# Patient Record
Sex: Female | Born: 1935 | Race: White | Hispanic: No | Smoking: Never smoker
Health system: Southern US, Community
[De-identification: ages and names within clinical notes are randomized; demographics above are authoritative.]

## PROBLEM LIST (undated history)

## (undated) DIAGNOSIS — K5792 Diverticulitis of intestine, part unspecified, without perforation or abscess without bleeding: Secondary | ICD-10-CM

## (undated) DIAGNOSIS — M1990 Unspecified osteoarthritis, unspecified site: Secondary | ICD-10-CM

## (undated) DIAGNOSIS — Z9889 Other specified postprocedural states: Secondary | ICD-10-CM

## (undated) DIAGNOSIS — C801 Malignant (primary) neoplasm, unspecified: Secondary | ICD-10-CM

## (undated) DIAGNOSIS — F32A Depression, unspecified: Secondary | ICD-10-CM

## (undated) DIAGNOSIS — I639 Cerebral infarction, unspecified: Secondary | ICD-10-CM

## (undated) DIAGNOSIS — D0510 Intraductal carcinoma in situ of unspecified breast: Secondary | ICD-10-CM

## (undated) DIAGNOSIS — E039 Hypothyroidism, unspecified: Secondary | ICD-10-CM

## (undated) DIAGNOSIS — K219 Gastro-esophageal reflux disease without esophagitis: Secondary | ICD-10-CM

## (undated) DIAGNOSIS — I10 Essential (primary) hypertension: Secondary | ICD-10-CM

## (undated) DIAGNOSIS — F419 Anxiety disorder, unspecified: Secondary | ICD-10-CM

## (undated) DIAGNOSIS — K589 Irritable bowel syndrome without diarrhea: Secondary | ICD-10-CM

## (undated) DIAGNOSIS — H9190 Unspecified hearing loss, unspecified ear: Secondary | ICD-10-CM

## (undated) DIAGNOSIS — H269 Unspecified cataract: Secondary | ICD-10-CM

## (undated) DIAGNOSIS — E785 Hyperlipidemia, unspecified: Secondary | ICD-10-CM

## (undated) DIAGNOSIS — R197 Diarrhea, unspecified: Secondary | ICD-10-CM

## (undated) DIAGNOSIS — J45909 Unspecified asthma, uncomplicated: Secondary | ICD-10-CM

## (undated) DIAGNOSIS — F329 Major depressive disorder, single episode, unspecified: Secondary | ICD-10-CM

## (undated) HISTORY — DX: Essential (primary) hypertension: I10

## (undated) HISTORY — PX: OTHER SURGICAL HISTORY: SHX169

## (undated) HISTORY — PX: COLECTOMY: SHX59

## (undated) HISTORY — DX: Irritable bowel syndrome, unspecified: K58.9

## (undated) HISTORY — DX: Other specified postprocedural states: Z98.890

## (undated) HISTORY — DX: Unspecified cataract: H26.9

## (undated) HISTORY — PX: JOINT REPLACEMENT: SHX530

## (undated) HISTORY — DX: Diverticulitis of intestine, part unspecified, without perforation or abscess without bleeding: K57.92

## (undated) HISTORY — DX: Unspecified asthma, uncomplicated: J45.909

## (undated) HISTORY — DX: Diarrhea, unspecified: R19.7

## (undated) HISTORY — PX: CHOLECYSTECTOMY: SHX55

## (undated) HISTORY — PX: KNEE SURGERY: SHX244

## (undated) HISTORY — DX: Intraductal carcinoma in situ of unspecified breast: D05.10

## (undated) HISTORY — DX: Hyperlipidemia, unspecified: E78.5

## (undated) HISTORY — DX: Gastro-esophageal reflux disease without esophagitis: K21.9

---

## 1981-05-29 HISTORY — PX: ABDOMINAL HYSTERECTOMY: SHX81

## 2000-09-11 ENCOUNTER — Encounter: Payer: Self-pay | Admitting: Internal Medicine

## 2000-09-11 ENCOUNTER — Ambulatory Visit (HOSPITAL_COMMUNITY): Admission: RE | Admit: 2000-09-11 | Discharge: 2000-09-11 | Payer: Self-pay | Admitting: Internal Medicine

## 2000-11-21 ENCOUNTER — Encounter: Payer: Self-pay | Admitting: Neurosurgery

## 2000-11-21 ENCOUNTER — Encounter: Admission: RE | Admit: 2000-11-21 | Discharge: 2000-11-21 | Payer: Self-pay | Admitting: Neurosurgery

## 2000-12-06 ENCOUNTER — Encounter: Admission: RE | Admit: 2000-12-06 | Discharge: 2000-12-06 | Payer: Self-pay | Admitting: Neurosurgery

## 2000-12-06 ENCOUNTER — Encounter: Payer: Self-pay | Admitting: Neurosurgery

## 2000-12-20 ENCOUNTER — Encounter: Payer: Self-pay | Admitting: Neurosurgery

## 2000-12-20 ENCOUNTER — Encounter: Admission: RE | Admit: 2000-12-20 | Discharge: 2000-12-20 | Payer: Self-pay | Admitting: Neurosurgery

## 2001-01-26 ENCOUNTER — Emergency Department (HOSPITAL_COMMUNITY): Admission: EM | Admit: 2001-01-26 | Discharge: 2001-01-26 | Payer: Self-pay | Admitting: Emergency Medicine

## 2001-01-26 ENCOUNTER — Encounter: Payer: Self-pay | Admitting: Emergency Medicine

## 2001-02-01 ENCOUNTER — Encounter: Admission: RE | Admit: 2001-02-01 | Discharge: 2001-02-01 | Payer: Self-pay | Admitting: Neurosurgery

## 2001-02-01 ENCOUNTER — Encounter: Payer: Self-pay | Admitting: Neurosurgery

## 2001-02-25 ENCOUNTER — Encounter: Payer: Self-pay | Admitting: Internal Medicine

## 2001-02-25 ENCOUNTER — Ambulatory Visit (HOSPITAL_COMMUNITY): Admission: RE | Admit: 2001-02-25 | Discharge: 2001-02-25 | Payer: Self-pay | Admitting: Internal Medicine

## 2001-03-06 ENCOUNTER — Observation Stay (HOSPITAL_COMMUNITY): Admission: RE | Admit: 2001-03-06 | Discharge: 2001-03-07 | Payer: Self-pay | Admitting: General Surgery

## 2001-03-28 ENCOUNTER — Encounter: Admission: RE | Admit: 2001-03-28 | Discharge: 2001-06-26 | Payer: Self-pay | Admitting: Internal Medicine

## 2001-05-29 DIAGNOSIS — Z9889 Other specified postprocedural states: Secondary | ICD-10-CM

## 2001-05-29 HISTORY — DX: Other specified postprocedural states: Z98.890

## 2001-09-11 ENCOUNTER — Ambulatory Visit (HOSPITAL_COMMUNITY): Admission: RE | Admit: 2001-09-11 | Discharge: 2001-09-11 | Payer: Self-pay | Admitting: Internal Medicine

## 2001-09-11 HISTORY — PX: COLONOSCOPY: SHX174

## 2001-09-25 ENCOUNTER — Ambulatory Visit (HOSPITAL_COMMUNITY): Admission: RE | Admit: 2001-09-25 | Discharge: 2001-09-25 | Payer: Self-pay | Admitting: Internal Medicine

## 2001-09-25 ENCOUNTER — Encounter: Payer: Self-pay | Admitting: Internal Medicine

## 2001-11-22 ENCOUNTER — Encounter: Payer: Self-pay | Admitting: Internal Medicine

## 2001-11-22 ENCOUNTER — Ambulatory Visit (HOSPITAL_COMMUNITY): Admission: RE | Admit: 2001-11-22 | Discharge: 2001-11-22 | Payer: Self-pay | Admitting: Internal Medicine

## 2002-01-03 ENCOUNTER — Encounter: Admission: RE | Admit: 2002-01-03 | Discharge: 2002-01-03 | Payer: Self-pay | Admitting: Internal Medicine

## 2002-01-03 ENCOUNTER — Encounter: Payer: Self-pay | Admitting: Internal Medicine

## 2002-01-24 ENCOUNTER — Ambulatory Visit (HOSPITAL_COMMUNITY): Admission: RE | Admit: 2002-01-24 | Discharge: 2002-01-24 | Payer: Self-pay | Admitting: Neurosurgery

## 2002-01-24 ENCOUNTER — Encounter: Payer: Self-pay | Admitting: Neurosurgery

## 2002-03-28 ENCOUNTER — Ambulatory Visit (HOSPITAL_COMMUNITY): Admission: RE | Admit: 2002-03-28 | Discharge: 2002-03-28 | Payer: Self-pay | Admitting: Internal Medicine

## 2002-03-28 ENCOUNTER — Encounter: Payer: Self-pay | Admitting: Internal Medicine

## 2002-05-29 HISTORY — PX: KNEE ARTHROPLASTY: SHX992

## 2002-09-11 ENCOUNTER — Encounter (HOSPITAL_COMMUNITY): Admission: RE | Admit: 2002-09-11 | Discharge: 2002-10-11 | Payer: Self-pay | Admitting: Internal Medicine

## 2002-09-11 ENCOUNTER — Encounter: Payer: Self-pay | Admitting: Internal Medicine

## 2002-10-06 ENCOUNTER — Encounter: Payer: Self-pay | Admitting: Internal Medicine

## 2002-12-25 ENCOUNTER — Ambulatory Visit (HOSPITAL_COMMUNITY): Admission: RE | Admit: 2002-12-25 | Discharge: 2002-12-25 | Payer: Self-pay | Admitting: Internal Medicine

## 2002-12-25 ENCOUNTER — Encounter: Payer: Self-pay | Admitting: Internal Medicine

## 2003-10-08 ENCOUNTER — Ambulatory Visit (HOSPITAL_COMMUNITY): Admission: RE | Admit: 2003-10-08 | Discharge: 2003-10-08 | Payer: Self-pay | Admitting: Internal Medicine

## 2003-11-04 ENCOUNTER — Ambulatory Visit (HOSPITAL_COMMUNITY): Admission: RE | Admit: 2003-11-04 | Discharge: 2003-11-04 | Payer: Self-pay | Admitting: Orthopedic Surgery

## 2003-11-04 ENCOUNTER — Ambulatory Visit (HOSPITAL_BASED_OUTPATIENT_CLINIC_OR_DEPARTMENT_OTHER): Admission: RE | Admit: 2003-11-04 | Discharge: 2003-11-04 | Payer: Self-pay | Admitting: Orthopedic Surgery

## 2004-06-12 ENCOUNTER — Emergency Department (HOSPITAL_COMMUNITY): Admission: EM | Admit: 2004-06-12 | Discharge: 2004-06-12 | Payer: Self-pay | Admitting: *Deleted

## 2004-10-10 ENCOUNTER — Ambulatory Visit (HOSPITAL_COMMUNITY): Admission: RE | Admit: 2004-10-10 | Discharge: 2004-10-10 | Payer: Self-pay | Admitting: Internal Medicine

## 2004-11-28 ENCOUNTER — Encounter: Admission: RE | Admit: 2004-11-28 | Discharge: 2004-11-28 | Payer: Self-pay | Admitting: Orthopedic Surgery

## 2005-01-17 ENCOUNTER — Ambulatory Visit (HOSPITAL_COMMUNITY): Admission: RE | Admit: 2005-01-17 | Discharge: 2005-01-17 | Payer: Self-pay | Admitting: Internal Medicine

## 2005-02-07 ENCOUNTER — Ambulatory Visit (HOSPITAL_COMMUNITY): Admission: RE | Admit: 2005-02-07 | Discharge: 2005-02-07 | Payer: Self-pay | Admitting: Internal Medicine

## 2005-10-03 ENCOUNTER — Ambulatory Visit (HOSPITAL_COMMUNITY): Admission: RE | Admit: 2005-10-03 | Discharge: 2005-10-03 | Payer: Self-pay | Admitting: Neurosurgery

## 2005-10-17 ENCOUNTER — Ambulatory Visit (HOSPITAL_COMMUNITY): Admission: RE | Admit: 2005-10-17 | Discharge: 2005-10-17 | Payer: Self-pay | Admitting: Internal Medicine

## 2005-11-16 ENCOUNTER — Encounter: Admission: RE | Admit: 2005-11-16 | Discharge: 2005-11-16 | Payer: Self-pay | Admitting: Neurosurgery

## 2005-12-08 ENCOUNTER — Encounter: Admission: RE | Admit: 2005-12-08 | Discharge: 2005-12-08 | Payer: Self-pay | Admitting: Neurosurgery

## 2006-01-02 ENCOUNTER — Encounter: Admission: RE | Admit: 2006-01-02 | Discharge: 2006-01-02 | Payer: Self-pay | Admitting: Neurosurgery

## 2006-01-31 ENCOUNTER — Encounter: Admission: RE | Admit: 2006-01-31 | Discharge: 2006-01-31 | Payer: Self-pay | Admitting: Orthopedic Surgery

## 2006-02-13 ENCOUNTER — Encounter (HOSPITAL_COMMUNITY): Admission: RE | Admit: 2006-02-13 | Discharge: 2006-02-23 | Payer: Self-pay | Admitting: Orthopedic Surgery

## 2006-02-26 ENCOUNTER — Encounter (HOSPITAL_COMMUNITY): Admission: RE | Admit: 2006-02-26 | Discharge: 2006-03-28 | Payer: Self-pay | Admitting: Orthopedic Surgery

## 2006-03-29 ENCOUNTER — Encounter (HOSPITAL_COMMUNITY): Admission: RE | Admit: 2006-03-29 | Discharge: 2006-04-28 | Payer: Self-pay | Admitting: Orthopedic Surgery

## 2006-08-10 ENCOUNTER — Observation Stay (HOSPITAL_COMMUNITY): Admission: AD | Admit: 2006-08-10 | Discharge: 2006-08-11 | Payer: Self-pay | Admitting: Internal Medicine

## 2006-08-10 DIAGNOSIS — I639 Cerebral infarction, unspecified: Secondary | ICD-10-CM

## 2006-08-10 HISTORY — DX: Cerebral infarction, unspecified: I63.9

## 2006-08-15 ENCOUNTER — Ambulatory Visit (HOSPITAL_COMMUNITY): Admission: RE | Admit: 2006-08-15 | Discharge: 2006-08-15 | Payer: Self-pay | Admitting: Internal Medicine

## 2006-08-15 ENCOUNTER — Ambulatory Visit: Payer: Self-pay | Admitting: Cardiology

## 2006-08-27 ENCOUNTER — Ambulatory Visit (HOSPITAL_COMMUNITY): Admission: RE | Admit: 2006-08-27 | Discharge: 2006-08-27 | Payer: Self-pay | Admitting: Internal Medicine

## 2006-09-05 ENCOUNTER — Ambulatory Visit (HOSPITAL_COMMUNITY): Admission: RE | Admit: 2006-09-05 | Discharge: 2006-09-05 | Payer: Self-pay | Admitting: Internal Medicine

## 2006-09-11 ENCOUNTER — Encounter (HOSPITAL_COMMUNITY): Admission: RE | Admit: 2006-09-11 | Discharge: 2006-10-11 | Payer: Self-pay | Admitting: Internal Medicine

## 2006-10-19 ENCOUNTER — Ambulatory Visit (HOSPITAL_COMMUNITY): Admission: RE | Admit: 2006-10-19 | Discharge: 2006-10-19 | Payer: Self-pay | Admitting: Internal Medicine

## 2007-03-19 ENCOUNTER — Ambulatory Visit (HOSPITAL_COMMUNITY): Admission: RE | Admit: 2007-03-19 | Discharge: 2007-03-19 | Payer: Self-pay | Admitting: Internal Medicine

## 2007-07-12 ENCOUNTER — Encounter: Admission: RE | Admit: 2007-07-12 | Discharge: 2007-07-12 | Payer: Self-pay | Admitting: Neurosurgery

## 2007-10-23 ENCOUNTER — Ambulatory Visit (HOSPITAL_COMMUNITY): Admission: RE | Admit: 2007-10-23 | Discharge: 2007-10-23 | Payer: Self-pay | Admitting: Internal Medicine

## 2008-06-04 ENCOUNTER — Ambulatory Visit (HOSPITAL_COMMUNITY): Admission: RE | Admit: 2008-06-04 | Discharge: 2008-06-04 | Payer: Self-pay | Admitting: Ophthalmology

## 2008-10-20 ENCOUNTER — Encounter: Admission: RE | Admit: 2008-10-20 | Discharge: 2008-10-20 | Payer: Self-pay | Admitting: Orthopedic Surgery

## 2008-10-22 ENCOUNTER — Ambulatory Visit (HOSPITAL_BASED_OUTPATIENT_CLINIC_OR_DEPARTMENT_OTHER): Admission: RE | Admit: 2008-10-22 | Discharge: 2008-10-22 | Payer: Self-pay | Admitting: Orthopedic Surgery

## 2008-10-23 ENCOUNTER — Ambulatory Visit (HOSPITAL_COMMUNITY): Admission: RE | Admit: 2008-10-23 | Discharge: 2008-10-23 | Payer: Self-pay | Admitting: Internal Medicine

## 2009-08-01 ENCOUNTER — Emergency Department (HOSPITAL_COMMUNITY): Admission: EM | Admit: 2009-08-01 | Discharge: 2009-08-01 | Payer: Self-pay | Admitting: Emergency Medicine

## 2009-08-24 ENCOUNTER — Ambulatory Visit (HOSPITAL_COMMUNITY): Admission: RE | Admit: 2009-08-24 | Discharge: 2009-08-24 | Payer: Self-pay | Admitting: Internal Medicine

## 2009-10-26 ENCOUNTER — Ambulatory Visit (HOSPITAL_COMMUNITY): Admission: RE | Admit: 2009-10-26 | Discharge: 2009-10-26 | Payer: Self-pay | Admitting: Internal Medicine

## 2010-05-29 DIAGNOSIS — Z9889 Other specified postprocedural states: Secondary | ICD-10-CM

## 2010-05-29 HISTORY — DX: Other specified postprocedural states: Z98.890

## 2010-06-19 ENCOUNTER — Encounter: Payer: Self-pay | Admitting: Internal Medicine

## 2010-08-19 LAB — COMPREHENSIVE METABOLIC PANEL
ALT: 25 U/L (ref 0–35)
Albumin: 4 g/dL (ref 3.5–5.2)
Alkaline Phosphatase: 66 U/L (ref 39–117)
Calcium: 9.2 mg/dL (ref 8.4–10.5)
GFR calc Af Amer: >60 mL/min (ref >60)
Potassium: 3.9 mEq/L (ref 3.5–5.1)
Sodium: 140 mEq/L (ref 135–145)
Total Protein: 6.6 g/dL (ref 6.0–8.3)

## 2010-08-19 LAB — CBC
Platelets: 423 10*3/uL — ABNORMAL HIGH (ref 150–400)
RDW: 13.3 % (ref 11.5–15.5)

## 2010-08-19 LAB — DIFFERENTIAL
Basophils Relative: 0 % (ref 0–1)
Eosinophils Absolute: 0.1 10*3/uL (ref 0.0–0.7)
Lymphs Abs: 2.3 10*3/uL (ref 0.7–4.0)
Monocytes Absolute: 0.9 10*3/uL (ref 0.1–1.0)
Monocytes Relative: 12 % (ref 3–12)

## 2010-09-06 LAB — URINALYSIS, ROUTINE W REFLEX MICROSCOPIC
Glucose, UA: NEGATIVE mg/dL
Hgb urine dipstick: NEGATIVE
Protein, ur: NEGATIVE mg/dL
Specific Gravity, Urine: 1.015 (ref 1.005–1.030)
pH: 5.5 (ref 5.0–8.0)

## 2010-09-06 LAB — HEMOGLOBIN A1C
Hgb A1c MFr Bld: 5.7 % (ref 4.6–6.1)
Mean Plasma Glucose: 117 mg/dL

## 2010-09-06 LAB — COMPREHENSIVE METABOLIC PANEL
ALT: 21 U/L (ref 0–35)
CO2: 28 mEq/L (ref 19–32)
Calcium: 9.7 mg/dL (ref 8.4–10.5)
Creatinine, Ser: 1.02 mg/dL (ref 0.4–1.2)
GFR calc Af Amer: >60 mL/min (ref >60)
GFR calc non Af Amer: 53 mL/min — ABNORMAL LOW (ref >60)
Glucose, Bld: 109 mg/dL — ABNORMAL HIGH (ref 70–99)
Sodium: 139 mEq/L (ref 135–145)
Total Protein: 7.1 g/dL (ref 6.0–8.3)

## 2010-09-06 LAB — MICROALBUMIN, URINE: Microalb, Ur: 1.94 mg/dL — ABNORMAL HIGH (ref 0.00–1.89)

## 2010-09-06 LAB — LIPID PANEL
Cholesterol: 175 mg/dL (ref 0–200)
HDL: 60 mg/dL (ref >39)

## 2010-09-06 LAB — TSH: TSH: 1.961 u[IU]/mL (ref 0.350–4.500)

## 2010-09-06 LAB — CBC
Hemoglobin: 13.4 g/dL (ref 12.0–15.0)
MCHC: 33.8 g/dL (ref 30.0–36.0)
MCV: 89.4 fL (ref 78.0–100.0)
RDW: 13.2 % (ref 11.5–15.5)

## 2010-09-06 LAB — POCT HEMOGLOBIN-HEMACUE: Hemoglobin: 13.7 g/dL (ref 12.0–15.0)

## 2010-09-12 LAB — BASIC METABOLIC PANEL WITH GFR
BUN: 19 mg/dL (ref 6–23)
CO2: 22 meq/L (ref 19–32)
Calcium: 9.4 mg/dL (ref 8.4–10.5)
Chloride: 106 meq/L (ref 96–112)
Creatinine, Ser: 0.95 mg/dL (ref 0.4–1.2)
GFR calc non Af Amer: 58 mL/min — ABNORMAL LOW
Glucose, Bld: 185 mg/dL — ABNORMAL HIGH (ref 70–99)
Potassium: 4.3 meq/L (ref 3.5–5.1)
Sodium: 135 meq/L (ref 135–145)

## 2010-09-12 LAB — GLUCOSE, CAPILLARY: Glucose-Capillary: 93 mg/dL (ref 70–99)

## 2010-09-12 LAB — HEMOGLOBIN AND HEMATOCRIT, BLOOD
HCT: 38.3 % (ref 36.0–46.0)
Hemoglobin: 12.7 g/dL (ref 12.0–15.0)

## 2010-09-27 ENCOUNTER — Encounter: Payer: Self-pay | Admitting: Gastroenterology

## 2010-09-27 ENCOUNTER — Ambulatory Visit (INDEPENDENT_AMBULATORY_CARE_PROVIDER_SITE_OTHER): Payer: Medicare Other | Admitting: Gastroenterology

## 2010-09-27 VITALS — BP 123/69 | HR 73 | Temp 97.5°F | Ht 60.0 in | Wt 113.6 lb

## 2010-09-27 DIAGNOSIS — R1013 Epigastric pain: Secondary | ICD-10-CM

## 2010-09-27 DIAGNOSIS — R197 Diarrhea, unspecified: Secondary | ICD-10-CM

## 2010-09-27 DIAGNOSIS — R1110 Vomiting, unspecified: Secondary | ICD-10-CM

## 2010-09-27 MED ORDER — DEXLANSOPRAZOLE 60 MG PO CPDR: 60.0000 mg | DELAYED_RELEASE_CAPSULE | Freq: Every day | ORAL | Status: DC

## 2010-09-27 NOTE — Progress Notes (Signed)
Referring Provider: Dr. Ouida Sills Primary Care Physician:  [name], MD Primary Gastroenterologist:  Dr. Jena Gauss  Chief Complaint  Patient presents with  . Diarrhea    HPI:  Angela Galvan is a 77 y.o. female here as a referral from Dr. Ouida Sills secondary to diarrhea. She actually was last seen by our office in 2004. She has a hx of chronic diarrhea, with a thorough work-up in 2004 to include evaluation via colonoscopy, celiac panel, stool studies, and even SBFT to assess for Crohn's ileitis.  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 Did not tolerate mesalamine enemas. The only thing that has really helped in the past was Librax. She was referred to [facility] ultimately after this extensive work-up, and no further recommendations were reported. It was believed she had diarrhea predominant IBS.  Presents today with bouts of diarrhea. Normally goes once or twice/day, yet when has episodes, 7-8 loose stools. Described as watery. No abdominal pain. Has been experiencing epigastric pain only with eating for a few weeks. +emesis, then feels better. No dysphagia. Almost takes breath away. No BC or Goodys. No NSAIDs. Does take ASA 325 daily.  No loss of appetite, but doesn't eat a lot. NO reflux. Stool samples done at Dr. Alonza Smoker office: lactoferrin positive, no ova or parasites. Do not have other results as of today.    Past Medical History  Diagnosis Date  . Diverticulitis     s/p colectomy in 1991  . IBS (irritable bowel syndrome)   . HTN (hypertension)   . Hyperlipidemia   . S/P colonoscopy 2003    minimal internal hemorrhoids, pancolonic diverticula, biopsy of rectum: lymphoplasmacytic microscopic colitis, colon biopsies normal     Past Surgical History  Procedure Date  . Colectomy   . Cholecystectomy   . Right ovarian tumor removed as teenager   . Abdominal hysterectomy   . Knee surgery   . Bilateral cataracts      Current Outpatient Prescriptions  Medication Sig Dispense Refill  . ALPRAZolam (XANAX) 0.5 MG tablet Take 0.5 mg by mouth at bedtime as needed.        Marland Kitchen aspirin 325 MG tablet Take 325 mg by mouth daily.        Marland Kitchen atorvastatin (LIPITOR) 80 MG tablet Take 80 mg by mouth daily.        . chlordiazePOXIDE (LIBRIUM) 5 MG capsule Take 5 mg by mouth 3 (three) times daily as needed.        . citalopram (CELEXA) 40 MG tablet Take 40 mg by mouth daily.        . diphenoxylate-atropine (LOMOTIL) 2.5-0.025 MG/5ML liquid Take 5 mLs by mouth 4 (four) times daily as needed.        Marland Kitchen levothyroxine (SYNTHROID, LEVOTHROID) 50 MCG tablet Take 50 mcg by mouth daily.        Marland Kitchen losartan (COZAAR) 100 MG tablet Take 100 mg by mouth daily.        Marland Kitchen oxyCODONE-acetaminophen (PERCOCET) 5-325 MG per tablet Take 1 tablet by mouth every 4 (four) hours as needed.        . pregabalin (LYRICA) 75 MG capsule Take 75 mg by mouth 2 (two) times daily.        . traMADol (ULTRAM) 50 MG tablet Take 50 mg by mouth every 6 (six) hours as needed.        Marland Kitchen dexlansoprazole (DEXILANT) 60 MG capsule Take 1 capsule (60 mg total) by mouth daily.  30 capsule  1    Allergies as of 09/27/2010  . (No Known Allergies)    Family History  Problem Relation Age of Onset  . Colon cancer Neg Hx     History   Social History  . Marital Status: Married    Spouse Name: N/A    Number of Children: N/A  . Years of Education: N/A   Occupational History  . Not on file.   Social History Main Topics  . Smoking status: Never Smoker   . Smokeless tobacco: Not on file  . Alcohol Use: No  . Drug Use: No    Review of Systems: Gen: Denies any fever, chills, sweats, anorexia, fatigue, weakness, malaise, weight loss, and sleep disorder CV: Denies chest pain, angina, palpitations, syncope, orthopnea, PND, peripheral edema, and claudication. Resp: Denies dyspnea at rest, dyspnea with exercise, cough, sputum, wheezing, coughing up blood, and  pleurisy. GI: Denies vomiting blood, jaundice, and fecal incontinence.   Denies dysphagia or odynophagia. GU : Denies urinary burning, blood in urine, urinary frequency, urinary hesitancy, nocturnal urination, and urinary incontinence. MS: Denies joint pain, limitation of movement, and swelling, stiffness, low back pain, extremity pain. Denies muscle weakness, cramps, atrophy.  Derm: Denies rash, itching, dry skin, hives, moles, warts, or unhealing ulcers.  Psych: Denies depression, anxiety, memory loss, suicidal ideation, hallucinations, paranoia, and confusion. Heme: Denies bruising, bleeding, and enlarged lymph nodes.  Physical Exam: BP 123/69  Pulse 73  Temp(Src) 97.5 F (36.4 C) (Tympanic)  Ht 5' (1.524 m)  Wt 113 lb 9.6 oz (51.529 kg)  BMI 22.19 kg/m2 General:   Alert,  Well-developed, well-nourished, pleasant and cooperative in NAD Head:  Normocephalic and atraumatic. Eyes:  Sclera clear, no icterus.   Conjunctiva pink. Ears:  Normal auditory acuity. Nose:  No deformity, discharge,  or lesions. Mouth:  No deformity or lesions, dentition normal. Neck:  Supple; no masses or thyromegaly. Lungs:  Clear throughout to auscultation.   No wheezes, crackles, or rhonchi. No acute distress. Heart:  Regular rate and rhythm; no murmurs, clicks, rubs,  or gallops. Abdomen:  Soft, nontender and nondistended. No masses, hepatosplenomegaly or hernias noted. Normal bowel sounds, without guarding, and without rebound.   Rectal:  Deferred until time of colonoscopy.   Msk:  Symmetrical without gross deformities. Normal posture. Extremities:  Without clubbing or edema. Neurologic:  Alert and  oriented x4;  grossly normal neurologically. Skin:  Intact without significant lesions or rashes. Cervical Nodes:  No significant cervical adenopathy. Psych:  Alert and cooperative. Normal mood and affect.

## 2010-09-27 NOTE — Patient Instructions (Addendum)
Begin taking Dexilant 60 mg daily. This is for your upper abdominal discomfort.  Start taking a probiotic daily. You have been given samples of Align. This helps with gas, bloating.  You have been set up for an upper endoscopy and colonoscopy.   We have requested the rest of the stool studies from Dr. Ouida Sills. We may ask you to complete a few more if they did not perform them already. This should not postpone your colonoscopy.

## 2010-09-28 ENCOUNTER — Other Ambulatory Visit (HOSPITAL_COMMUNITY): Payer: Self-pay | Admitting: Internal Medicine

## 2010-09-28 DIAGNOSIS — Z139 Encounter for screening, unspecified: Secondary | ICD-10-CM

## 2010-09-29 ENCOUNTER — Encounter: Payer: Self-pay | Admitting: Gastroenterology

## 2010-09-29 DIAGNOSIS — R1013 Epigastric pain: Secondary | ICD-10-CM | POA: Insufficient documentation

## 2010-09-29 DIAGNOSIS — R197 Diarrhea, unspecified: Secondary | ICD-10-CM | POA: Insufficient documentation

## 2010-09-29 NOTE — Assessment & Plan Note (Addendum)
77 year old female with hx of chronic diarrhea, thoroughly evaluated in past. +hx of She has a hx of lymphoplasmacytic microscopic colitis on rectal biopsies, negative colon biopsies in 2003. Best relief with Librax in the past. Presenting with intermittent episodes of 7-8 loose stools between normal days of 1-2 BMs. No associated abdominal pain. Stool samples thus far received from Dr. Alonza Smoker office nl. Awaiting Cdiff. Highly doubt infectious etiology; likely pt has IBS-D, as this has been thoroughly evaluated in past. However, can't exclude other etiology at this point. Will evaluate with colonoscopy.   Proceed with TCS with Dr. Jena Gauss in near future: the risks, benefits, and alternatives have been discussed with the patient in detail. The patient states understanding and desires to proceed. Probiotic daily (Align samples provided) Further rec's to follow

## 2010-09-29 NOTE — Assessment & Plan Note (Addendum)
77 year old female with several week hx of epigastric pain aggravated by eating, only relieved with emesis. No dysphagia. Feels at times it "takes breath away". No BC, Goodys, NSAIDs. Does take ASA 325 daily. No loss of appetite or reflux. Differentials include gastritis, peptic ulcer disease, will start on Dexilant daily.   EGD with Dr. Jena Gauss in near future (at time of colonoscopy): the R/B/A have been discussed in detail. Pt states understanding and desires to proceed. Dexilant samples provided. Rx provided. Further rec's to follow

## 2010-09-30 NOTE — Progress Notes (Signed)
Cc to PCP 

## 2010-10-05 ENCOUNTER — Encounter: Payer: Self-pay | Admitting: Gastroenterology

## 2010-10-11 ENCOUNTER — Other Ambulatory Visit: Payer: Self-pay | Admitting: Internal Medicine

## 2010-10-11 ENCOUNTER — Ambulatory Visit (HOSPITAL_COMMUNITY)
Admission: RE | Admit: 2010-10-11 | Discharge: 2010-10-11 | Disposition: A | Discharge status: Home / Self Care | Payer: Medicare Other | Source: Ambulatory Visit | Attending: Internal Medicine | Admitting: Internal Medicine

## 2010-10-11 ENCOUNTER — Encounter: Payer: Medicare Other | Admitting: Internal Medicine

## 2010-10-11 DIAGNOSIS — K449 Diaphragmatic hernia without obstruction or gangrene: Secondary | ICD-10-CM | POA: Insufficient documentation

## 2010-10-11 DIAGNOSIS — I10 Essential (primary) hypertension: Secondary | ICD-10-CM | POA: Insufficient documentation

## 2010-10-11 DIAGNOSIS — K5730 Diverticulosis of large intestine without perforation or abscess without bleeding: Secondary | ICD-10-CM

## 2010-10-11 DIAGNOSIS — R197 Diarrhea, unspecified: Secondary | ICD-10-CM

## 2010-10-11 DIAGNOSIS — E785 Hyperlipidemia, unspecified: Secondary | ICD-10-CM | POA: Insufficient documentation

## 2010-10-11 DIAGNOSIS — K2940 Chronic atrophic gastritis without bleeding: Secondary | ICD-10-CM

## 2010-10-11 DIAGNOSIS — R1013 Epigastric pain: Secondary | ICD-10-CM | POA: Insufficient documentation

## 2010-10-11 DIAGNOSIS — Z7982 Long term (current) use of aspirin: Secondary | ICD-10-CM | POA: Insufficient documentation

## 2010-10-11 DIAGNOSIS — Z79899 Other long term (current) drug therapy: Secondary | ICD-10-CM | POA: Insufficient documentation

## 2010-10-11 DIAGNOSIS — K311 Adult hypertrophic pyloric stenosis: Secondary | ICD-10-CM | POA: Insufficient documentation

## 2010-10-11 HISTORY — PX: OTHER SURGICAL HISTORY: SHX169

## 2010-10-11 HISTORY — PX: ESOPHAGOGASTRODUODENOSCOPY: SHX1529

## 2010-10-11 NOTE — Op Note (Signed)
Angela Galvan, Angela Galvan                 ACCOUNT NO.:  [id number]   MEDICAL RECORD NO.:  [id number]          PATIENT TYPE:  AMB   LOCATION:  DSC                          FACILITY:  [facility]   PHYSICIAN:  Cindee Salt, M.D.       DATE OF BIRTH:  08-Feb-1933   DATE OF PROCEDURE:  10/22/2008  DATE OF DISCHARGE:                               OPERATIVE REPORT   PREOPERATIVE DIAGNOSIS:  Stenosing tenosynovitis, left thumb.   POSTOPERATIVE DIAGNOSIS:  Stenosing tenosynovitis, left thumb.   OPERATION:  Release A1 pulley, left thumb.   SURGEON:  Cindee Salt, MD   ANESTHESIA:  Forearm-based IV regional.   ANESTHESIOLOGIST:  Kaylyn Layer. Michelle Piper, MD   HISTORY:  The patient is a 77 year old female with a history of  triggering of her left thumb, this have locked.  She is desirous of  release.  Pre, peri, and postoperative course had been discussed along  with risks and complications.  She is aware that there is no guarantee  with surgery, possibility of infection, recurrence injury to arteries,  nerves, tendons, incomplete relief of symptoms, or dystrophy.  In the  preoperative area, the patient is seen.  The extremity marked by both  the patient and surgeon.  Antibiotic given.   PROCEDURE:  The patient was brought to the operating room where a  forearm-based IV regional anesthetic was carried out without difficulty  under traction of Dr. Michelle Piper.  She was prepped using ChloraPrep, supine  position, left arm free.  A 3-minute dry time was allowed.  A time-out  taken confirming the patient and procedure.  The patient was then  draped.  After adequate anesthesia was afforded to the patient, a  transverse incision was made over the A1 pulley of left thumb, carried  down through subcutaneous tissue, and bleeders were electrocauterized.  The A1 pulley was found be markedly tight with an area of medullization  of the FPL tendon just proximal to this in a locked major.  The A1  pulley was then released allowing  full flexion/extension of the finger.  This was done on its radial aspect protecting neurovascular bundles with  retractors.  Wound was copiously irrigated with saline.  The skin was  then closed with interrupted 5-0 Vicryl Rapide sutures.  The area  locally infiltrated with 0.25% Marcaine without epinephrine.  Sterile  compressive dressing was applied without a splint.  On deflation of the  tourniquet, all fingers immediately pinked.  She was taken to the  recovery room for observation in satisfactory condition.  She will be  discharged home.  To return to the [facility] in 1 week,  on Vicodin.           ______________________________  Cindee Salt, M.D.    GK/MEDQ  D:  10/22/2008  T:  10/23/2008  Job:  [id number]   cc:   Kingsley Callander. Ouida Sills, MD

## 2010-10-14 NOTE — Consult Note (Signed)
[facility]  Patient:    Angela Galvan, Angela Galvan. Visit Number: [id number] MRN: [id number]          Service Type: Attending:  [name], M.D. Dictated by:   Barbaraann Barthel, M.D. Adm. Date:  02/28/01   CC:         [name], M.D.  Barbaraann Barthel, M.D.   Consultation Report  Dear Channing Mutters:  Thank you kindly for sending Angela Galvan back my way.  As you know she has had some rather confusing right upper quadrant and upper abdominal pain since March 2002.  This has been confused because she has had some problems with some thoracic disk herniation which has confused the episode of her pain.  She does, on questioning, have right upper quadrant pain that is at times post prandial and appears to wake her up at night after eating and is accompanied with nausea.  Sonogram, as you know, revealed the presence of a polyp and sludge.   I believe that this is consistent with symptoms of cholecystitis and certainly I think she requires laparoscopic cholecystectomy for the above findings.  We will plan to do this at her convenience next week and I will, as always, make sure you get a copy of the surgical note and the pathology report for your records.  Again, thank you kindly for sending her back my way.  Yours sincerely,   Marlane Hatcher, M.D. Dictated by:   Barbaraann Barthel, M.D. Attending:  [name], M.D. DD:  02/28/01 TD:  03/01/01 Job: [id number]

## 2010-10-14 NOTE — Discharge Summary (Signed)
[name], Angela Galvan                 ACCOUNT NO.:  [id number]   MEDICAL RECORD NO.:  [id number]          PATIENT TYPE:  OBV   LOCATION:  A220                          FACILITY:  [facility]   PHYSICIAN:  Kingsley Callander. Ouida Sills, MD       DATE OF BIRTH:  Dec 04, 1932   DATE OF ADMISSION:  08/10/2006  DATE OF DISCHARGE:  03/15/2008LH                               DISCHARGE SUMMARY   DISCHARGE DIAGNOSES:  1. Transient ischemic attack.  2. Hypertension.  3. Diabetes.  4. Hyperlipidemia.  5. Hypothyroidism.   PROCEDURES:  Head CT.   HOSPITAL COURSE:  This patient is a 77 year old female who presented  with difficulties with balance.  She had no weakness or speech  abnormalities; vital signs were normal.  She was in normal sinus rhythm.  She was hospitalized for observation.  A CT scan of the brain revealed  no acute stroke.  Overnight her balance improved.  Her Romberg had  initially been unsteady but is normal at the time of discharge.  She is  still slightly unsteady with her gait.  It was recommended that she  remain in the hospital for observation, to obtain a followup CT scan in  48 hours.  However, she desired discharge with outpatient followup in 2  days.   Premarin and Celebrex are being stopped.  Aspirin is being increased to  325 mg a day.  She will continue verapamil and pravastatin.   Her glucoses were well controlled.  Her blood pressures were well  controlled.   She had a negative carotid ultrasound last August.   DISCHARGE MEDICATIONS:  1. Verapamil SR 180 mg daily.  2. Aspirin 325 mg daily.  3. Pravastatin 40 mg daily.  4. Cymbalta 60 mg daily.  5. Lomotil p.r.n.  6. Xanax 0.5 t.i.d. p.r.n.  7. Darvocet q. 6 p.r.n.      Kingsley Callander. Ouida Sills, MD  Electronically Signed     ROF/MEDQ  D:  08/11/2006  T:  08/11/2006  Job:  [id number]

## 2010-10-14 NOTE — H&P (Signed)
NAMEDENECIA, BRUNETTE                 ACCOUNT NO.:  [id number]   MEDICAL RECORD NO.:  [id number]          PATIENT TYPE:  OBV   LOCATION:  A220                          FACILITY:  [facility]   PHYSICIAN:  Kingsley Callander. Ouida Sills, MD       DATE OF BIRTH:  04/03/1933   DATE OF ADMISSION:  08/10/2006  DATE OF DISCHARGE:  LH                              HISTORY & PHYSICAL   CHIEF COMPLAINT:  Imbalance.   HISTORY OF THE PRESENT ILLNESS:  This patient is a 77 year old white  female who presented to the office approximately 4 hours after beginning  to feel drunk. She had felt fine when she went to the grocery store,  but while there began having, what she perceived to be, difficulty with  her vision and her balance.  She went to her eye doctor, as she had a  recent cataract procedure.  She was told there was no sign of an acute  problem with her eyes.  She then presented unexpectedly to the office,  where she was found to clearly be unsteady on her feet.  There was no  focal weakness though or change in speech.  She has been sent to the  hospital for a stat CT scan and observation.   PAST MEDICAL HISTORY:  1. Diabetes.  2. Hypertension.  3. Hyperlipidemia.  4. Right thoracic radiculopathy.  5. Hypothyroidism.  6. Depression.  7. Thrombocytosis.  8. Migraines.  9. IBS.  10.Osteoarthritis.  11.Hysterectomy and oophorectomy.  12.Cholecystectomy.  13.Laparotomy for diverticulitis.  14.Status post right knee surgery.  15.Status post recent cataract surgery.   MEDICATIONS:  1. Aspirin 81 mg daily.  2. Cymbalta 60 mg daily.  3. Synthroid 50 mcg daily.  4. Premarin 0.3 mg daily.  5. Verapamil SR 180 mg daily.  6. Librax approximately 1 week.  7. Ambien CR 6.25 mg approximately once a week.  8. Alprazolam 0.5 approximately 8 per month.  9. Darvocet approximately four per week.  10.Skelaxin 800 mg p.r.n.  11.Lomotil b.i.d.  12.Osteo-Biflex daily.  13.Vitamin C 500 mg daily.  14.Celebrex 200 mg  b.i.d.  15.Tylox less than four per month.  16.Amerge p.r.n.  17.Reglan p.r.n.  18.Vicodin p.r.n.   ALLERGIES:  None.   SOCIAL HISTORY:  She does not smoke cigarettes or drink alcohol.   FAMILY HISTORY:  Her father had diabetes and an MI.  Her mother had a  stroke and an MI.  Her twin sister has had a stroke.   REVIEW OF SYSTEMS:  No loss of consciousness, difficulty speaking.  She  was actually able to drive from the grocery store after she felt her  symptoms beginning.  No difficulty breathing, chest pain or problem with  her bowels or bladder.   PHYSICAL EXAM:  GENERAL:  Alert and fully oriented.  HEENT: Her eyes were dilated.  The face symmetric.  The tongue midline.  Gag was present.  LUNGS:  Clear.  HEART:  Tachycardic at 100 beats per minute.  ABDOMEN:  Nontender; no hepatosplenomegaly.  EXTREMITIES:  No clubbing or edema.  NEUROLOGIC:  No  focal weakness in the face, upper or lower extremities.  She is very unsteady on testing of her gait; she falls to the left.  Her  Romberg is unsteady.  Finger-to-nose testing is unsteady on both sides.   IMPRESSION:  1. STROKE.  She is being sent for a stat head CT scan, EKG CBC, PT/PTT      and cardiac monitoring.  Will hold Premarin and Celebrex.  Continue      verapamil and Pravastatin.  Will restart aspirin if there is no      sign of bleeding.  2. HYPERTENSION.  3. HYPERLIPIDEMIA.  4. DIABETES.  Well-controlled dietary therapy.  Her last A1c was 5.9      in November.  5. CHRONIC RIGHT THORACIC RADICULOPATHY.  6. HYPOTHYROIDISM.  Continue Synthroid.  7. MIGRAINE HEADACHES.      Kingsley Callander. Ouida Sills, MD  Electronically Signed     ROF/MEDQ  D:  08/10/2006  T:  08/10/2006  Job:  [id number]

## 2010-10-14 NOTE — Op Note (Signed)
NAME:  Angela Galvan, Angela Galvan                           ACCOUNT NO.:  [id number]   MEDICAL RECORD NO.:  [id number]                   PATIENT TYPE:  AMB   LOCATION:  DSC                                  FACILITY:  [facility]   PHYSICIAN:  Leonides Grills, M.D.                  DATE OF BIRTH:  [birth year]   DATE OF PROCEDURE:  11/04/2003  DATE OF DISCHARGE:                                 OPERATIVE REPORT   PREOPERATIVE DIAGNOSIS:  Right second tarsometatarsal joint arthritis.   POSTOPERATIVE DIAGNOSIS:  Right second tarsometatarsal joint arthritis.   OPERATION:  1. Right second tarsometatarsal joint fusion.  2. Right local bone graft.  3. Stress x-rays right foot.   ANESTHESIA:  Popliteal block with sedation.   SURGEON:  Leonides Grills, M.D.   ASSISTANT:  Lianne Cure, P.A.   ESTIMATED BLOOD LOSS:  Minimal.   TOURNIQUET TIME:  Approximately 40 minutes.   COMPLICATIONS:  None.   DISPOSITION:  Stable to the PRU.   INDICATIONS:  This is a 77 year old female who has had longstanding medial  midfoot pain that was interfering with her life to the point that she could  not do what she wants to.  She was found to have an isolated right second  tarsometatarsal joint arthritis.  She was consented for the above procedure.  All risks, which include infection, nerve or vessel injury, non-union,  malunion, hardware __________  and hardware failure, persistent pain,  worsening pain, stiffness, arthritis in neighboring joints were all  explained.  Questions were encouraged and answered.   OPERATION:  The patient was brought to the operating room and placed in the  supine position.  After she had popliteal block with sedation administered  as well as Ancef 1 gm IV piggyback, the right lower extremity was then  prepped and draped in a sterile manner over a proximally placed thigh  tourniquet.  The limb was gravity exsanguinated and tourniquet was inflated  to 290 mmHg.  A longitudinal incision just  lateral to the EHL tendon was  then made.  Dissection was carried down through skin.  The EHL tendon was  protected as well as its respective sheath. TMT joint was then entered.  This was then verified under C-arm guidance in the AP and oblique plane.  At  this point, we denuded the remaining cartilage of the joint with a curved  __________  osteotome with a curet.  Once this was completely denuded,  multiple 2-mm drill holes were placed on either side of the joint.  This was  then compressed with a 2-point Weber clamp and through a percutaneous  incision medially under C-arm guidance a 3.5-mm fully threaded cortical set  screw using a 2.5-mm drill hole respectively was then made.  This had  excellent purchase and maintenance of the compression across the arthrodesis  site.  The wound was copiously irrigated with normal saline.  Stress x-rays  were obtained in AP, lateral, and oblique planes, and showed stable  construct.  No evidence of motion through this area.  Fixation was in the  proper position.  Bone graft was obtained locally from the spur as well as  from drill bits.  They were placed on a back table throughout the case.  With a bur, a bur hole stress-relieving graft site was then made and graft  was then packed into place.  Local bone graft was packed in place after the  wound was copiously irrigated with normal saline.  Tourniquet was inflated.  Hemostasis was obtained.  Palpable dorsalis pedis pulse.  Skin was closed  with 4-0 nylon suture.  Over all wounds sterile dressing was applied.  Modified Jones dressing was applied with the ankle in loose dorsiflexion.  The patient was stable to the PRU.                                               Leonides Grills, M.D.    PB/MEDQ  D:  11/04/2003  T:  11/04/2003  Job:  [id number]

## 2010-10-14 NOTE — Procedures (Signed)
NAMELISVET, RASHEED                 ACCOUNT NO.:  [id number]   MEDICAL RECORD NO.:  [id number]          PATIENT TYPE:  OUT   LOCATION:  RAD                           FACILITY:  [facility]   PHYSICIAN:  Gerrit Friends. Dietrich Pates, MD, FACCDATE OF BIRTH:  07/18/1932   DATE OF PROCEDURE:  08/15/2006  DATE OF DISCHARGE:                                ECHOCARDIOGRAM   PROCEDURE:  Echocardiogram.   CLINICAL DATA:  A 77 year old woman with TIA, diabetes, and  hypertension.   M-mode:  Aorta 2.4.  Left atrium 3.1.  Septum 0.9.  Posterior wall 0.9.  LV diastole 3.8.  LV systole 2.7.  1. A technically adequate echocardiographic study.  2. Normal left atrium, right atrium, and right ventricle.  3. Normal aortic, mitral, tricuspid, and pulmonic valves; mild mitral      annular calcification; trace aortic insufficiency.  4. Normal proximal pulmonary artery.  5. Normal internal dimension, wall thickness, regional and global      function of the left ventricle.  6. Normal IVC.      Gerrit Friends. Dietrich Pates, MD, [facility]  Electronically Signed     RMR/MEDQ  D:  08/16/2006  T:  08/16/2006  Job:  [id number]

## 2010-10-14 NOTE — Op Note (Signed)
[facility]  Patient:    Angela Galvan, Angela Galvan Visit Number: [id number] MRN: [id number]          Service Type: END Location: DAY Attending Physician:  Jonathon Bellows Dictated by:   [name], M.D. Proc. Date: 09/11/01 Admit Date:  09/11/2001   CC:         [name], M.D.   Operative Report  PROCEDURE:  Colonoscopy with biopsy and stool suctioning.  INDICATIONS:  The patient is a 77 year old lady referred by Dr. Ouida Sills for the evaluation of diarrhea.  She reports having diarrhea since 1982.  Her symptoms have worsened significantly since undergoing a cholecystectomy by Dr. Malvin Johns last fall.  She was found to have sludge cholecystitis.  On a good day, she has 3 to 4 bowel movements; on a bad day, she has 10+ nonbloody loose stools.  She has not lost any weight; she has no abdominal pain, no family history of colorectal neoplasia.  She is status post segmental resection years ago for diverticulitis.  Colonoscopy is now being done to further evaluate her diarrhea.  This approach has been discussed with [name] at length at the bedside.  Potential risks, benefits, and alternatives have been reviewed with questions answered.  Please see my handwritten H&P for more information.  I feel she is at low risk for conscious sedation with Versed and Demerol.  GASTROENTEROLOGIST:  [name], M.D.  PROCEDURE NOTE:  The patient was placed in the left lateral decubitus position.  O2 saturation, blood pressure, pulses of this patient were monitored throughout the entire procedure.  CONSCIOUS SEDATION:  Versed 3 mg IV, Demerol 5 mg IV in divided doses.  INSTRUMENT: Olympus video chip colonoscope.  FINDINGS:  Digital rectal examination revealed no abnormalities.  ENDOSCOPIC FINDINGS:  Prep was good.  Rectum:  Examination of rectal mucosa including retroflexed view of the anal verge revealed only minimal internal hemorrhoids.  COLON:  Colonic mucosa was  surveyed from the rectosigmoid junction through the left transverse right colon to the area of the appendiceal orifice, ileocecal valve, and cecum.  These structures were well seen and photographed for the record.   The patient had scattered pancolonic diverticula; they were few in number.  I was unable to identify a surgical anastomosis.  The colonic mucosa otherwise appeared normal.  I was unable to intubate the terminal ileum.  From the level of the cecum and ileocecal vale, the scope was slowly and cautiously withdrawn.  All previously mentioned mucosal surfaces were again seen.  No other abnormalities were observed.  Segmental biopsies of the descending sigmoid and rectum were taken for further evaluation for possible microscopic colitis.  Also, stool residue was suctioned for microbiology studies.  The patient tolerated the procedure well and was reacted in endoscopy.  IMPRESSION: 1. Minimal internal hemorrhoids, otherwise normal rectum. 2. Scattered pancolonic diverticula (few). 3. The colonic mucosa otherwise appeared normal status post biopsies    and stool sampling as described above.  RECOMMENDATIONS: 1. Follow up on biopsies and stool sampling. 2. Would consider screening her for celiac disease depending on the    findings of the above. 3. Further recommendations to follow.  RECOMMENDATIONS: Dictated by:   [name], M.D. Attending Physician:  Jonathon Bellows DD:  09/11/01 TD:  09/12/01 Job: [id number]

## 2010-10-14 NOTE — Op Note (Signed)
[facility]  Patient:    Angela Galvan, Angela Galvan Visit Number: [id number] MRN: [id number]          Service Type: DSU Location: 3 A302 01 Attending Physician:  Barbaraann Barthel Dictated by:   Barbaraann Barthel, M.D. Proc. Date: 03/06/01 Admit Date:  03/06/2001   CC:         [name], M.D.  Christin Bach, M.D.   Operative Report  SURGEON:  Barbaraann Barthel, M.D.  ASSISTANT:  Christin Bach, M.D.  PREOPERATIVE DIAGNOSIS:  Cholecystitis secondary to polyp and sludge within the gallbladder.  POSTOPERATIVE DIAGNOSIS:  Cholecystitis secondary to polyp and sludge within the gallbladder.  PROCEDURE:  Laparoscopic cholecystectomy.  NOTE:  This is a 77 year old white female patient of Dr. Blanca Friend, who presented with multiple episodes of postprandial right upper quadrant pain and nausea.  Workup had shown her to have liver function studies and amylase which were within normal limits; a sonogram which was abnormal showing sludge and a polyp within the gallbladder.  She was seen preoperatively for evaluation.  I recommended laparoscopic cholecystectomy.  We discussed the procedure in detail including, but not limiting, discussion of risks to include bleeding, infection, damage to bile ducts, perforation of organs and transitory diarrhea.  Informed consent was obtained.  GROSS OPERATIVE FINDINGS:  The patient had some slight edema of the neck of the gallbladder and multiple adhesions around the gallbladder, small cystic duct which was not cannulated for a cholangiogram.  The patient had dense adhesions which required [place] to begin the procedure in the epigastric and then later take down the adhesions around the umbilicus, as this patient had undergone a previous sigmoid resection by myself in the past.  These adhesions were lysed without problem.  There were no other abnormalities grossly noted in the right upper quadrant.  TECHNIQUE:  The patient was placed in  the supine position and after the adequate administration of general anesthesia via endotracheal intubation, her entire abdomen was prepped with Betadine solution and draped in the usual manner.  Prior to this, a Foley catheter was aseptically inserted.  I made an incision in the epigastrium and grasped the fascia with a sharp towel clip and inserted the Veress needle, which was confirmed in position with a saline drop test.  The abdomen was then insufflated to approximately 3.8 L of CO2 and then an 11-mm [place] Surgical cannula was placed within this epigastric incision.  Then under direct vision, the two 5-mm cannulae were placed in the right upper quadrant laterally.  Using the Endo Shears and cautery, the adhesions were taken down meticulously around the umbilicus in order to free up an area for the camera port in this site.  These adhesions were taken down; these emanated from her previous sigmoid resection.  Once this was done, the camera was then transferred from the epigastric cannula to the 11-mm cannula site in the umbilicus.  The gallbladder was then grasped; its adhesions were taken down. The gallbladder was quite distended and this necessitated decompressing it with a needle aspiration of about 30 cc of bilious fluid.  The gallbladder was then grasped and its adhesions were taken down.  The cystic duct was triply silver-clipped on the side of the common bile duct and singly silver-clipped on the side of the gallbladder and divided.  The cystic artery was likewise identified and triply silver-clipped and divided.  The gallbladder was then removed uneventfully from the liver bed using the hook cautery device.  The gallbladder was then  removed from the epigastric cannula without problems.  I checked for hemostasis; there was a minimal amount of oozing which was easily controlled with the cautery device and I elected to leave a small amount of Surgicel in the liver bed.  Then after  irrigating and suctioning, the abdomen was then desufflated.  Just prior to this, I checked again the area of the lysis of adhesions, there was no bleeding and hemostasis was deemed complete. After the abdomen was then desufflated, the fascial incisions were closed using 0 Polysorb suture and I elected to use 1% Sensorcaine in all the incision sites to help with postoperative discomfort.  All skin incisions were then closed with a small stapling device.  Estimated blood loss was minimal. Patient received 1400 of crystalloids intraoperatively.  There were no complications. Dictated by:   Barbaraann Barthel, M.D. Attending Physician:  Barbaraann Barthel DD:  03/06/01 TD:  03/07/01 Job: [id number]

## 2010-10-17 NOTE — Op Note (Signed)
NAME:  CORALIE, STANKE                 ACCOUNT NO.:  [id number]  MEDICAL RECORD NO.:  [id number]           PATIENT TYPE:  O  LOCATION:  DAYP                          FACILITY:  [facility]  PHYSICIAN:  R. [name], M.D. DATE OF BIRTH:  07-Dec-1932  DATE OF PROCEDURE:  10/11/2010 DATE OF DISCHARGE:                              OPERATIVE REPORT   EGD with pyloric channel dilation followed by gastric and duodenal biopsies, followed by ileocolonoscopy, segmental biopsies.  INDICATIONS FOR PROCEDURE:  A 77 year old lady with chronic diarrhea, distant history of microscopic colitis.  Last colonoscopy nearly 10 years ago.  Ileocolonoscopy along with EGD now being done.  She has had significant epigastric pain.  Recently worsened by eating and relieved by vomiting.  No dysphagia.  No recent NSAIDs or aspirin daily.  EGD and colonoscopy is now being done.  Risks, benefits, limitations, alternatives, and imponderables have been discussed, questions answered. Please see the documentation for medical record.  PROCEDURE NOTE:  O2 saturation, blood pressure, pulse, and respirations were monitored throughout the entirety of both procedures.  CONSCIOUS SEDATION:  Versed 4 mg IV, Demerol 100 mg IV in divided doses.  INSTRUMENT:  Pentax video chip system.  FINDINGS:  EGD, examination of tubular esophagus revealed normal- appearing mucosa.  EGD junction easily traversed.  Stomach:  A slight amount of minimal gastric contents and bile-stained mucus which was easily washed out.  Stomach inflated well with air.  Thorough examination of gastric mucosa including retroflexed view of proximal stomach, esophagogastric junction demonstrated a small hiatal hernia and some patchy atrophic-appearing gastric mucosa.  There was no ulcer infiltrating process.  Please note the pyloric channel was stenotic and fibrotic with a pinpoint aperture, was unable to pass the gastroscope into the duodenum.  I graduated to 12  mm TTS balloon, passed through the scope and across the stenotic pyloric channel and inflated it to 12 mm 3 atmospheres x1 minute took it down.  This was associated with pretty good dilation of the pyloric channel.  There was minimal bleeding.  I was able to easily advance the scope down into the duodenum.  The bulb, second, and third portion appeared normal.  Therapeutic/diagnostic maneuvers, biopsies of D2 and D3 were taken.  Also biopsies of gastric mucosa were taken.  The patient tolerated the procedure well and was prepared for colonoscopy.  Digital rectal exam revealed no abnormalities with a single external hemorrhoidal tag.  Endoscopic findings, prep was adequate.  Colon:  Colonic mucosa was surveyed from the rectosigmoid junction through the left transverse right colon to the appendiceal orifice, ileocecal valve/cecum.  These structures well seen and photographed for the record.  Terminal ileum was intubated to 15 cm.  From this level the scope was slowly and cautiously withdrawn.  All previously mentioned mucosal surfaces were again seen.  The patient has scattered pancolonic diverticula.  There was a circular fold in the mid ascending colon which looked more like an anastomosis that anything else.  Otherwise site of prior surgery was totally inapparent.  The terminal ileal mucosa appeared normal.  The colonic mucosa otherwise appeared normal.  Segmental  biopsies of the ascending, descending, sigmoid segments were taken for histologic study. Scope was pulled down into the rectum with thorough examination of the rectal mucosa including retroflexed view of the anal verge demonstrating no abnormalities.  The rectum was also biopsied.  The patient tolerated this procedure well.  Cecal withdrawal time 10 minutes.  IMPRESSION: 1. Esophagogastroduodenoscopy.  Normal esophagus, small hiatal hernia.     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  Likely contributing to     some of the  patient's symptoms, status post TTS balloon dilation.     Normal D1-D3 status post biopsy.  D2-D3 atrophic-appearing gastric     mucosa status post biopsy.  Colonoscopy findings, external     hemorrhoidal tag.  Normal rectum status post biopsy. 2. Pancolonic diverticula status post segmental biopsy.  Normal     terminal ileum.  A question has been raised as whether is to or not this lady can take Mobic for her back problems.  At this time we will hold making recommendations until not have the histology back.  If she has microscopic colitis present with diarrhea, nonsteroidals could make this worse.  Stenotic pyloric channel may be related to aspirin/NSAID injury. We need to rule out H. Pylori.  Further recommendations on this issue to follow in the very near future.     Jonathon Bellows, M.D.     RMR/MEDQ  D:  10/11/2010  T:  10/11/2010  Job:  [id number]  cc:   Kingsley Callander. Ouida Sills, MD Fax: [phone]  Payton Doughty, M.D. Fax: [phone]  Electronically Signed by Lorrin Goodell M.D. on 10/17/2010 10:04:47 AM

## 2010-10-20 ENCOUNTER — Encounter: Payer: Self-pay | Admitting: Internal Medicine

## 2010-10-26 ENCOUNTER — Telehealth: Payer: Self-pay

## 2010-10-26 NOTE — Telephone Encounter (Signed)
Pt called- wants to know if she still needs to continue dexilant and align? And if so , how long does she need to take it? Please advise

## 2010-10-31 ENCOUNTER — Ambulatory Visit (HOSPITAL_COMMUNITY)
Admission: RE | Admit: 2010-10-31 | Discharge: 2010-10-31 | Disposition: A | Discharge status: Home / Self Care | Payer: Medicare Other | Source: Ambulatory Visit | Attending: Internal Medicine | Admitting: Internal Medicine

## 2010-10-31 DIAGNOSIS — Z1231 Encounter for screening mammogram for malignant neoplasm of breast: Secondary | ICD-10-CM | POA: Insufficient documentation

## 2010-10-31 DIAGNOSIS — Z139 Encounter for screening, unspecified: Secondary | ICD-10-CM

## 2010-10-31 NOTE — Telephone Encounter (Signed)
Continue Soil scientist. Follow-up in 3 mos.

## 2010-10-31 NOTE — Telephone Encounter (Signed)
Pt advised to continue Dexilant and Align daily- She is scheduled for a 39m follow up visit,

## 2010-12-07 ENCOUNTER — Other Ambulatory Visit: Payer: Self-pay | Admitting: Gastroenterology

## 2011-01-12 ENCOUNTER — Encounter: Payer: Self-pay | Admitting: Gastroenterology

## 2011-01-12 ENCOUNTER — Telehealth: Payer: Self-pay | Admitting: Internal Medicine

## 2011-01-12 ENCOUNTER — Ambulatory Visit (INDEPENDENT_AMBULATORY_CARE_PROVIDER_SITE_OTHER): Payer: Medicare Other | Admitting: Gastroenterology

## 2011-01-12 ENCOUNTER — Other Ambulatory Visit: Payer: Self-pay | Admitting: Gastroenterology

## 2011-01-12 VITALS — BP 95/56 | HR 76 | Temp 97.5°F | Ht 60.0 in | Wt 106.4 lb

## 2011-01-12 DIAGNOSIS — R197 Diarrhea, unspecified: Secondary | ICD-10-CM

## 2011-01-12 MED ORDER — CHOLESTYRAMINE 4 G PO PACK: 0.5000 | PACK | Freq: Two times a day (BID) | ORAL | Status: DC

## 2011-01-12 MED ORDER — DEXLANSOPRAZOLE 60 MG PO CPDR: 60.0000 mg | DELAYED_RELEASE_CAPSULE | Freq: Every day | ORAL | Status: DC

## 2011-01-12 NOTE — Progress Notes (Signed)
Referring Provider: [name], MD Primary Care Physician:  [name], MD Primary Gastroenterologist: Dr. Jena Gauss   Chief Complaint  Patient presents with  . Follow-up  . Diarrhea    HPI:   Ms. Kushner returns today in f/u after EGD and colonoscopy. Findings on EGD of stenotic pyloric channel, s/p TTS dilation. Doing well with UGI symptoms, denies dysphagia, abdominal pain, N/V. Colonoscopy with pancolonic diverticula, nl TI, external hemorrhoidal tag. Multiple biopsies without evidence for IBD, microscopic colitis. Pt continues to report TNTC episodes of diarrhea. Down 7 lbs since May. Postprandial diarrhea. Abdominal cramping precedes loose stools. No melena or brbpr.  Taking Align and Imodium.   Past Medical History  Diagnosis Date  . Diverticulitis     s/p colectomy in 1991  . IBS (irritable bowel syndrome)   . HTN (hypertension)   . Hyperlipidemia   . S/P colonoscopy 2003    minimal internal hemorrhoids, pancolonic diverticula, biopsy of rectum: lymphoplasmacytic microscopic colitis, colon biopsies normal  . S/P colonoscopy 2012    pancolonic diverticula, nl TI, external hemorrhoidal tag,  . S/P endoscopy 2012    normal esophagus, stenotic pyloric channel, TTS dilation    Past Surgical History  Procedure Date  . Colectomy   . Cholecystectomy   . Right ovarian tumor removed as teenager   . Abdominal hysterectomy   . Knee surgery   . Bilateral cataracts     Current Outpatient Prescriptions  Medication Sig Dispense Refill  . aspirin 325 MG tablet Take 325 mg by mouth daily.        Marland Kitchen atorvastatin (LIPITOR) 80 MG tablet Take 80 mg by mouth daily.        . Calcium Citrate-Vitamin D (CITRACAL PETITES/VITAMIN D PO) Take by mouth.        . citalopram (CELEXA) 40 MG tablet Take 40 mg by mouth daily.        Marland Kitchen dexlansoprazole (DEXILANT) 60 MG capsule Take 1 capsule (60 mg total) by mouth daily.  31 capsule  11  . diphenoxylate-atropine (LOMOTIL) 2.5-0.025 MG/5ML liquid Take 5  mLs by mouth 4 (four) times daily as needed.        Marland Kitchen levothyroxine (SYNTHROID, LEVOTHROID) 50 MCG tablet Take 50 mcg by mouth daily.        Marland Kitchen losartan (COZAAR) 100 MG tablet Take 100 mg by mouth daily.        . Probiotic Product (ALIGN) 4 MG CAPS Take by mouth.        . RESTASIS 0.05 % ophthalmic emulsion       . ALPRAZolam (XANAX) 0.5 MG tablet Take 0.5 mg by mouth at bedtime as needed.        . chlordiazePOXIDE (LIBRIUM) 5 MG capsule Take 5 mg by mouth 3 (three) times daily as needed.        . cholestyramine (QUESTRAN) 4 G packet Take 0.5 packets by mouth 2 (two) times daily.  60 each  1  . oxyCODONE-acetaminophen (PERCOCET) 5-325 MG per tablet Take 1 tablet by mouth every 4 (four) hours as needed.        . pregabalin (LYRICA) 75 MG capsule Take 75 mg by mouth 2 (two) times daily.        . traMADol (ULTRAM) 50 MG tablet Take 50 mg by mouth every 6 (six) hours as needed.          Allergies as of 01/12/2011  . (No Known Allergies)    Family History  Problem Relation Age of Onset  .  Colon cancer Neg Hx     History   Social History  . Marital Status: Married    Spouse Name: N/A    Number of Children: N/A  . Years of Education: N/A   Social History Main Topics  . Smoking status: Never Smoker   . Smokeless tobacco: None  . Alcohol Use: No  . Drug Use: No  . Sexually Active: None   Other Topics Concern  . None   Social History Narrative  . None    Review of Systems: Gen: Denies fever, chills, anorexia. Denies fatigue, weakness, + weight loss.  CV: Denies chest pain, palpitations, syncope, peripheral edema, and claudication. Resp: Denies dyspnea at rest, cough, wheezing, coughing up blood, and pleurisy. GI: Denies vomiting blood, jaundice, and fecal incontinence.   Denies dysphagia or odynophagia. Derm: Denies rash, itching, dry skin Psych: Denies depression, anxiety, memory loss, confusion. No homicidal or suicidal ideation.  Heme: Denies bruising, bleeding, and  enlarged lymph nodes.  Physical Exam: BP 95/56  Pulse 76  Temp(Src) 97.5 F (36.4 C) (Temporal)  Ht 5' (1.524 m)  Wt 106 lb 6.4 oz (48.263 kg)  BMI 20.78 kg/m2 General:   Alert and oriented. No distress noted. Pleasant and cooperative.  Head:  Normocephalic and atraumatic. Eyes:  Conjuctiva clear without scleral icterus. Mouth:  Oral mucosa pink and moist. Good dentition. No lesions. Neck:  Supple, without mass or thyromegaly. Heart:  S1, S2 present without murmurs, rubs, or gallops. Regular rate and rhythm. Abdomen:  +BS, soft, non-tender and non-distended. No rebound or guarding. No HSM or masses noted. Msk:  Symmetrical without gross deformities. Normal posture. Extremities:  Without edema. Neurologic:  Alert and  oriented x4;  grossly normal neurologically. Skin:  Intact without significant lesions or rashes. Psych:  Alert and cooperative. Normal mood and affect.

## 2011-01-12 NOTE — Assessment & Plan Note (Addendum)
77 year old female with hx of microscopic colitis in past, with recent colonoscopy benign. Continues to report multiple loose stools daily. No melena or brbpr. Thoroughly evaluated in past (see last HPI from May for full details). Stool studies thus far negative. Unable to find documented Cdiff. Will add for completeness. Trial of Questran. Discussed with Dr. Jena Gauss. If no improvement with Questran, consider trial of entocort due to hx of microscopic colitis.  Cdiff PCR Questran 2 g BID (may need to increase) Consider entocort in future if no relief F/U in 4 weeks for OV and wt assessment

## 2011-01-12 NOTE — Patient Instructions (Signed)
Please complete one more stool sample. We will be calling you with results.  I will discuss the pathology reports with Dr. Jena Gauss to determine the best course of treatment with you. We will be in contact very soon.

## 2011-01-13 NOTE — Progress Notes (Signed)
Cc to PCP 

## 2011-01-13 NOTE — Telephone Encounter (Signed)
Routed to provider

## 2011-01-18 ENCOUNTER — Telehealth: Payer: Self-pay

## 2011-01-18 NOTE — Telephone Encounter (Signed)
Patient called and stated that her stools are starting to get hard so she thinks she may need to cut back on her medication(Questran) to just 0.5 packet Daily instead of 0.5 packet Bid.Please advised.

## 2011-01-18 NOTE — Telephone Encounter (Signed)
Yes. Take half a pack once per day. May need to do every other day. So glad she is starting to find some relief.

## 2011-01-18 NOTE — Telephone Encounter (Signed)
Called patient and told her to do only half a packet daily and if that did not help to do half a packet every other day. She said ok and thanks.

## 2011-01-19 NOTE — Progress Notes (Signed)
Quick Note:  Pt aware ______ 

## 2011-02-17 ENCOUNTER — Encounter: Payer: Self-pay | Admitting: Internal Medicine

## 2011-02-17 ENCOUNTER — Ambulatory Visit (INDEPENDENT_AMBULATORY_CARE_PROVIDER_SITE_OTHER): Payer: Medicare Other | Admitting: Internal Medicine

## 2011-02-17 VITALS — BP 95/59 | HR 65 | Temp 97.9°F | Ht 60.0 in | Wt 108.0 lb

## 2011-02-17 DIAGNOSIS — R197 Diarrhea, unspecified: Secondary | ICD-10-CM

## 2011-02-17 NOTE — Progress Notes (Signed)
Primary Care Physician:  [name], MD Primary Gastroenterologist:  Dr.   Pre-Procedure History & Physical: HPI:  Angela Galvan is a 77 y.o. female here for followup of diarrhea which is felt to be more diarrhea predominant irritable  bowel syndrome than any thing else. Angela Galvan has actually worked too well;  as little as  2 g daily  as constipated her to the point of no bowel movement in 3 days. She's no longer taking Lomotil. Prior ileocolonoscopy demonstrated no endoscopic or histologic evidence of inflammatory bowel disease or microscopic colitis. GERD symptoms well controlled on Dexilant. Please note this nice lady was sent a letter from this office implying that she had Crohn's disease. This was erroneous. I have reviewed the record. There is no endoscopic, histologic or radiological evidence of this entity in Angela Galvan. The erroneous nature of this letter was fully discussed with the patient in the office today.   Past Medical History  Diagnosis Date  . Diverticulitis     s/p colectomy in 1991  . IBS (irritable bowel syndrome)   . HTN (hypertension)   . Hyperlipidemia   . S/P colonoscopy 2003    minimal internal hemorrhoids, pancolonic diverticula, biopsy of rectum: lymphoplasmacytic microscopic colitis, colon biopsies normal  . S/P colonoscopy 2012    pancolonic diverticula, nl TI, external hemorrhoidal tag,  . S/P endoscopy 2012    normal esophagus, stenotic pyloric channel, TTS dilation    Past Surgical History  Procedure Date  . Colectomy   . Cholecystectomy   . Right ovarian tumor removed as teenager   . Abdominal hysterectomy   . Knee surgery   . Bilateral cataracts     Prior to Admission medications   Medication Sig Start Date End Date Taking? Authorizing Provider  ALPRAZolam Prudy Feeler) 0.5 MG tablet Take 0.5 mg by mouth at bedtime as needed.     Yes Historical Provider, MD  aspirin 325 MG tablet Take 325 mg by mouth daily.     Yes Historical Provider, MD    atorvastatin (LIPITOR) 80 MG tablet Take 80 mg by mouth daily.     Yes Historical Provider, MD  Calcium Citrate-Vitamin D (CITRACAL PETITES/VITAMIN D PO) Take by mouth.     Yes Historical Provider, MD  chlordiazePOXIDE (LIBRIUM) 5 MG capsule Take 5 mg by mouth 3 (three) times daily as needed.     Yes Historical Provider, MD  cholestyramine Angela Galvan) 4 G packet Take 0.5 packets by mouth 2 (two) times daily. 01/12/11 01/12/12 Yes Gerrit Halls, NP  citalopram (CELEXA) 40 MG tablet Take 40 mg by mouth daily.     Yes Historical Provider, MD  dexlansoprazole (DEXILANT) 60 MG capsule Take 1 capsule (60 mg total) by mouth daily. 01/12/11  Yes Gerrit Halls, NP  diphenoxylate-atropine (LOMOTIL) 2.5-0.025 MG/5ML liquid Take 5 mLs by mouth 4 (four) times daily as needed.     Yes Historical Provider, MD  levothyroxine (SYNTHROID, LEVOTHROID) 50 MCG tablet Take 50 mcg by mouth daily.     Yes Historical Provider, MD  losartan (COZAAR) 100 MG tablet Take 100 mg by mouth daily.     Yes Historical Provider, MD  oxyCODONE-acetaminophen (PERCOCET) 5-325 MG per tablet Take 1 tablet by mouth every 4 (four) hours as needed.     Yes Historical Provider, MD  pregabalin (LYRICA) 75 MG capsule Take 75 mg by mouth 2 (two) times daily.     Yes Historical Provider, MD  Probiotic Product (ALIGN) 4 MG CAPS Take by mouth.  Yes Historical Provider, MD  RESTASIS 0.05 % ophthalmic emulsion  01/11/11  Yes Historical Provider, MD  traMADol (ULTRAM) 50 MG tablet Take 50 mg by mouth every 6 (six) hours as needed.     Yes Historical Provider, MD    Allergies as of 02/17/2011  . (No Known Allergies)    Family History  Problem Relation Age of Onset  . Colon cancer Neg Hx     History   Social History  . Marital Status: Married    Spouse Name: N/A    Number of Children: N/A  . Years of Education: N/A   Occupational History  . Not on file.   Social History Main Topics  . Smoking status: Never Smoker   . Smokeless tobacco: Not  on file  . Alcohol Use: No  . Drug Use: No  . Sexually Active: Not on file   Other Topics Concern  . Not on file   Social History Narrative  . No narrative on file    Review of Systems: See HPI, otherwise negative ROS  Physical Exam: BP 95/59  Pulse 65  Temp(Src) 97.9 F (36.6 C) (Temporal)  Ht 5' (1.524 m)  Wt 108 lb (48.988 kg)  BMI 21.09 kg/m2 General:   Alert,  Well-developed, well-nourished, pleasant and cooperative in NAD Head:  Normocephalic and atraumatic. Eyes:  Sclera clear, no icterus.   Conjunctiva pink. Ears:  Normal auditory acuity. Nose:  No deformity, discharge,  or lesions. Mouth:  No deformity or lesions, dentition normal. Neck:  Supple; no masses or thyromegaly. Lungs:  Clear throughout to auscultation.   No wheezes, crackles, or rhonchi. No acute distress. Heart:  Regular rate and rhythm; no murmurs, clicks, rubs,  or gallops. Abdomen:  Soft, nontender and nondistended. No masses, hepatosplenomegaly or hernias noted. Normal bowel sounds, without guarding, and without rebound.   Msk:  Symmetrical without gross deformities. Normal posture. Pulses:  Normal pulses noted. Extremities:  Without clubbing or edema. Neurologic:  Alert and  oriented x4;  grossly normal neurologically. Skin:  Intact without significant lesions or rashes. Cervical Nodes:  No significant cervical adenopathy. Psych:  Alert and cooperative. Normal mood and affect.

## 2011-02-17 NOTE — Patient Instructions (Signed)
Continue Questran but at a dose of 1 g daily.  Keep a stool diary.  Telephone progress report in one month.  Office followup 3 months.

## 2011-02-17 NOTE — Assessment & Plan Note (Signed)
Almost certainly secondary to irritable bowel syndrome. We have actually overshot our endpoint with Questran, producing constipation. She seems to be sensitive to even a modest dose.  However, I continue to believe we can find an appropriate dose which will greatly ameliorate her symptoms.  I'm glad to see that she has stopped taking Lomotil.  Recommendations: Try Questran at a dose of 1 g daily for the next month. Keep a stool diary. Give [place] a call in one month and let us know how you have done. We will tentatively plan to see this nice lady back in 3 months.

## 2011-03-27 ENCOUNTER — Telehealth: Payer: Self-pay

## 2011-03-27 NOTE — Telephone Encounter (Signed)
Appt 05/19/11 w/RMR

## 2011-03-27 NOTE — Telephone Encounter (Signed)
Pt called- she said to let RMR know that she was doing very well on questran. She is taking 1/2 a teaspoon at night and it is working great.

## 2011-03-27 NOTE — Telephone Encounter (Signed)
Great to her it; plan for ov in 3 mos or as scheduled

## 2011-05-19 ENCOUNTER — Ambulatory Visit (INDEPENDENT_AMBULATORY_CARE_PROVIDER_SITE_OTHER): Payer: Medicare Other | Admitting: Internal Medicine

## 2011-05-19 ENCOUNTER — Encounter: Payer: Self-pay | Admitting: Internal Medicine

## 2011-05-19 VITALS — BP 100/53 | HR 82 | Temp 98.6°F | Ht 60.0 in | Wt 117.4 lb

## 2011-05-19 DIAGNOSIS — R197 Diarrhea, unspecified: Secondary | ICD-10-CM

## 2011-05-19 DIAGNOSIS — K219 Gastro-esophageal reflux disease without esophagitis: Secondary | ICD-10-CM

## 2011-05-19 DIAGNOSIS — K589 Irritable bowel syndrome without diarrhea: Secondary | ICD-10-CM

## 2011-05-19 NOTE — Progress Notes (Signed)
Primary Care Physician:  [name], MD Primary Gastroenterologist:  Dr. Jena Gauss  Pre-Procedure History & Physical: HPI:  Angela Galvan is a 77 y.o. female here for followup GERD and diarrhea predominant irritable bowel syndrome. She states she is doing extremely well on 1/4 teaspoon of Questran daily. Has 1-2 formed bowel movements daily. No abdominal pain melena rectal bleeding. Prior EGD and colonoscopy findings as noted.  Typical reflux symptoms well controlled. She notes a gurgling behind her breastbone and at night from time to time does not eat late. No dysphagia no abdominal dysphagia. Has not helped the symptoms.  Patient reports she sleeps very poorly. Trazodone added to her regimen by Dr. Ouida Sills.  Past Medical History  Diagnosis Date  . Diverticulitis     s/p colectomy in 1991  . IBS (irritable bowel syndrome)   . HTN (hypertension)   . Hyperlipidemia   . S/P colonoscopy 2003    minimal internal hemorrhoids, pancolonic diverticula, biopsy of rectum: lymphoplasmacytic microscopic colitis, colon biopsies normal  . S/P colonoscopy 2012    pancolonic diverticula, nl TI, external hemorrhoidal tag,  . S/P endoscopy 2012    normal esophagus, stenotic pyloric channel, TTS dilation    Past Surgical History  Procedure Date  . Colectomy   . Cholecystectomy   . Right ovarian tumor removed as teenager   . Abdominal hysterectomy   . Knee surgery   . Bilateral cataracts     Prior to Admission medications   Medication Sig Start Date End Date Taking? Authorizing Provider  ALPRAZolam Prudy Feeler) 0.5 MG tablet Take 0.5 mg by mouth at bedtime as needed.     Yes Historical Provider, MD  aspirin 325 MG tablet Take 325 mg by mouth daily.     Yes Historical Provider, MD  atorvastatin (LIPITOR) 80 MG tablet Take 80 mg by mouth daily.     Yes Historical Provider, MD  Calcium Citrate-Vitamin D (CITRACAL PETITES/VITAMIN D PO) Take by mouth.     Yes Historical Provider, MD  chlordiazePOXIDE (LIBRIUM) 5  MG capsule Take 5 mg by mouth 3 (three) times daily as needed.     Yes Historical Provider, MD  cholestyramine [name]) 4 G packet Take 0.5 packets by mouth 2 (two) times daily. 01/12/11 01/12/12 Yes Gerrit Halls, NP  citalopram (CELEXA) 40 MG tablet Take 40 mg by mouth daily.     Yes Historical Provider, MD  cyclobenzaprine (FLEXERIL) 10 MG tablet Take 10 mg by mouth 2 (two) times daily as needed.  05/04/11  Yes Historical Provider, MD  dexlansoprazole (DEXILANT) 60 MG capsule Take 1 capsule (60 mg total) by mouth daily. 01/12/11  Yes Gerrit Halls, NP  diphenoxylate-atropine (LOMOTIL) 2.5-0.025 MG/5ML liquid Take 5 mLs by mouth 4 (four) times daily as needed.     Yes Historical Provider, MD  levothyroxine (SYNTHROID, LEVOTHROID) 50 MCG tablet Take 50 mcg by mouth daily.     Yes Historical Provider, MD  losartan (COZAAR) 100 MG tablet Take 100 mg by mouth daily.     Yes Historical Provider, MD  oxyCODONE-acetaminophen (PERCOCET) 5-325 MG per tablet Take 1 tablet by mouth every 4 (four) hours as needed.     Yes Historical Provider, MD  pregabalin (LYRICA) 75 MG capsule Take 75 mg by mouth 2 (two) times daily.     Yes Historical Provider, MD  Probiotic Product (ALIGN) 4 MG CAPS Take by mouth.     Yes Historical Provider, MD  RESTASIS 0.05 % ophthalmic emulsion  01/11/11  Yes Historical Provider,  MD  traMADol (ULTRAM) 50 MG tablet Take 50 mg by mouth every 6 (six) hours as needed.     Yes Historical Provider, MD  traZODone (DESYREL) 50 MG tablet Take 50 mg by mouth at bedtime.  05/18/11  Yes Historical Provider, MD    Allergies as of 05/19/2011  . (No Known Allergies)    Family History  Problem Relation Age of Onset  . Colon cancer Neg Hx     History   Social History  . Marital Status: Married    Spouse Name: N/A    Number of Children: N/A  . Years of Education: N/A   Occupational History  . Not on file.   Social History Main Topics  . Smoking status: Never Smoker   . Smokeless tobacco: Not  on file  . Alcohol Use: No  . Drug Use: No  . Sexually Active: Not on file   Other Topics Concern  . Not on file   Social History Narrative  . No narrative on file    Review of Systems: See HPI, otherwise negative ROS  Physical Exam: BP 100/53  Pulse 82  Temp(Src) 98.6 F (37 C) (Temporal)  Ht 5' (1.524 m)  Wt 117 lb 6.4 oz (53.252 kg)  BMI 22.93 kg/m2 General:   Alert,  Well-developed, well-nourished, pleasant and cooperative in NAD Skin:  Intact without significant lesions or rashes. Eyes:  Sclera clear, no icterus.   Conjunctiva pink. Ears:  Normal auditory acuity. Nose:  No deformity, discharge,  or lesions. Mouth:  No deformity or lesions. Neck:  Supple; no masses or thyromegaly. No significant cervical adenopathy. Lungs:  Clear throughout to auscultation.   No wheezes, crackles, or rhonchi. No acute distress. Heart:  Regular rate and rhythm; no murmurs, clicks, rubs,  or gallops. Abdomen: Non-distended, normal bowel sounds.  Soft and nontender without appreciable mass or hepatosplenomegaly.  Pulses:  Normal pulses noted. Extremities:  Without clubbing or edema.  Impression/Plan:

## 2011-05-19 NOTE — Patient Instructions (Signed)
Stop Dexilant; Try Nexium 40 Mg Daily Samples Provided  Continue Cholestyramine  GERD Information Provided  Office Visit 6 Months.  Patient Is Let us Know How Nexium Is Working for Her in 3-4 Weeks

## 2011-05-19 NOTE — Assessment & Plan Note (Signed)
Overall, Angela Galvan is doing very well. Diarrhea -predominate IBS well-controlled on nearly a homeopathic dose of Questran. She was admonished not to take this medication within 2 hours of other medications.  Retrosternal gurgling at night nonspecific somewhat atypical for GERD. No typical reflux symptoms on Dexilant;  Recommendations: Continue Questran at current dosing  Stop Dexilant try a course of Nexium 40 mg orally daily. I provided her samples. She is to let us know in one month as she is doing.  Otherwise, a followup appointment here in 6 months

## 2011-05-31 ENCOUNTER — Telehealth: Payer: Self-pay | Admitting: Internal Medicine

## 2011-05-31 NOTE — Telephone Encounter (Signed)
Patient is doing well on the Nexium and she only has a couple left, does she continue to use Nexium or D/C please advise patient uses [place]??

## 2011-06-01 NOTE — Telephone Encounter (Signed)
Please call in prescription for Nexium 40 mg orally daily dispense 30 or 90 with one years worth of refills

## 2011-06-01 NOTE — Telephone Encounter (Signed)
rx called in to [facility], pt aware

## 2011-06-15 DIAGNOSIS — IMO0002 Reserved for concepts with insufficient information to code with codable children: Secondary | ICD-10-CM | POA: Diagnosis not present

## 2011-06-15 DIAGNOSIS — M1710 Unilateral primary osteoarthritis, unspecified knee: Secondary | ICD-10-CM | POA: Diagnosis not present

## 2011-06-26 DIAGNOSIS — K5732 Diverticulitis of large intestine without perforation or abscess without bleeding: Secondary | ICD-10-CM | POA: Diagnosis not present

## 2011-07-03 DIAGNOSIS — T8529XA Other mechanical complication of intraocular lens, initial encounter: Secondary | ICD-10-CM | POA: Diagnosis not present

## 2011-09-01 ENCOUNTER — Encounter: Payer: Self-pay | Admitting: Internal Medicine

## 2011-09-01 DIAGNOSIS — M1990 Unspecified osteoarthritis, unspecified site: Secondary | ICD-10-CM | POA: Diagnosis not present

## 2011-09-01 DIAGNOSIS — I10 Essential (primary) hypertension: Secondary | ICD-10-CM | POA: Diagnosis not present

## 2011-09-04 ENCOUNTER — Other Ambulatory Visit (HOSPITAL_COMMUNITY): Payer: Self-pay | Admitting: Internal Medicine

## 2011-09-04 DIAGNOSIS — Z139 Encounter for screening, unspecified: Secondary | ICD-10-CM

## 2011-09-15 ENCOUNTER — Ambulatory Visit (INDEPENDENT_AMBULATORY_CARE_PROVIDER_SITE_OTHER): Payer: Medicare Other | Admitting: Internal Medicine

## 2011-09-15 ENCOUNTER — Encounter: Payer: Self-pay | Admitting: Internal Medicine

## 2011-09-15 ENCOUNTER — Encounter (HOSPITAL_COMMUNITY): Payer: Self-pay | Admitting: Pharmacy Technician

## 2011-09-15 VITALS — BP 91/52 | HR 75 | Temp 97.7°F | Ht 60.0 in | Wt 117.6 lb

## 2011-09-15 DIAGNOSIS — K219 Gastro-esophageal reflux disease without esophagitis: Secondary | ICD-10-CM

## 2011-09-15 DIAGNOSIS — R197 Diarrhea, unspecified: Secondary | ICD-10-CM | POA: Diagnosis not present

## 2011-09-15 NOTE — Patient Instructions (Signed)
Hold Nexium; try Aciphex 20 mg daily x 2 weeks and let me know how it works  Further recommendations to follow  May continue Questran 1/2 teaspoon daily

## 2011-09-15 NOTE — Assessment & Plan Note (Signed)
Overall Angela Galvan is doing fairly well from a GI standpoint she takes 1/2 to 1/4 a scoop of Questran daily for 3 formed bowel movements daily. She is quite content with bowel function at  this time. She was admonished not to take this medication within 2 hours of other medications. Chronic abdomen pain belching and gas may or may not all be related to reflux. Cannot tell any difference switching Dexilant 2 Nexium     Recommendations: May continue Questran her current dose   Try one more PPI in the way of AcipHex 20 mg orally daily. Coupon for free two-week supply provided. She will let us know in 2 weeks how this medication is working for her. Hold Nexium while on AcipHex.

## 2011-09-15 NOTE — Progress Notes (Signed)
Primary Care Physician:  [name], MD, MD Primary Gastroenterologist:  Dr.   Pre-Procedure History & Physical: HPI:  Angela Galvan is a 78 y.o. female here for followup of GERD. We switched her from Dexilant to Nexium at her last office visit. She really can't tell any difference. Typical heartburn symptoms continued to be well-controlled however she does report belching and increased gas in the afternoons most days. Tells me she is guarding the custom to it. No dysphagia. No nausea or vomiting. A large channel was dilated last year. No evidence of inflammatory bowel disease a colonoscopy. She is getting ready to undergo surgery for left knee replacement.  Past Medical History  Diagnosis Date  . Diverticulitis     s/p colectomy in 1991  . IBS (irritable bowel syndrome)   . HTN (hypertension)   . Hyperlipidemia   . S/P colonoscopy 2003    minimal internal hemorrhoids, pancolonic diverticula, biopsy of rectum: lymphoplasmacytic microscopic colitis, colon biopsies normal  . S/P colonoscopy 2012    pancolonic diverticula, nl TI, external hemorrhoidal tag,/rectum bx no abnormalities  . S/P endoscopy 2012    normal esophagus, stenotic pyloric channel, TTS dilation  . Diarrhea   . GERD (gastroesophageal reflux disease)     Past Surgical History  Procedure Date  . Colectomy   . Cholecystectomy   . Right ovarian tumor removed as teenager   . Abdominal hysterectomy   . Knee surgery   . Bilateral cataracts   . Esophagogastroduodenoscopy 10/11/2010    Normal esophagus, small hiatal hernia,Stenotic pyloric channel likely critical.  Likely contributing to some of the patient's symptoms, status post TTS balloon dilation Normal D1-D3 status post biopsy.  D2-D3 atrophic-appearing gastric mucosa status post biopsy  . Ileocolonoscopy 10/11/2010    external hemorrhoidal tag.  Normal rectum status post biopsy Pancolonic diverticula status post segmental biopsy.  Normal  terminal ileum  . Colonoscopy  09/11/01    Minimal internal hemorrhoids, otherwise normal rectum . Scattered pancolonic diverticula (few) The colonic mucosa otherwise appeared normal status post biopsies and stool sampling as described above.    Prior to Admission medications   Medication Sig Start Date End Date Taking? Authorizing Provider  acetaminophen (TYLENOL) 500 MG tablet Take 500-1,000 mg by mouth every 6 (six) hours as needed. Pain   Yes Historical Provider, MD  ALPRAZolam (XANAX) 0.25 MG tablet Take 0.25 mg by mouth 2 (two) times daily as needed. Anxiety   Yes Historical Provider, MD  aspirin 325 MG tablet Take 325 mg by mouth daily after breakfast.    Yes Historical Provider, MD  atorvastatin (LIPITOR) 10 MG tablet Take 10 mg by mouth daily after breakfast.   Yes Historical Provider, MD  cholestyramine [name]) 4 G packet Take 0.25 packets by mouth 2 (two) times daily. 01/12/11 01/12/12 Yes Nira Retort, NP  citalopram (CELEXA) 20 MG tablet Take 20 mg by mouth daily after breakfast.   Yes Historical Provider, MD  esomeprazole (NEXIUM) 40 MG capsule Take 40 mg by mouth daily before breakfast.   Yes Historical Provider, MD  levothyroxine (SYNTHROID, LEVOTHROID) 50 MCG tablet Take 50 mcg by mouth daily before breakfast.    Yes Historical Provider, MD  losartan (COZAAR) 100 MG tablet Take 50 mg by mouth daily after breakfast.    Yes Historical Provider, MD  Probiotic Product (ALIGN) 4 MG CAPS Take 4 mg by mouth daily.    Yes Historical Provider, MD  RESTASIS 0.05 % ophthalmic emulsion Place 1 drop into both eyes every  12 (twelve) hours.  01/11/11  Yes Historical Provider, MD  traMADol (ULTRAM) 50 MG tablet Take 50 mg by mouth every 6 (six) hours as needed. Pain    Yes Historical Provider, MD  traZODone (DESYREL) 50 MG tablet Take 50-100 mg by mouth at bedtime as needed. Sleep  05/18/11  Yes Historical Provider, MD  dexlansoprazole (DEXILANT) 60 MG capsule Take 1 capsule (60 mg total) by mouth daily. 09/27/10 10/28/10  Nira Retort, NP    Allergies as of 09/15/2011  . (No Known Allergies)    Family History  Problem Relation Age of Onset  . Colon cancer Neg Hx     History   Social History  . Marital Status: Married    Spouse Name: N/A    Number of Children: N/A  . Years of Education: N/A   Occupational History  . Not on file.   Social History Main Topics  . Smoking status: Never Smoker   . Smokeless tobacco: Not on file  . Alcohol Use: No  . Drug Use: No  . Sexually Active: Not on file   Other Topics Concern  . Not on file   Social History Narrative  . No narrative on file    Review of Systems: See HPI, otherwise negative ROS  Physical Exam: BP 91/52  Pulse 75  Temp(Src) 97.7 F (36.5 C) (Temporal)  Ht 5' (1.524 m)  Wt 117 lb 9.6 oz (53.343 kg)  BMI 22.97 kg/m2 General:   Alert,  Well-developed, well-nourished, pleasant and cooperative in NAD Skin:  Intact without significant lesions or rashes. Eyes:  Sclera clear, no icterus.   Conjunctiva pink. Ears:  Normal auditory acuity. Nose:  No deformity, discharge,  or lesions. Mouth:  No deformity or lesions. Neck:  Supple; no masses or thyromegaly. No significant cervical adenopathy. Lungs:  Clear throughout to auscultation.   No wheezes, crackles, or rhonchi. No acute distress. Heart:  Regular rate and rhythm; no murmurs, clicks, rubs,  or gallops. Abdomen: Non-distended, normal bowel sounds.  Soft and nontender without appreciable mass or hepatosplenomegaly.  Pulses:  Normal pulses noted. Extremities:  Without clubbing or edema.

## 2011-09-18 ENCOUNTER — Encounter (HOSPITAL_COMMUNITY)
Admission: RE | Admit: 2011-09-18 | Discharge: 2011-09-18 | Disposition: A | Discharge status: Home / Self Care | Payer: Medicare Other | Source: Ambulatory Visit | Attending: Orthopedic Surgery | Admitting: Orthopedic Surgery

## 2011-09-18 ENCOUNTER — Ambulatory Visit (HOSPITAL_COMMUNITY)
Admission: RE | Admit: 2011-09-18 | Discharge: 2011-09-18 | Disposition: A | Discharge status: Home / Self Care | Payer: Medicare Other | Source: Ambulatory Visit | Attending: Orthopedic Surgery | Admitting: Orthopedic Surgery

## 2011-09-18 ENCOUNTER — Encounter (HOSPITAL_COMMUNITY): Payer: Self-pay

## 2011-09-18 DIAGNOSIS — IMO0002 Reserved for concepts with insufficient information to code with codable children: Secondary | ICD-10-CM | POA: Diagnosis not present

## 2011-09-18 DIAGNOSIS — Z01818 Encounter for other preprocedural examination: Secondary | ICD-10-CM | POA: Insufficient documentation

## 2011-09-18 DIAGNOSIS — Z01811 Encounter for preprocedural respiratory examination: Secondary | ICD-10-CM | POA: Diagnosis not present

## 2011-09-18 DIAGNOSIS — Z01812 Encounter for preprocedural laboratory examination: Secondary | ICD-10-CM | POA: Insufficient documentation

## 2011-09-18 DIAGNOSIS — E119 Type 2 diabetes mellitus without complications: Secondary | ICD-10-CM | POA: Diagnosis not present

## 2011-09-18 DIAGNOSIS — M1710 Unilateral primary osteoarthritis, unspecified knee: Secondary | ICD-10-CM | POA: Diagnosis not present

## 2011-09-18 HISTORY — DX: Hypothyroidism, unspecified: E03.9

## 2011-09-18 HISTORY — DX: Cerebral infarction, unspecified: I63.9

## 2011-09-18 HISTORY — DX: Unspecified osteoarthritis, unspecified site: M19.90

## 2011-09-18 LAB — URINALYSIS, ROUTINE W REFLEX MICROSCOPIC
Ketones, ur: NEGATIVE mg/dL
Leukocytes, UA: NEGATIVE
Nitrite: NEGATIVE
Specific Gravity, Urine: 1.015 (ref 1.005–1.030)
Urobilinogen, UA: 0.2 mg/dL (ref 0.0–1.0)
pH: 6 (ref 5.0–8.0)

## 2011-09-18 LAB — CBC
MCH: 29.7 pg (ref 26.0–34.0)
MCHC: 33 g/dL (ref 30.0–36.0)
Platelets: 417 10*3/uL — ABNORMAL HIGH (ref 150–400)
RBC: 4.28 MIL/uL (ref 3.87–5.11)

## 2011-09-18 LAB — PROTIME-INR: Prothrombin Time: 12.7 seconds (ref 11.6–15.2)

## 2011-09-18 LAB — BASIC METABOLIC PANEL
Calcium: 9.1 mg/dL (ref 8.4–10.5)
GFR calc non Af Amer: 48 mL/min — ABNORMAL LOW (ref >90)
Sodium: 136 mEq/L (ref 135–145)

## 2011-09-18 LAB — SURGICAL PCR SCREEN: MRSA, PCR: NEGATIVE

## 2011-09-18 LAB — APTT: aPTT: 28 seconds (ref 24–37)

## 2011-09-18 NOTE — Pre-Procedure Instructions (Signed)
Spoke with Rhae Lerner, RTR and informed her to let Dr. Lequita Halt know to look at abnormal labs CBC,BMET on pt.

## 2011-09-18 NOTE — Patient Instructions (Signed)
22 Angela Galvan  09/18/2011   Your procedure is scheduled on:  Monday 09/25/2011 at 0835am  Report to [facility] at 0600 AM.  Call this number if you have problems the morning of surgery: [phone]   Remember:   Do not eat food:After Midnight.  May have clear liquids:until Midnight .    Take these medicines the morning of surgery with A SIP OF WATER: Lipitor, Synthroid, Celexa,Nexium, use Restasis eye drops if needed   Do not wear jewelry, make-up or nail polish.  Do not wear lotions, powders, or perfumes.   Do not shave 48 hours prior to surgery(women only-shaving legs)  Do not bring valuables to the hospital.  Contacts, dentures or bridgework may not be worn into surgery.  Leave suitcase in the car. After surgery it may be brought to your room.  For patients admitted to the hospital, checkout time is 11:00 AM the day of discharge.       Special Instructions: CHG Shower Use Special Wash: 1/2 bottle night before surgery and 1/2 bottle morning of surgery.   Please read over the following fact sheets that you were given: MRSA Information, Sleep Apnea sheet, Blood Transfusion sheet, Incentive Spirometry sheet

## 2011-09-19 ENCOUNTER — Other Ambulatory Visit: Payer: Self-pay | Admitting: Gastroenterology

## 2011-09-19 MED ORDER — CHOLESTYRAMINE 4 GM/DOSE PO POWD: 2.0000 g | Freq: Two times a day (BID) | ORAL | Status: DC

## 2011-09-19 MED ORDER — CHOLESTYRAMINE 4 GM/DOSE PO POWD: ORAL | Status: DC

## 2011-09-19 NOTE — Progress Notes (Signed)
Patient in office today at time of husband's appt. Request [name] in can rather than packets. RX done. Faxed to [facility]

## 2011-09-24 ENCOUNTER — Other Ambulatory Visit: Payer: Self-pay | Admitting: Orthopedic Surgery

## 2011-09-24 MED ORDER — BUPIVACAINE 0.25 % ON-Q PUMP SINGLE CATH 300ML
300.0000 mL | INJECTION | Status: DC
  Filled 2011-09-24: qty 300

## 2011-09-24 NOTE — H&P (Signed)
Angela Galvan  DOB: 09/01/1932 Married / Language: English / Race: White / Female  Date of Admission:  09/25/2011  Chief Complaint:    History of Present Illness The patient is a 79 year old female who comes for a preoperative History and Physical. The patient is scheduled for a left total knee arthroplasty to be performed by Dr. Frank V. Aluisio, MD at [facility] on 09/25/2011.  The patient is a 78 year old female who presents with knee complaints. The patient was seen in referral from Dr. Supple. The patient reports left knee symptoms including: pain and swelling. Angela Galvan states her left knee has been bothering her for a long time now. The pain is occurring with activity and at rest. This is limiting what she can and can not do. She states the knee does hurt at night. She has had her right knee replaced and has done well with that. She is ready to get the left knee replacement. They have been treated conservatively in the past for the above stated problem and despite conservative measures, they continue to have progressive pain and severe functional limitations and dysfunction. They have failed non-operative management including home exercise, medications, and injections. It is felt that they would benefit from undergoing total joint replacement. Risks and benefits of the procedure have been discussed with the patient and they elect to proceed with surgery. There are no active contraindications to surgery such as ongoing infection or rapidly progressive neurological disease.  Allergies No Known Drug Allergies. 01/26/2011  Medication History Synthroid ( Oral) Specific dose unknown - Active. Atorvastatin Calcium ( Oral) Specific dose unknown - Active. Citalopram Hydrobromide ( Oral) Specific dose unknown - Active. Losartan Potassium ( Oral) Specific dose unknown - Active. Aspirin (325MG Tablet, 1 Oral) Active. NexIUM (40MG Capsule DR, Oral) Active. Align ( Oral) Specific dose  unknown - Active. Cholestyramine Active. Tylenol (1 Oral) Specific dose unknown - Active. ALPRAZolam (0.25MG Tablet, Oral) Active. TraZODone HCl (50MG Tablet, Oral) Active. TraMADol HCl (50MG Tablet, Oral) Active.  Problem List/Past Medical Lumbago (724.2) Osteoarthritis, knee (715.96) Degeneration, lumbar/lumbosacral disc (722.52) Hypothyroidism Irritable bowel syndrome Osteoarthritis High blood pressure Hypercholesterolemia Stroke. March 2008 (some lower extremity weakness) Hiatal Hernia Gastroesophageal Reflux Disease Diet-Controlled Diabetes Mellitus Menopause Diverticulosis  Past Surgical History Gallbladder Surgery. Date: 1992. open Hysterectomy. Date: 1983. complete (non-cancerous) Total Knee Replacement. Date: 2004. right Cataract Surgery. bilateral: Left - 2008, Right - 2010 Colon Polyp Removal - Colonoscopy Foot Surgery. Date: 2006. right Tumor Excision Right Ovary. 1950's Colon Removal - Partial. Date: 1991. Diverticulitis Oophorectomy; Unilateral. Right, 1950's  Family History Heart Disease. mother and father Osteoarthritis. mother Cerebrovascular Accident. mother, sister and grandmother mothers side Diabetes Mellitus. father Father. Deceased, Myocardial infarction. age 76 Mother. Deceased, Cerebrovascular Accident.  Social History Illicit drug use. no Exercise. Exercises rarely Marital status. married Tobacco use. Never smoker. never smoker Pain Contract. no Children. 1 Alcohol use. former drinker Current work status. retired Drug/Alcohol Rehab (Previously). no Drug/Alcohol Rehab (Currently). no Post-Surgical Plans. Plan is to go home. Advance Directives. Living Will, Healthcare POA  Review of Systems General:Not Present- Chills, Fever, Night Sweats, Fatigue, Weight Gain, Weight Loss and Memory Loss. Skin:Not Present- Hives, Itching, Rash, Eczema and Lesions. HEENT:Not Present- Tinnitus, Headache, Double Vision,  Visual Loss, Hearing Loss and Dentures. Respiratory:Not Present- Shortness of breath with exertion, Shortness of breath at rest, Allergies, Coughing up blood and Chronic Cough. Cardiovascular:Not Present- Chest Pain, Racing/skipping heartbeats, Difficulty Breathing Lying Down, Murmur, Swelling and Palpitations. Gastrointestinal:Present-   Diarrhea. Not Present- Bloody Stool, Heartburn, Abdominal Pain, Vomiting, Nausea, Constipation, Difficulty Swallowing, Jaundice and Loss of appetitie. Female Genitourinary:Not Present- Blood in Urine, Urinary frequency, Weak urinary stream, Discharge, Flank Pain, Incontinence, Painful Urination, Urgency, Urinary Retention and Urinating at Night. Musculoskeletal:Present- Joint Swelling and Joint Pain. Not Present- Muscle Weakness, Muscle Pain, Back Pain, Morning Stiffness and Spasms. Neurological:Not Present- Tremor, Dizziness, Blackout spells, Paralysis, Difficulty with balance and Weakness. Psychiatric:Not Present- Insomnia.  Vitals Weight: 117 lb Height: 60 in Body Surface Area: 1.5 m Body Mass Index: 22.85 kg/m Temp.: 68 FResp.: 12 (Unlabored) BP: 108/52 (Sitting, Left Arm, Standard)  Physical Exam The physical exam findings are as follows: Patient is accompanied today by husband Angela Galvan. Pateint is a 79 year old female with continued knee pain.   General Mental Status - Alert, cooperative and good historian. General Appearance- pleasant. Not in acute distress. Orientation- Oriented X3. Build & Nutrition- Well nourished and Well developed.   Head and Neck Head- normocephalic, atraumatic . Neck Global Assessment- supple. no bruit auscultated on the right and no bruit auscultated on the left.   Eye Pupil- Bilateral- Regular and Round. Note: wears glasses Motion- Bilateral- EOMI.   Chest and Lung Exam Auscultation: Breath sounds:- clear at anterior chest wall and - clear at posterior chest  wall. Adventitious sounds:- No Adventitious sounds.   Cardiovascular Auscultation:Rhythm- Regular rate and rhythm. Heart Sounds- S1 WNL and S2 WNL. Murmurs & Other Heart Sounds:Auscultation of the heart reveals - No Murmurs.   Abdomen Palpation/Percussion:Tenderness- Abdomen is non-tender to palpation. Rigidity (guarding)- Abdomen is soft. Auscultation:Auscultation of the abdomen reveals - Bowel sounds normal.   Musculoskeletal She is alert and oriented. No apparent distress. The left hip shows a normal range of motion and no discomfort. The left knee shows a slight varus. Range is 5-120. There is marked crepitus on range of motion. She is tender medial greater than lateral with no instability. She has some soft tissue swelling anterolaterally. This is fluid filled and consistent with a probable ganglion cyst or meniscal cyst. This is nontender to palpation.  RADIOGRAPHS: Radiographs are reviewed , AP and lateral, and show advanced end-stage arthritis of the left knee. Bone on bone medial patellofemoral with varus deformity and osteophyte formation.  Assessment & Plan Osteoarthritis Left Knee  Patient is for a Left Total Knee Replacement by Dr. Aluisio.  Plan is for Home.  PCP - Dr. Roy Fagan  Drew Jaylenne Hamelin, PA-C  

## 2011-09-25 ENCOUNTER — Encounter (HOSPITAL_COMMUNITY): Payer: Self-pay | Admitting: Anesthesiology

## 2011-09-25 ENCOUNTER — Inpatient Hospital Stay (HOSPITAL_COMMUNITY)
Admission: RE | Admit: 2011-09-25 | Discharge: 2011-09-28 | DRG: 470 | Disposition: A | Discharge status: Home Health | Payer: Medicare Other | Source: Ambulatory Visit | Attending: Orthopedic Surgery | Admitting: Orthopedic Surgery

## 2011-09-25 ENCOUNTER — Ambulatory Visit (HOSPITAL_COMMUNITY): Payer: Medicare Other | Admitting: Anesthesiology

## 2011-09-25 ENCOUNTER — Encounter (HOSPITAL_COMMUNITY): Payer: Self-pay | Admitting: *Deleted

## 2011-09-25 ENCOUNTER — Encounter (HOSPITAL_COMMUNITY)
Admission: RE | Disposition: A | Discharge status: Home Health | Payer: Self-pay | Source: Ambulatory Visit | Attending: Orthopedic Surgery

## 2011-09-25 DIAGNOSIS — I10 Essential (primary) hypertension: Secondary | ICD-10-CM | POA: Diagnosis present

## 2011-09-25 DIAGNOSIS — I69998 Other sequelae following unspecified cerebrovascular disease: Secondary | ICD-10-CM

## 2011-09-25 DIAGNOSIS — R29898 Other symptoms and signs involving the musculoskeletal system: Secondary | ICD-10-CM | POA: Diagnosis present

## 2011-09-25 DIAGNOSIS — M1710 Unilateral primary osteoarthritis, unspecified knee: Principal | ICD-10-CM | POA: Diagnosis present

## 2011-09-25 DIAGNOSIS — E039 Hypothyroidism, unspecified: Secondary | ICD-10-CM | POA: Diagnosis present

## 2011-09-25 DIAGNOSIS — K219 Gastro-esophageal reflux disease without esophagitis: Secondary | ICD-10-CM | POA: Diagnosis present

## 2011-09-25 DIAGNOSIS — E119 Type 2 diabetes mellitus without complications: Secondary | ICD-10-CM | POA: Diagnosis present

## 2011-09-25 DIAGNOSIS — D62 Acute posthemorrhagic anemia: Secondary | ICD-10-CM | POA: Diagnosis not present

## 2011-09-25 DIAGNOSIS — Z96659 Presence of unspecified artificial knee joint: Secondary | ICD-10-CM

## 2011-09-25 DIAGNOSIS — IMO0002 Reserved for concepts with insufficient information to code with codable children: Secondary | ICD-10-CM | POA: Diagnosis not present

## 2011-09-25 DIAGNOSIS — M25569 Pain in unspecified knee: Secondary | ICD-10-CM | POA: Diagnosis not present

## 2011-09-25 DIAGNOSIS — M179 Osteoarthritis of knee, unspecified: Secondary | ICD-10-CM | POA: Diagnosis present

## 2011-09-25 HISTORY — PX: TOTAL KNEE ARTHROPLASTY: SHX125

## 2011-09-25 LAB — GLUCOSE, CAPILLARY
Glucose-Capillary: 88 mg/dL (ref 70–99)
Glucose-Capillary: 182 mg/dL — ABNORMAL HIGH (ref 70–99)

## 2011-09-25 LAB — TYPE AND SCREEN

## 2011-09-25 SURGERY — ARTHROPLASTY, KNEE, TOTAL
Anesthesia: Spinal | Site: Knee | Laterality: Left | Wound class: Clean

## 2011-09-25 MED ORDER — METOCLOPRAMIDE HCL 5 MG/ML IJ SOLN
5.0000 mg | Freq: Three times a day (TID) | INTRAMUSCULAR | Status: DC | PRN
  Administered 2011-09-26: 10 mg via INTRAVENOUS
  Filled 2011-09-25: qty 2

## 2011-09-25 MED ORDER — POLYETHYLENE GLYCOL 3350 17 G PO PACK
17.0000 g | PACK | Freq: Every day | ORAL | Status: DC | PRN
  Filled 2011-09-25: qty 1

## 2011-09-25 MED ORDER — PHENOL 1.4 % MT LIQD: 1.0000 | OROMUCOSAL | Status: DC | PRN

## 2011-09-25 MED ORDER — ATORVASTATIN CALCIUM 10 MG PO TABS
10.0000 mg | ORAL_TABLET | Freq: Every day | ORAL | Status: DC
  Administered 2011-09-26 – 2011-09-28 (×3): 10 mg via ORAL
  Filled 2011-09-25 (×4): qty 1

## 2011-09-25 MED ORDER — SODIUM CHLORIDE 0.9 % IJ SOLN: 9.0000 mL | INTRAMUSCULAR | Status: DC | PRN

## 2011-09-25 MED ORDER — ONDANSETRON HCL 4 MG/2ML IJ SOLN
INTRAMUSCULAR | Status: DC | PRN
  Administered 2011-09-25: 4 mg via INTRAVENOUS

## 2011-09-25 MED ORDER — BUPIVACAINE ON-Q PAIN PUMP (FOR ORDER SET NO CHG)
INJECTION | Status: DC
  Filled 2011-09-25: qty 1

## 2011-09-25 MED ORDER — LEVOTHYROXINE SODIUM 50 MCG PO TABS
50.0000 ug | ORAL_TABLET | Freq: Every day | ORAL | Status: DC
  Administered 2011-09-26 – 2011-09-28 (×3): 50 ug via ORAL
  Filled 2011-09-25 (×4): qty 1

## 2011-09-25 MED ORDER — BUPIVACAINE IN DEXTROSE 0.75-8.25 % IT SOLN
INTRATHECAL | Status: DC | PRN
  Administered 2011-09-25: 1.7 mL via INTRATHECAL

## 2011-09-25 MED ORDER — DIPHENHYDRAMINE HCL 50 MG/ML IJ SOLN: 12.5000 mg | Freq: Four times a day (QID) | INTRAMUSCULAR | Status: DC | PRN

## 2011-09-25 MED ORDER — LOSARTAN POTASSIUM 50 MG PO TABS
50.0000 mg | ORAL_TABLET | Freq: Every day | ORAL | Status: DC
  Administered 2011-09-28: 50 mg via ORAL
  Filled 2011-09-25 (×4): qty 1

## 2011-09-25 MED ORDER — ONDANSETRON HCL 4 MG/2ML IJ SOLN: 4.0000 mg | Freq: Four times a day (QID) | INTRAMUSCULAR | Status: DC | PRN

## 2011-09-25 MED ORDER — DIPHENHYDRAMINE HCL 12.5 MG/5ML PO ELIX: 12.5000 mg | ORAL_SOLUTION | ORAL | Status: DC | PRN

## 2011-09-25 MED ORDER — OXYCODONE HCL 5 MG PO TABS
5.0000 mg | ORAL_TABLET | ORAL | Status: DC | PRN
  Administered 2011-09-25: 5 mg via ORAL
  Administered 2011-09-26: 10 mg via ORAL
  Filled 2011-09-25: qty 2
  Filled 2011-09-25: qty 1
  Filled 2011-09-25: qty 2

## 2011-09-25 MED ORDER — METHOCARBAMOL 500 MG PO TABS
500.0000 mg | ORAL_TABLET | Freq: Four times a day (QID) | ORAL | Status: DC | PRN
  Administered 2011-09-25 – 2011-09-28 (×3): 500 mg via ORAL
  Filled 2011-09-25 (×3): qty 1

## 2011-09-25 MED ORDER — ACETAMINOPHEN 10 MG/ML IV SOLN
1000.0000 mg | Freq: Four times a day (QID) | INTRAVENOUS | Status: AC
  Administered 2011-09-25 – 2011-09-26 (×4): 1000 mg via INTRAVENOUS
  Filled 2011-09-25: qty 200
  Filled 2011-09-25 (×6): qty 100

## 2011-09-25 MED ORDER — CEFAZOLIN SODIUM 1-5 GM-% IV SOLN
1.0000 g | Freq: Four times a day (QID) | INTRAVENOUS | Status: AC
  Administered 2011-09-25 – 2011-09-26 (×3): 1 g via INTRAVENOUS
  Filled 2011-09-25 (×3): qty 50

## 2011-09-25 MED ORDER — TRAZODONE HCL 50 MG PO TABS
50.0000 mg | ORAL_TABLET | Freq: Every evening | ORAL | Status: DC | PRN
  Filled 2011-09-25: qty 2

## 2011-09-25 MED ORDER — PANTOPRAZOLE SODIUM 40 MG PO TBEC: 80.0000 mg | DELAYED_RELEASE_TABLET | Freq: Every day | ORAL | Status: DC

## 2011-09-25 MED ORDER — DIPHENHYDRAMINE HCL 12.5 MG/5ML PO ELIX
12.5000 mg | ORAL_SOLUTION | Freq: Four times a day (QID) | ORAL | Status: DC | PRN
  Filled 2011-09-25: qty 5

## 2011-09-25 MED ORDER — RIVAROXABAN 10 MG PO TABS
10.0000 mg | ORAL_TABLET | Freq: Every day | ORAL | Status: DC
  Administered 2011-09-26 – 2011-09-28 (×3): 10 mg via ORAL
  Filled 2011-09-25 (×4): qty 1

## 2011-09-25 MED ORDER — FLEET ENEMA 7-19 GM/118ML RE ENEM: 1.0000 | ENEMA | Freq: Once | RECTAL | Status: AC | PRN

## 2011-09-25 MED ORDER — DIPHENHYDRAMINE HCL 12.5 MG/5ML PO ELIX: 12.5000 mg | ORAL_SOLUTION | Freq: Four times a day (QID) | ORAL | Status: DC | PRN

## 2011-09-25 MED ORDER — PHENYLEPHRINE HCL 10 MG/ML IJ SOLN
INTRAMUSCULAR | Status: DC | PRN
  Administered 2011-09-25 (×2): 40 ug via INTRAVENOUS

## 2011-09-25 MED ORDER — NALOXONE HCL 0.4 MG/ML IJ SOLN: 0.4000 mg | INTRAMUSCULAR | Status: DC | PRN

## 2011-09-25 MED ORDER — ONDANSETRON HCL 4 MG/2ML IJ SOLN
4.0000 mg | Freq: Four times a day (QID) | INTRAMUSCULAR | Status: DC | PRN
  Administered 2011-09-26 (×2): 4 mg via INTRAVENOUS
  Filled 2011-09-25 (×2): qty 2

## 2011-09-25 MED ORDER — TRAMADOL HCL 50 MG PO TABS: 50.0000 mg | ORAL_TABLET | Freq: Four times a day (QID) | ORAL | Status: DC | PRN

## 2011-09-25 MED ORDER — DEXAMETHASONE SODIUM PHOSPHATE 10 MG/ML IJ SOLN
10.0000 mg | Freq: Once | INTRAMUSCULAR | Status: AC
  Administered 2011-09-25: 10 mg via INTRAVENOUS
  Filled 2011-09-25: qty 1

## 2011-09-25 MED ORDER — ZOLPIDEM TARTRATE 5 MG PO TABS: 5.0000 mg | ORAL_TABLET | Freq: Every evening | ORAL | Status: DC | PRN

## 2011-09-25 MED ORDER — FENTANYL CITRATE 0.05 MG/ML IJ SOLN
INTRAMUSCULAR | Status: DC | PRN
Start: 2011-09-25 — End: 2011-09-25
  Administered 2011-09-25 (×2): 50 ug via INTRAVENOUS

## 2011-09-25 MED ORDER — CEFAZOLIN SODIUM 1-5 GM-% IV SOLN
1.0000 g | INTRAVENOUS | Status: AC
  Administered 2011-09-25: 1 g via INTRAVENOUS

## 2011-09-25 MED ORDER — MENTHOL 3 MG MT LOZG: 1.0000 | LOZENGE | OROMUCOSAL | Status: DC | PRN

## 2011-09-25 MED ORDER — ACETAMINOPHEN 650 MG RE SUPP: 650.0000 mg | Freq: Four times a day (QID) | RECTAL | Status: DC | PRN

## 2011-09-25 MED ORDER — ACETAMINOPHEN 325 MG PO TABS: 650.0000 mg | ORAL_TABLET | Freq: Four times a day (QID) | ORAL | Status: DC | PRN

## 2011-09-25 MED ORDER — BUPIVACAINE 0.25 % ON-Q PUMP DUAL CATH 300 ML
INJECTION | Status: DC | PRN
  Administered 2011-09-25: 300 mL

## 2011-09-25 MED ORDER — MORPHINE SULFATE (PF) 1 MG/ML IV SOLN: INTRAVENOUS | Status: DC

## 2011-09-25 MED ORDER — SODIUM CHLORIDE 0.9 % IV SOLN
INTRAVENOUS | Status: DC
  Administered 2011-09-25 – 2011-09-26 (×2): via INTRAVENOUS

## 2011-09-25 MED ORDER — MORPHINE SULFATE (PF) 1 MG/ML IV SOLN
INTRAVENOUS | Status: DC
  Administered 2011-09-26: 05:00:00 via INTRAVENOUS
  Filled 2011-09-25: qty 25

## 2011-09-25 MED ORDER — HYDROMORPHONE HCL PF 1 MG/ML IJ SOLN
INTRAMUSCULAR | Status: AC
  Filled 2011-09-25: qty 1

## 2011-09-25 MED ORDER — HYDROMORPHONE HCL PF 1 MG/ML IJ SOLN
0.2500 mg | INTRAMUSCULAR | Status: DC | PRN
  Administered 2011-09-25 (×2): 0.5 mg via INTRAVENOUS

## 2011-09-25 MED ORDER — LACTATED RINGERS IV SOLN
INTRAVENOUS | Status: DC | PRN
  Administered 2011-09-25 (×2): via INTRAVENOUS

## 2011-09-25 MED ORDER — ACETAMINOPHEN 10 MG/ML IV SOLN
1000.0000 mg | Freq: Once | INTRAVENOUS | Status: AC
  Administered 2011-09-25: 1000 mg via INTRAVENOUS
  Filled 2011-09-25: qty 100

## 2011-09-25 MED ORDER — CHLORHEXIDINE GLUCONATE 4 % EX LIQD
60.0000 mL | Freq: Once | CUTANEOUS | Status: DC
  Filled 2011-09-25: qty 60

## 2011-09-25 MED ORDER — BISACODYL 10 MG RE SUPP: 10.0000 mg | Freq: Every day | RECTAL | Status: DC | PRN

## 2011-09-25 MED ORDER — DOCUSATE SODIUM 100 MG PO CAPS
100.0000 mg | ORAL_CAPSULE | Freq: Two times a day (BID) | ORAL | Status: DC
  Filled 2011-09-25 (×7): qty 1

## 2011-09-25 MED ORDER — METOCLOPRAMIDE HCL 10 MG PO TABS
5.0000 mg | ORAL_TABLET | Freq: Three times a day (TID) | ORAL | Status: DC | PRN
  Administered 2011-09-26: 10 mg via ORAL
  Filled 2011-09-25: qty 2

## 2011-09-25 MED ORDER — PANTOPRAZOLE SODIUM 40 MG PO TBEC
80.0000 mg | DELAYED_RELEASE_TABLET | Freq: Every day | ORAL | Status: DC
  Filled 2011-09-25: qty 2

## 2011-09-25 MED ORDER — TEMAZEPAM 15 MG PO CAPS: 15.0000 mg | ORAL_CAPSULE | Freq: Every evening | ORAL | Status: DC | PRN

## 2011-09-25 MED ORDER — ACETAMINOPHEN 10 MG/ML IV SOLN
INTRAVENOUS | Status: AC
  Filled 2011-09-25: qty 100

## 2011-09-25 MED ORDER — MIDAZOLAM HCL 5 MG/5ML IJ SOLN
INTRAMUSCULAR | Status: DC | PRN
  Administered 2011-09-25 (×2): 1 mg via INTRAVENOUS

## 2011-09-25 MED ORDER — CEFAZOLIN SODIUM 1-5 GM-% IV SOLN
INTRAVENOUS | Status: AC
  Filled 2011-09-25: qty 50

## 2011-09-25 MED ORDER — KETAMINE HCL 10 MG/ML IJ SOLN
INTRAMUSCULAR | Status: DC | PRN
  Administered 2011-09-25 (×2): 10 mg via INTRAVENOUS

## 2011-09-25 MED ORDER — MORPHINE SULFATE (PF) 1 MG/ML IV SOLN
INTRAVENOUS | Status: AC
  Administered 2011-09-25: 10 mg via INTRAVENOUS
  Filled 2011-09-25: qty 25

## 2011-09-25 MED ORDER — SODIUM CHLORIDE 0.9 % IR SOLN
Status: DC | PRN
  Administered 2011-09-25: 1000 mL

## 2011-09-25 MED ORDER — MORPHINE SULFATE (PF) 1 MG/ML IV SOLN
INTRAVENOUS | Status: DC
  Administered 2011-09-25: 1 mg via INTRAVENOUS

## 2011-09-25 MED ORDER — PROPOFOL 10 MG/ML IV EMUL
INTRAVENOUS | Status: DC | PRN
  Administered 2011-09-25: 25 ug/kg/min via INTRAVENOUS

## 2011-09-25 MED ORDER — METHOCARBAMOL 100 MG/ML IJ SOLN
500.0000 mg | Freq: Four times a day (QID) | INTRAVENOUS | Status: DC | PRN
  Administered 2011-09-25: 500 mg via INTRAVENOUS
  Filled 2011-09-25 (×3): qty 5

## 2011-09-25 MED ORDER — CYCLOSPORINE 0.05 % OP EMUL
1.0000 [drp] | Freq: Two times a day (BID) | OPHTHALMIC | Status: DC
  Administered 2011-09-25 – 2011-09-28 (×6): 1 [drp] via OPHTHALMIC
  Filled 2011-09-25 (×7): qty 1

## 2011-09-25 MED ORDER — CITALOPRAM HYDROBROMIDE 20 MG PO TABS
20.0000 mg | ORAL_TABLET | Freq: Every day | ORAL | Status: DC
Start: 2011-09-26 — End: 2011-09-28
  Administered 2011-09-26 – 2011-09-28 (×3): 20 mg via ORAL
  Filled 2011-09-25 (×4): qty 1

## 2011-09-25 MED ORDER — ONDANSETRON HCL 4 MG/2ML IJ SOLN
4.0000 mg | Freq: Four times a day (QID) | INTRAMUSCULAR | Status: DC | PRN
  Filled 2011-09-25: qty 2

## 2011-09-25 MED ORDER — ONDANSETRON HCL 4 MG PO TABS: 4.0000 mg | ORAL_TABLET | Freq: Four times a day (QID) | ORAL | Status: DC | PRN

## 2011-09-25 MED ORDER — ALPRAZOLAM 0.25 MG PO TABS
0.2500 mg | ORAL_TABLET | Freq: Two times a day (BID) | ORAL | Status: DC | PRN
  Administered 2011-09-26: 0.25 mg via ORAL
  Filled 2011-09-25: qty 1

## 2011-09-25 SURGICAL SUPPLY — 50 items
BAG ZIPLOCK 12X15 (MISCELLANEOUS) ×2 IMPLANT
BANDAGE ELASTIC 6 VELCRO ST LF (GAUZE/BANDAGES/DRESSINGS) ×2 IMPLANT
BANDAGE ESMARK 6X9 LF (GAUZE/BANDAGES/DRESSINGS) ×1 IMPLANT
BLADE SAG 18X100X1.27 (BLADE) ×2 IMPLANT
BLADE SAW SGTL 11.0X1.19X90.0M (BLADE) ×2 IMPLANT
BNDG ESMARK 6X9 LF (GAUZE/BANDAGES/DRESSINGS) ×2
BOWL SMART MIX CTS (DISPOSABLE) ×2 IMPLANT
CATH KIT ON-Q SILVERSOAK 5IN (CATHETERS) ×2 IMPLANT
CEMENT HV SMART SET (Cement) ×4 IMPLANT
CLOTH BEACON ORANGE TIMEOUT ST (SAFETY) ×2 IMPLANT
CUFF TOURN SGL QUICK 34 (TOURNIQUET CUFF) ×1
CUFF TRNQT CYL 34X4X40X1 (TOURNIQUET CUFF) ×1 IMPLANT
DRAPE EXTREMITY T 121X128X90 (DRAPE) ×2 IMPLANT
DRAPE POUCH INSTRU U-SHP 10X18 (DRAPES) ×2 IMPLANT
DRAPE U-SHAPE 47X51 STRL (DRAPES) ×2 IMPLANT
DRSG ADAPTIC 3X8 NADH LF (GAUZE/BANDAGES/DRESSINGS) ×2 IMPLANT
DURAPREP 26ML APPLICATOR (WOUND CARE) ×2 IMPLANT
ELECT REM PT RETURN 9FT ADLT (ELECTROSURGICAL) ×2
ELECTRODE REM PT RTRN 9FT ADLT (ELECTROSURGICAL) ×1 IMPLANT
EVACUATOR 1/8 PVC DRAIN (DRAIN) ×2 IMPLANT
FACESHIELD LNG OPTICON STERILE (SAFETY) ×10 IMPLANT
GLOVE BIO SURGEON STRL SZ7.5 (GLOVE) ×2 IMPLANT
GLOVE BIO SURGEON STRL SZ8 (GLOVE) ×2 IMPLANT
GLOVE BIOGEL PI IND STRL 8 (GLOVE) ×2 IMPLANT
GLOVE BIOGEL PI INDICATOR 8 (GLOVE) ×2
GOWN STRL NON-REIN LRG LVL3 (GOWN DISPOSABLE) ×2 IMPLANT
GOWN STRL REIN XL XLG (GOWN DISPOSABLE) ×2 IMPLANT
HANDPIECE INTERPULSE COAX TIP (DISPOSABLE) ×1
IMMOBILIZER KNEE 20 (SOFTGOODS) ×2
IMMOBILIZER KNEE 20 THIGH 36 (SOFTGOODS) ×1 IMPLANT
KIT BASIN OR (CUSTOM PROCEDURE TRAY) ×2 IMPLANT
MANIFOLD NEPTUNE II (INSTRUMENTS) ×2 IMPLANT
NS IRRIG 1000ML POUR BTL (IV SOLUTION) ×2 IMPLANT
PACK TOTAL JOINT (CUSTOM PROCEDURE TRAY) ×2 IMPLANT
PAD ABD 7.5X8 STRL (GAUZE/BANDAGES/DRESSINGS) ×2 IMPLANT
PADDING CAST COTTON 6X4 STRL (CAST SUPPLIES) ×6 IMPLANT
POSITIONER SURGICAL ARM (MISCELLANEOUS) ×2 IMPLANT
SET HNDPC FAN SPRY TIP SCT (DISPOSABLE) ×1 IMPLANT
SPONGE GAUZE 4X4 12PLY (GAUZE/BANDAGES/DRESSINGS) ×2 IMPLANT
STRIP CLOSURE SKIN 1/2X4 (GAUZE/BANDAGES/DRESSINGS) ×4 IMPLANT
SUCTION FRAZIER 12FR DISP (SUCTIONS) ×2 IMPLANT
SUT MNCRL AB 4-0 PS2 18 (SUTURE) ×2 IMPLANT
SUT PDS AB 1 CT1 27 (SUTURE) ×6 IMPLANT
SUT VIC AB 2-0 CT1 27 (SUTURE) ×3
SUT VIC AB 2-0 CT1 TAPERPNT 27 (SUTURE) ×3 IMPLANT
SUT VLOC 180 0 24IN GS25 (SUTURE) ×2 IMPLANT
TOWEL OR 17X26 10 PK STRL BLUE (TOWEL DISPOSABLE) ×4 IMPLANT
TRAY FOLEY CATH 14FRSI W/METER (CATHETERS) ×2 IMPLANT
WATER STERILE IRR 1500ML POUR (IV SOLUTION) ×2 IMPLANT
WRAP KNEE MAXI GEL POST OP (GAUZE/BANDAGES/DRESSINGS) ×4 IMPLANT

## 2011-09-25 NOTE — H&P (View-Only) (Signed)
Angela Galvan  DOB: February 26, 1933 Married / Language: English / Race: White / Female  Date of Admission:  09/25/2011  Chief Complaint:    History of Present Illness The patient is a 78 year old female who comes for a preoperative History and Physical. The patient is scheduled for a left total knee arthroplasty to be performed by Dr. Gus Galvan. Aluisio, MD at [facility] on 09/25/2011.  The patient is a 78 year old female who presents with knee complaints. The patient was seen in referral from Dr. Rennis Galvan. The patient reports left knee symptoms including: pain and swelling. [name] states her left knee has been bothering her for a long time now. The pain is occurring with activity and at rest. This is limiting what she can and can not do. She states the knee does hurt at night. She has had her right knee replaced and has done well with that. She is ready to get the left knee replacement. They have been treated conservatively in the past for the above stated problem and despite conservative measures, they continue to have progressive pain and severe functional limitations and dysfunction. They have failed non-operative management including home exercise, medications, and injections. It is felt that they would benefit from undergoing total joint replacement. Risks and benefits of the procedure have been discussed with the patient and they elect to proceed with surgery. There are no active contraindications to surgery such as ongoing infection or rapidly progressive neurological disease.  Allergies No Known Drug Allergies. 01/26/2011  Medication History Synthroid ( Oral) Specific dose unknown - Active. Atorvastatin Calcium ( Oral) Specific dose unknown - Active. Citalopram Hydrobromide ( Oral) Specific dose unknown - Active. Losartan Potassium ( Oral) Specific dose unknown - Active. Aspirin (325MG  Tablet, 1 Oral) Active. NexIUM (40MG  Capsule DR, Oral) Active. Align ( Oral) Specific dose  unknown - Active. Cholestyramine Active. Tylenol (1 Oral) Specific dose unknown - Active. ALPRAZolam (0.25MG  Tablet, Oral) Active. TraZODone HCl (50MG  Tablet, Oral) Active. TraMADol HCl (50MG  Tablet, Oral) Active.  Problem List/Past Medical Lumbago (724.2) Osteoarthritis, knee (715.96) Degeneration, lumbar/lumbosacral disc (722.52) Hypothyroidism Irritable bowel syndrome Osteoarthritis High blood pressure Hypercholesterolemia Stroke. March 2008 (some lower extremity weakness) Hiatal Hernia Gastroesophageal Reflux Disease Diet-Controlled Diabetes Mellitus Menopause Diverticulosis  Past Surgical History Gallbladder Surgery. Date: 67. open Hysterectomy. Date: 53. complete (non-cancerous) Total Knee Replacement. Date: 2004. right Cataract Surgery. bilateral: Left - 2008, Right - 2010 Colon Polyp Removal - Colonoscopy Foot Surgery. Date: 2006. right Tumor Excision Right Ovary. 1950's Colon Removal - Partial. Date: 1991. Diverticulitis Oophorectomy; Unilateral. Right, 1950's  Family History Heart Disease. mother and father Osteoarthritis. mother Cerebrovascular Accident. mother, sister and grandmother mothers side Diabetes Mellitus. father Father. Deceased, Myocardial infarction. age 59 Mother. Deceased, Cerebrovascular Accident.  Social History Illicit drug use. no Exercise. Exercises rarely Marital status. married Tobacco use. Never smoker. never smoker Pain Contract. no Children. 1 Alcohol use. 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 no Post-Surgical Plans. Plan is to go home. Advance Directives. Living Will, Healthcare POA  Review of Systems General:Not Present- Chills, Fever, Night Sweats, Fatigue, Weight Gain, Weight Loss and Memory Loss. Skin:Not Present- Hives, Itching, Rash, Eczema and Lesions. HEENT:Not Present- Tinnitus, Headache, Double Vision,  Visual Loss, Hearing Loss and Dentures. Respiratory:Not Present- Shortness of breath with exertion, Shortness of breath at rest, Allergies, Coughing up blood and Chronic Cough. Cardiovascular:Not Present- Chest Pain, Racing/skipping heartbeats, Difficulty Breathing Lying Down, Murmur, Swelling and Palpitations. Gastrointestinal:Present-  Diarrhea. Not Present- Bloody Stool, Heartburn, Abdominal Pain, Vomiting, Nausea, Constipation, Difficulty Swallowing, Jaundice and Loss of appetitie. Female Genitourinary:Not Present- Blood in Urine, Urinary frequency, Weak urinary stream, Discharge, Flank Pain, Incontinence, Painful Urination, Urgency, Urinary Retention and Urinating at Night. Musculoskeletal:Present- Joint Swelling and Joint Pain. Not Present- Muscle Weakness, Muscle Pain, Back Pain, Morning Stiffness and Spasms. Neurological:Not Present- Tremor, Dizziness, Blackout spells, Paralysis, Difficulty with balance and Weakness. Psychiatric:Not Present- Insomnia.  Vitals Weight: 117 lb Height: 60 in Body Surface Area: 1.5 m Body Mass Index: 22.85 kg/m Temp.: 68 FResp.: 12 (Unlabored) BP: 108/52 (Sitting, Left Arm, Standard)  Physical Exam The physical exam findings are as follows: Patient is accompanied today by husband Angela Galvan. Pateint is a 78 year old female with continued knee pain.   General Mental Status - Alert, cooperative and good historian. General Appearance- pleasant. Not in acute distress. Orientation- Oriented X3. Build & Nutrition- Well nourished and Well developed.   Head and Neck Head- normocephalic, atraumatic . Neck Global Assessment- supple. no bruit auscultated on the right and no bruit auscultated on the left.   Eye Pupil- Bilateral- Regular and Round. Note: wears glasses Motion- Bilateral- EOMI.   Chest and Lung Exam Auscultation: Breath sounds:- clear at anterior chest wall and - clear at posterior chest  wall. Adventitious sounds:- No Adventitious sounds.   Cardiovascular Auscultation:Rhythm- Regular rate and rhythm. Heart Sounds- S1 WNL and S2 WNL. Murmurs & Other Heart Sounds:Auscultation of the heart reveals - No Murmurs.   Abdomen Palpation/Percussion:Tenderness- Abdomen is non-tender to palpation. Rigidity (guarding)- Abdomen is soft. Auscultation:Auscultation of the abdomen reveals - Bowel sounds normal.   Musculoskeletal She is alert and oriented. No apparent distress. The left hip shows a normal range of motion and no discomfort. The left knee shows a slight varus. Range is 5-120. There is marked crepitus on range of motion. She is tender medial greater than lateral with no instability. She has some soft tissue swelling anterolaterally. This is fluid filled and consistent with a probable ganglion cyst or meniscal cyst. This is nontender to palpation.  RADIOGRAPHS: Radiographs are reviewed , AP and lateral, and show advanced end-stage arthritis of the left knee. Bone on bone medial patellofemoral with varus deformity and osteophyte formation.  Assessment & Plan Osteoarthritis Left Knee  Patient is for a Left Total Knee Replacement by Dr. Lequita Halt.  Plan is for Home.  PCP - Dr. Gaynelle Adu, PA-C

## 2011-09-25 NOTE — Op Note (Signed)
Pre-operative diagnosis- Osteoarthritis  Left knee(s)  Post-operative diagnosis- Osteoarthritis Left knee(s)  Procedure-  Left  Total Knee Arthroplasty  Surgeon- Gus Rankin. Valli Randol, MD  Assistant- Avel Peace, PA-C   Anesthesia-  Spinal EBL-* No blood loss amount entered *  Drains Hemovac  Tourniquet time-  Total Tourniquet Time Documented: Thigh (Left) - 29 minutes   Complications- None  Condition-PACU - hemodynamically stable.   Brief Clinical Note  Angela Galvan is a 78 y.o. year old female with end stage OA of her left knee with progressively worsening pain and dysfunction. She has constant pain, with activity and at rest and significant functional deficits with difficulties even with ADLs. She has had extensive non-op management including analgesics, injections of cortisone and viscosupplements, and home exercise program, but remains in significant pain with significant dysfunction. Radiographs show bone on bone arthritis medial and patellofemoral with tibial subluxation. She presents now for left Total Knee Arthroplasty.     Procedure in detail---   The patient is brought into the operating room and positioned supine on the operating table. After successful administration of  Spinal,   a tourniquet is placed high on the  Left thigh(s) and the lower extremity is prepped and draped in the usual sterile fashion. Time out is performed by the operating team and then the  Left lower extremity is wrapped in Esmarch, knee flexed and the tourniquet inflated to 300 mmHg.       A midline incision is made with a ten blade through the subcutaneous tissue to the level of the extensor mechanism. A fresh blade is used to make a medial parapatellar arthrotomy. Soft tissue over the proximal medial tibia is subperiosteally elevated to the joint line with a knife and into the semimembranosus bursa with a Cobb elevator. Soft tissue over the proximal lateral tibia is elevated with attention being paid to  avoiding the patellar tendon on the tibial tubercle. The patella is everted, knee flexed 90 degrees and the ACL and PCL are removed. Findings are bone on bone medial and patellofemoral with large osteophytes        The drill is used to create a starting hole in the distal femur and the canal is thoroughly irrigated with sterile saline to remove the fatty contents. The 5 degree Left  valgus alignment guide is placed into the femoral canal and the distal femoral cutting block is pinned to remove 11 mm off the distal femur. Resection is made with an oscillating saw.      The tibia is subluxed forward and the menisci are removed. The extramedullary alignment guide is placed referencing proximally at the medial aspect of the tibial tubercle and distally along the second metatarsal axis and tibial crest. The block is pinned to remove 2mm off the more deficient medial  side. Resection is made with an oscillating saw. Size 2is the most appropriate size for the tibia and the proximal tibia is prepared with the modular drill and keel punch for that size.      The femoral sizing guide is placed and size 2 is most appropriate. Rotation is marked off the epicondylar axis and confirmed by creating a rectangular flexion gap at 90 degrees. The size 2 cutting block is pinned in this rotation and the anterior, posterior and chamfer cuts are made with the oscillating saw. The intercondylar block is then placed and that cut is made.      Trial size 2 tibial component, trial size 2 posterior stabilized femur and  a 12.5  mm posterior stabilized rotating platform insert trial is placed. Full extension is achieved with excellent varus/valgus and anterior/posterior balance throughout full range of motion. The patella is everted and thickness measured to be 22  mm. Free hand resection is taken to 12 mm, a 35 template is placed, lug holes are drilled, trial patella is placed, and it tracks normally. Osteophytes are removed off the  posterior femur with the trial in place. All trials are removed and the cut bone surfaces prepared with pulsatile lavage. Cement is mixed and once ready for implantation, the size 2 tibial implant, size  2 posterior stabilized femoral component, and the size 35 patella are cemented in place and the patella is held with the clamp. The trial insert is placed and the knee held in full extension. All extruded cement is removed and once the cement is hard the permanent 12.5 mm posterior stabilized rotating platform insert is placed into the tibial tray.      The wound is copiously irrigated with saline solution and the extensor mechanism closed over a hemovac drain with #1 PDS suture. The tourniquet is released for a total tourniquet time of 29  minutes. Flexion against gravity is 140 degrees and the patella tracks normally. Subcutaneous tissue is closed with 2.0 vicryl and subcuticular with running 4.0 Monocryl. The catheter for the Marcaine pain pump is placed and the pump is initiated. The incision is cleaned and dried and steri-strips and a bulky sterile dressing are applied. The limb is placed into a knee immobilizer and the patient is awakened and transported to recovery in stable condition.      Please note that a surgical assistant was a medical necessity for this procedure in order to perform it in a safe and expeditious manner. Surgical assistant was necessary to retract the ligaments and vital neurovascular structures to prevent injury to them and also necessary for proper positioning of the limb to allow for anatomic placement of the prosthesis.   Gus Rankin Deanthony Maull, MD    09/25/2011, 9:26 AM

## 2011-09-25 NOTE — Preoperative (Signed)
Beta Blockers   Reason not to administer Beta Blockers:Not Applicable 

## 2011-09-25 NOTE — Transfer of Care (Signed)
Immediate Anesthesia Transfer of Care Note  Patient: Angela Galvan  Procedure(s) Performed: Procedure(s) (LRB): TOTAL KNEE ARTHROPLASTY (Left)  Patient Location: PACU  Anesthesia Type: MAC and Spinal  Level of Consciousness: awake, alert , oriented and patient cooperative  Airway & Oxygen Therapy: Patient Spontanous Breathing and Patient connected to face mask oxygen  Post-op Assessment: Report given to PACU RN and Post -op Vital signs reviewed and stable  Post vital signs: Reviewed and stable  Complications: No apparent anesthesia complications

## 2011-09-25 NOTE — Anesthesia Preprocedure Evaluation (Addendum)
Anesthesia Evaluation  Patient identified by MRN, date of birth, ID band Patient awake    Reviewed: Allergy & Precautions, H&P , NPO status , Patient's Chart, lab work & pertinent test results  Airway Mallampati: II TM Distance: >3 FB Neck ROM: Full    Dental No notable dental hx.    Pulmonary  CXR: No active disease. breath sounds clear to auscultation  Pulmonary exam normal       Cardiovascular Exercise Tolerance: Good hypertension, Pt. on medications Rhythm:Regular Rate:Normal     Neuro/Psych Leg weakness following stroke 2008. Both legs are weak. H/O lumbago. CVA, Residual Symptoms negative psych ROS   GI/Hepatic Neg liver ROS, GERD-  Medicated,  Endo/Other  Diabetes mellitus-, Type 2Hypothyroidism   Renal/GU negative Renal ROS  negative genitourinary   Musculoskeletal negative musculoskeletal ROS (+)   Abdominal   Peds negative pediatric ROS (+)  Hematology negative hematology ROS (+)   Anesthesia Other Findings   Reproductive/Obstetrics negative OB ROS                         Anesthesia Physical Anesthesia Plan  ASA: III  Anesthesia Plan: Spinal   Post-op Pain Management:    Induction: Intravenous  Airway Management Planned:   Additional Equipment:   Intra-op Plan:   Post-operative Plan:   Informed Consent: I have reviewed the patients History and Physical, chart, labs and discussed the procedure including the risks, benefits and alternatives for the proposed anesthesia with the patient or authorized representative who has indicated his/her understanding and acceptance.   Dental advisory given  Plan Discussed with: CRNA  Anesthesia Plan Comments: (Discussed risks/benefits of spinal(versus general) including headache, backache, failure, bleeding, infection, and nerve damage. Patient consents to spinal. Questions answered. Coagulation studies and platelet count acceptable.  Both legs with slight weakness secondary to stroke. Chronic redness, bruising and pain in right foot. )        Anesthesia Quick Evaluation

## 2011-09-25 NOTE — Interval H&P Note (Signed)
History and Physical Interval Note:  09/25/2011 8:24 AM  Angela Galvan  has presented today for surgery, with the diagnosis of Osteoarthritis of the Left Knee  The various methods of treatment have been discussed with the patient and family. After consideration of risks, benefits and other options for treatment, the patient has consented to  Procedure(s) (LRB): TOTAL KNEE ARTHROPLASTY (Left) as a surgical intervention .  The patients' history has been reviewed, patient examined, no change in status, stable for surgery.  I have reviewed the patients' chart and labs.  Questions were answered to the patient's satisfaction.     [name]

## 2011-09-25 NOTE — Anesthesia Postprocedure Evaluation (Signed)
  Anesthesia Post-op Note  Patient: Angela Galvan  Procedure(s) Performed: Procedure(s) (LRB): TOTAL KNEE ARTHROPLASTY (Left)  Patient Location: PACU  Anesthesia Type: Spinal  Level of Consciousness: awake and alert   Airway and Oxygen Therapy: Patient Spontanous Breathing  Post-op Pain: mild  Post-op Assessment: Post-op Vital signs reviewed, Patient's Cardiovascular Status Stable, Respiratory Function Stable, Patent Airway and No signs of Nausea or vomiting  Post-op Vital Signs: stable  Complications: No apparent anesthesia complications. Spinal is resolving. Moving both feet

## 2011-09-25 NOTE — Anesthesia Procedure Notes (Signed)
Spinal  Patient location during procedure: OR Staffing Anesthesiologist: Sherrian Divers J Preanesthetic Checklist Completed: patient identified, site marked, surgical consent, pre-op evaluation, timeout performed, IV checked, risks and benefits discussed and monitors and equipment checked Spinal Block Patient position: sitting Prep: Betadine Approach: left paramedian Location: L3-4 Needle Needle type: Quincke  Needle gauge: 22 G Additional Notes Tolerated well. No paresthesia. No heme.

## 2011-09-26 ENCOUNTER — Encounter (HOSPITAL_COMMUNITY): Payer: Self-pay | Admitting: Orthopedic Surgery

## 2011-09-26 LAB — BASIC METABOLIC PANEL
BUN: 15 mg/dL (ref 6–23)
Calcium: 8.5 mg/dL (ref 8.4–10.5)
Creatinine, Ser: 0.89 mg/dL (ref 0.50–1.10)
GFR calc Af Amer: 71 mL/min — ABNORMAL LOW (ref >90)
GFR calc non Af Amer: 61 mL/min — ABNORMAL LOW (ref >90)
Glucose, Bld: 135 mg/dL — ABNORMAL HIGH (ref 70–99)

## 2011-09-26 LAB — CBC
HCT: 29.2 % — ABNORMAL LOW (ref 36.0–46.0)
Hemoglobin: 10 g/dL — ABNORMAL LOW (ref 12.0–15.0)
MCH: 30.5 pg (ref 26.0–34.0)
MCHC: 34.2 g/dL (ref 30.0–36.0)
MCV: 89 fL (ref 78.0–100.0)
RDW: 12.6 % (ref 11.5–15.5)

## 2011-09-26 LAB — GLUCOSE, CAPILLARY: Glucose-Capillary: 115 mg/dL — ABNORMAL HIGH (ref 70–99)

## 2011-09-26 MED ORDER — PROMETHAZINE HCL 25 MG PO TABS
12.5000 mg | ORAL_TABLET | Freq: Four times a day (QID) | ORAL | Status: DC | PRN
  Administered 2011-09-26: 12.5 mg via ORAL
  Filled 2011-09-26: qty 1

## 2011-09-26 MED ORDER — NON FORMULARY: 40.0000 mg | Freq: Every day | Status: DC

## 2011-09-26 MED ORDER — HYDROCODONE-ACETAMINOPHEN 5-325 MG PO TABS
1.0000 | ORAL_TABLET | ORAL | Status: DC | PRN
  Administered 2011-09-26 – 2011-09-27 (×2): 1 via ORAL
  Administered 2011-09-27 (×2): 2 via ORAL
  Administered 2011-09-28: 1 via ORAL
  Filled 2011-09-26 (×3): qty 1
  Filled 2011-09-26 (×2): qty 2

## 2011-09-26 MED ORDER — HYDROMORPHONE HCL PF 1 MG/ML IJ SOLN: 0.5000 mg | INTRAMUSCULAR | Status: DC | PRN

## 2011-09-26 MED ORDER — ESOMEPRAZOLE MAGNESIUM 40 MG PO CPDR
40.0000 mg | DELAYED_RELEASE_CAPSULE | Freq: Every day | ORAL | Status: DC
  Administered 2011-09-26 – 2011-09-27 (×2): 40 mg via ORAL
  Filled 2011-09-26 (×3): qty 1

## 2011-09-26 NOTE — Plan of Care (Signed)
Problem: Consults Goal: Diagnosis- Total Joint Replacement Primary Total Knee     

## 2011-09-26 NOTE — Progress Notes (Signed)
Subjective: 1 Day Post-Op Procedure(s) (LRB): TOTAL KNEE ARTHROPLASTY (Left) Patient reports pain as mild.   Patient seen in rounds with Dr. Lequita Halt. Patient is having problems with nausea/vomiting so we will change meds We will start therapy today.  Plan is to go Home after hospital stay.  Objective: Vital signs in last 24 hours: Temp:  [97.8 F (36.6 C)-98.6 F (37 C)] 98.3 F (36.8 C) (04/30 0650) Pulse Rate:  [73-80] 73  (04/30 0650) Resp:  [11-20] 16  (04/30 0650) BP: (91-159)/(53-79) 150/65 mmHg (04/30 0650) SpO2:  [95 %-100 %] 100 % (04/30 0650) Weight:  [53.071 kg (117 lb)] 53.071 kg (117 lb) (04/29 1500)  Intake/Output from previous day:  Intake/Output Summary (Last 24 hours) at 09/26/11 0802 Last data filed at 09/26/11 0650  Gross per 24 hour  Intake 3889.5 ml  Output   3010 ml  Net  879.5 ml    Intake/Output this shift:    Labs:  Basename 09/26/11 0425  HGB 10.0*    Basename 09/26/11 0425  WBC 14.0*  RBC 3.28*  HCT 29.2*  PLT 321    Basename 09/26/11 0425  NA 136  K 4.5  CL 103  CO2 26  BUN 15  CREATININE 0.89  GLUCOSE 135*  CALCIUM 8.5   No results found for this basename: LABPT:2,INR:2 in the last 72 hours  EXAM General - Patient is Alert, Appropriate and Oriented Extremity - Neurovascular intact Sensation intact distally Dressing - dressing C/D/I Motor Function - intact, moving foot and toes well on exam.  Hemovac pulled without difficulty.  Past Medical History  Diagnosis Date  . Diverticulitis     s/p colectomy in 1991  . IBS (irritable bowel syndrome)   . HTN (hypertension)   . Hyperlipidemia   . S/P colonoscopy 2003    minimal internal hemorrhoids, pancolonic diverticula, biopsy of rectum: lymphoplasmacytic microscopic colitis, colon biopsies normal  . S/P colonoscopy 2012    pancolonic diverticula, nl TI, external hemorrhoidal tag,/rectum bx no abnormalities  . S/P endoscopy 2012    normal esophagus, stenotic pyloric  channel, TTS dilation  . Diarrhea   . GERD (gastroesophageal reflux disease)   . Arthritis   . Hypothyroidism   . Diabetes mellitus     no meds.-diet controlled  . Stroke 08/10/2006    no residual    Assessment/Plan: 1 Day Post-Op Procedure(s) (LRB): TOTAL KNEE ARTHROPLASTY (Left) Principal Problem:  *OA (osteoarthritis) of knee   Advance diet Up with therapy Continue foley due to strict I&O and urinary output monitoring Discharge home with home health  DVT Prophylaxis - Xarelto Weight-Bearing as tolerated to left leg Keep foley until tomorrow. No vaccines. D/C PCA, Change to IV push D/C O2 and Pulse OX and try on Room Air  PERKINS, ALEXZANDREW 09/26/2011, 8:02 AM

## 2011-09-26 NOTE — Evaluation (Signed)
Physical Therapy Evaluation Patient Details Name: Angela Galvan MRN: [id number] DOB: 07-28-1932 Today's Date: 09/26/2011 Time: 7829-5621 PT Time Calculation (min): 25 min  PT Assessment / Plan / Recommendation Clinical Impression  Pt s/p L TKA.  Pt would benefit from acute PT services in order to improve strength and ROM of L LE to increase independence with ambulation and stairs in preparation for d/c home with spouse.    PT Assessment  Patient needs continued PT services    Follow Up Recommendations  Home health PT    Equipment Recommendations  Rolling walker with 5" wheels;Other (comment) (youth RW)    Frequency 7X/week    Precautions / Restrictions Precautions Precautions: Knee Required Braces or Orthoses: Knee Immobilizer - Left Knee Immobilizer - Left: Discontinue once straight leg raise with < 10 degree lag Restrictions LLE Weight Bearing: Weight bearing as tolerated   Pertinent Vitals/Pain       Mobility  Bed Mobility Bed Mobility: Supine to Sit Supine to Sit: With rails;HOB flat;4: Min assist Details for Bed Mobility Assistance: assist for L LE Transfers Transfers: Sit to Stand;Stand to Sit Sit to Stand: 4: Min assist;With upper extremity assist;From bed Stand to Sit: To chair/3-in-1;4: Min guard;With upper extremity assist Details for Transfer Assistance: assist to steady upon rising, verbal cues for safe technique Ambulation/Gait Ambulation/Gait Assistance: 4: Min guard Ambulation Distance (Feet): 80 Feet Assistive device: Rolling walker Ambulation/Gait Assistance Details: verbal cues for sequence, step length, and safe RW distance, pt reports UE fatigue near end of ambulation Gait Pattern: Step-to pattern;Decreased stance time - left;Antalgic    Exercises     PT Goals Acute Rehab PT Goals PT Goal Formulation: With patient Time For Goal Achievement: 10/03/11 Potential to Achieve Goals: Good Pt will go Supine/Side to Sit: with supervision PT Goal:  Supine/Side to Sit - Progress: Goal set today Pt will go Sit to Supine/Side: with supervision PT Goal: Sit to Supine/Side - Progress: Goal set today Pt will go Sit to Stand: with supervision PT Goal: Sit to Stand - Progress: Goal set today Pt will go Stand to Sit: with supervision PT Goal: Stand to Sit - Progress: Goal set today Pt will Ambulate: >150 feet;with supervision;with least restrictive assistive device PT Goal: Ambulate - Progress: Goal set today Pt will Go Up / Down Stairs: 3-5 stairs;with least restrictive assistive device;with supervision PT Goal: Up/Down Stairs - Progress: Goal set today Pt will Perform Home Exercise Program: with supervision, verbal cues required/provided PT Goal: Perform Home Exercise Program - Progress: Goal set today  Visit Information  Last PT Received On: 09/26/11 Assistance Needed: +1    Subjective Data  Subjective: "I was feeling nauseated this morning but it's better now."   Prior Functioning  Home Living Lives With: Spouse Type of Home: House Home Access: Stairs to enter Secretary/administrator of Steps: 3 Entrance Stairs-Rails: Right Home Layout: One level Home Adaptive Equipment: Bedside commode/3-in-1 Prior Function Level of Independence: Independent Communication Communication: No difficulties    Cognition  Overall Cognitive Status: Appears within functional limits for tasks assessed/performed Arousal/Alertness: Awake/alert Orientation Level: Appears intact for tasks assessed Behavior During Session: [facility] for tasks performed    Extremity/Trunk Assessment Right Upper Extremity Assessment RUE ROM/Strength/Tone: [facility] for tasks assessed Left Upper Extremity Assessment LUE ROM/Strength/Tone: WFL for tasks assessed Right Lower Extremity Assessment RLE ROM/Strength/Tone: [facility] for tasks assessed Left Lower Extremity Assessment LLE ROM/Strength/Tone: Deficits LLE ROM/Strength/Tone Deficits: good quad contraction, ROM TBA   Balance      End  of Session PT - End of Session Equipment Utilized During Treatment: Gait belt;Left knee immobilizer Activity Tolerance: Patient limited by fatigue Patient left: in chair;with call bell/phone within reach;with family/visitor present   Elpidia Karn,KATHrine E 09/26/2011, 12:29 PM Pager: [phone]

## 2011-09-26 NOTE — Progress Notes (Signed)
Physical Therapy Treatment Note   09/26/11 1500  PT Visit Information  Last PT Received On 09/26/11  Assistance Needed +1  PT Time Calculation  PT Start Time 1400  PT Stop Time 1413  PT Time Calculation (min) 13 min  Subjective Data  Subjective "Yes, let's do exercises."  Precautions  Precautions Knee  Required Braces or Orthoses Knee Immobilizer - Left  Knee Immobilizer - Left Discontinue once straight leg raise with < 10 degree lag  Restrictions  LLE Weight Bearing WBAT  Cognition  Overall Cognitive Status Appears within functional limits for tasks assessed/performed  Total Joint Exercises  Ankle Circles/Pumps AROM;Both;20 reps;Supine  Quad Sets Strengthening;Both;20 reps;Supine  Gluteal Sets Both;Other reps (comment);Supine (20 reps)  Short Arc Quad AAROM;Strengthening;Left;Other reps (comment);Supine (20 reps)  Heel Slides AAROM;Left;15 reps;Supine  Hip ABduction/ADduction AROM;Strengthening;Left;15 reps;Supine  Straight Leg Raises AAROM;Strengthening;Both;10 reps;Supine  Knee Flexion AAROM;Left;Limitations (L knee AAROM 0-40*)  PT - End of Session  Activity Tolerance Patient limited by pain  Patient left in bed;with call bell/phone within reach;with family/visitor present  PT - Assessment/Plan  Comments on Treatment Session Pt performed exercises in supine.  Alerted ortho tech to pt ready for CPM.  PT Plan Discharge plan remains appropriate;Frequency remains appropriate  Follow Up Recommendations Home health PT  Equipment Recommended Rolling walker with 5" wheels;Other (comment) (youth RW)  Acute Rehab PT Goals  PT Goal: Perform Home Exercise Program - Progress Progressing toward goal    Zenovia Jarred, PT Pager: [phone]

## 2011-09-27 DIAGNOSIS — D62 Acute posthemorrhagic anemia: Secondary | ICD-10-CM | POA: Diagnosis not present

## 2011-09-27 LAB — BASIC METABOLIC PANEL
CO2: 26 mEq/L (ref 19–32)
Calcium: 8.7 mg/dL (ref 8.4–10.5)
Creatinine, Ser: 0.91 mg/dL (ref 0.50–1.10)

## 2011-09-27 LAB — CBC
MCH: 30.1 pg (ref 26.0–34.0)
MCV: 89.2 fL (ref 78.0–100.0)
Platelets: 288 10*3/uL (ref 150–400)
RBC: 3.06 MIL/uL — ABNORMAL LOW (ref 3.87–5.11)

## 2011-09-27 LAB — GLUCOSE, CAPILLARY
Glucose-Capillary: 154 mg/dL — ABNORMAL HIGH (ref 70–99)
Glucose-Capillary: 157 mg/dL — ABNORMAL HIGH (ref 70–99)
Glucose-Capillary: 151 mg/dL — ABNORMAL HIGH (ref 70–99)

## 2011-09-27 MED ORDER — RIVAROXABAN 10 MG PO TABS: 10.0000 mg | ORAL_TABLET | Freq: Every day | ORAL | Status: DC

## 2011-09-27 MED ORDER — HYDROCODONE-ACETAMINOPHEN 5-325 MG PO TABS: 1.0000 | ORAL_TABLET | ORAL | Status: AC | PRN

## 2011-09-27 MED ORDER — METHOCARBAMOL 500 MG PO TABS: 500.0000 mg | ORAL_TABLET | Freq: Four times a day (QID) | ORAL | Status: AC | PRN

## 2011-09-27 NOTE — Progress Notes (Signed)
Physical Therapy Treatment Note   09/27/11 1500  PT Visit Information  Last PT Received On 09/27/11  Assistance Needed +1  PT Time Calculation  PT Start Time 1351  PT Stop Time 1407  PT Time Calculation (min) 16 min  Subjective Data  Subjective "I don't think I'm ready to go home today."  Precautions  Precautions Knee  Required Braces or Orthoses Knee Immobilizer - Left  Knee Immobilizer - Left Discontinue once straight leg raise with < 10 degree lag  Restrictions  LLE Weight Bearing WBAT  Cognition  Overall Cognitive Status Appears within functional limits for tasks assessed/performed  Bed Mobility  Bed Mobility Supine to Sit;Sit to Supine  Supine to Sit 5: Supervision;HOB flat  Sit to Supine 5: Supervision;HOB flat  Transfers  Transfers Sit to Stand;Stand to Sit  Sit to Stand 4: Min guard;With upper extremity assist;From bed  Stand to Sit 4: Min guard;With upper extremity assist;To bed  Details for Transfer Assistance verbal cues for hand placement and L LE forward  Ambulation/Gait  Ambulation/Gait Assistance 4: Min guard  Ambulation Distance (Feet) 60 Feet  Assistive device Rolling walker  Ambulation/Gait Assistance Details pt continues to have poor knee extension during ambulation reporting "stiff at the back of the knee" and encouraged heel placement during gait  Gait Pattern Step-to pattern;Decreased stance time - left;Left flexed knee in stance  Total Joint Exercises  Ankle Circles/Pumps AROM;Both;20 reps;Supine  Quad Sets Strengthening;Both;20 reps;Supine  Short Arc Quad Strengthening;Left;Other reps (comment);Supine;AROM (20 reps)  Heel Slides AAROM;Left;20 reps;Seated  Hip ABduction/ADduction AROM;Strengthening;Left;15 reps;Supine  Straight Leg Raises AAROM;Strengthening;Both;10 reps;Supine  Knee Flexion AAROM;Left;Limitations;Seated (L knee AAROM -2-65*)  PT - End of Session  Activity Tolerance Patient limited by fatigue  Patient left in bed;with call  bell/phone within reach;with family/visitor present  PT - Assessment/Plan  Comments on Treatment Session Pt reports she does not feel comfortable with d/c today so performed exercises and ambulation.  Also educated pt to have heel propped for 5 minutes to assist with increasing knee extension as tolerated.  Pt agreeable to perform stairs in the morning prior to d/c.  PT Plan Discharge plan remains appropriate;Frequency remains appropriate  Follow Up Recommendations Home health PT  Equipment Recommended Rolling walker with 5" wheels;Other (comment) (youth RW)  Acute Rehab PT Goals  PT Goal: Sit to Stand - Progress Progressing toward goal  PT Goal: Stand to Sit - Progress Progressing toward goal  PT Goal: Ambulate - Progress Progressing toward goal  PT Goal: Perform Home Exercise Program - Progress Progressing toward goal    Zenovia Jarred, PT Pager: [phone]

## 2011-09-27 NOTE — Discharge Instructions (Signed)
Knee Rehabilitation, Guidelines Following Surgery Results after knee surgery are often greatly improved when you follow the exercise, range of motion and muscle strengthening exercises prescribed by your doctor. Safety measures are also important to protect the knee from further injury. Any time any of these exercises cause you to have increased pain or swelling in your knee joint, decrease the amount until you are comfortable again and slowly increase them. If you have problems or questions, call your caregiver or physical therapist for advice. HOME CARE INSTRUCTIONS   Remove items at home which could result in a fall. This includes throw rugs or furniture in walking pathways.   Continue medications as instructed.   You may shower or take tub baths when your staples or stitches are removed or as instructed.   Walk using crutches or walker as instructed.   Put weight on your legs and walk as much as is comfortable.   You may resume a sexual relationship in one month or when given the OK by your doctor.   Return to work as instructed by your doctor.   Do not drive a car for 6 weeks or as instructed.   Wear elastic stockings until instructed not to.   Make sure you keep all of your appointments after your operation with all of your doctors and caregivers.  RANGE OF MOTION AND STRENGTHENING EXERCISES Rehabilitation of the knee is important following a knee injury or an operation. After just a few days of immobilization, the muscles of the thigh which control the knee become weakened and shrink (atrophy). Knee exercises are designed to build up the tone and strength of the thigh muscles and to improve knee motion. Often times heat used for twenty to thirty minutes before working out will loosen up your tissues and help with improving the range of motion. These exercises can be done on a training (exercise) mat, on the floor, on a table or on a bed. Use what ever works the best and is most  comfortable for you Knee exercises include:  Leg Lifts - While your knee is still immobilized in a splint or cast, you can do straight leg raises. Lift the leg to 60 degrees, hold for 3 sec, and slowly lower the leg. Repeat 10-20 times 2-3 times daily. Perform this exercise against resistance later as your knee gets better.   Quad and Hamstring Sets - Tighten up the muscle on the front of the thigh (Quad) and hold for 5-10 sec. Repeat this 10-20 times hourly. Hamstring sets are done by pushing the foot backward against an object and holding for 5-10 sec. Repeat as with quad sets.  A rehabilitation program following serious knee injuries can speed recovery and prevent re-injury in the future due to weakened muscles. Contact your doctor or a physical therapist for more information on knee rehabilitation. MAKE SURE YOU:   Understand these instructions.   Will watch your condition.   Will get help right away if you are not doing well or get worse.  Document Released: 05/15/2005 Document Revised: 05/04/2011 Document Reviewed: 11/02/2006 [facility] Patient Information 2012 [place], [place].  Pick up stool softner and laxative for home. Do not submerge incision under water. May shower. Continue to use ice for pain and swelling from surgery.  Take Xarelto for two and a half more weeks, then discontinue Xarelto.  After completing the Xarelto, the patient may resume their 325 mg Aspirin daily.

## 2011-09-27 NOTE — Progress Notes (Signed)
Physical Therapy Treatment Patient Details Name: Angela Galvan MRN: [id number] DOB: 01/24/1933 Today's Date: 09/27/2011 Time: 4098-1191 PT Time Calculation (min): 11 min  PT Assessment / Plan / Recommendation Comments on Treatment Session  Pt reports increased L knee pain and inability to straighten knee with gait this morning.  Attempted stairs however pt unable to complete.  Will re-attempt if pt feeling better this afternoon.    Follow Up Recommendations  Home health PT    Equipment Recommendations  Rolling walker with 5" wheels;Other (comment) (youth RW)    Frequency     Plan Discharge plan remains appropriate;Frequency remains appropriate    Precautions / Restrictions Precautions Precautions: Knee Required Braces or Orthoses: Knee Immobilizer - Left Knee Immobilizer - Left: Discontinue once straight leg raise with < 10 degree lag Restrictions Weight Bearing Restrictions: No LLE Weight Bearing: Weight bearing as tolerated   Pertinent Vitals/Pain     Mobility  Bed Mobility Bed Mobility: Supine to Sit;Sit to Supine Supine to Sit: 5: Supervision;HOB flat Sit to Supine: 5: Supervision;HOB flat Details for Bed Mobility Assistance: verbal cues for no trapeze or rails (none at home) Transfers Transfers: Sit to Stand;Stand to Sit Sit to Stand: 4: Min guard;With upper extremity assist;From bed Stand to Sit: 4: Min guard;With upper extremity assist;To bed Details for Transfer Assistance: verbal cues for hand placement and L LE forward Ambulation/Gait Ambulation/Gait Assistance: 4: Min guard Ambulation Distance (Feet): 20 Feet (10x2 to stairs and back to bed) Assistive device: Rolling walker Ambulation/Gait Assistance Details: pt reporting decreased ability to straighten L knee and place heel on floor  so sat EOB and performed active assist knee extension x 10 prior to gait but pt still demonstrating decreased ability Gait Pattern: Step-to pattern;Decreased stance time -  left;Left flexed knee in stance Stairs: Yes Stairs Assistance: 4: Min assist Stairs Assistance Details (indicate cue type and reason): verbal cues for keeping hands on RW and sequence, pt reports overall not feeling well and with increased L knee pain so only performed 1 step then assisted pt back to bed Stair Management Technique: Step to pattern;With walker;Backwards Number of Stairs: 1     Exercises     PT Goals Acute Rehab PT Goals PT Goal: Supine/Side to Sit - Progress: Met PT Goal: Sit to Supine/Side - Progress: Met PT Goal: Sit to Stand - Progress: Progressing toward goal PT Goal: Stand to Sit - Progress: Progressing toward goal PT Goal: Ambulate - Progress: Progressing toward goal PT Goal: Up/Down Stairs - Progress: Progressing toward goal  Visit Information  Last PT Received On: 09/27/11 Assistance Needed: +1    Subjective Data  Subjective: "The back of my leg is so tight."   Cognition  Overall Cognitive Status: Appears within functional limits for tasks assessed/performed    Balance     End of Session PT - End of Session Equipment Utilized During Treatment: Gait belt Activity Tolerance: Patient limited by fatigue;Patient limited by pain Patient left: in bed;with call bell/phone within reach;with family/visitor present    Dalonte Hardage,KATHrine E 09/27/2011, 11:40 AM Pager: [phone]

## 2011-09-27 NOTE — Progress Notes (Signed)
Subjective: 2 Days Post-Op Procedure(s) (LRB): TOTAL KNEE ARTHROPLASTY (Left) Patient reports pain as mild.   Patient seen in rounds with Dr. Lequita Halt. Patient is well, but has had some minor complaints of pain in the knee, requiring pain medications  Objective: Vital signs in last 24 hours: Temp:  [98.3 F (36.8 C)-98.8 F (37.1 C)] 98.3 F (36.8 C) (05/01 0518) Pulse Rate:  [82-95] 85  (05/01 0518) Resp:  [16-20] 20  (05/01 0518) BP: (116-127)/(65-66) 116/66 mmHg (05/01 0518) SpO2:  [95 %-97 %] 95 % (05/01 0518)  Intake/Output from previous day:  Intake/Output Summary (Last 24 hours) at 09/27/11 1042 Last data filed at 09/27/11 0918  Gross per 24 hour  Intake 1582.33 ml  Output   2052 ml  Net -469.67 ml    Intake/Output this shift: Total I/O In: 240 [P.O.:240] Out: 602 [Urine:600; Stool:2]  Labs:  [facility] 09/27/11 0445 09/26/11 0425  HGB 9.2* 10.0*    Basename 09/27/11 0445 09/26/11 0425  WBC 13.9* 14.0*  RBC 3.06* 3.28*  HCT 27.3* 29.2*  PLT 288 321    Basename 09/27/11 0445 09/26/11 0425  NA 135 136  K 4.2 4.5  CL 99 103  CO2 26 26  BUN 17 15  CREATININE 0.91 0.89  GLUCOSE 131* 135*  CALCIUM 8.7 8.5   No results found for this basename: LABPT:2,INR:2 in the last 72 hours  EXAM: General - Patient is Alert, Appropriate and Oriented Extremity - Neurovascular intact Sensation intact distally Incision - clean, dry, no drainage, healing Motor Function - intact, moving foot and toes well on exam.   Assessment/Plan: 2 Days Post-Op Procedure(s) (LRB): TOTAL KNEE ARTHROPLASTY (Left) Procedure(s) (LRB): TOTAL KNEE ARTHROPLASTY (Left) Past Medical History  Diagnosis Date  . Diverticulitis     s/p colectomy in 1991  . IBS (irritable bowel syndrome)   . HTN (hypertension)   . Hyperlipidemia   . S/P colonoscopy 2003    minimal internal hemorrhoids, pancolonic diverticula, biopsy of rectum: lymphoplasmacytic microscopic colitis, colon biopsies normal    . S/P colonoscopy 2012    pancolonic diverticula, nl TI, external hemorrhoidal tag,/rectum bx no abnormalities  . S/P endoscopy 2012    normal esophagus, stenotic pyloric channel, TTS dilation  . Diarrhea   . GERD (gastroesophageal reflux disease)   . Arthritis   . Hypothyroidism   . Diabetes mellitus     no meds.-diet controlled  . Stroke 08/10/2006    no residual   Principal Problem:  *OA (osteoarthritis) of knee Active Problems:  Postop Acute blood loss anemia   Advance diet Up with therapy Discharge home with home health later today if meets all goals. Diet - Cardiac diet and Diabetic diet Follow up - in 2 weeks Activity - WBAT Disposition - Home Condition Upon Discharge - Good at round on day 2. If meets goals, then home later today. D/C Meds - See DC Summary DVT Prophylaxis - Xarelto  Avilyn Virtue 09/27/2011, 10:42 AM

## 2011-09-27 NOTE — Progress Notes (Signed)
Occupational Therapy Note Chart reviewed. Spoke with pt and she doesn't feel she needs OT. She states she has a 3in1 and husband can assist PRN at discharge. She has a walk in shower and declines the need to practice any of these ADLs. Will sign off per pt request. Judithann Sauger OTR/L [phone] 09/27/2011

## 2011-09-27 NOTE — Progress Notes (Signed)
CARE MANAGEMENT NOTE 09/27/2011  Patient:  Angela Galvan, Angela Galvan   Account Number:  [id number]  Date Initiated:  09/27/2011  Documentation initiated by:  Colleen Can  Subjective/Objective Assessment:   DX OSTEOARTHRITIS LEFT KNEE; TOTAL KNEE REPLACEMNT     Action/Plan:   CM  SPOKE WITH PATIENT AND SPOUSE.  PLANS ARE FOR PATIENT TO RETURN TO HER HOME IN [place] WHERE SPOUSE WILL BE CAREGIVER.   Anticipated DC Date:  09/27/2011   Anticipated DC Plan:  HOME W HOME HEALTH SERVICES  In-house referral  Clinical Social Worker      DC Planning Services  CM consult      [facility]  HOME HEALTH   Choice offered to / List presented to:  C-1 Patient   DME arranged  NA      DME agency  NA     HH arranged  HH-2 PT      [facility] agency  [facility]   Status of service:  Completed, signed off Medicare Important Message given?  NA - LOS <3 / Initial given by admissions (If response is "NO", the following Medicare IM given date fields will be blank) Comments:  09/27/2011 Raynelle Bring BSN CCM [phone] Pt has chosen Armed forces logistics/support/administrative officer for [facility] services. States she already has DME from prev surgery. List of HH agencies placed on shadow chart

## 2011-09-27 NOTE — Progress Notes (Signed)
D: 78 yo WF patient of Dr. Lequita Halt under Orthopaedic Services now POD#2 s/p Lt TKA due to OA.  Pt also with h/o Rt TKA in 2004 due to OA, cataract removal x2, diverticulosis, DDD, lumbago, IBS, HTN, hypercholesterolemia, stroke March 2008 with very mild bilateral LE weakness, hypothyroidism, hiatal hernia, GERD, insomnia and diet controlled DM. Ace wrap dressing to left knee c/d/i with onqpump present and infusing.  Teds/scds to bilateral LE. Foely to gravity.  Pt progressing well from orthopaedic standpoint however pt with c/o severe nausea with some emesis since 0630 on 09/26/11. Pt states during initial assessment at 1955 that she has not taken any pain medication during dayshift due to fear of further boughts of vomiting.  A: Discussed symptoms with pt.  Pt states that nausea has gradual improved throughout the day but has not 100% resolved.  MAR shows that pt has received several doses of both Zofran and Reglan po/iv over the past 12 hrs. Rn suspects that oxycodone po is source of c/o N/V.  Reviewed options with pt re: adjusting medications to improve pain control and better manage N/V.  Paged on call PA and requested orders to D/C oxycodone and start Norco 5/325 1-2 tabs po q4hrs prn pain, and also start Phenergan 12.5-25mg  po q6hrs prn n/v.  Patient administered 1 norco tab po at and 12.5mg  phenergan po 2025 for pain level of 6/10 to left posterior knee and c/o nausea.  Pt also given 2 tabs po norco at 0132 for c/o 6/10 pain. R: at 2124 pt indicated pain level has been reduced to 2/10 which is below her target of 3/10.  Pt also states that she has had good relief from nausea.  Pt with minimal c/o pain x4hrs and no c/o nausea.  approx 1 hr after second dose of pain medication pt rates pain again below target at 2/10 at 0226.  Pt states that she is extremely pleased with changes in prn medications.  Will cont to monitor pt status.  Mamie Laurel, RN.

## 2011-09-28 LAB — CBC
HCT: 26.8 % — ABNORMAL LOW (ref 36.0–46.0)
Hemoglobin: 9.2 g/dL — ABNORMAL LOW (ref 12.0–15.0)
MCH: 30.3 pg (ref 26.0–34.0)
MCHC: 34.3 g/dL (ref 30.0–36.0)
MCV: 88.2 fL (ref 78.0–100.0)
Platelets: 315 10*3/uL (ref 150–400)
RBC: 3.04 MIL/uL — ABNORMAL LOW (ref 3.87–5.11)
RDW: 12.8 % (ref 11.5–15.5)
WBC: 14.1 10*3/uL — ABNORMAL HIGH (ref 4.0–10.5)

## 2011-09-28 LAB — GLUCOSE, CAPILLARY: Glucose-Capillary: 143 mg/dL — ABNORMAL HIGH (ref 70–99)

## 2011-09-28 NOTE — Progress Notes (Signed)
Physical Therapy Treatment Patient Details Name: Angela Galvan MRN: [id number] DOB: August 22, 1932 Today's Date: 09/28/2011 Time: 1191-4782 PT Time Calculation (min): 13 min  PT Assessment / Plan / Recommendation Comments on Treatment Session  Pt ambulated and performed stairs this morning in preparation for d/c home today.  Pt given handout for stairs as well.  No questions/concerns about d/c home.    Follow Up Recommendations  Home health PT    Equipment Recommendations  Rolling walker with 5" wheels;Other (comment) (youth RW)    Frequency     Plan Discharge plan remains appropriate;Frequency remains appropriate    Precautions / Restrictions Precautions Precautions: Knee Required Braces or Orthoses: Knee Immobilizer - Left Knee Immobilizer - Left: Discontinue once straight leg raise with < 10 degree lag Restrictions LLE Weight Bearing: Weight bearing as tolerated   Pertinent Vitals/Pain     Mobility  Bed Mobility Bed Mobility: Supine to Sit Supine to Sit: 5: Supervision;HOB flat Transfers Transfers: Sit to Stand;Stand to Sit Sit to Stand: 5: Supervision;With upper extremity assist;From bed;From chair/3-in-1 Stand to Sit: 5: Supervision;With upper extremity assist;To chair/3-in-1 Details for Transfer Assistance: verbal cues for hand placement and L LE forward Ambulation/Gait Ambulation/Gait Assistance: 4: Min guard Ambulation Distance (Feet): 40 Feet Assistive device: Rolling walker Ambulation/Gait Assistance Details: limited distance due to pain and performing stairs, continues to keep slightly lifted from floor with ambulation due to increased knee flexion Gait Pattern: Step-to pattern;Decreased stance time - left;Left flexed knee in stance Stairs: Yes Stairs Assistance: 4: Min assist Stairs Assistance Details (indicate cue type and reason): verbal cues for safe technique including sequence and RW placement, pt given handout Stair Management Technique: Step to pattern;With  walker;Backwards Number of Stairs: 3  (performed x2 with seated rest break inbetween)    Exercises     PT Goals Acute Rehab PT Goals PT Goal: Sit to Stand - Progress: Met PT Goal: Stand to Sit - Progress: Met PT Goal: Ambulate - Progress: Progressing toward goal PT Goal: Up/Down Stairs - Progress: Progressing toward goal  Visit Information  Last PT Received On: 09/28/11 Assistance Needed: +1    Subjective Data  Subjective: "I'm hurting but I'm ready to walk."   Cognition  Overall Cognitive Status: Appears within functional limits for tasks assessed/performed    Balance     End of Session PT - End of Session Equipment Utilized During Treatment: Gait belt Activity Tolerance: Patient limited by fatigue;Patient limited by pain Patient left: in chair;with call bell/phone within reach;with nursing in room    Sheldon Amara,KATHrine E 09/28/2011, 11:10 AM Pager: [phone]

## 2011-09-28 NOTE — Progress Notes (Signed)
D/c instructions given and explained to pt.  Prescriptions for robaxin, xarelto, and norco given to pt, placed in pt's hands.  Iv site removed, pressure dressing placed on site.  Pt stable and ready for d/c.  Pt d/c home now with husband.

## 2011-09-28 NOTE — Progress Notes (Signed)
Subjective: 3 Days Post-Op Procedure(s) (LRB): TOTAL KNEE ARTHROPLASTY (Left) Patient reports pain as mild.   Patient is well, and has had no acute complaints or problems Ready to go home  Objective: Vital signs in last 24 hours: Temp:  [98.3 F (36.8 C)-99.1 F (37.3 C)] 98.3 F (36.8 C) (05/02 0605) Pulse Rate:  [82-98] 82  (05/02 0605) Resp:  [16] 16  (05/02 0605) BP: (118-133)/(63-70) 130/65 mmHg (05/02 0605) SpO2:  [97 %-98 %] 98 % (05/02 0605)  Intake/Output from previous day:  Intake/Output Summary (Last 24 hours) at 09/28/11 0757 Last data filed at 09/28/11 0756  Gross per 24 hour  Intake   1295 ml  Output   1004 ml  Net    291 ml    Intake/Output this shift: Total I/O In: 360 [P.O.:360] Out: -   Labs:  Basename 09/28/11 0425 09/27/11 0445 09/26/11 0425  HGB 9.2* 9.2* 10.0*    Basename 09/28/11 0425 09/27/11 0445  WBC 14.1* 13.9*  RBC 3.04* 3.06*  HCT 26.8* 27.3*  PLT 315 288    Basename 09/27/11 0445 09/26/11 0425  NA 135 136  K 4.2 4.5  CL 99 103  CO2 26 26  BUN 17 15  CREATININE 0.91 0.89  GLUCOSE 131* 135*  CALCIUM 8.7 8.5   No results found for this basename: LABPT:2,INR:2 in the last 72 hours  EXAM: General - Patient is Alert, Appropriate and Oriented Extremity - Neurovascular intact Sensation intact distally Incision - clean, dry, no drainage, healing Motor Function - intact, moving foot and toes well on exam.   Assessment/Plan: 3 Days Post-Op Procedure(s) (LRB): TOTAL KNEE ARTHROPLASTY (Left) Procedure(s) (LRB): TOTAL KNEE ARTHROPLASTY (Left) Past Medical History  Diagnosis Date  . Diverticulitis     s/p colectomy in 1991  . IBS (irritable bowel syndrome)   . HTN (hypertension)   . Hyperlipidemia   . S/P colonoscopy 2003    minimal internal hemorrhoids, pancolonic diverticula, biopsy of rectum: lymphoplasmacytic microscopic colitis, colon biopsies normal  . S/P colonoscopy 2012    pancolonic diverticula, nl TI, external  hemorrhoidal tag,/rectum bx no abnormalities  . S/P endoscopy 2012    normal esophagus, stenotic pyloric channel, TTS dilation  . Diarrhea   . GERD (gastroesophageal reflux disease)   . Arthritis   . Hypothyroidism   . Diabetes mellitus     no meds.-diet controlled  . Stroke 08/10/2006    no residual   Principal Problem:  *OA (osteoarthritis) of knee Active Problems:  Postop Acute blood loss anemia   Up with therapy Discharge home with home health Diet - Cardiac diet and Diabetic diet Follow up - in 2 weeks Activity - WBAT Disposition - Home Condition Upon Discharge - Good D/C Meds - See DC Summary DVT Prophylaxis - Xarelto  Loyda Costin 09/28/2011, 7:57 AM

## 2011-09-28 NOTE — Progress Notes (Signed)
CARE MANAGEMENT NOTE 09/28/2011  Patient:  Angela Galvan, Angela Galvan   Account Number:  [id number]  Date Initiated:  09/27/2011  Documentation initiated by:  Colleen Can  Subjective/Objective Assessment:   DX OSTEOARTHRITIS LEFT KNEE; TOTAL KNEE REPLACEMNT     Action/Plan:   CM  SPOKE WITH PATIENT AND SPOUSE.  PLANS ARE FOR PATIENT TO RETURN TO HER HOME IN [place] WHERE SPOUSE WILL BE CAREGIVER.   Anticipated DC Date:  09/27/2011   Anticipated DC Plan:  HOME W HOME HEALTH SERVICES  In-house referral  Clinical Social Worker      DC Planning Services  CM consult      [facility] Choice  HOME HEALTH   Choice offered to / List presented to:  C-1 Patient   DME arranged  NA      DME agency  NA     HH arranged  HH-2 PT      [facility] agency  [facility]   Status of service:  Completed, signed off Medicare Important Message given?  NA - LOS <3 / Initial given by admissions (If response is "NO", the following Medicare IM given date fields will be blank) Date Medicare IM given:   Date Additional Medicare IM given:    Discharge Disposition:  HOME W HOME HEALTH SERVICES  ed:    Comments:  09/28/2011 Raynelle Bring BSN CCM [phone] pt discharged 09/28/2011. Genevieve Norlander [facility] services in place with start date 09/29/2011

## 2011-09-29 DIAGNOSIS — Z96659 Presence of unspecified artificial knee joint: Secondary | ICD-10-CM | POA: Diagnosis not present

## 2011-09-29 DIAGNOSIS — M5137 Other intervertebral disc degeneration, lumbosacral region: Secondary | ICD-10-CM | POA: Diagnosis not present

## 2011-09-29 DIAGNOSIS — M25569 Pain in unspecified knee: Secondary | ICD-10-CM | POA: Diagnosis not present

## 2011-09-29 DIAGNOSIS — F411 Generalized anxiety disorder: Secondary | ICD-10-CM | POA: Diagnosis not present

## 2011-09-29 DIAGNOSIS — Z471 Aftercare following joint replacement surgery: Secondary | ICD-10-CM | POA: Diagnosis not present

## 2011-09-29 DIAGNOSIS — I10 Essential (primary) hypertension: Secondary | ICD-10-CM | POA: Diagnosis not present

## 2011-10-02 DIAGNOSIS — Z96659 Presence of unspecified artificial knee joint: Secondary | ICD-10-CM | POA: Diagnosis not present

## 2011-10-02 DIAGNOSIS — F411 Generalized anxiety disorder: Secondary | ICD-10-CM | POA: Diagnosis not present

## 2011-10-02 DIAGNOSIS — I10 Essential (primary) hypertension: Secondary | ICD-10-CM | POA: Diagnosis not present

## 2011-10-02 DIAGNOSIS — M5137 Other intervertebral disc degeneration, lumbosacral region: Secondary | ICD-10-CM | POA: Diagnosis not present

## 2011-10-02 DIAGNOSIS — Z471 Aftercare following joint replacement surgery: Secondary | ICD-10-CM | POA: Diagnosis not present

## 2011-10-03 DIAGNOSIS — Z96659 Presence of unspecified artificial knee joint: Secondary | ICD-10-CM | POA: Diagnosis not present

## 2011-10-03 DIAGNOSIS — Z471 Aftercare following joint replacement surgery: Secondary | ICD-10-CM | POA: Diagnosis not present

## 2011-10-03 DIAGNOSIS — I10 Essential (primary) hypertension: Secondary | ICD-10-CM | POA: Diagnosis not present

## 2011-10-03 DIAGNOSIS — F411 Generalized anxiety disorder: Secondary | ICD-10-CM | POA: Diagnosis not present

## 2011-10-03 DIAGNOSIS — M5137 Other intervertebral disc degeneration, lumbosacral region: Secondary | ICD-10-CM | POA: Diagnosis not present

## 2011-10-04 DIAGNOSIS — I10 Essential (primary) hypertension: Secondary | ICD-10-CM | POA: Diagnosis not present

## 2011-10-04 DIAGNOSIS — F411 Generalized anxiety disorder: Secondary | ICD-10-CM | POA: Diagnosis not present

## 2011-10-04 DIAGNOSIS — Z471 Aftercare following joint replacement surgery: Secondary | ICD-10-CM | POA: Diagnosis not present

## 2011-10-04 DIAGNOSIS — Z96659 Presence of unspecified artificial knee joint: Secondary | ICD-10-CM | POA: Diagnosis not present

## 2011-10-04 DIAGNOSIS — M5137 Other intervertebral disc degeneration, lumbosacral region: Secondary | ICD-10-CM | POA: Diagnosis not present

## 2011-10-05 DIAGNOSIS — Z471 Aftercare following joint replacement surgery: Secondary | ICD-10-CM | POA: Diagnosis not present

## 2011-10-05 DIAGNOSIS — F411 Generalized anxiety disorder: Secondary | ICD-10-CM | POA: Diagnosis not present

## 2011-10-05 DIAGNOSIS — I10 Essential (primary) hypertension: Secondary | ICD-10-CM | POA: Diagnosis not present

## 2011-10-05 DIAGNOSIS — M5137 Other intervertebral disc degeneration, lumbosacral region: Secondary | ICD-10-CM | POA: Diagnosis not present

## 2011-10-05 DIAGNOSIS — Z96659 Presence of unspecified artificial knee joint: Secondary | ICD-10-CM | POA: Diagnosis not present

## 2011-10-06 DIAGNOSIS — Z471 Aftercare following joint replacement surgery: Secondary | ICD-10-CM | POA: Diagnosis not present

## 2011-10-06 DIAGNOSIS — M5137 Other intervertebral disc degeneration, lumbosacral region: Secondary | ICD-10-CM | POA: Diagnosis not present

## 2011-10-06 DIAGNOSIS — F411 Generalized anxiety disorder: Secondary | ICD-10-CM | POA: Diagnosis not present

## 2011-10-06 DIAGNOSIS — Z96659 Presence of unspecified artificial knee joint: Secondary | ICD-10-CM | POA: Diagnosis not present

## 2011-10-06 DIAGNOSIS — I10 Essential (primary) hypertension: Secondary | ICD-10-CM | POA: Diagnosis not present

## 2011-10-09 DIAGNOSIS — M5137 Other intervertebral disc degeneration, lumbosacral region: Secondary | ICD-10-CM | POA: Diagnosis not present

## 2011-10-09 DIAGNOSIS — F411 Generalized anxiety disorder: Secondary | ICD-10-CM | POA: Diagnosis not present

## 2011-10-09 DIAGNOSIS — Z471 Aftercare following joint replacement surgery: Secondary | ICD-10-CM | POA: Diagnosis not present

## 2011-10-09 DIAGNOSIS — Z96659 Presence of unspecified artificial knee joint: Secondary | ICD-10-CM | POA: Diagnosis not present

## 2011-10-09 DIAGNOSIS — I10 Essential (primary) hypertension: Secondary | ICD-10-CM | POA: Diagnosis not present

## 2011-10-11 NOTE — Discharge Summary (Signed)
Physician Discharge Summary   Patient ID: Angela Galvan MRN: [id number] DOB/AGE: 1932-08-08 78 y.o.  Admit date: 09/25/2011 Discharge date: 09/28/2011  Primary Diagnosis: Osteoarthritis Left Knee  Admission Diagnoses:  Past Medical History  Diagnosis Date  . Diverticulitis     s/p colectomy in 1991  . IBS (irritable bowel syndrome)   . HTN (hypertension)   . Hyperlipidemia   . S/P colonoscopy 2003    minimal internal hemorrhoids, pancolonic diverticula, biopsy of rectum: lymphoplasmacytic microscopic colitis, colon biopsies normal  . S/P colonoscopy 2012    pancolonic diverticula, nl TI, external hemorrhoidal tag,/rectum bx no abnormalities  . S/P endoscopy 2012    normal esophagus, stenotic pyloric channel, TTS dilation  . Diarrhea   . GERD (gastroesophageal reflux disease)   . Arthritis   . Hypothyroidism   . Diabetes mellitus     no meds.-diet controlled  . Stroke 08/10/2006    no residual   Discharge Diagnoses:   Principal Problem:  *OA (osteoarthritis) of knee Active Problems:  Postop Acute blood loss anemia  Procedure:  Procedure(s) (LRB): TOTAL KNEE ARTHROPLASTY (Left)   Consults: None  HPI: Angela Galvan is a 78 y.o. year old female with end stage OA of her left knee with progressively worsening pain and dysfunction. She has constant pain, with activity and at rest and significant functional deficits with difficulties even with ADLs. She has had extensive non-op management including analgesics, injections of cortisone and viscosupplements, and home exercise program, but remains in significant pain with significant dysfunction. Radiographs show bone on bone arthritis medial and patellofemoral with tibial subluxation. She presents now for left Total Knee Arthroplasty.   Laboratory Data: Hospital Outpatient Visit on 09/18/2011  Component Date Value Range Status  . MRSA, PCR  09/18/2011 NEGATIVE  NEGATIVE Final  . Staphylococcus aureus  09/18/2011 NEGATIVE   NEGATIVE Final   Comment:                                 The Xpert SA Assay (FDA                          approved for NASAL specimens                          only), is one component of                          a comprehensive surveillance                          program.  It is not intended                          to diagnose infection nor to                          guide or monitor treatment.  . WBC (K/uL) 09/18/2011 9.1  4.0-10.5 Final  . RBC (MIL/uL) 09/18/2011 4.28  3.87-5.11 Final  . Hemoglobin (g/dL) 46/96/2952 84.1  32.4-40.1 Final  . HCT (%) 09/18/2011 38.5  36.0-46.0 Final  . MCV (fL) 09/18/2011 90.0  78.0-100.0 Final  . MCH (pg) 09/18/2011 29.7  26.0-34.0 Final  . MCHC (g/dL) 02/72/5366 44.0  34.7-42.5 Final  .  RDW (%) 09/18/2011 12.7  11.5-15.5 Final  . Platelets (K/uL) 09/18/2011 417* 150-400 Final  . Sodium (mEq/L) 09/18/2011 136  135-145 Final  . Potassium (mEq/L) 09/18/2011 4.2  3.5-5.1 Final  . Chloride (mEq/L) 09/18/2011 101  96-112 Final  . CO2 (mEq/L) 09/18/2011 26  19-32 Final  . Glucose, Bld (mg/dL) 08/65/7846 962* 95-28 Final  . BUN (mg/dL) 41/32/4401 16  0-27 Final  . Creatinine, Ser (mg/dL) 25/36/6440 3.47  4.25-9.56 Final  . Calcium (mg/dL) 38/75/6433 9.1  2.9-51.8 Final  . GFR calc non Af Amer (mL/min) 09/18/2011 48* >90 Final  . GFR calc Af Amer (mL/min) 09/18/2011 55* >90 Final   Comment:                                 The eGFR has been calculated                          using the CKD EPI equation.                          This calculation has not been                          validated in all clinical                          situations.                          eGFR's persistently                          <90 mL/min signify                          possible Chronic Kidney Disease.  Marland Kitchen aPTT (seconds) 09/18/2011 28  24-37 Final  . Prothrombin Time (seconds) 09/18/2011 12.7  11.6-15.2 Final  . INR  09/18/2011 0.93  0.00-1.49 Final  . Color,  Urine  09/18/2011 YELLOW  YELLOW Final  . APPearance  09/18/2011 CLEAR  CLEAR Final  . Specific Gravity, Urine  09/18/2011 1.015  1.005-1.030 Final  . pH  09/18/2011 6.0  5.0-8.0 Final  . Glucose, UA (mg/dL) 84/16/6063 NEGATIVE  NEGATIVE Final  . Hgb urine dipstick  09/18/2011 NEGATIVE  NEGATIVE Final  . Bilirubin Urine  09/18/2011 NEGATIVE  NEGATIVE Final  . Ketones, ur (mg/dL) 01/60/1093 NEGATIVE  NEGATIVE Final  . Protein, ur (mg/dL) 23/55/7322 NEGATIVE  NEGATIVE Final  . Urobilinogen, UA (mg/dL) 02/54/2706 0.2  2.3-7.6 Final  . Nitrite  09/18/2011 NEGATIVE  NEGATIVE Final  . Leukocytes, UA  09/18/2011 NEGATIVE  NEGATIVE Final   MICROSCOPIC NOT DONE ON URINES WITH NEGATIVE PROTEIN, BLOOD, LEUKOCYTES, NITRITE, OR GLUCOSE <1000 mg/dL.   No results found for this basename: HGB:5 in the last 72 hours No results found for this basename: WBC:2,RBC:2,HCT:2,PLT:2 in the last 72 hours No results found for this basename: NA:2,K:2,CL:2,CO2:2,BUN:2,CREATININE:2,GLUCOSE:2,CALCIUM:2 in the last 72 hours No results found for this basename: LABPT:2,INR:2 in the last 72 hours  X-Rays:Dg Chest 2 View  09/22/2011  *RADIOLOGY REPORT*  Clinical Data: Left knee osteoarthritis.  Preop for knee arthroplasty.  Diabetes.  CHEST - 2 VIEW  Comparison:  10/20/2008  Findings:  The heart size and mediastinal contours are within normal limits.  Both lungs are clear.  The visualized skeletal structures are unremarkable.  IMPRESSION: No active cardiopulmonary disease.  Original Report Authenticated By: Danae Orleans, M.D.    EKG:No orders found for this or any previous visit.   Hospital Course: Patient was admitted to [facility] and taken to the OR and underwent the above state procedure without complications.  Patient tolerated the procedure well and was later transferred to the recovery room and then to the orthopaedic floor for postoperative care.  They were given PO and IV analgesics for pain control  following their surgery.  They were given 24 hours of postoperative antibiotics and started on DVT prophylaxis in the form of Xarelto.   PT and OT were ordered for total joint protocol.  Discharge planning consulted to help with postop disposition and equipment needs.  Patient had a rough night on the evening of surgery and the next day due to nausea and vomiting but started to get up OOB with therapy on day one.  PCA was discontinued and they were weaned over to PO meds.  Hemovac drain was pulled without difficulty.  Continued to work with therapy into day two. They were feeling better the next day.  Dressing was changed on day two and the incision was healing well.  By day three, the patient had progressed with therapy and meeting their goals.  Incision was healing well.  Patient was seen in rounds and was ready to go home.  Discharge Medications: Prior to Admission medications   Medication Sig Start Date End Date Taking? Authorizing Provider  acetaminophen (TYLENOL) 500 MG tablet Take 500-1,000 mg by mouth every 6 (six) hours as needed. Pain    Yes Historical Provider, MD  ALPRAZolam (XANAX) 0.25 MG tablet Take 0.25 mg by mouth 2 (two) times daily as needed. Anxiety    Yes Historical Provider, MD  atorvastatin (LIPITOR) 10 MG tablet Take 10 mg by mouth daily after breakfast.   Yes Historical Provider, MD  cholestyramine (QUESTRAN) 4 GM/DOSE powder Take 1 to 2 grams twice daily as directed. 09/19/11  Yes Tiffany Kocher, PA  citalopram (CELEXA) 20 MG tablet Take 20 mg by mouth daily after breakfast.   Yes Historical Provider, MD  esomeprazole (NEXIUM) 40 MG capsule Take 40 mg by mouth daily before breakfast.   Yes Historical Provider, MD  levothyroxine (SYNTHROID, LEVOTHROID) 50 MCG tablet Take 50 mcg by mouth daily before breakfast.    Yes Historical Provider, MD  losartan (COZAAR) 100 MG tablet Take 50 mg by mouth daily after breakfast.    Yes Historical Provider, MD  RESTASIS 0.05 % ophthalmic  emulsion Place 1 drop into both eyes every 12 (twelve) hours.  01/11/11  Yes Historical Provider, MD  traMADol (ULTRAM) 50 MG tablet Take 50 mg by mouth every 6 (six) hours as needed. Pain    Yes Historical Provider, MD  traZODone (DESYREL) 50 MG tablet Take 50-100 mg by mouth at bedtime as needed. Sleep  05/18/11  Yes Historical Provider, MD  dexlansoprazole (DEXILANT) 60 MG capsule Take 1 capsule (60 mg total) by mouth daily. 09/27/10 10/28/10  Nira Retort, NP  rivaroxaban (XARELTO) 10 MG TABS tablet Take 1 tablet (10 mg total) by mouth daily with breakfast. Take for two and a half more weeks, then discontinue Xarelto. After completing the Xarelto, the patient may resume their 325 mg Aspirin. 09/27/11   Finnley [place]so Julien Girt, PA  Diet: Cardiac diet and Diabetic diet Activity:WBAT Follow-up:in 2 weeks Disposition - Home Discharged Condition: good   Discharge Orders    Future Appointments: Provider: Department: Dept Phone: Center:   10/24/2011 1:45 PM Bella Kennedy, PT [facility] [phone] None   11/06/2011 10:00 AM [facility] [phone] [facility]     Future Orders Please Complete By Expires   Diet - low sodium heart healthy      Diet Carb Modified      Call MD / Call 911      Comments:   If you experience chest pain or shortness of breath, CALL 911 and be transported to the hospital emergency room.  If you develope a fever above 101 F, pus (white drainage) or increased drainage or redness at the wound, or calf pain, call your surgeon's office.   Discharge instructions      Comments:   Pick up stool softner and laxative for home. Do not submerge incision under water. May shower. Continue to use ice for pain and swelling from surgery.  Take Xarelto for two and a half more weeks, then discontinue Xarelto.  After completing the Xarelto, the patient may resume their 325 mg Aspirin.   Increase activity slowly as tolerated      Weight Bearing as taught in  Physical Therapy      Comments:   Use a walker or crutches as instructed.   Patient may shower      Comments:   You may shower without a dressing once there is no drainage.  Do not wash over the wound.  If drainage remains, do not shower until drainage stops.   Driving restrictions      Comments:   No driving until released by the physician.   Lifting restrictions      Comments:   No lifting until released by the physician.   TED hose      Comments:   Use stockings (TED hose) for 3 weeks on both leg(s).  You may remove them at night for sleeping.   Change dressing      Comments:   Change dressing daily with sterile 4 x 4 inch gauze dressing and apply TED hose. Do not submerge the incision under water.   Do not put a pillow under the knee. Place it under the heel.      Do not sit on low chairs, stoools or toilet seats, as it may be difficult to get up from low surfaces      Constipation Prevention      Comments:   Drink plenty of fluids.  Prune juice may be helpful.  You may use a stool softener, such as Colace (over the counter) 100 mg twice a day.  Use MiraLax (over the counter) for constipation as needed.     Medication List  As of 10/11/2011  9:48 PM   STOP taking these medications         ALIGN 4 MG Caps      aspirin 325 MG tablet         TAKE these medications         acetaminophen 500 MG tablet   Commonly known as: TYLENOL   Take 500-1,000 mg by mouth every 6 (six) hours as needed. Pain        ALPRAZolam 0.25 MG tablet   Commonly known as: XANAX   Take 0.25 mg by mouth 2 (two) times daily as needed. Anxiety  atorvastatin 10 MG tablet   Commonly known as: LIPITOR   Take 10 mg by mouth daily after breakfast.      cholestyramine 4 GM/DOSE powder   Commonly known as: QUESTRAN   Take 1 to 2 grams twice daily as directed.      citalopram 20 MG tablet   Commonly known as: CELEXA   Take 20 mg by mouth daily after breakfast.      dexlansoprazole 60 MG  capsule   Commonly known as: DEXILANT   Take 1 capsule (60 mg total) by mouth daily.      esomeprazole 40 MG capsule   Commonly known as: NEXIUM   Take 40 mg by mouth daily before breakfast.      levothyroxine 50 MCG tablet   Commonly known as: SYNTHROID, LEVOTHROID   Take 50 mcg by mouth daily before breakfast.      losartan 100 MG tablet   Commonly known as: COZAAR   Take 50 mg by mouth daily after breakfast.      RESTASIS 0.05 % ophthalmic emulsion   Generic drug: cycloSPORINE   Place 1 drop into both eyes every 12 (twelve) hours.      rivaroxaban 10 MG Tabs tablet   Commonly known as: XARELTO   Take 1 tablet (10 mg total) by mouth daily with breakfast. Take for two and a half more weeks, then discontinue Xarelto. After completing the Xarelto, the patient may resume their 325 mg Aspirin.      traMADol 50 MG tablet   Commonly known as: ULTRAM   Take 50 mg by mouth every 6 (six) hours as needed. Pain        traZODone 50 MG tablet   Commonly known as: DESYREL   Take 50-100 mg by mouth at bedtime as needed. Sleep             Follow-up Information    Follow up with [name], MD. Schedule an appointment as soon as possible for a visit in 2 weeks.   Contact information:   [facility] [address] [place] [address] [phone]          Signed: Patrica Duel 10/11/2011, 9:48 PM

## 2011-10-16 DIAGNOSIS — F411 Generalized anxiety disorder: Secondary | ICD-10-CM | POA: Diagnosis not present

## 2011-10-16 DIAGNOSIS — Z96659 Presence of unspecified artificial knee joint: Secondary | ICD-10-CM | POA: Diagnosis not present

## 2011-10-16 DIAGNOSIS — Z471 Aftercare following joint replacement surgery: Secondary | ICD-10-CM | POA: Diagnosis not present

## 2011-10-16 DIAGNOSIS — I10 Essential (primary) hypertension: Secondary | ICD-10-CM | POA: Diagnosis not present

## 2011-10-16 DIAGNOSIS — M5137 Other intervertebral disc degeneration, lumbosacral region: Secondary | ICD-10-CM | POA: Diagnosis not present

## 2011-10-20 DIAGNOSIS — M5137 Other intervertebral disc degeneration, lumbosacral region: Secondary | ICD-10-CM | POA: Diagnosis not present

## 2011-10-20 DIAGNOSIS — F411 Generalized anxiety disorder: Secondary | ICD-10-CM | POA: Diagnosis not present

## 2011-10-20 DIAGNOSIS — Z471 Aftercare following joint replacement surgery: Secondary | ICD-10-CM | POA: Diagnosis not present

## 2011-10-20 DIAGNOSIS — I10 Essential (primary) hypertension: Secondary | ICD-10-CM | POA: Diagnosis not present

## 2011-10-20 DIAGNOSIS — Z96659 Presence of unspecified artificial knee joint: Secondary | ICD-10-CM | POA: Diagnosis not present

## 2011-10-24 ENCOUNTER — Ambulatory Visit (HOSPITAL_COMMUNITY)
Admission: RE | Admit: 2011-10-24 | Discharge: 2011-10-24 | Disposition: A | Discharge status: Home / Self Care | Payer: Medicare Other | Source: Ambulatory Visit | Attending: Orthopedic Surgery | Admitting: Orthopedic Surgery

## 2011-10-24 DIAGNOSIS — IMO0001 Reserved for inherently not codable concepts without codable children: Secondary | ICD-10-CM | POA: Diagnosis not present

## 2011-10-24 DIAGNOSIS — M25669 Stiffness of unspecified knee, not elsewhere classified: Secondary | ICD-10-CM | POA: Diagnosis not present

## 2011-10-24 DIAGNOSIS — R262 Difficulty in walking, not elsewhere classified: Secondary | ICD-10-CM | POA: Insufficient documentation

## 2011-10-24 DIAGNOSIS — M6281 Muscle weakness (generalized): Secondary | ICD-10-CM | POA: Insufficient documentation

## 2011-10-24 DIAGNOSIS — M25569 Pain in unspecified knee: Secondary | ICD-10-CM | POA: Diagnosis not present

## 2011-10-24 NOTE — Evaluation (Addendum)
Physical Therapy Evaluation  Patient Details  Name: Angela Galvan MRN: [id number] Date of Birth: 01/27/1933  Today's Date: 10/24/2011 Time: 1191-4782 PT Time Calculation (min): 42 min  Visit#: 1  of 12   Re-eval: 11/23/11 Assessment Diagnosis: L TKR Surgical Date: 09/25/11 Next MD Visit: 11/03/2011 Prior Therapy: HH  Authorization: medicare   Authorization Time Period:    Authorization Visit#: 1  of 12    Past Medical History:  Past Medical History  Diagnosis Date  . Diverticulitis     s/p colectomy in 1991  . IBS (irritable bowel syndrome)   . HTN (hypertension)   . Hyperlipidemia   . S/P colonoscopy 2003    minimal internal hemorrhoids, pancolonic diverticula, biopsy of rectum: lymphoplasmacytic microscopic colitis, colon biopsies normal  . S/P colonoscopy 2012    pancolonic diverticula, nl TI, external hemorrhoidal tag,/rectum bx no abnormalities  . S/P endoscopy 2012    normal esophagus, stenotic pyloric channel, TTS dilation  . Diarrhea   . GERD (gastroesophageal reflux disease)   . Arthritis   . Hypothyroidism   . Diabetes mellitus     no meds.-diet controlled  . Stroke 08/10/2006    no residual   Past Surgical History:  Past Surgical History  Procedure Date  . Colectomy   . Cholecystectomy   . Right ovarian tumor removed as teenager   . Abdominal hysterectomy   . Knee surgery   . Bilateral cataracts   . Esophagogastroduodenoscopy 10/11/2010    Normal esophagus, small hiatal hernia,Stenotic pyloric channel likely critical.  Likely contributing to some of the patient's symptoms, status post TTS balloon dilation Normal D1-D3 status post biopsy.  D2-D3 atrophic-appearing gastric mucosa status post biopsy  . Ileocolonoscopy 10/11/2010    external hemorrhoidal tag.  Normal rectum status post biopsy Pancolonic diverticula status post segmental biopsy.  Normal  terminal ileum  . Colonoscopy 09/11/01    Minimal internal hemorrhoids, otherwise normal rectum .  Scattered pancolonic diverticula (few) The colonic mucosa otherwise appeared normal status post biopsies and stool sampling as described above.  . Total knee arthroplasty 09/25/2011    Procedure: TOTAL KNEE ARTHROPLASTY;  Surgeon: [name], MD;  Location: [facility];  Service: Orthopedics;  Laterality: Left;    Subjective Symptoms/Limitations Symptoms: Angela Galvan had a TKR on 09/25/11.  She is currently using a cane.  She had home health until 10/20/2011.   How long can you sit comfortably?: The patient states she is able to sit as long as she wants. How long can you stand comfortably?: The patient states that she is able to stand for an hour. How long can you walk comfortably?: The patient states that the longest she has walked for forty five minutes with a walker.  She has not walked long distances with her cane yet. Pain Assessment Currently in Pain?: Yes Pain Score:   2 (Highest pain in the past week has been a five.) Pain Location: Knee Pain Orientation: Left Pain Onset: 1 to 4 weeks ago Pain Frequency: Constant Pain Relieving Factors: ice; elevation  Prior Functioning  Home Living Lives With: Spouse Type of Home: House Home Access: Stairs to enter [employer] of Steps: 3 Prior Function Driving: No Vocation: Retired Leisure: Hobbies-yes (Comment) Comments: crochet, puzzles,  Cognition/Observation Cognition Overall Cognitive Status: Appears within functional limits for tasks assessed  Sensation/Coordination/Flexibility/Functional Tests Functional Tests Functional Tests: LEFS 46/64 taking running/hopping activities out.  Assessment LLE AROM (degrees) Left Knee Extension 0-130: 12  Left Knee Flexion 0-140: 92  LLE PROM (degrees) Left Knee Extension 0-130: 8 Left Knee Flexion 0-140: 98 LLE Strength Left Hip Flexion: 5/5 Left Hip Extension: 3+/5 Left Hip ABduction: 4/5 Left Hip ADduction: 5/5 Left Knee Flexion: 3+/5 Left Knee Extension: 3+/5 Left Ankle  Dorsiflexion: 3+/5 Left Ankle Plantar Flexion: 4/5  Exercise/Treatments    For ROM and strengthening see doc flowsheet.    Physical Therapy Assessment and Plan PT Assessment and Plan Clinical Impression Statement: Pt with decreased ROM, strength and functional activity who will benefit from skilled outpatient therapy to increase funcitonal level and improve quality of life. Pt will benefit from skilled therapeutic intervention in order to improve on the following deficits: Decreased strength;Decreased mobility;Difficulty walking;Pain Rehab Potential: Good PT Frequency: Min 3X/week PT Duration: 4 weeks PT Treatment/Interventions: Gait training;Functional mobility training;Therapeutic exercise;Therapeutic activities;Neuromuscular re-education PT Plan: begin bike, rockerboard, standing knee flexion, terminal extension, and SLS next treatment.    Goals Home Exercise Program Pt will Perform Home Exercise Program: Independently PT Short Term Goals Time to Complete Short Term Goals: 2 weeks PT Short Term Goal 1: strength increased 1/2 grade to allow pt to feel comfortable walking inside without her cane. PT Short Term Goal 2: ROM 5-105 to allow more normalized walking and incrased comfort in car rides. PT Long Term Goals Time to Complete Long Term Goals: 4 weeks PT Long Term Goal 1: Pain to be no greater than a 2 with day to day activites. PT Long Term Goal 2: ROM 0-120 to allow normalized gait.  Pt to be able to car ride x 2 hours. Long Term Goal 3: LEFS to increase by 10 points. Long Term Goal 4: Pt strength to be increased one grade to allow pt to fell comfortable walking without an assistive device outside.  Problem List Patient Active Problem List  Diagnoses  . Epigastric pain  . Diarrhea  . GERD (gastroesophageal reflux disease)  . OA (osteoarthritis) of knee  . Postop Acute blood loss anemia  . Stiffness of joint, not elsewhere classified, lower leg  . Difficulty in walking    . Pain in joint, lower leg    PT - End of Session Activity Tolerance: Patient tolerated treatment well General Behavior During Session: [facility] for tasks performed Cognition: [facility] for tasks performed PT Plan of Care PT Home Exercise Plan: given Consulted and Agree with Plan of Care: Patient  [phone] CJ current;  [phone] CI goal  Laiba Fuerte,CINDY 10/24/2011, 3:33 PM  Physician Documentation Your signature is required to indicate approval of the treatment plan as stated above.  Please sign and either send electronically or make a copy of this report for your files and return this physician signed original.   Please mark one 1.__approve of plan  2. ___approve of plan with the following conditions.   ______________________________                                                          _____________________ Physician Signature  Date  

## 2011-10-26 ENCOUNTER — Ambulatory Visit (HOSPITAL_COMMUNITY)
Admission: RE | Admit: 2011-10-26 | Discharge: 2011-10-26 | Disposition: A | Discharge status: Home / Self Care | Payer: Medicare Other | Source: Ambulatory Visit | Attending: Internal Medicine | Admitting: Internal Medicine

## 2011-10-26 NOTE — Progress Notes (Signed)
Physical Therapy Treatment Patient Details  Name: Angela Galvan MRN: [id number] Date of Birth: [birth year]  Today's Date: 10/26/2011 Time: 4098-1191 PT Time Calculation (min): 42 min Visit#: 2  of 12   Re-eval: 11/23/11 Diagnosis: L TKR Surgical Date: 09/25/11 Next MD Visit: 11/03/2011 Charges:  therex 32' , manual 8'  Authorization: Medicare  (at evaluation, [phone] CJ current;  [id number] CI goal)  Authorization Time Period:    Authorization Visit#: 2  of 10    Subjective: Symptoms/Limitations Symptoms: Pt. states her pain is not as bad as last night.  Currently 4/10; last night 8/10. Pain Assessment Currently in Pain?: Yes Pain Score:   4 Pain Location: Knee Pain Orientation: Left   Exercise/Treatments Aerobic Stationary Bike: 6' @ 2.0, seat 6 full rev. after warm-up Standing Heel Raises: 10 reps;Limitations Heel Raises Limitations: toeraises 10 reps Knee Flexion: 10 reps Rocker Board: 2 minutes SLS: 18"max of 3 Seated Long Arc Quad: 10 reps Supine Quad Sets: 10 reps;Limitations Quad Sets Limitations: prior to PROM Heel Slides: 10 reps;Limitations Heel Slides Limitations: prior to PROM Knee Extension: PROM Knee Flexion: PROM Sidelying Hip ABduction: 10 reps Prone  Hamstring Curl: 10 reps Hip Extension: 10 reps   Manual Therapy Manual Therapy: Massage Massage: Retro to L knee to decrease edema (pt. to ice at home)  Physical Therapy Assessment and Plan PT Assessment and Plan Clinical Impression Statement: Pt. with heat/swelling/medial redness around L knee.  Began retro to decrease swelling, pt. very sensitive on medial side of knee.  Added standing exercises per POC without difficulty. Pt. unable to make a full revolution on bike initially but able to after knee stretched/warmed up.  Pt very sensitive to PROM.  Suggested pt. ice /elevate often at home to help decrease swelling/improve ROM.  Pt. educated on warning signs of infection. PT Plan: Continue to progress;  increase reps and continue retro massage if swelling/redness persist.    Problem List Patient Active Problem List  Diagnoses  . Epigastric pain  . Diarrhea  . GERD (gastroesophageal reflux disease)  . OA (osteoarthritis) of knee  . Postop Acute blood loss anemia  . Stiffness of joint, not elsewhere classified, lower leg  . Difficulty in walking  . Pain in joint, lower leg    PT - End of Session Activity Tolerance: Patient tolerated treatment well General Behavior During Session: [facility] for tasks performed Cognition: [facility] for tasks performed  GP No functional reporting required  Lurena Nida, PTA/CLT 10/26/2011, 1:51 PM

## 2011-10-30 ENCOUNTER — Ambulatory Visit (HOSPITAL_COMMUNITY)
Admission: RE | Admit: 2011-10-30 | Discharge: 2011-10-30 | Disposition: A | Discharge status: Home / Self Care | Payer: Medicare Other | Source: Ambulatory Visit | Attending: Internal Medicine | Admitting: Internal Medicine

## 2011-10-30 DIAGNOSIS — M25669 Stiffness of unspecified knee, not elsewhere classified: Secondary | ICD-10-CM | POA: Insufficient documentation

## 2011-10-30 DIAGNOSIS — M25569 Pain in unspecified knee: Secondary | ICD-10-CM | POA: Insufficient documentation

## 2011-10-30 DIAGNOSIS — IMO0001 Reserved for inherently not codable concepts without codable children: Secondary | ICD-10-CM | POA: Diagnosis not present

## 2011-10-30 DIAGNOSIS — M6281 Muscle weakness (generalized): Secondary | ICD-10-CM | POA: Diagnosis not present

## 2011-10-30 DIAGNOSIS — R262 Difficulty in walking, not elsewhere classified: Secondary | ICD-10-CM | POA: Insufficient documentation

## 2011-10-30 NOTE — Progress Notes (Signed)
Physical Therapy Treatment Patient Details  Name: Angela Galvan MRN: [id number] Date of Birth: 02/07/1933  Today's Date: 10/30/2011 Time: 3244-0102 PT Time Calculation (min): 46 min Visit#: 3  of 12   Re-eval: 11/23/11 Charges: Therex x 30' Manual x 10'  Authorization: Medicare (at evaluation, [id number] CJ current; [phone] CI goal)  Authorization Time Period:    Authorization Visit#: 3  of 8    Subjective: Symptoms/Limitations Symptoms: Pt reports HEP compliance. Pain Assessment Currently in Pain?: Yes Pain Score:   5 Pain Location: Knee Pain Orientation: Left   Exercise/Treatments Aerobic Stationary Bike: 6' @ 2.0, seat 6 full rev. after warm-up Standing Heel Raises: 10 reps;Limitations Heel Raises Limitations: toeraises 10 reps Knee Flexion: 10 reps Lateral Step Up: 10 reps;Hand Hold: 2;Step Height: 4";Left Forward Step Up: 10 reps;Left;Hand Hold: 2;Step Height: 4" Rocker Board: 2 minutes SLS: 12"max of 5 Supine Quad Sets: 10 reps;Limitations Short Arc Quad Sets: 10 reps;Left Heel Slides: 10 reps;Limitations Knee Extension: PROM Knee Flexion: PROM Other Supine Knee Exercises:     Manual Therapy Manual Therapy: Massage Massage: Retro to L knee to decrease edema along with MFR techniques to medial knee to decrease spasms/adhesions  Physical Therapy Assessment and Plan PT Assessment and Plan Clinical Impression Statement: Pt presents with moderate swelling and heat in L knee. Pt continues to be very sensitive in medial knee secondary to spasms and adhesions. Retro massage completed along with MFR techniques to decrease pain in medial knee. Pt denies need for ice as she is going straight home. Pt reports 2/10 pain at end of session. PT Plan: Continue to progress per PT POC.    Problem List Patient Active Problem List  Diagnoses  . Epigastric pain  . Diarrhea  . GERD (gastroesophageal reflux disease)  . OA (osteoarthritis) of knee  . Postop Acute blood loss anemia    . Stiffness of joint, not elsewhere classified, lower leg  . Difficulty in walking  . Pain in joint, lower leg    PT - End of Session Activity Tolerance: Patient tolerated treatment well General Behavior During Session: [facility] for tasks performed Cognition: [facility] for tasks performed   Seth Bake, PTA 10/30/2011, 2:40 PM

## 2011-11-01 ENCOUNTER — Ambulatory Visit (HOSPITAL_COMMUNITY)
Admission: RE | Admit: 2011-11-01 | Discharge: 2011-11-01 | Disposition: A | Discharge status: Home / Self Care | Payer: Medicare Other | Source: Ambulatory Visit | Attending: Physical Therapy | Admitting: Physical Therapy

## 2011-11-01 ENCOUNTER — Other Ambulatory Visit: Payer: Self-pay | Admitting: Internal Medicine

## 2011-11-01 NOTE — Telephone Encounter (Signed)
Okay for her #4 boxes. Patient may need prior authorization.

## 2011-11-01 NOTE — Telephone Encounter (Signed)
Tried to call pt- NA 

## 2011-11-01 NOTE — Telephone Encounter (Signed)
Pt called to let us know that the Aciphex samples were working for her.  If we have any samples she would like some more.

## 2011-11-01 NOTE — Telephone Encounter (Signed)
Routed to refill box 

## 2011-11-01 NOTE — Telephone Encounter (Signed)
OK for #2 boxes plus rebate card

## 2011-11-01 NOTE — Progress Notes (Signed)
Physical Therapy Treatment Patient Details  Name: Mckenzie B Brienza MRN: [id number] Date of Birth: 1932/12/03  Today's Date: 11/01/2011 Time: 4696-2952 PT Time Calculation (min): 60 min  Visit#: 4  of 12   Re-eval: 11/23/11 Charges: Therex x 40' Manual x 10'   Authorization: Medicare (at evaluation, [phone] CJ current; [phone] CI goal)  Authorization Time Period:    Authorization Visit#: 4  of 10    Subjective: Symptoms/Limitations Symptoms: The swelling in my knee went down yesterday but it's back today. Pain Assessment Currently in Pain?: No/denies Pain Score: 0-No pain   Exercise/Treatments Aerobic Stationary Bike: 6' @ 2.0, seat 6 full rev. after warm-up Standing Heel Raises: 15 reps Heel Raises Limitations: toeraises 15 reps Knee Flexion: 10 reps;Limitations Knee Flexion Limitations: 3# Lateral Step Up: 15 reps;Step Height: 4";Left Forward Step Up: 15 reps;Step Height: 4";Hand Hold: 1;Left Step Down: 10 reps;Left;Step Height: 4";Hand Hold: 1 Rocker Board: 2 minutes SLS: 13"max of 5 Seated Long Arc Quad: 10 reps;Weights Long Arc Quad Weight: 3 lbs. Supine Short Arc Quad Sets: 15 reps Heel Slides: 10 reps Heel Slides Limitations:   Knee Extension: PROM Knee Flexion: PROM Sidelying Hip ABduction: 15 reps Prone  Hip Extension: 15 reps Prone Knee Hang: 3 minutes;Limitations Prone Knee Hang Limitations: Given for HEP   Manual Therapy Manual Therapy: Massage Massage: Retro to L knee to decrease edema along with MFR techniques to medial knee to decrease spasms/adhesions  Physical Therapy Assessment and Plan PT Assessment and Plan Clinical Impression Statement: Pt completes therex with good form but requires vc's to slow down. Pt continues to present with sensitivity in medial knee with manual therapy. Sensitivity decreased after retro massage, per pt. Pt reports 0/10 pain at end of session. PT Plan: Continue to progress strength/ROM per PT POC.     Problem  List Patient Active Problem List  Diagnoses  . Epigastric pain  . Diarrhea  . GERD (gastroesophageal reflux disease)  . OA (osteoarthritis) of knee  . Postop Acute blood loss anemia  . Stiffness of joint, not elsewhere classified, lower leg  . Difficulty in walking  . Pain in joint, lower leg    PT - End of Session Activity Tolerance: Patient tolerated treatment well General Behavior During Session: [facility] for tasks performed Cognition: [facility] for tasks performed   Seth Bake, PTA 11/01/2011, 3:45 PM

## 2011-11-02 MED ORDER — RABEPRAZOLE SODIUM 20 MG PO TBEC: 20.0000 mg | DELAYED_RELEASE_TABLET | Freq: Every day | ORAL | Status: DC

## 2011-11-02 NOTE — Telephone Encounter (Signed)
Addended by: Joselyn Arrow on: 11/02/2011 06:33 PM   Modules accepted: Orders

## 2011-11-02 NOTE — Telephone Encounter (Signed)
Tried to call pt- LM with female for return call. 

## 2011-11-02 NOTE — Telephone Encounter (Signed)
Pt aware, she needs rx sent to pharmacy.

## 2011-11-02 NOTE — Telephone Encounter (Signed)
Tried to call pt- NA 

## 2011-11-03 ENCOUNTER — Ambulatory Visit (HOSPITAL_COMMUNITY)
Admission: RE | Admit: 2011-11-03 | Discharge: 2011-11-03 | Disposition: A | Discharge status: Home / Self Care | Payer: Medicare Other | Source: Ambulatory Visit | Attending: Physical Therapy | Admitting: Physical Therapy

## 2011-11-03 ENCOUNTER — Encounter: Payer: Self-pay | Admitting: Internal Medicine

## 2011-11-03 DIAGNOSIS — M25669 Stiffness of unspecified knee, not elsewhere classified: Secondary | ICD-10-CM

## 2011-11-03 DIAGNOSIS — R262 Difficulty in walking, not elsewhere classified: Secondary | ICD-10-CM

## 2011-11-03 DIAGNOSIS — M25569 Pain in unspecified knee: Secondary | ICD-10-CM | POA: Diagnosis not present

## 2011-11-03 NOTE — Progress Notes (Signed)
Physical Therapy Treatment Patient Details  Name: Angela Galvan MRN: [id number] Date of Birth: 05/24/1933  Today's Date: 11/03/2011 Time: 0940-1022 PT Time Calculation (min): 42 min  Visit#: 5  of 12   Re-eval: 11/23/11    Authorization: G code eval CJ Goal CI  Authorization Time Period:    Authorization Visit#:   of     Subjective: Symptoms/Limitations Symptoms: I know the weather is bothering my knee because both knees hurt.    Exercise/Treatments     Stretches Active Hamstring Stretch: 3 reps;30 seconds Aerobic Stationary Bike: 6@ 2.0   Standing Lateral Step Up: Left;Hand Hold: 0;Step Height: 6" Forward Step Up: Left;Hand Hold: 0;Step Height: 6" Rocker Board: 2 minutes Seated Long Arc Quad: Left;15 reps;Weights;Limitations Long Arc Quad Weight: 3 lbs. Other Seated Knee Exercises: sit to stand x 10 Supine Quad Sets: 15 reps Sidelying   Prone  Hamstring Curl: 10 reps;Limitations Hamstring Curl Limitations: 3 Hip Extension: 15 reps      Physical Therapy Assessment and Plan PT Assessment and Plan Clinical Impression Statement: COntinute to stress hip extension, Knee ext/flex strength as well as ROM.  Pt able to complete ADL's and sleep better.  Pt needs verbal cues for proper form PT Plan: gait train for proper stride length heel strike    Goals    Problem List Patient Active Problem List  Diagnoses  . Epigastric pain  . Diarrhea  . GERD (gastroesophageal reflux disease)  . OA (osteoarthritis) of knee  . Postop Acute blood loss anemia  . Stiffness of joint, not elsewhere classified, lower leg  . Difficulty in walking  . Pain in joint, lower leg    PT - End of Session Activity Tolerance: Patient tolerated treatment well General Behavior During Session: [facility] for tasks performed Cognition: [facility] for tasks performed  GP No functional reporting required  Gail Vendetti,CINDY 11/03/2011, 10:39 AM

## 2011-11-06 ENCOUNTER — Ambulatory Visit (HOSPITAL_COMMUNITY)
Admission: RE | Admit: 2011-11-06 | Discharge: 2011-11-06 | Disposition: A | Discharge status: Home / Self Care | Payer: Medicare Other | Source: Ambulatory Visit | Attending: Internal Medicine | Admitting: Internal Medicine

## 2011-11-06 DIAGNOSIS — Z139 Encounter for screening, unspecified: Secondary | ICD-10-CM

## 2011-11-06 DIAGNOSIS — Z1231 Encounter for screening mammogram for malignant neoplasm of breast: Secondary | ICD-10-CM | POA: Insufficient documentation

## 2011-11-06 NOTE — Progress Notes (Signed)
Physical Therapy Treatment Patient Details  Name: Angela Galvan MRN: [id number] Date of Birth: [birth year]  Today's Date: 11/06/2011 Time: 1016-1054 PT Time Calculation (min): 38 min  Visit#: 6  of 12   Re-eval: 11/23/11    Authorization:    Authorization Time Period:   G code current CJ Goal CI Authorization Visit#:   of     Subjective: Symptoms/Limitations Symptoms: no pain this morning only stiffness of L knee until use of bike; pain reported 3/10. Patient also reports that today is the first time she is walking without the cane due to having to hold umbrella instead.. Pain Assessment Pain Score:   3 Pain Location: Knee Pain Orientation: Left     Exercise/Treatments        Stretches Active Hamstring Stretch: 3 reps;30 seconds Aerobic Stationary Bike: 6@ 2.0 Standing Lateral Step Up: Left;Hand Hold: 0;Step Height: 6" Forward Step Up: Left;Hand Hold: 0;Step Height: 6" Rocker Board: 2 minutes Other Standing Knee Exercises: heel walk 1 RT Seated Long Arc Quad: 2 sets;10 reps;Weights;Limitations Long Arc Quad Weight: 3 lbs. Other Seated Knee Exercises: sit to stand x 10 Supine Short Arc Quad Sets: 2 sets;10 reps Heel Slides: 10 reps;Limitations (#3)   Prone  Hamstring Curl: 10 reps;Limitations;15 reps Hamstring Curl Limitations: 3 Hip Extension: 15 reps Other Prone Exercises: heel squeeze x10, 5" hold      Physical Therapy Assessment and Plan PT Assessment and Plan Clinical Impression Statement: Patient tolerated all therex well today with less L knee pain at completion of session.COntinute to stress hip extension, Knee ext/flex strength as well as ROM       Problem List Patient Active Problem List  Diagnoses  . Epigastric pain  . Diarrhea  . GERD (gastroesophageal reflux disease)  . OA (osteoarthritis) of knee  . Postop Acute blood loss anemia  . Stiffness of joint, not elsewhere classified, lower leg  . Difficulty in walking  . Pain in joint,  lower leg    PT - End of Session Activity Tolerance: Patient tolerated treatment well General Behavior During Session: [facility] for tasks performed Cognition: [facility] for tasks performed  GP No functional reporting required  Jonas Goh ATKINSO 11/06/2011, 11:03 AM

## 2011-11-08 ENCOUNTER — Ambulatory Visit (HOSPITAL_COMMUNITY)
Admission: RE | Admit: 2011-11-08 | Discharge: 2011-11-08 | Disposition: A | Discharge status: Home / Self Care | Payer: Medicare Other | Source: Ambulatory Visit | Attending: Internal Medicine | Admitting: Internal Medicine

## 2011-11-08 NOTE — Progress Notes (Signed)
Physical Therapy Treatment Patient Details  Name: Angela Galvan MRN: [id number] Date of Birth: 1932/06/16  Today's Date: 11/08/2011 Time: 1305-1350 PT Time Calculation (min): 45 min  Visit#: 7  of 12   Re-eval: 11/23/11 Charges: Therex x 38'  Authorization: G code eval CJ Goal CI     Subjective: Symptoms/Limitations Symptoms: Pt states that she did not sleep well last night secondary to pain and stiffness. Pain Assessment Pain Score:   3 Pain Location: Knee Pain Orientation: Left   Exercise/Treatments Aerobic Stationary Bike: 6@ 2.0 Standing Lateral Step Up: 15 reps;Step Height: 6";Left Forward Step Up: 15 reps;Left;Step Height: 6" Step Down: 10 reps;Step Height: 6";Left Rocker Board: 2 minutes Gait Training: 8' around dept with 3# wts in B UE to facilitate armswing to improve pelvic rotation Other Standing Knee Exercises: heel walk/toe walk 1 RT Supine Heel Slides: 10 reps Knee Extension: PROM Knee Flexion: PROM   Manual Therapy Manual Therapy: Joint mobilization Joint Mobilization: Grade II-III to increase L knee flexion  Physical Therapy Assessment and Plan PT Assessment and Plan Clinical Impression Statement: Pt presents with decreased swelling in L knee. Decreased tenderness in medial knee noted with palpation. Began gait training to improve gait mechanics with vc's to improve heel strike and knee flexion. Pt completes therex well with minimal need for cueing. Pt denies need for ice. PT Plan: Continue to progress per PT POC.     Problem List Patient Active Problem List  Diagnosis  . Epigastric pain  . Diarrhea  . GERD (gastroesophageal reflux disease)  . OA (osteoarthritis) of knee  . Postop Acute blood loss anemia  . Stiffness of joint, not elsewhere classified, lower leg  . Difficulty in walking  . Pain in joint, lower leg    PT - End of Session Activity Tolerance: Patient tolerated treatment well General Behavior During Session: [facility] for tasks  performed Cognition: [facility] for tasks performed  Seth Bake, PTA 11/08/2011, 2:29 PM

## 2011-11-10 ENCOUNTER — Ambulatory Visit (HOSPITAL_COMMUNITY)
Admission: RE | Admit: 2011-11-10 | Discharge: 2011-11-10 | Disposition: A | Discharge status: Home / Self Care | Payer: Medicare Other | Source: Ambulatory Visit | Attending: Internal Medicine | Admitting: Internal Medicine

## 2011-11-10 NOTE — Progress Notes (Signed)
Physical Therapy Treatment Patient Details  Name: Angela Galvan MRN: [id number] Date of Birth: Jul 10, 1932  Today's Date: 11/10/2011 Time: 4782-9562 PT Time Calculation (min): 45 min  Visit#: 8  of 12   Re-eval: 11/23/11 Charges: Therex x 28' Manual x 10'  Authorization: G code eval CJ Goal CI   Authorization Time Period:    Authorization Visit#: 5  of 10    Subjective: Symptoms/Limitations Symptoms: Pt states that both of her knees were swollen in hurting yesterday. Pt attributes this to the weather. Pain Assessment Currently in Pain?: Yes Pain Score:   4 Pain Location: Knee Pain Orientation: Right;Left   Exercise/Treatments Aerobic Stationary Bike: 6@ 2.0 Standing Lateral Step Up: 15 reps;Step Height: 6";Left Forward Step Up: 15 reps;Left;Step Height: 6" Step Down: 15 reps;Left;Step Height: 6" Rocker Board: 2 minutes Other Standing Knee Exercises: heel walk/toe walk 2RT Supine Heel Slides: 10 reps Knee Extension: PROM Knee Flexion: PROM   Manual Therapy Manual Therapy: Joint mobilization Joint Mobilization: Grade II-III to increase L knee flexion  Massage: Retro to L knee to decrease edema  Physical Therapy Assessment and Plan PT Assessment and Plan Clinical Impression Statement: Pt continues to progress well. Began standing TKE to improve active ext. Pt presents with come edema around anterior knee. Edema decreased after retro massage. Pt reports pain decrease to 2/10 at end of session. PT Plan: Continue to progress strength and ROM per PT POC.     Problem List Patient Active Problem List  Diagnosis  . Epigastric pain  . Diarrhea  . GERD (gastroesophageal reflux disease)  . OA (osteoarthritis) of knee  . Postop Acute blood loss anemia  . Stiffness of joint, not elsewhere classified, lower leg  . Difficulty in walking  . Pain in joint, lower leg    PT - End of Session Activity Tolerance: Patient tolerated treatment well General Behavior During  Session: [facility] for tasks performed Cognition: [facility] for tasks performed   Seth Bake, PTA 11/10/2011, 2:01 PM

## 2011-11-13 ENCOUNTER — Ambulatory Visit (HOSPITAL_COMMUNITY)
Admission: RE | Admit: 2011-11-13 | Discharge: 2011-11-13 | Disposition: A | Discharge status: Home / Self Care | Payer: Medicare Other | Source: Ambulatory Visit | Attending: Internal Medicine | Admitting: Internal Medicine

## 2011-11-13 NOTE — Progress Notes (Signed)
Physical Therapy Treatment Patient Details  Name: Angela Galvan MRN: [id number] Date of Birth: [birth year]  Today's Date: 11/13/2011 Time: 1308-6578 PT Time Calculation (min): 47 min  Visit#: 9  of 12   Re-eval: 11/23/11 Charges: Therex x 38'  Authorization: G code eval CJ Goal CI   Authorization Time Period:    Authorization Visit#: 9  of 10    Subjective: Symptoms/Limitations Symptoms: My knee is less sensitive than it was. Pain Assessment Currently in Pain?: Yes Pain Score:   3 Pain Location: Knee  Objective: L knee AROM 6-110; PROM 2-113 Exercise/Treatments Stretches Knee: Self-Stretch to increase Flexion: 3 reps;30 seconds;Limitations Knee: Self-Stretch Limitations: chair lunge Aerobic Stationary Bike: 8'@ 2.0 Standing Terminal Knee Extension: 15 reps;Theraband Theraband Level (Terminal Knee Extension): Level 4 (Blue) Lateral Step Up: 15 reps;Step Height: 6";Left Forward Step Up: 15 reps;Left;Step Height: 6" Step Down: 15 reps;Left;Step Height: 6" Rocker Board: 2 minutes Other Standing Knee Exercises: heel walk/toe walk 2RT Supine Short Arc Quad Sets: 10 reps;Limitations Short Arc Quad Sets Limitations: 3# Heel Slides: 10 reps Knee Extension: PROM Knee Flexion: PROM   Manual Therapy Manual Therapy: Other (comment) Joint Mobilization: Grade II-III to increase L knee flexion Other Manual Therapy: PROM flex/ext  Physical Therapy Assessment and Plan PT Assessment and Plan Clinical Impression Statement: Pt presents with decreased swelling and improved ROM this session. Began chair lunge to improve flexion. Pt is progressing well and is without complaint throughout session. Pt denies need for ice at end of session and says she will ice at home. PT Plan: Conitnue to progress per PT POC.    Problem List Patient Active Problem List  Diagnosis  . Epigastric pain  . Diarrhea  . GERD (gastroesophageal reflux disease)  . OA (osteoarthritis) of knee  . Postop  Acute blood loss anemia  . Stiffness of joint, not elsewhere classified, lower leg  . Difficulty in walking  . Pain in joint, lower leg    PT - End of Session Activity Tolerance: Patient tolerated treatment well General Behavior During Session: [facility] for tasks performed Cognition: [facility] for tasks performed  Seth Bake, PTA 11/13/2011, 5:33 PM

## 2011-11-15 ENCOUNTER — Ambulatory Visit (HOSPITAL_COMMUNITY)
Admission: RE | Admit: 2011-11-15 | Discharge: 2011-11-15 | Disposition: A | Discharge status: Home / Self Care | Payer: Medicare Other | Source: Ambulatory Visit | Attending: Internal Medicine | Admitting: Internal Medicine

## 2011-11-15 NOTE — Progress Notes (Signed)
Physical Therapy Treatment Patient Details  Name: Angela Galvan MRN: [id number] Date of Birth: 12/28/1932  Today's Date: 11/15/2011 Time: 1478-2956 PT Time Calculation (min): 47 min  Visit#: 10  of 12   Re-eval: 11/23/11 Diagnosis: L TKR Surgical Date: 09/25/11 Charges:  therex 36', manual 8'  Authorization: G code eval CJ Goal CI  Authorization Visit#: 10  of 12    Subjective: Symptoms/Limitations Symptoms: Pt. states her pain is no greater than a 2/10 today. Pain Assessment Currently in Pain?: Yes Pain Score:   2 Pain Location: Knee Pain Orientation: Right;Left   Exercise/Treatments Stretches Knee: Self-Stretch to increase Flexion: 3 reps;30 seconds;Limitations Knee: Self-Stretch Limitations: chair lunge Aerobic Stationary Bike: 8'@ 3.0 seat 6 Standing Terminal Knee Extension: 15 reps;Theraband Theraband Level (Terminal Knee Extension): Level 4 (Blue) Lateral Step Up: 20 reps;Step Height: 6";Hand Hold: 1 Forward Step Up: 20 reps;Step Height: 6";Hand Hold: 1 Step Down: 20 reps;Step Height: 6";Hand Hold: 1 Rocker Board: 2 minutes Other Standing Knee Exercises: heel walk/toe walk 2RT Supine Heel Slides: 10 reps Terminal Knee Extension: 10 reps Knee Extension: PROM Knee Flexion: PROM    Physical Therapy Assessment and Plan PT Assessment and Plan Clinical Impression Statement: Pt. improving with ROM and functional activities.  PT Plan: Re-evaluate next visit.     Problem List Patient Active Problem List  Diagnosis  . Epigastric pain  . Diarrhea  . GERD (gastroesophageal reflux disease)  . OA (osteoarthritis) of knee  . Postop Acute blood loss anemia  . Stiffness of joint, not elsewhere classified, lower leg  . Difficulty in walking  . Pain in joint, lower leg    Lurena Nida, PTA/CLT 11/15/2011, 4:11 PM

## 2011-11-17 ENCOUNTER — Ambulatory Visit (HOSPITAL_COMMUNITY)
Admission: RE | Admit: 2011-11-17 | Discharge: 2011-11-17 | Disposition: A | Discharge status: Home / Self Care | Payer: Medicare Other | Source: Ambulatory Visit | Attending: Physical Therapy | Admitting: Physical Therapy

## 2011-11-17 DIAGNOSIS — M25569 Pain in unspecified knee: Secondary | ICD-10-CM

## 2011-11-17 DIAGNOSIS — M25669 Stiffness of unspecified knee, not elsewhere classified: Secondary | ICD-10-CM

## 2011-11-17 DIAGNOSIS — R262 Difficulty in walking, not elsewhere classified: Secondary | ICD-10-CM

## 2011-11-17 NOTE — Evaluation (Addendum)
Physical Therapy Re-evaluation  Patient Details  Name: Angela Galvan MRN: [id number] Date of Birth: August 29, 1932  Today's Date: 11/17/2011 Time: 7829-5621 PT Time Calculation (min): 45 min  Visit#: 11  of 12   Re-eval:   Assessment Diagnosis: L TKR Surgical Date: 09/25/11 Next MD Visit: 12/21/2011 Prior Therapy: [facility]     Past Medical History:  Past Medical History  Diagnosis Date  . Diverticulitis     s/p colectomy in 1991  . IBS (irritable bowel syndrome)   . HTN (hypertension)   . Hyperlipidemia   . S/P colonoscopy 2003    minimal internal hemorrhoids, pancolonic diverticula, biopsy of rectum: lymphoplasmacytic microscopic colitis, colon biopsies normal  . S/P colonoscopy 2012    pancolonic diverticula, nl TI, external hemorrhoidal tag,/rectum bx no abnormalities  . S/P endoscopy 2012    normal esophagus, stenotic pyloric channel, TTS dilation  . Diarrhea   . GERD (gastroesophageal reflux disease)   . Arthritis   . Hypothyroidism   . Diabetes mellitus     no meds.-diet controlled  . Stroke 08/10/2006    no residual   Past Surgical History:  Past Surgical History  Procedure Date  . Colectomy   . Cholecystectomy   . Right ovarian tumor removed as teenager   . Abdominal hysterectomy   . Knee surgery   . Bilateral cataracts   . Esophagogastroduodenoscopy 10/11/2010    Normal esophagus, small hiatal hernia,Stenotic pyloric channel likely critical.  Likely contributing to some of the patient's symptoms, status post TTS balloon dilation Normal D1-D3 status post biopsy.  D2-D3 atrophic-appearing gastric mucosa status post biopsy  . Ileocolonoscopy 10/11/2010    external hemorrhoidal tag.  Normal rectum status post biopsy Pancolonic diverticula status post segmental biopsy.  Normal  terminal ileum  . Colonoscopy 09/11/01    Minimal internal hemorrhoids, otherwise normal rectum . Scattered pancolonic diverticula (few) The colonic mucosa otherwise appeared normal status post  biopsies and stool sampling as described above.  . Total knee arthroplasty 09/25/2011    Procedure: TOTAL KNEE ARTHROPLASTY;  Surgeon: [name], MD;  Location: [facility];  Service: Orthopedics;  Laterality: Left;    Subjective Symptoms/Limitations Symptoms: Pt is doing her home exercise program.  She is not having any functinal problems How long can you sit comfortably?: The patient is able to sit as long as she wants. How long can you stand comfortably?: The patient is able to stand for four hours. How long can you walk comfortably?: The patient is no longer walking with a cane or walker.  She is able to walk for over thirty minutes. Pain Assessment Currently in Pain?: No/denies (Highest pain has been no more than a 2)   Prior Functioning  Home Living Lives With: Spouse Type of Home: House Home Access: Stairs to enter [employer] of Steps: 3 Prior Function Driving: No Vocation: Retired Leisure: Hobbies-yes (Comment)  Cognition/Observation Cognition Overall Cognitive Status: Appears within functional limits for tasks assessed  Sensation/Coordination/Flexibility/Functional Tests Functional Tests Functional Tests: LEFS improved to a 60/64 was 45  Assessment LLE AROM (degrees) Left Knee Extension: 10  Left Knee Flexion: 115  (was 92) LLE PROM (degrees) Left Knee Extension: 4 Left Knee Flexion: 98 LLE Strength Left Hip Flexion: 5/5 Left Hip Extension: 4/5 (was 3+/5) Left Hip ABduction:  (4+/5 was 4/5) Left Hip ADduction: 5/5 Left Knee Flexion: 5/5 (was 3+/5) Left Knee Extension: 5/5 (was 3+) Left Ankle Dorsiflexion: 5/5 (was 3+/5) Left Ankle Plantar Flexion: 5/5 (was 4/5)  Exercise/Treatments  Stretches Active Hamstring Stretch: 3 reps;30 seconds   Supine Quad Sets: 10 reps Heel Slides: 10 reps Knee Extension: PROM Knee Flexion: PROM Sidelying Hip ABduction: 15 reps Prone  Hip Extension: 15 reps    Physical Therapy Assessment and Plan PT  Assessment and Plan Clinical Impression Statement: Pt functionally able to do all activity.  Remaining deficit is ROM.  Pt no longer in need of skilled care. PT Plan: D/C    Goals Home Exercise Program PT Goal: Perform Home Exercise Program - Progress: Met PT Short Term Goals PT Short Term Goal 1: Pt no longer using cane inside or out. PT Short Term Goal 1 - Progress: Met PT Short Term Goal 2: Pt ROM -10 -115; car rides are no problelm for pt. PT Short Term Goal 2 - Progress: Progressing toward goal PT Long Term Goals PT Long Term Goal 1: Pt only has pain with PROM activity. PT Long Term Goal 1 - Progress: Met PT Long Term Goal 2: Pt ROM -10 to 115; Pt has been on a two hour car ride without difficulty PT Long Term Goal 2 - Progress: Progressing toward goal Long Term Goal 4 Progress: Met  Problem List Patient Active Problem List  Diagnosis  . Epigastric pain  . Diarrhea  . GERD (gastroesophageal reflux disease)  . OA (osteoarthritis) of knee  . Postop Acute blood loss anemia  . Stiffness of joint, not elsewhere classified, lower leg  . Difficulty in walking  . Pain in joint, lower leg    PT - End of Session Activity Tolerance: Patient tolerated treatment well General Behavior During Session: [facility] for tasks performed Cognition: [facility] for tasks performed PT Plan of Care PT Home Exercise Plan: New HEP given to pt.  GP  Functional Reporting Modifier  Discharge Status  [phone] - Mobility: Walking & Moving Around CI - At least 1% but less than 20% impaired, limited or restricted  Goal Status  [id number] - Mobility: [place] & Moving Around CI - At least 1% but less than 20% impaired, limited or restricted   Emmaleah Meroney,CINDY 11/17/2011, 1:56 PM  Physician Documentation Your signature is required to indicate approval of the treatment plan as stated above.  Please sign and either send electronically or make a copy of this report for your files and return this physician signed original.     Please mark one 1.__approve of plan  2. ___approve of plan with the following conditions.   ______________________________                                                          _____________________ Physician Signature                                                                                                             Date

## 2011-11-24 DIAGNOSIS — E119 Type 2 diabetes mellitus without complications: Secondary | ICD-10-CM | POA: Diagnosis not present

## 2011-11-24 DIAGNOSIS — I6789 Other cerebrovascular disease: Secondary | ICD-10-CM | POA: Diagnosis not present

## 2011-11-24 DIAGNOSIS — E785 Hyperlipidemia, unspecified: Secondary | ICD-10-CM | POA: Diagnosis not present

## 2011-11-24 DIAGNOSIS — Z79899 Other long term (current) drug therapy: Secondary | ICD-10-CM | POA: Diagnosis not present

## 2011-11-24 DIAGNOSIS — E039 Hypothyroidism, unspecified: Secondary | ICD-10-CM | POA: Diagnosis not present

## 2011-12-01 DIAGNOSIS — E785 Hyperlipidemia, unspecified: Secondary | ICD-10-CM | POA: Diagnosis not present

## 2011-12-01 DIAGNOSIS — E119 Type 2 diabetes mellitus without complications: Secondary | ICD-10-CM | POA: Diagnosis not present

## 2011-12-01 DIAGNOSIS — M1990 Unspecified osteoarthritis, unspecified site: Secondary | ICD-10-CM | POA: Diagnosis not present

## 2011-12-01 DIAGNOSIS — Z1212 Encounter for screening for malignant neoplasm of rectum: Secondary | ICD-10-CM | POA: Diagnosis not present

## 2011-12-01 DIAGNOSIS — I10 Essential (primary) hypertension: Secondary | ICD-10-CM | POA: Diagnosis not present

## 2011-12-05 ENCOUNTER — Encounter: Payer: Self-pay | Admitting: Internal Medicine

## 2011-12-05 ENCOUNTER — Ambulatory Visit (INDEPENDENT_AMBULATORY_CARE_PROVIDER_SITE_OTHER): Payer: Medicare Other | Admitting: Internal Medicine

## 2011-12-05 VITALS — BP 103/56 | HR 66 | Temp 98.1°F | Ht 60.0 in | Wt 112.2 lb

## 2011-12-05 DIAGNOSIS — R197 Diarrhea, unspecified: Secondary | ICD-10-CM | POA: Diagnosis not present

## 2011-12-05 DIAGNOSIS — K219 Gastro-esophageal reflux disease without esophagitis: Secondary | ICD-10-CM

## 2011-12-05 NOTE — Patient Instructions (Addendum)
Continue Questran  Continue Aciphex  Continiue to avoid non-steroidal drugs as much as possible.  Follow-up appt in 1 year

## 2011-12-05 NOTE — Progress Notes (Signed)
Primary Care Physician:  [name], MD Primary Gastroenterologist:  Dr. Jena Gauss  Pre-Procedure History & Physical: HPI:  Angela Galvan is a 78 y.o. female here for followup of GERD and diarrhea. She is very pleased with the progress she is made with AcipHex. She is no longer having her upper GI tract symptoms. Also, bowel function is pretty much normalized with 1 teaspoon full Questran at bedtime. She is very happy with her progress. Prior biopsies negative for celiac disease and microscopic colitis. She had her pyloric channel dilated because of stenosis with a balloon last year at EGD. She is avoiding all nonsteroidals per my prior recommendations.  Past Medical History  Diagnosis Date  . Diverticulitis     s/p colectomy in 1991  . IBS (irritable bowel syndrome)   . HTN (hypertension)   . Hyperlipidemia   . S/P colonoscopy 2003    minimal internal hemorrhoids, pancolonic diverticula, biopsy of rectum: lymphoplasmacytic microscopic colitis, colon biopsies normal  . S/P colonoscopy 2012    pancolonic diverticula, nl TI, external hemorrhoidal tag,/rectum bx no abnormalities  . S/P endoscopy 2012    normal esophagus, stenotic pyloric channel, TTS dilation  . Diarrhea   . GERD (gastroesophageal reflux disease)   . Arthritis   . Hypothyroidism   . Diabetes mellitus     no meds.-diet controlled  . Stroke 08/10/2006    no residual    Past Surgical History  Procedure Date  . Colectomy   . Cholecystectomy   . Right ovarian tumor removed as teenager   . Abdominal hysterectomy   . Knee surgery   . Bilateral cataracts   . Esophagogastroduodenoscopy 10/11/2010    Normal esophagus, small hiatal hernia,Stenotic pyloric channel likely critical.  Likely contributing to some of the patient's symptoms, status post TTS balloon dilation Normal D1-D3 status post biopsy.  D2-D3 atrophic-appearing gastric mucosa status post biopsy  . Ileocolonoscopy 10/11/2010    external hemorrhoidal tag.  Normal  rectum status post biopsy Pancolonic diverticula status post segmental biopsy.  Normal  terminal ileum  . Colonoscopy 09/11/01    Minimal internal hemorrhoids, otherwise normal rectum . Scattered pancolonic diverticula (few) The colonic mucosa otherwise appeared normal status post biopsies and stool sampling as described above.  . Total knee arthroplasty 09/25/2011    Procedure: TOTAL KNEE ARTHROPLASTY;  Surgeon: [name], MD;  Location: [facility];  Service: Orthopedics;  Laterality: Left;    Prior to Admission medications   Medication Sig Start Date End Date Taking? Authorizing Provider  acetaminophen (TYLENOL) 500 MG tablet Take 500-1,000 mg by mouth every 6 (six) hours as needed. Pain    Yes Historical Provider, MD  ALPRAZolam (XANAX) 0.25 MG tablet Take 0.25 mg by mouth 2 (two) times daily as needed. Anxiety    Yes Historical Provider, MD  atorvastatin (LIPITOR) 10 MG tablet Take 10 mg by mouth daily after breakfast.   Yes Historical Provider, MD  cholestyramine (QUESTRAN) 4 GM/DOSE powder Take 1 to 2 grams twice daily as directed. 09/19/11  Yes Tiffany Kocher, PA  citalopram (CELEXA) 20 MG tablet Take 20 mg by mouth daily after breakfast.   Yes Historical Provider, MD  levothyroxine (SYNTHROID, LEVOTHROID) 50 MCG tablet Take 50 mcg by mouth daily before breakfast.    Yes Historical Provider, MD  losartan (COZAAR) 100 MG tablet Take 50 mg by mouth daily after breakfast.    Yes Historical Provider, MD  RABEprazole (ACIPHEX) 20 MG tablet Take 1 tablet (20 mg total) by mouth daily.  11/02/11 11/01/12 Yes Joselyn Arrow, NP  RESTASIS 0.05 % ophthalmic emulsion Place 1 drop into both eyes every 12 (twelve) hours.  01/11/11  Yes Historical Provider, MD  rivaroxaban (XARELTO) 10 MG TABS tablet Take 1 tablet (10 mg total) by mouth daily with breakfast. Take for two and a half more weeks, then discontinue Xarelto. After completing the Xarelto, the patient may resume their 325 mg Aspirin. 09/27/11  Yes  Alexzandrew Julien Girt, PA  traMADol (ULTRAM) 50 MG tablet Take 50 mg by mouth every 6 (six) hours as needed. Pain    Yes Historical Provider, MD  traZODone (DESYREL) 50 MG tablet Take 50-100 mg by mouth at bedtime as needed. Sleep  05/18/11  Yes Historical Provider, MD  dexlansoprazole (DEXILANT) 60 MG capsule Take 1 capsule (60 mg total) by mouth daily. 09/27/10 10/28/10  Nira Retort, NP  esomeprazole (NEXIUM) 40 MG capsule Take 40 mg by mouth daily before breakfast.    Historical Provider, MD    Allergies as of 12/05/2011  . (No Known Allergies)    Family History  Problem Relation Age of Onset  . Colon cancer Neg Hx     History   Social History  . Marital Status: Married    Spouse Name: N/A    Number of Children: N/A  . Years of Education: N/A   Occupational History  . Not on file.   Social History Main Topics  . Smoking status: Never Smoker   . Smokeless tobacco: Not on file  . Alcohol Use: No  . Drug Use: No  . Sexually Active: Not on file   Other Topics Concern  . Not on file   Social History Narrative  . No narrative on file    Review of Systems: See HPI, otherwise negative ROS  Physical Exam: BP 103/56  Pulse 66  Temp 98.1 F (36.7 C) (Temporal)  Ht 5' (1.524 m)  Wt 112 lb 3.2 oz (50.894 kg)  BMI 21.91 kg/m2 General:   Alert,  Well-developed, well-nourished, pleasant and cooperative in NAD Skin:  Intact without significant lesions or rashes. Eyes:  Sclera clear, no icterus.   Conjunctiva pink. Ears:  Normal auditory acuity. Nose:  No deformity, discharge,  or lesions. Mouth:  No deformity or lesions. Neck:  Supple; no masses or thyromegaly. No significant cervical adenopathy. Lungs:  Clear throughout to auscultation.   No wheezes, crackles, or rhonchi. No acute distress. Heart:  Regular rate and rhythm; no murmurs, clicks, rubs,  or gallops. Abdomen: Non-distended, normal bowel sounds.  Soft and nontender without appreciable mass or hepatosplenomegaly.    Pulses:  Normal pulses noted. Extremities:  Without clubbing or edema.  Impression/Plan:  This nice lady is doing very well at this point in time as far as reflux symptoms quiescentit on AcipHex. Bowel function normalized on cholestyramine as well. Not mentioned above, she seems to be doing very well from her recent knee surgery.  I suggested she continue her current regimen. I plan to see her back in one year. If she has any interim problems she is to call.

## 2012-03-29 DIAGNOSIS — H5200 Hypermetropia, unspecified eye: Secondary | ICD-10-CM | POA: Diagnosis not present

## 2012-03-29 DIAGNOSIS — H52229 Regular astigmatism, unspecified eye: Secondary | ICD-10-CM | POA: Diagnosis not present

## 2012-03-29 DIAGNOSIS — E119 Type 2 diabetes mellitus without complications: Secondary | ICD-10-CM | POA: Diagnosis not present

## 2012-03-29 DIAGNOSIS — H524 Presbyopia: Secondary | ICD-10-CM | POA: Diagnosis not present

## 2012-04-03 DIAGNOSIS — E119 Type 2 diabetes mellitus without complications: Secondary | ICD-10-CM | POA: Diagnosis not present

## 2012-04-10 DIAGNOSIS — G43009 Migraine without aura, not intractable, without status migrainosus: Secondary | ICD-10-CM | POA: Diagnosis not present

## 2012-04-10 DIAGNOSIS — E119 Type 2 diabetes mellitus without complications: Secondary | ICD-10-CM | POA: Diagnosis not present

## 2012-04-10 DIAGNOSIS — I10 Essential (primary) hypertension: Secondary | ICD-10-CM | POA: Diagnosis not present

## 2012-04-10 DIAGNOSIS — Z23 Encounter for immunization: Secondary | ICD-10-CM | POA: Diagnosis not present

## 2012-06-06 DIAGNOSIS — G4700 Insomnia, unspecified: Secondary | ICD-10-CM | POA: Diagnosis not present

## 2012-06-19 ENCOUNTER — Encounter: Payer: Self-pay | Admitting: Pulmonary Disease

## 2012-06-20 ENCOUNTER — Ambulatory Visit (INDEPENDENT_AMBULATORY_CARE_PROVIDER_SITE_OTHER): Payer: Medicare Other | Admitting: Pulmonary Disease

## 2012-06-20 ENCOUNTER — Encounter: Payer: Self-pay | Admitting: Pulmonary Disease

## 2012-06-20 VITALS — BP 150/80 | HR 68 | Temp 97.7°F | Ht 60.0 in | Wt 119.8 lb

## 2012-06-20 DIAGNOSIS — G4721 Circadian rhythm sleep disorder, delayed sleep phase type: Secondary | ICD-10-CM | POA: Diagnosis not present

## 2012-06-20 DIAGNOSIS — G4700 Insomnia, unspecified: Secondary | ICD-10-CM | POA: Diagnosis not present

## 2012-06-20 DIAGNOSIS — F5104 Psychophysiologic insomnia: Secondary | ICD-10-CM

## 2012-06-20 NOTE — Patient Instructions (Addendum)
Work on ritualistic behaviors about one hour before bedtime ( something you like to do that is relaxing) Do not ever stay in bedroom unless you intend to lie down and go to sleep.  Especially during the day.  Go to bed between 10p-2am, and do not ever read or watch tv in bed.  If you cannot get to sleep in , leave bedroom and go to family room to read or watch tv.  Return to bedroom once you think you can fall asleep.  Repeat this as many time during the night until you fall asleep. You MUST get out of bed no later than 8am each day, and NO Napping during the day.   No caffeine after 10am (no tea, soft drinks, coffee unless totally caffeine free) Can try melatonin 3mg  about 3-4 hours before your planned bedtime (not at bedtime).   Please call me in 3 weeks to give me an update.  I expect slow progress over time.  Please be patient.

## 2012-06-20 NOTE — Assessment & Plan Note (Signed)
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  I have stressed to her the importance of getting up each morning later than 8 AM so that she can take advantage of the early morning light to reset her circadian rhythm.

## 2012-06-20 NOTE — Assessment & Plan Note (Signed)
The patient clearly has developed a lower and insomnia, and this is being propagated by poor sleep hygiene.  I have reviewed with her what constitutes good sleep hygiene, and have also discussed with her the concept of ritualistic behaviors and stimulus control therapy.  I have told her that she needs to convince her brain the bedroom is a place to sleep, and not to do anything else.  It is going to require difficult behavioral therapy on her part in order to improve.  I have also discussed with her the role of sleeping medications, and how this is not the answer.  If she continues to have issues with this, she may need a consult with a behavioral therapist for cognitive behavioral therapy (CBT).

## 2012-06-20 NOTE — Progress Notes (Signed)
Subjective:    Patient ID: Angela Galvan, female    DOB: [birth year], 79 y.o.   MRN: [id number]  HPI The patient is a 79 year old female who I've been asked to see for sleeping issues.  The patient states that she has had sleep onset issues for over the last 6 months, and is unsure why this has developed.  She will typically go to the bedroom around 10 PM, and will eat or watch TV or read.  When she finally turns out the light to go sleep, she will toss and turn and ultimately falls asleep somewhere between 2 and 5 AM.  When she falls asleep, she stays asleep until she awakens to start her day.  She will typically get anywhere between 11 and 12 noon, but sometimes sleeps longer.  She also can take a nap during the afternoon as well.  The patient drinks occasional cup of coffee, but drinks at least one caffeinated soda during the day and also tea at lunch and in the evening.  She denies having sleepiness during the day as long as she sleeps late.  She does not have any snoring, and is unsure if she kicks during the night.  She denies any restless leg syndrome symptoms.  Sleep Questionnaire: What time do you typically go to bed?( Between what hours) 9-10pm--difficulty falling asleep without medication....sometimes takes 2 Tylenol and some Nyquil to help her go to sleep. How long does it take you to fall asleep? 4-5 hours How many times during the night do you wake up? 3 What time do you get out of bed to start your day? 0900 Do you drive or operate heavy machinery in your occupation? No How much has your weight changed (up or down) over the past two years? (In pounds) 0 oz (0 kg) Have you ever had a sleep study before? No Do you currently use CPAP? No Do you wear oxygen at any time? No    Review of Systems  Constitutional: Negative for fever and unexpected weight change.  HENT: Positive for rhinorrhea and sneezing. Negative for ear pain, nosebleeds, congestion, sore throat, trouble swallowing, dental  problem, postnasal drip and sinus pressure.   Eyes: Negative for redness and itching.  Respiratory: Positive for cough. Negative for chest tightness, shortness of breath and wheezing.   Cardiovascular: Negative for palpitations and leg swelling.  Gastrointestinal: Negative for nausea and vomiting.       Indigestion  Genitourinary: Negative for dysuria.  Musculoskeletal: Negative for joint swelling.  Skin: Negative for rash.  Neurological: Positive for headaches.  Hematological: Does not bruise/bleed easily.  Psychiatric/Behavioral: Negative for dysphoric mood. The patient is nervous/anxious.        Objective:   Physical Exam Constitutional:  Well developed, no acute distress  HENT:  Nares patent without discharge  Oropharynx without exudate, palate and uvula are normal  Eyes:  Perrla, eomi, no scleral icterus  Neck:  No JVD, no TMG  Cardiovascular:  Normal rate, regular rhythm, no rubs or gallops.  No murmurs        Intact distal pulses  Pulmonary :  Normal breath sounds, no stridor or respiratory distress   No rales, rhonchi, or wheezing  Abdominal:  Soft, nondistended, bowel sounds present.  No tenderness noted.   Musculoskeletal:  No lower extremity edema noted.  Lymph Nodes:  No cervical lymphadenopathy noted  Skin:  No cyanosis noted  Neurologic:  Alert, appropriate, moves all 4 extremities without obvious deficit.  Assessment & Plan:

## 2012-07-02 DIAGNOSIS — H903 Sensorineural hearing loss, bilateral: Secondary | ICD-10-CM | POA: Diagnosis not present

## 2012-07-10 ENCOUNTER — Telehealth: Payer: Self-pay | Admitting: Pulmonary Disease

## 2012-07-10 NOTE — Telephone Encounter (Signed)
Fantastic.  Hopefully this will take care of things.  Let us know if problems recur.

## 2012-07-10 NOTE — Telephone Encounter (Signed)
I spoke with pt. She stated she is calling to let [facility] know how she was doing. She state she is going to bed about 10-11 PM everynight and is waking up early daily. She is taking the melatonin 3-4 hrs prior to bedtime, sleeping great through the night, feeling rested during the day and does not taking any naps during the day. This is FYI for [facility].

## 2012-07-25 DIAGNOSIS — E119 Type 2 diabetes mellitus without complications: Secondary | ICD-10-CM | POA: Diagnosis not present

## 2012-07-29 DIAGNOSIS — E119 Type 2 diabetes mellitus without complications: Secondary | ICD-10-CM | POA: Diagnosis not present

## 2012-08-06 DIAGNOSIS — E119 Type 2 diabetes mellitus without complications: Secondary | ICD-10-CM | POA: Diagnosis not present

## 2012-08-06 DIAGNOSIS — I10 Essential (primary) hypertension: Secondary | ICD-10-CM | POA: Diagnosis not present

## 2012-08-06 DIAGNOSIS — F329 Major depressive disorder, single episode, unspecified: Secondary | ICD-10-CM | POA: Diagnosis not present

## 2012-08-06 DIAGNOSIS — F3289 Other specified depressive episodes: Secondary | ICD-10-CM | POA: Diagnosis not present

## 2012-09-24 DIAGNOSIS — IMO0002 Reserved for concepts with insufficient information to code with codable children: Secondary | ICD-10-CM | POA: Diagnosis not present

## 2012-09-24 DIAGNOSIS — M1710 Unilateral primary osteoarthritis, unspecified knee: Secondary | ICD-10-CM | POA: Diagnosis not present

## 2012-10-01 ENCOUNTER — Other Ambulatory Visit (HOSPITAL_COMMUNITY): Payer: Self-pay | Admitting: Internal Medicine

## 2012-10-01 DIAGNOSIS — Z139 Encounter for screening, unspecified: Secondary | ICD-10-CM

## 2012-10-07 ENCOUNTER — Encounter: Payer: Self-pay | Admitting: Obstetrics & Gynecology

## 2012-10-07 ENCOUNTER — Ambulatory Visit (INDEPENDENT_AMBULATORY_CARE_PROVIDER_SITE_OTHER): Payer: Medicare Other | Admitting: Obstetrics & Gynecology

## 2012-10-07 VITALS — BP 150/70 | Ht 60.0 in | Wt 118.0 lb

## 2012-10-07 DIAGNOSIS — M25819 Other specified joint disorders, unspecified shoulder: Secondary | ICD-10-CM | POA: Diagnosis not present

## 2012-10-07 DIAGNOSIS — N61 Mastitis without abscess: Secondary | ICD-10-CM

## 2012-10-07 MED ORDER — DOXYCYCLINE HYCLATE 50 MG PO CAPS: 100.0000 mg | ORAL_CAPSULE | Freq: Two times a day (BID) | ORAL | Status: DC

## 2012-10-07 NOTE — Progress Notes (Signed)
Patient ID: Angela Galvan, female   DOB: May 24, 1933, 79 y.o.   MRN: [id number] Patient had some stinging and small blood at left nipple, got red inflammed  Exam Red tender no masses  Cellulitis of nipple  Doxycycline  X 10 days Mammogram scheduled 6.2014

## 2012-10-08 ENCOUNTER — Telehealth: Payer: Self-pay | Admitting: Obstetrics & Gynecology

## 2012-10-08 NOTE — Telephone Encounter (Signed)
Per Cyril Mourning, NP pt to eat with doxycycline, clarified for pt to take 2 capsules by mouth, 2 times daily. Pt verbalized understanding.

## 2012-11-04 DIAGNOSIS — M25519 Pain in unspecified shoulder: Secondary | ICD-10-CM | POA: Diagnosis not present

## 2012-11-07 ENCOUNTER — Other Ambulatory Visit (HOSPITAL_COMMUNITY): Payer: Self-pay | Admitting: Internal Medicine

## 2012-11-07 ENCOUNTER — Other Ambulatory Visit: Payer: Self-pay | Admitting: Urgent Care

## 2012-11-07 ENCOUNTER — Ambulatory Visit (HOSPITAL_COMMUNITY)
Admission: RE | Admit: 2012-11-07 | Discharge: 2012-11-07 | Disposition: A | Discharge status: Home / Self Care | Payer: Medicare Other | Source: Ambulatory Visit | Attending: Internal Medicine | Admitting: Internal Medicine

## 2012-11-07 DIAGNOSIS — N6452 Nipple discharge: Secondary | ICD-10-CM

## 2012-11-07 DIAGNOSIS — Z139 Encounter for screening, unspecified: Secondary | ICD-10-CM

## 2012-11-13 ENCOUNTER — Ambulatory Visit (HOSPITAL_COMMUNITY)
Admission: RE | Admit: 2012-11-13 | Discharge: 2012-11-13 | Disposition: A | Discharge status: Home / Self Care | Payer: Medicare Other | Source: Ambulatory Visit | Attending: Internal Medicine | Admitting: Internal Medicine

## 2012-11-13 DIAGNOSIS — N61 Mastitis without abscess: Secondary | ICD-10-CM | POA: Insufficient documentation

## 2012-11-13 DIAGNOSIS — N6452 Nipple discharge: Secondary | ICD-10-CM

## 2012-11-13 DIAGNOSIS — R928 Other abnormal and inconclusive findings on diagnostic imaging of breast: Secondary | ICD-10-CM | POA: Diagnosis not present

## 2012-11-28 DIAGNOSIS — N61 Mastitis without abscess: Secondary | ICD-10-CM | POA: Diagnosis not present

## 2012-12-02 DIAGNOSIS — I10 Essential (primary) hypertension: Secondary | ICD-10-CM | POA: Diagnosis not present

## 2012-12-02 DIAGNOSIS — Z79899 Other long term (current) drug therapy: Secondary | ICD-10-CM | POA: Diagnosis not present

## 2012-12-02 DIAGNOSIS — E039 Hypothyroidism, unspecified: Secondary | ICD-10-CM | POA: Diagnosis not present

## 2012-12-02 DIAGNOSIS — E119 Type 2 diabetes mellitus without complications: Secondary | ICD-10-CM | POA: Diagnosis not present

## 2012-12-09 DIAGNOSIS — Z1212 Encounter for screening for malignant neoplasm of rectum: Secondary | ICD-10-CM | POA: Diagnosis not present

## 2012-12-09 DIAGNOSIS — E119 Type 2 diabetes mellitus without complications: Secondary | ICD-10-CM | POA: Diagnosis not present

## 2012-12-09 DIAGNOSIS — E785 Hyperlipidemia, unspecified: Secondary | ICD-10-CM | POA: Diagnosis not present

## 2012-12-09 DIAGNOSIS — E039 Hypothyroidism, unspecified: Secondary | ICD-10-CM | POA: Diagnosis not present

## 2012-12-09 LAB — FECAL OCCULT BLOOD, GUAIAC: Fecal Occult Blood: NEGATIVE

## 2012-12-10 ENCOUNTER — Telehealth: Payer: Self-pay | Admitting: Internal Medicine

## 2012-12-10 MED ORDER — RABEPRAZOLE SODIUM 20 MG PO TBEC: DELAYED_RELEASE_TABLET | ORAL | Status: DC

## 2012-12-10 NOTE — Telephone Encounter (Signed)
Pt called to confirm her OV with [facility] ONLY on 7/29 and said she will run out of her Aciphex in a few days and could he call in a few to [place]..please advise

## 2012-12-10 NOTE — Telephone Encounter (Signed)
Routing to refill box  

## 2012-12-10 NOTE — Telephone Encounter (Signed)
Done

## 2012-12-20 ENCOUNTER — Ambulatory Visit: Payer: Medicare Other | Admitting: Internal Medicine

## 2012-12-24 ENCOUNTER — Ambulatory Visit: Payer: Medicare Other | Admitting: Internal Medicine

## 2013-01-01 DIAGNOSIS — M25519 Pain in unspecified shoulder: Secondary | ICD-10-CM | POA: Diagnosis not present

## 2013-01-02 DIAGNOSIS — N61 Mastitis without abscess: Secondary | ICD-10-CM | POA: Diagnosis not present

## 2013-01-02 NOTE — H&P (Signed)
  NTS SOAP Note  Vital Signs:  Vitals as of: 01/02/2013: Systolic 141: Diastolic 64: Heart Rate 73: Temp 98.8F: Height 21ft 0in: Weight 117Lbs 0 Ounces: BMI 22.85  BMI : 22.85 kg/m2  Subjective: This 79 Years Female presents for of left nipple skin irritation. Has been present since 5/14.  Was treated with doxycycline once in past.  Denies drainage at this point.  Does have a h/o mastitis in remote past.  Mammogram done which was negative for malignancy.  No family h/o breast cancer.  Review of Symptoms:  Constitutional:unremarkable   Head:unremarkable    Eyes:unremarkable   Nose/Mouth/Throat:unremarkable Cardiovascular:  unremarkable   Respiratory:unremarkable   Gastrointestinal:  unremarkable   Genitourinary:unremarkable       back pain Skin:unremarkable   as above Hematolgic/Lymphatic:unremarkable     Allergic/Immunologic:unremarkable     Past Medical History:    Reviewed   Past Medical History  Surgical History: TAH, right oophorectomy for tumor in 1950's, right knee replacement, left knee replacement, cholecystectomy Medical Problems:  High Blood pressure, High cholesterol, Hypothyroidism Allergies: nkda Medications: synthroid, aciphex, citalopram, lorstatin, atorvastatin, asa, cholestyramine   Social History:Reviewed  Social History  Preferred Language: English Race:  White Ethnicity: Not Hispanic / Latino Age: 79 Years 5 Months Marital Status:  S Alcohol:  No Recreational drug(s):  No   Smoking Status: Never smoker reviewed on 11/28/2012 Functional Status reviewed on mm/dd/yyyy ------------------------------------------------ Bathing: Normal Cooking: Normal Dressing: Normal Driving: Normal Eating: Normal Managing Meds: Normal Oral Care: Normal Shopping: Normal Toileting: Normal Transferring: Normal Walking: Normal Cognitive Status reviewed on  mm/dd/yyyy ------------------------------------------------ Attention: Normal Decision Making: Normal Language: Normal Memory: Normal Motor: Normal Perception: Normal Problem Solving: Normal Visual and Spatial: Normal   Family History:  Reviewed  Family Health History Mother, Deceased; Healthy;  Father, Deceased; Healthy;     Objective Information: General:  Well appearing, well nourished in no distress. Neck:  Supple without lymphadenopathy.  Heart:  RRR, no murmur Lungs:    CTA bilaterally, no wheezes, rhonchi, rales.  Breathing unlabored.     No dominant mass in either breast.  Small area of superficial skin excoriation on tip of left nipple.  Left areola otherwise within normal limits.  No drainage noted.  Axillas negative for palpable nodes.  Assessment:left nipple cellulitis, possible Paget's disease  Diagnosis &amp; Procedure Smart Code   Plan:Scheduled for left breast biopsy on 01/10/13.

## 2013-01-03 ENCOUNTER — Ambulatory Visit (INDEPENDENT_AMBULATORY_CARE_PROVIDER_SITE_OTHER): Payer: Medicare Other | Admitting: Internal Medicine

## 2013-01-03 ENCOUNTER — Encounter: Payer: Self-pay | Admitting: Internal Medicine

## 2013-01-03 VITALS — BP 140/60 | HR 63 | Temp 97.5°F | Ht 60.0 in | Wt 117.4 lb

## 2013-01-03 DIAGNOSIS — R197 Diarrhea, unspecified: Secondary | ICD-10-CM | POA: Diagnosis not present

## 2013-01-03 DIAGNOSIS — K219 Gastro-esophageal reflux disease without esophagitis: Secondary | ICD-10-CM | POA: Diagnosis not present

## 2013-01-03 MED ORDER — RABEPRAZOLE SODIUM 20 MG PO TBEC: 20.0000 mg | DELAYED_RELEASE_TABLET | Freq: Every day | ORAL | Status: DC

## 2013-01-03 NOTE — Patient Instructions (Addendum)
Your procedure is scheduled on: 01/10/2013  Report to [facility] at  6:15   AM.  Call this number if you have problems the morning of surgery: [phone]   Remember:   Do not drink or eat food:After Midnight.  :  Take these medicines the morning of surgery with A SIP OF WATER: Losartan, Synthroid, xanax   Do not wear jewelry, make-up or nail polish.  Do not wear lotions, powders, or perfumes. You may wear deodorant.  Do not shave 48 hours prior to surgery. Men may shave face and neck.  Do not bring valuables to the hospital.  Contacts, dentures or bridgework may not be worn into surgery.  Leave suitcase in the car. After surgery it may be brought to your room.  For patients admitted to the hospital, checkout time is 11:00 AM the day of discharge.   Patients discharged the day of surgery will not be allowed to drive home.    Special Instructions: Shower using CHG 2 nights before surgery and the night before surgery.  If you shower the day of surgery use CHG.  Use special wash - you have one bottle of CHG for all showers.  You should use approximately 1/3 of the bottle for each shower.   Please read over the following fact sheets that you were given: Pain Booklet, MRSA Information, Surgical Site Infection Prevention and Care and Recovery After Surgery  Breast Biopsy Care After These instructions give you information on caring for yourself after your procedure. Your doctor may also give you more specific instructions. Call your doctor if you have any problems or questions after your procedure. HOME CARE  Only take medicine as told by your doctor.  Do not take aspirin.  Keep your sutures (stitches) dry when bathing.  Protect the biopsy area. Do not let the area get bumped.  Avoid activities that could pull the biopsy site open until your doctor approves. This includes:  Stretching.  Reaching.  Exercise.  Sports.  Lifting more than 3lb.  Continue your normal diet.  Wear a  good support bra for as long as told by your doctor.  Change any bandages (dressings) as told by your doctor.  Do not drink alcohol while taking pain medicine.  Keep all doctor visits as told. Ask when your test results will be ready. Make sure you get your test results. GET HELP RIGHT AWAY IF:   You have a fever.  You have more bleeding (more than a small spot) from the biopsy site.  You have trouble breathing.  You have yellowish-white fluid (pus) coming from the biopsy site.  You have redness, puffiness (swelling), or more pain in the biopsy site.  You have a bad smell coming from the biopsy site.  Your biopsy site opens after sutures, staples, or sticky strips have been removed.  You have a rash.  You need stronger medicine. MAKE SURE YOU:  Understand these instructions.  Will watch your condition.  Will get help right away if you are not doing well or get worse. Document Released: 03/11/2009 Document Revised: 08/07/2011 Document Reviewed: 06/25/2011 [facility] Patient Information 2014 [place], [place]. PATIENT INSTRUCTIONS POST-ANESTHESIA  IMMEDIATELY FOLLOWING SURGERY:  Do not drive or operate machinery for the first twenty four hours after surgery.  Do not make any important decisions for twenty four hours after surgery or while taking narcotic pain medications or sedatives.  If you develop intractable nausea and vomiting or a severe headache please notify your doctor immediately.  FOLLOW-UP:  Please make an appointment with your surgeon as instructed. You do not need to follow up with anesthesia unless specifically instructed to do so.  WOUND CARE INSTRUCTIONS (if applicable):  Keep a dry clean dressing on the anesthesia/puncture wound site if there is drainage.  Once the wound has quit draining you may leave it open to air.  Generally you should leave the bandage intact for twenty four hours unless there is drainage.  If the epidural site drains for more than 36-48  hours please call the anesthesia department.  QUESTIONS?:  Please feel free to call your physician or the hospital operator if you have any questions, and they will be happy to assist you.

## 2013-01-03 NOTE — Progress Notes (Signed)
Primary Care Physician:  [name], MD Primary Gastroenterologist:  Dr. Jena Gauss  Pre-Procedure History & Physical: HPI:  Angela Galvan is a 79 y.o. female here for followup of GERD and diarrhea. States she has probable diarrhea earlier in the year, temporally associated with antibiotics for breast lesion. Diarrhea settled down on 1-2 teaspoons a cholestyramine daily. She has one bowel movement daily. Reflux symptoms continue to be well controlled on AcipHex 20 mg daily. Last colonoscopy 2013 demonstrated pancolonic diverticulosis. Biopsies negative for microscopic colitis previously. Planning to have an excisional biopsy of breast lesion in the near future.  Past Medical History  Diagnosis Date  . Diverticulitis     s/p colectomy in 1991  . IBS (irritable bowel syndrome)   . HTN (hypertension)   . Hyperlipidemia   . S/P colonoscopy 2003    minimal internal hemorrhoids, pancolonic diverticula, biopsy of rectum: lymphoplasmacytic microscopic colitis, colon biopsies normal  . S/P colonoscopy 2012    pancolonic diverticula, nl TI, external hemorrhoidal tag,/rectum bx no abnormalities  . S/P endoscopy 2012    normal esophagus, stenotic pyloric channel, TTS dilation  . Diarrhea   . GERD (gastroesophageal reflux disease)   . Arthritis   . Hypothyroidism   . Diabetes mellitus     no meds.-diet controlled  . Stroke 08/10/2006    no residual    Past Surgical History  Procedure Laterality Date  . Colectomy    . Cholecystectomy    . Right ovarian tumor removed as teenager    . Abdominal hysterectomy    . Knee surgery    . Bilateral cataracts    . Esophagogastroduodenoscopy  10/11/2010    Normal esophagus, small hiatal hernia,Stenotic pyloric channel likely critical.  Likely contributing to some of the patient's symptoms, status post TTS balloon dilation Normal D1-D3 status post biopsy.  D2-D3 atrophic-appearing gastric mucosa status post biopsy  . Ileocolonoscopy  10/11/2010    external  hemorrhoidal tag.  Normal rectum status post biopsy Pancolonic diverticula status post segmental biopsy.  Normal  terminal ileum  . Colonoscopy  09/11/01    Minimal internal hemorrhoids, otherwise normal rectum . Scattered pancolonic diverticula (few) The colonic mucosa otherwise appeared normal status post biopsies and stool sampling as described above.  . Total knee arthroplasty  09/25/2011    Procedure: TOTAL KNEE ARTHROPLASTY;  Surgeon: [name], MD;  Location: [facility];  Service: Orthopedics;  Laterality: Left;    Prior to Admission medications   Medication Sig Start Date End Date Taking? Authorizing Provider  acetaminophen (TYLENOL) 500 MG tablet Take 500-1,000 mg by mouth every 6 (six) hours as needed. Pain    Yes Historical Provider, MD  ALPRAZolam (XANAX) 0.25 MG tablet Take 0.25 mg by mouth 2 (two) times daily as needed. Anxiety    Yes Historical Provider, MD  aspirin 325 MG tablet Take 325 mg by mouth daily.   Yes Historical Provider, MD  atorvastatin (LIPITOR) 10 MG tablet Take 10 mg by mouth daily after breakfast.   Yes Historical Provider, MD  cholestyramine (QUESTRAN) 4 GM/DOSE powder Take 1 to 2 grams twice daily as directed. 09/19/11  Yes Tiffany Kocher, PA-C  citalopram (CELEXA) 20 MG tablet Take 20 mg by mouth daily after breakfast.   Yes Historical Provider, MD  levothyroxine (SYNTHROID, LEVOTHROID) 50 MCG tablet Take 50 mcg by mouth daily before breakfast.    Yes Historical Provider, MD  losartan (COZAAR) 100 MG tablet Take 50 mg by mouth daily after breakfast.  Yes Historical Provider, MD  RABEprazole (ACIPHEX) 20 MG tablet TAKE ONE TABLET DAILY. 12/10/12  Yes Nira Retort, NP  RESTASIS 0.05 % ophthalmic emulsion Place 1 drop into both eyes every 12 (twelve) hours.  01/11/11  Yes Historical Provider, MD  doxycycline (VIBRAMYCIN) 50 MG capsule Take 2 capsules (100 mg total) by mouth 2 (two) times daily. 10/07/12   Lazaro Arms, MD  RABEprazole (ACIPHEX) 20 MG tablet Take  1 tablet (20 mg total) by mouth daily. 01/03/13   Corbin Ade, MD  traMADol (ULTRAM) 50 MG tablet Take 50 mg by mouth every 6 (six) hours as needed. Pain     Historical Provider, MD  traZODone (DESYREL) 50 MG tablet Take 50-100 mg by mouth at bedtime as needed. Sleep  05/18/11   Historical Provider, MD    Allergies as of 01/03/2013  . (No Known Allergies)    Family History  Problem Relation Age of Onset  . Colon cancer Neg Hx   . Stroke Maternal Grandmother     deceased after 3 strokes  . Stroke Mother     deceased after 3 strokes 22-Oct-1999  . Stroke Sister     twin sister  . Diabetes Father     History   Social History  . Marital Status: Married    Spouse Name: N/A    Number of Children: N/A  . Years of Education: N/A   Occupational History  . retired    Social History Main Topics  . Smoking status: Never Smoker   . Smokeless tobacco: Not on file  . Alcohol Use: Yes     Comment: occassional wine  . Drug Use: No  . Sexually Active: No   Other Topics Concern  . Not on file   Social History Narrative  . No narrative on file    Review of Systems: See HPI, otherwise negative ROS  Physical Exam: BP 140/60  Pulse 63  Temp(Src) 97.5 F (36.4 C) (Oral)  Ht 5' (1.524 m)  Wt 117 lb 6.4 oz (53.252 kg)  BMI 22.93 kg/m2 General:   Alert,  Well-developed, well-nourished, pleasant and cooperative in NAD Skin:  Intact without significant lesions or rashes. Eyes:  Sclera clear, no icterus.   Conjunctiva pink. Ears:  Normal auditory acuity. Nose:  No deformity, discharge,  or lesions. Mouth:  No deformity or lesions. Neck:  Supple; no masses or thyromegaly. No significant cervical adenopathy. Lungs:  Clear throughout to auscultation.   No wheezes, crackles, or rhonchi. No acute distress. Heart:  Regular rate and rhythm; no murmurs, clicks, rubs,  or gallops. Abdomen: Non-distended, normal bowel sounds.  Soft and nontender without appreciable mass or hepatosplenomegaly.   Pulses:  Normal pulses noted. Extremities:  Without clubbing or edema.  Impression/Plan:  Pleasant 79 year old lady with long-standing GERD now well controlled with AcipHex. We talked about the benefits and risk of long-term acid suppression therapy. In this situation, the benefits far outweigh the risks. 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 Doing nicely with resin binder therapy.   Recommendations :        Continue Questran. Admonished not to take it within 2 hours of other medications. Continue AcipHex 20 mg daily. Refill prescription x1 year. Office visit in one year and when necessary

## 2013-01-03 NOTE — Patient Instructions (Signed)
Continue Aciphex daily (benefits outweigh the risks)  Continue Questran 1 - 2 teaspoons daily  Office visit in 1 year

## 2013-01-06 ENCOUNTER — Other Ambulatory Visit: Payer: Self-pay

## 2013-01-06 ENCOUNTER — Encounter (HOSPITAL_COMMUNITY): Payer: Self-pay | Admitting: Pharmacy Technician

## 2013-01-06 ENCOUNTER — Encounter (HOSPITAL_COMMUNITY): Payer: Self-pay

## 2013-01-06 ENCOUNTER — Encounter (HOSPITAL_COMMUNITY)
Admission: RE | Admit: 2013-01-06 | Discharge: 2013-01-06 | Disposition: A | Discharge status: Home / Self Care | Payer: Medicare Other | Source: Ambulatory Visit | Attending: General Surgery | Admitting: General Surgery

## 2013-01-06 DIAGNOSIS — Z01810 Encounter for preprocedural cardiovascular examination: Secondary | ICD-10-CM | POA: Diagnosis not present

## 2013-01-06 DIAGNOSIS — Z01818 Encounter for other preprocedural examination: Secondary | ICD-10-CM | POA: Insufficient documentation

## 2013-01-06 DIAGNOSIS — M25519 Pain in unspecified shoulder: Secondary | ICD-10-CM | POA: Diagnosis not present

## 2013-01-06 DIAGNOSIS — Z01812 Encounter for preprocedural laboratory examination: Secondary | ICD-10-CM | POA: Diagnosis not present

## 2013-01-06 HISTORY — DX: Unspecified hearing loss, unspecified ear: H91.90

## 2013-01-06 HISTORY — DX: Anxiety disorder, unspecified: F41.9

## 2013-01-06 LAB — CBC WITH DIFFERENTIAL/PLATELET
Basophils Absolute: 0.1 10*3/uL (ref 0.0–0.1)
Eosinophils Relative: 1 % (ref 0–5)
HCT: 39.1 % (ref 36.0–46.0)
Hemoglobin: 12.9 g/dL (ref 12.0–15.0)
Lymphocytes Relative: 39 % (ref 12–46)
Lymphs Abs: 3 10*3/uL (ref 0.7–4.0)
MCV: 89.9 fL (ref 78.0–100.0)
Monocytes Absolute: 0.7 10*3/uL (ref 0.1–1.0)
Monocytes Relative: 9 % (ref 3–12)
Neutro Abs: 3.9 10*3/uL (ref 1.7–7.7)
RBC: 4.35 MIL/uL (ref 3.87–5.11)
WBC: 7.8 10*3/uL (ref 4.0–10.5)

## 2013-01-06 LAB — BASIC METABOLIC PANEL
CO2: 27 mEq/L (ref 19–32)
Chloride: 103 mEq/L (ref 96–112)
Creatinine, Ser: 0.97 mg/dL (ref 0.50–1.10)
GFR calc Af Amer: 63 mL/min — ABNORMAL LOW (ref >90)
Potassium: 4.6 mEq/L (ref 3.5–5.1)

## 2013-01-10 ENCOUNTER — Encounter (HOSPITAL_COMMUNITY)
Admission: RE | Disposition: A | Discharge status: Home / Self Care | Payer: Self-pay | Source: Ambulatory Visit | Attending: General Surgery

## 2013-01-10 ENCOUNTER — Encounter (HOSPITAL_COMMUNITY): Payer: Self-pay | Admitting: Anesthesiology

## 2013-01-10 ENCOUNTER — Encounter (HOSPITAL_COMMUNITY): Payer: Self-pay | Admitting: *Deleted

## 2013-01-10 ENCOUNTER — Ambulatory Visit (HOSPITAL_COMMUNITY): Payer: Medicare Other | Admitting: Anesthesiology

## 2013-01-10 ENCOUNTER — Ambulatory Visit (HOSPITAL_COMMUNITY)
Admission: RE | Admit: 2013-01-10 | Discharge: 2013-01-10 | Disposition: A | Discharge status: Home / Self Care | Payer: Medicare Other | Source: Ambulatory Visit | Attending: General Surgery | Admitting: General Surgery

## 2013-01-10 DIAGNOSIS — C50919 Malignant neoplasm of unspecified site of unspecified female breast: Secondary | ICD-10-CM | POA: Insufficient documentation

## 2013-01-10 DIAGNOSIS — N61 Mastitis without abscess: Secondary | ICD-10-CM | POA: Diagnosis not present

## 2013-01-10 DIAGNOSIS — Z01812 Encounter for preprocedural laboratory examination: Secondary | ICD-10-CM | POA: Diagnosis not present

## 2013-01-10 DIAGNOSIS — D0590 Unspecified type of carcinoma in situ of unspecified breast: Secondary | ICD-10-CM | POA: Diagnosis not present

## 2013-01-10 DIAGNOSIS — E119 Type 2 diabetes mellitus without complications: Secondary | ICD-10-CM | POA: Diagnosis not present

## 2013-01-10 DIAGNOSIS — C50019 Malignant neoplasm of nipple and areola, unspecified female breast: Secondary | ICD-10-CM | POA: Diagnosis not present

## 2013-01-10 DIAGNOSIS — I10 Essential (primary) hypertension: Secondary | ICD-10-CM | POA: Diagnosis not present

## 2013-01-10 DIAGNOSIS — D4860 Neoplasm of uncertain behavior of unspecified breast: Secondary | ICD-10-CM | POA: Diagnosis not present

## 2013-01-10 HISTORY — PX: BREAST BIOPSY: SHX20

## 2013-01-10 SURGERY — BREAST BIOPSY
Anesthesia: General | Site: Breast | Laterality: Left | Wound class: Clean

## 2013-01-10 MED ORDER — CEFAZOLIN SODIUM-DEXTROSE 2-3 GM-% IV SOLR
INTRAVENOUS | Status: AC
  Filled 2013-01-10: qty 50

## 2013-01-10 MED ORDER — FENTANYL CITRATE 0.05 MG/ML IJ SOLN: 25.0000 ug | INTRAMUSCULAR | Status: DC | PRN

## 2013-01-10 MED ORDER — BUPIVACAINE HCL (PF) 0.5 % IJ SOLN
INTRAMUSCULAR | Status: AC
  Filled 2013-01-10: qty 30

## 2013-01-10 MED ORDER — BUPIVACAINE HCL (PF) 0.5 % IJ SOLN
INTRAMUSCULAR | Status: DC | PRN
  Administered 2013-01-10: 5 mL

## 2013-01-10 MED ORDER — FENTANYL CITRATE 0.05 MG/ML IJ SOLN
INTRAMUSCULAR | Status: AC
  Filled 2013-01-10: qty 5

## 2013-01-10 MED ORDER — LIDOCAINE HCL (CARDIAC) 20 MG/ML IV SOLN
INTRAVENOUS | Status: DC | PRN
  Administered 2013-01-10: 30 mg via INTRAVENOUS

## 2013-01-10 MED ORDER — 0.9 % SODIUM CHLORIDE (POUR BTL) OPTIME
TOPICAL | Status: DC | PRN
  Administered 2013-01-10: 1000 mL

## 2013-01-10 MED ORDER — MIDAZOLAM HCL 2 MG/2ML IJ SOLN
INTRAMUSCULAR | Status: AC
  Filled 2013-01-10: qty 2

## 2013-01-10 MED ORDER — ONDANSETRON HCL 4 MG/2ML IJ SOLN
4.0000 mg | Freq: Once | INTRAMUSCULAR | Status: AC
  Administered 2013-01-10: 4 mg via INTRAVENOUS

## 2013-01-10 MED ORDER — FENTANYL CITRATE 0.05 MG/ML IJ SOLN
INTRAMUSCULAR | Status: DC | PRN
  Administered 2013-01-10: 50 ug via INTRAVENOUS

## 2013-01-10 MED ORDER — MIDAZOLAM HCL 2 MG/2ML IJ SOLN
1.0000 mg | INTRAMUSCULAR | Status: DC | PRN
  Administered 2013-01-10: 2 mg via INTRAVENOUS

## 2013-01-10 MED ORDER — ONDANSETRON HCL 4 MG/2ML IJ SOLN
INTRAMUSCULAR | Status: AC
  Filled 2013-01-10: qty 2

## 2013-01-10 MED ORDER — LACTATED RINGERS IV SOLN
INTRAVENOUS | Status: DC
  Administered 2013-01-10: 1000 mL via INTRAVENOUS

## 2013-01-10 MED ORDER — PROPOFOL 10 MG/ML IV EMUL
INTRAVENOUS | Status: AC
  Filled 2013-01-10: qty 20

## 2013-01-10 MED ORDER — LIDOCAINE HCL (PF) 1 % IJ SOLN
INTRAMUSCULAR | Status: AC
  Filled 2013-01-10: qty 5

## 2013-01-10 MED ORDER — KETOROLAC TROMETHAMINE 30 MG/ML IJ SOLN
30.0000 mg | Freq: Once | INTRAMUSCULAR | Status: AC
  Administered 2013-01-10: 30 mg via INTRAVENOUS

## 2013-01-10 MED ORDER — ONDANSETRON HCL 4 MG/2ML IJ SOLN: 4.0000 mg | Freq: Once | INTRAMUSCULAR | Status: DC | PRN

## 2013-01-10 MED ORDER — KETOROLAC TROMETHAMINE 30 MG/ML IJ SOLN
INTRAMUSCULAR | Status: AC
  Filled 2013-01-10: qty 1

## 2013-01-10 MED ORDER — CEFAZOLIN SODIUM-DEXTROSE 2-3 GM-% IV SOLR
2.0000 g | INTRAVENOUS | Status: AC
  Administered 2013-01-10: 2 g via INTRAVENOUS

## 2013-01-10 MED ORDER — PROPOFOL 10 MG/ML IV BOLUS
INTRAVENOUS | Status: DC | PRN
  Administered 2013-01-10: 100 mg via INTRAVENOUS

## 2013-01-10 MED ORDER — CHLORHEXIDINE GLUCONATE 4 % EX LIQD: 1.0000 "application " | Freq: Once | CUTANEOUS | Status: DC

## 2013-01-10 SURGICAL SUPPLY — 31 items
BAG HAMPER (MISCELLANEOUS) ×2 IMPLANT
CLOTH BEACON ORANGE TIMEOUT ST (SAFETY) ×2 IMPLANT
COVER LIGHT HANDLE STERIS (MISCELLANEOUS) ×4 IMPLANT
DERMABOND ADVANCED (GAUZE/BANDAGES/DRESSINGS) ×1
DERMABOND ADVANCED .7 DNX12 (GAUZE/BANDAGES/DRESSINGS) ×1 IMPLANT
DURAPREP 26ML APPLICATOR (WOUND CARE) ×2 IMPLANT
ELECT REM PT RETURN 9FT ADLT (ELECTROSURGICAL) ×2
ELECTRODE REM PT RTRN 9FT ADLT (ELECTROSURGICAL) ×1 IMPLANT
FORMALIN 10 PREFIL 120ML (MISCELLANEOUS) ×2 IMPLANT
GLOVE BIO SURGEON STRL SZ7.5 (GLOVE) ×2 IMPLANT
GLOVE ECLIPSE 6.5 STRL STRAW (GLOVE) ×2 IMPLANT
GLOVE EXAM NITRILE MD LF STRL (GLOVE) ×2 IMPLANT
GLOVE INDICATOR 7.0 STRL GRN (GLOVE) ×2 IMPLANT
GOWN STRL REIN XL XLG (GOWN DISPOSABLE) ×4 IMPLANT
KIT ROOM TURNOVER APOR (KITS) ×2 IMPLANT
MANIFOLD NEPTUNE II (INSTRUMENTS) ×2 IMPLANT
NEEDLE HYPO 18GX1.5 BLUNT FILL (NEEDLE) ×2 IMPLANT
NEEDLE HYPO 25X1 1.5 SAFETY (NEEDLE) ×2 IMPLANT
NS IRRIG 1000ML POUR BTL (IV SOLUTION) ×2 IMPLANT
PACK MINOR (CUSTOM PROCEDURE TRAY) ×2 IMPLANT
PAD ARMBOARD 7.5X6 YLW CONV (MISCELLANEOUS) ×2 IMPLANT
SET BASIN LINEN APH (SET/KITS/TRAYS/PACK) ×2 IMPLANT
SPONGE GAUZE 2X2 8PLY STRL LF (GAUZE/BANDAGES/DRESSINGS) IMPLANT
STRIP CLOSURE SKIN 1/4X3 (GAUZE/BANDAGES/DRESSINGS) IMPLANT
SUT SILK 2 0 SH (SUTURE) IMPLANT
SUT VIC AB 3-0 SH 27 (SUTURE) ×1
SUT VIC AB 3-0 SH 27X BRD (SUTURE) ×1 IMPLANT
SUT VIC AB 4-0 PS2 27 (SUTURE) ×2 IMPLANT
SUT VIC AB 5-0 P-3 18X BRD (SUTURE) ×1 IMPLANT
SUT VIC AB 5-0 P3 18 (SUTURE) ×1
SYR CONTROL 10ML LL (SYRINGE) ×2 IMPLANT

## 2013-01-10 NOTE — Interval H&P Note (Signed)
History and Physical Interval Note:  01/10/2013 7:24 AM  Angela Galvan  has presented today for surgery, with the diagnosis of paget's disease left breast  The various methods of treatment have been discussed with the patient and family. After consideration of risks, benefits and other options for treatment, the patient has consented to  Procedure(s): BREAST BIOPSY (Left) as a surgical intervention .  The patient's history has been reviewed, patient examined, no change in status, stable for surgery.  I have reviewed the patient's chart and labs.  Questions were answered to the patient's satisfaction.     Franky Macho A

## 2013-01-10 NOTE — Progress Notes (Signed)
Spoke to Dr. Lovell Sheehan about patient taking her Aspirin 325mg . He stated that she could start her medication back on Saturday, August 16. No further instructions given.

## 2013-01-10 NOTE — Op Note (Signed)
Patient:  Kailen B Geers  DOB:  20-Dec-1932  MRN:  [id number]   Preop Diagnosis:  Left nipple lesion  Postop Diagnosis:  Same  Procedure:  Left breast biopsy  Surgeon:  Franky Macho, M.D.  Anes:  General  Indications:  Patient is a 79 year old white female presents with an abnormal excoriation of the left nipple. A left breast biopsy is being performed to rule out Paget's disease. The risks and benefits of the procedure were fully explained to the patient, who gave informed consent.  Procedure note:  The patient is placed the supine position. After general anesthesia was administered, the left breast was prepped and draped using usual sterile technique with DuraPrep. Surgical site confirmation was performed.  A small excoriation was noted on the inferior aspect of left nipple. An elliptical incision was made in a full-thickness biopsy was performed, including the underlying breast tissue. The specimen was sent to pathology further examination. 0.5% Sensorcaine was instilled the surrounding wound. The incision was closed using a 5-0 Vicryl subcuticular suture. Dermabond was then applied.  All tape and needle counts were correct at the end of the procedure. Patient was awakened and transferred to PACU in stable condition.  Complications:  None  EBL:  None  Specimen:  Left breast/nipple

## 2013-01-10 NOTE — Anesthesia Postprocedure Evaluation (Signed)
  Anesthesia Post-op Note  Patient: Angela Galvan  Procedure(s) Performed: Procedure(s): BREAST BIOPSY (Left)  Patient Location: PACU  Anesthesia Type:General  Level of Consciousness: awake, alert  and oriented  Airway and Oxygen Therapy: Patient Spontanous Breathing and Patient connected to face mask oxygen  Post-op Pain: none  Post-op Assessment: Post-op Vital signs reviewed, Patient's Cardiovascular Status Stable, Respiratory Function Stable, Patent Airway and No signs of Nausea or vomiting  Post-op Vital Signs: Reviewed and stable  Complications: No apparent anesthesia complications

## 2013-01-10 NOTE — Anesthesia Preprocedure Evaluation (Addendum)
Anesthesia Evaluation  Patient identified by MRN, date of birth, ID band Patient awake    Reviewed: Allergy & Precautions, H&P , NPO status , Patient's Chart, lab work & pertinent test results  Airway Mallampati: II TM Distance: >3 FB Neck ROM: Full    Dental no notable dental hx. (+) Teeth Intact   Pulmonary  CXR: No active disease. breath sounds clear to auscultation  Pulmonary exam normal       Cardiovascular Exercise Tolerance: Good hypertension, Pt. on medications Rhythm:Regular Rate:Normal     Neuro/Psych Leg weakness following stroke 2008. Both legs are weak. H/O lumbago. CVA, No Residual Symptoms negative psych ROS   GI/Hepatic Neg liver ROS, GERD- (inactive)  Medicated and Controlled,  Endo/Other  diabetes, Type 2, Oral Hypoglycemic AgentsHypothyroidism   Renal/GU negative Renal ROS  negative genitourinary   Musculoskeletal negative musculoskeletal ROS (+)   Abdominal   Peds negative pediatric ROS (+)  Hematology negative hematology ROS (+)   Anesthesia Other Findings   Reproductive/Obstetrics negative OB ROS                          Anesthesia Physical Anesthesia Plan  ASA: III  Anesthesia Plan: General   Post-op Pain Management:    Induction: Intravenous  Airway Management Planned: LMA  Additional Equipment:   Intra-op Plan:   Post-operative Plan: Extubation in OR  Informed Consent:   Plan Discussed with:   Anesthesia Plan Comments:         Anesthesia Quick Evaluation

## 2013-01-10 NOTE — Transfer of Care (Signed)
Immediate Anesthesia Transfer of Care Note  Patient: Angela Galvan  Procedure(s) Performed: Procedure(s): BREAST BIOPSY (Left)  Patient Location: PACU  Anesthesia Type:General  Level of Consciousness: awake, alert  and oriented  Airway & Oxygen Therapy: Patient Spontanous Breathing and Patient connected to face mask oxygen  Post-op Assessment: Report given to PACU RN  Post vital signs: Reviewed and stable  Complications: No apparent anesthesia complications

## 2013-01-10 NOTE — Anesthesia Procedure Notes (Signed)
Procedure Name: LMA Insertion Date/Time: 01/10/2013 7:44 AM Performed by: Glynn Octave E Pre-anesthesia Checklist: Patient identified, Patient being monitored, Emergency Drugs available, Timeout performed and Suction available Patient Re-evaluated:Patient Re-evaluated prior to inductionOxygen Delivery Method: Circle System Utilized Preoxygenation: Pre-oxygenation with 100% oxygen Intubation Type: IV induction Ventilation: Mask ventilation without difficulty LMA: LMA inserted LMA Size: 3.0 Number of attempts: 1 Placement Confirmation: positive ETCO2 and breath sounds checked- equal and bilateral

## 2013-01-13 ENCOUNTER — Encounter (HOSPITAL_COMMUNITY): Payer: Self-pay | Admitting: General Surgery

## 2013-01-24 ENCOUNTER — Ambulatory Visit (INDEPENDENT_AMBULATORY_CARE_PROVIDER_SITE_OTHER): Payer: Medicare Other | Admitting: General Surgery

## 2013-01-24 ENCOUNTER — Encounter (INDEPENDENT_AMBULATORY_CARE_PROVIDER_SITE_OTHER): Payer: Self-pay | Admitting: General Surgery

## 2013-01-24 VITALS — BP 130/78 | HR 68 | Temp 98.4°F | Resp 14 | Ht 60.0 in | Wt 116.0 lb

## 2013-01-24 DIAGNOSIS — C50919 Malignant neoplasm of unspecified site of unspecified female breast: Secondary | ICD-10-CM

## 2013-01-24 DIAGNOSIS — C50012 Malignant neoplasm of nipple and areola, left female breast: Secondary | ICD-10-CM | POA: Insufficient documentation

## 2013-01-24 NOTE — Patient Instructions (Signed)
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 The tumor is hormone receptor negative.  We have discussed your options which include left mastectomy and sentinel lymph biopsy. Alternatively we can consider a partial mastectomy removing the entire nipple and areola and  preserving the majority of your breast. You would probably need radiation therapy after that.  There is a slightly increased risk of local recurrence following lumpectomy, but there is no apparent survival advantage to mastectomy.  Whether you have a mastectomy or lumpectomy is your personal choice.  Most likely you will not be offered antiestrogen therapy by the medical oncologist.  A third option would be to get a medical oncology opinion preoperatively.  At this point in time, you should return to Dr. Lovell Sheehan for final surgical treatment planning.

## 2013-01-24 NOTE — Progress Notes (Addendum)
Patient ID: Angela Galvan, female   DOB: 10-31-1932, 79 y.o.   MRN: [id number]  Chief Complaint  Patient presents with  . New Evaluation    evakl br cancer left    HPI Angela Galvan is a 79 y.o. female.  She is referred by Dr. 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   She has never had a breast problem in the past. She has a five-month history of redness and slight drainage from the left nipple. Mammograms were performed on 11/13/2012. There was no suspicious mass or microcalcifications in either breast. This E. Category 2, good-quality study. Exam revealed erythematous nipple but no ulceration. Biopsy of the nipple was recommended.  She saw Dr. Franky Macho. On 01/10/2013 he took her to the operating room for a conservative elliptical biopsy of the left nipple and areola. 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 Deep and lateral margins are positive, not unexpectedly. ER and PR negative.  Dr. Lovell Sheehan has recommended a left mastectomy, and the patient and family said that he plans to do a sentinel node biopsy, but not an axillary lymph node dissection. She has had no problems since her biopsy.  Family history is negative for breast or ovarian cancer.  She has some comorbidities, including colectomy for diverticulitis,  IBS, hypertension, hyperlipidemia, diet controlled diabetes, history Cerebro vascular accident without residual.  She is generally healthy and independent. HPI  Past Medical History  Diagnosis Date  . Diverticulitis     s/p colectomy in 1991  . IBS (irritable bowel syndrome)   . HTN (hypertension)   . Hyperlipidemia   . S/P colonoscopy 2003    minimal internal hemorrhoids, pancolonic diverticula, biopsy of rectum: lymphoplasmacytic microscopic colitis, colon biopsies normal  . S/P colonoscopy 2012    pancolonic diverticula, nl TI, external hemorrhoidal tag,/rectum bx no  abnormalities  . S/P endoscopy 2012    normal esophagus, stenotic pyloric channel, TTS dilation  . Diarrhea   . GERD (gastroesophageal reflux disease)   . Arthritis   . Hypothyroidism   . Diabetes mellitus     no meds.-diet controlled  . Stroke 08/10/2006    no residual  . HOH (hard of hearing)   . Anxiety     Past Surgical History  Procedure Laterality Date  . Colectomy    . Cholecystectomy    . Right ovarian tumor removed as teenager    . Abdominal hysterectomy    . Knee surgery Right     2004- [facility]- total; knee  . Bilateral cataracts    . Esophagogastroduodenoscopy  10/11/2010    Normal esophagus, small hiatal hernia,Stenotic pyloric channel likely critical.  Likely contributing to some of the patient's symptoms, status post TTS balloon dilation Normal D1-D3 status post biopsy.  D2-D3 atrophic-appearing gastric mucosa status post biopsy  . Ileocolonoscopy  10/11/2010    external hemorrhoidal tag.  Normal rectum status post biopsy Pancolonic diverticula status post segmental biopsy.  Normal  terminal ileum  . Colonoscopy  09/11/01    Minimal internal hemorrhoids, otherwise normal rectum . Scattered pancolonic diverticula (few) The colonic mucosa otherwise appeared normal status post biopsies and stool sampling as described above.  . Total knee arthroplasty  09/25/2011    Procedure: TOTAL KNEE ARTHROPLASTY;  Surgeon: [name], MD;  Location: [facility];  Service: Orthopedics;  Laterality: Left;  . Breast biopsy Left 01/10/2013    Procedure: BREAST BIOPSY;  Surgeon: Dalia Heading, MD;  Location: [facility];  Service: General;  Laterality: Left;    Family History  Problem Relation Age of Onset  . Colon cancer Neg Hx   . Stroke Maternal Grandmother     deceased after 3 strokes  . Stroke Mother     deceased after 3 strokes 10/20/99  . Stroke Sister     twin sister  . Diabetes Father     Social History History  Substance Use Topics  . Smoking status: Never Smoker   .  Smokeless tobacco: Never Used  . Alcohol Use: Yes     Comment: occassional wine    No Known Allergies  Current Outpatient Prescriptions  Medication Sig Dispense Refill  . acetaminophen (TYLENOL) 500 MG tablet Take 500-1,000 mg by mouth every 6 (six) hours as needed. Pain       . ALPRAZolam (XANAX) 0.5 MG tablet Take 0.5 mg by mouth 2 (two) times daily as needed for sleep or anxiety.      Marland Kitchen aspirin 325 MG tablet Take 325 mg by mouth daily.      Marland Kitchen atorvastatin (LIPITOR) 10 MG tablet Take 10 mg by mouth daily after breakfast.      . cholestyramine (QUESTRAN) 4 GM/DOSE powder Take 1 to 2 grams twice daily as directed.  378 g  11  . citalopram (CELEXA) 20 MG tablet Take 20 mg by mouth daily after breakfast.      . HYDROcodone-acetaminophen (NORCO/VICODIN) 5-325 MG per tablet Take 0.5-1 tablets by mouth every 6 (six) hours as needed for pain.      Marland Kitchen levothyroxine (SYNTHROID, LEVOTHROID) 50 MCG tablet Take 50 mcg by mouth daily before breakfast.       . losartan (COZAAR) 50 MG tablet Take 50 mg by mouth daily.      . RABEprazole (ACIPHEX) 20 MG tablet Take 1 tablet (20 mg total) by mouth daily.  90 tablet  3  . RESTASIS 0.05 % ophthalmic emulsion Place 1 drop into both eyes every 12 (twelve) hours.        No current facility-administered medications for this visit.    Review of Systems Review of Systems  Constitutional: Negative for fever, chills and unexpected weight change.  HENT: Negative for hearing loss, congestion, sore throat, trouble swallowing and voice change.   Eyes: Negative for visual disturbance.  Respiratory: Negative for cough and wheezing.   Cardiovascular: Negative for chest pain, palpitations and leg swelling.  Gastrointestinal: Negative for nausea, vomiting, abdominal pain, diarrhea, constipation, blood in stool, abdominal distention and anal bleeding.  Genitourinary: Negative for hematuria, vaginal bleeding and difficulty urinating.  Musculoskeletal: Negative for  arthralgias.  Skin: Negative for rash and wound.  Neurological: Negative for seizures, syncope and headaches.  Hematological: Negative for adenopathy. Does not bruise/bleed easily.  Psychiatric/Behavioral: Negative for confusion.    Blood pressure 130/78, pulse 68, temperature 98.4 F (36.9 C), temperature source Temporal, resp. rate 14, height 5' (1.524 m), weight 116 lb (52.617 kg).  Physical Exam Physical Exam  Constitutional: She is oriented to person, place, and time. She appears well-developed and well-nourished. No distress.  HENT:  Head: Normocephalic and atraumatic.  Nose: Nose normal.  Mouth/Throat: No oropharyngeal exudate.  Eyes: Conjunctivae and EOM are normal. Pupils are equal, round, and reactive to light. Left eye exhibits no discharge. No scleral icterus.  Neck: Neck supple. No JVD present. No tracheal deviation present. No thyromegaly present.  Cardiovascular: Normal rate, regular rhythm, normal heart sounds and intact  distal pulses.   No murmur heard. Pulmonary/Chest: Effort normal and breath sounds normal. No respiratory distress. She has no wheezes. She has no rales. She exhibits no tenderness.  Breasts are medium sized. Soft. No palpable mass. No axillary adenopathy. There is incision and some Dermabond on the left nipple. No sign of infection or seroma No underlying mass.  Abdominal: Soft. Bowel sounds are normal. She exhibits no distension and no mass. There is no tenderness. There is no rebound and no guarding.  Lower midline scar.  Musculoskeletal: She exhibits no edema and no tenderness.  Lymphadenopathy:    She has no cervical adenopathy.  Neurological: She is alert and oriented to person, place, and time. She exhibits normal muscle tone. Coordination normal.  Skin: Skin is warm. No rash noted. She is not diaphoretic. No erythema. No pallor.  Psychiatric: She has a normal mood and affect. Her behavior is normal. Judgment and thought content normal.    Data  Reviewed Mammograms, pathology report, operative summary, Dr. Ernie Avena office notes.  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  Recovering uneventfully following a recent conservative biopsy. Positive deep and lateral margins are expected considering the conservative nature of the biopsy  No evidence of second underlying malignancy based on physical exam and mammogram  History cerebrovascular accident without residual  Diet-controlled diabetes  Hypertension     Plan    I had a very long discussion with the patient, her husband, and her daughter. I told her that surgical management of her disease will allow options. Left total mastectomy and sentinel node biopsy is one standard of care. A central lumpectomy and postop radiation therapy is another standard of care, with equivalent survival advantage. It is doubtful that her oncologist will offer her antiestrogen  therapy, and so I explained to her that she probably would need radiation therapy to have equivalent outcomes to mastectomy.  I doubt that I would recommend a SLN bipsy if central lumpectomy was chosen.   Preop MRI can be considered, since up to 1/3 of Paget's patients reportedly have a second underlying malignancy, although not necessarily in her case.  True value of MRI in her individual case is controversial.  We spent a long time discussing these issues. She and the family clearly understand the 2 options and pros and cons of each. She indicated that she prefers mastectomy.  She will return to Dr. Franky Macho for surgical planning.       [name]. Derrell Lolling, M.D., [facility] General and Minimally invasive Surgery Breast and Colorectal Surgery Office:   [phone] Pager:   [phone]  01/24/2013, 9:54 AM

## 2013-01-31 ENCOUNTER — Other Ambulatory Visit (INDEPENDENT_AMBULATORY_CARE_PROVIDER_SITE_OTHER): Payer: Self-pay

## 2013-01-31 ENCOUNTER — Telehealth (INDEPENDENT_AMBULATORY_CARE_PROVIDER_SITE_OTHER): Payer: Self-pay

## 2013-01-31 ENCOUNTER — Telehealth (INDEPENDENT_AMBULATORY_CARE_PROVIDER_SITE_OTHER): Payer: Self-pay | Admitting: General Surgery

## 2013-01-31 DIAGNOSIS — C50012 Malignant neoplasm of nipple and areola, left female breast: Secondary | ICD-10-CM

## 2013-01-31 NOTE — Telephone Encounter (Signed)
Per Dr Derrell Lolling offer preop mri.  If pt does not want it we can proceed with scheduling surgery.  I called and notified the pt.  She is ok with getting the MRI done.  We will schedule that.  I told her it will be in [place].

## 2013-01-31 NOTE — Telephone Encounter (Signed)
Called pt and informed her that she is set up for MRI at [facility] located at [address] on 02/11/13 at 9:45.  She explained that this would be fine.

## 2013-01-31 NOTE — Telephone Encounter (Signed)
Message copied by Ivory Broad on Fri Jan 31, 2013  9:37 AM ------      Message from: Zacarias Pontes      Created: Thu Jan 30, 2013  9:45 AM       Pt would like a call back she wants Dr Derrell Lolling to do her sx asap...[phone]..thanks Huntley Dec ------

## 2013-01-31 NOTE — Telephone Encounter (Signed)
I called and spoke to the pt.  She wants Dr Derrell Lolling to do her partial mastectomy instead of Dr Lovell Sheehan.  She feels comfortable with him.  She wants to know the next step.  I will see if Dr Derrell Lolling will just go ahead and schedule her or if he wants to see her back 1st.  I will call Mrs Milosevic back today.

## 2013-02-11 ENCOUNTER — Ambulatory Visit
Admission: RE | Admit: 2013-02-11 | Discharge: 2013-02-11 | Disposition: A | Discharge status: Home / Self Care | Payer: Medicare Other | Source: Ambulatory Visit | Attending: General Surgery | Admitting: General Surgery

## 2013-02-11 DIAGNOSIS — C50012 Malignant neoplasm of nipple and areola, left female breast: Secondary | ICD-10-CM

## 2013-02-11 DIAGNOSIS — N6489 Other specified disorders of breast: Secondary | ICD-10-CM | POA: Diagnosis not present

## 2013-02-11 MED ORDER — GADOBENATE DIMEGLUMINE 529 MG/ML IV SOLN
9.0000 mL | Freq: Once | INTRAVENOUS | Status: AC | PRN
  Administered 2013-02-11: 9 mL via INTRAVENOUS

## 2013-02-12 ENCOUNTER — Telehealth (INDEPENDENT_AMBULATORY_CARE_PROVIDER_SITE_OTHER): Payer: Self-pay | Admitting: General Surgery

## 2013-02-12 ENCOUNTER — Telehealth (INDEPENDENT_AMBULATORY_CARE_PROVIDER_SITE_OTHER): Payer: Self-pay

## 2013-02-12 NOTE — Telephone Encounter (Signed)
The MRI breast on Angela Galvan shows a 5 cm area of abnormal enhancement extending posteriorly from the nipple. This is a very narrow thin area of enhancement, but was read as highly suspicious for DCIS by Dr. Deboraha Sprang. I spoke with Angela Galvan today and discussed options of lumpectomy and mastectomy. This was a somewhat confusing to her, but in the and she said she still wants to be considered as a candidate for lumpectomy. We're going to schedule her for an MRI guided biopsy of the most posterior aspect of the abnormal enhancement to define the extent of her disease. She will see me in the office about 3 days later to discuss the pathology report and to make final decisions.   [name]. Derrell Lolling, M.D., [facility] General and Minimally invasive Surgery Breast and Colorectal Surgery Office:   [phone] Pager:   [phone]

## 2013-02-12 NOTE — Telephone Encounter (Signed)
LMOM to call me to discuss MRI results.

## 2013-02-12 NOTE — Telephone Encounter (Signed)
Patient called in returning Dr Jacinto Halim call. Advised I will let him know. She will be available this afternoon at [phone].

## 2013-02-13 ENCOUNTER — Other Ambulatory Visit (INDEPENDENT_AMBULATORY_CARE_PROVIDER_SITE_OTHER): Payer: Self-pay

## 2013-02-13 ENCOUNTER — Other Ambulatory Visit (INDEPENDENT_AMBULATORY_CARE_PROVIDER_SITE_OTHER): Payer: Self-pay | Admitting: General Surgery

## 2013-02-13 DIAGNOSIS — C50012 Malignant neoplasm of nipple and areola, left female breast: Secondary | ICD-10-CM

## 2013-02-18 ENCOUNTER — Ambulatory Visit
Admission: RE | Admit: 2013-02-18 | Discharge: 2013-02-18 | Disposition: A | Discharge status: Home / Self Care | Payer: Medicare Other | Source: Ambulatory Visit | Attending: General Surgery | Admitting: General Surgery

## 2013-02-18 DIAGNOSIS — C50012 Malignant neoplasm of nipple and areola, left female breast: Secondary | ICD-10-CM

## 2013-02-18 DIAGNOSIS — D0590 Unspecified type of carcinoma in situ of unspecified breast: Secondary | ICD-10-CM | POA: Diagnosis not present

## 2013-02-18 DIAGNOSIS — N6489 Other specified disorders of breast: Secondary | ICD-10-CM | POA: Diagnosis not present

## 2013-02-18 MED ORDER — GADOBENATE DIMEGLUMINE 529 MG/ML IV SOLN
9.0000 mL | Freq: Once | INTRAVENOUS | Status: AC | PRN
  Administered 2013-02-18: 9 mL via INTRAVENOUS

## 2013-02-20 ENCOUNTER — Ambulatory Visit (INDEPENDENT_AMBULATORY_CARE_PROVIDER_SITE_OTHER): Payer: Medicare Other | Admitting: General Surgery

## 2013-02-20 ENCOUNTER — Encounter (INDEPENDENT_AMBULATORY_CARE_PROVIDER_SITE_OTHER): Payer: Self-pay | Admitting: General Surgery

## 2013-02-20 VITALS — BP 138/80 | HR 68 | Temp 97.8°F | Resp 14 | Ht 60.0 in | Wt 118.0 lb

## 2013-02-20 DIAGNOSIS — C50012 Malignant neoplasm of nipple and areola, left female breast: Secondary | ICD-10-CM

## 2013-02-20 DIAGNOSIS — C50919 Malignant neoplasm of unspecified site of unspecified female breast: Secondary | ICD-10-CM

## 2013-02-20 NOTE — Progress Notes (Signed)
Patient ID: Angela Galvan, female   DOB: 30-Mar-1933, 79 y.o.   MRN: [id number]  Chief Complaint  Patient presents with  . New Evaluation    discuss mr bx    HPI Angela Galvan is a 79 y.o. female.  She returns with her family to discuss management of her left breast cancer, and options for surgical intervention.  She called me and said that she wanted me to consider doing her surgery and she was interested in lumpectomy if possible. Because of the risk of a second malignancy within the breast and Paget's disease, we did an MRI. There was a linear area of enhancement extending from the nipple area straight back approximately 60-70% of the anterior to posterior dimension of the left breast. Image guided biopsy of the middle area of this showed ductal carcinoma in situ with necrosis. Recall that her Paget's disease showed DCIS, high grade.  I spent a long time talking with the patient, her husband, and her daughter today. I told her that we could consider MR wire localization of the most posterior aspect of this and a central lumpectomy. Because of the high-grade nature of this a sentinel node biopsy is probably a good idea, although somewhat controversial at her age. Breast diagnostic profile is still pending, as she knows that she may need antiestrogen therapy. She was offered a second opinion with a medical oncologist and declined at that preop. She understands that she will need radiation therapy if she has a lumpectomy. She understands that there is a possibility of positive margins requiring reexcision. At the end of the discussion she was more comfortable with a left simple mastectomy and sentinel node biopsy.   I offered to refer her back to Dr. Lovell Sheehan, and she stated that she would like for me to do the surgery. I told her that was fine. HPI  Past Medical History  Diagnosis Date  . Diverticulitis     s/p colectomy in 1991  . IBS (irritable bowel syndrome)   . HTN (hypertension)   .  Hyperlipidemia   . S/P colonoscopy 2003    minimal internal hemorrhoids, pancolonic diverticula, biopsy of rectum: lymphoplasmacytic microscopic colitis, colon biopsies normal  . S/P colonoscopy 2012    pancolonic diverticula, nl TI, external hemorrhoidal tag,/rectum bx no abnormalities  . S/P endoscopy 2012    normal esophagus, stenotic pyloric channel, TTS dilation  . Diarrhea   . GERD (gastroesophageal reflux disease)   . Arthritis   . Hypothyroidism   . Diabetes mellitus     no meds.-diet controlled  . Stroke 08/10/2006    no residual  . HOH (hard of hearing)   . Anxiety     Past Surgical History  Procedure Laterality Date  . Colectomy    . Cholecystectomy    . Right ovarian tumor removed as teenager    . Abdominal hysterectomy    . Knee surgery Right     2004- [facility]- total; knee  . Bilateral cataracts    . Esophagogastroduodenoscopy  10/11/2010    Normal esophagus, small hiatal hernia,Stenotic pyloric channel likely critical.  Likely contributing to some of the patient's symptoms, status post TTS balloon dilation Normal D1-D3 status post biopsy.  D2-D3 atrophic-appearing gastric mucosa status post biopsy  . Ileocolonoscopy  10/11/2010    external hemorrhoidal tag.  Normal rectum status post biopsy Pancolonic diverticula status post segmental biopsy.  Normal  terminal ileum  . Colonoscopy  09/11/01    Minimal internal hemorrhoids, otherwise normal  rectum . Scattered pancolonic diverticula (few) The colonic mucosa otherwise appeared normal status post biopsies and stool sampling as described above.  . Total knee arthroplasty  09/25/2011    Procedure: TOTAL KNEE ARTHROPLASTY;  Surgeon: [name], MD;  Location: [facility];  Service: Orthopedics;  Laterality: Left;  . Breast biopsy Left 01/10/2013    Procedure: BREAST BIOPSY;  Surgeon: Dalia Heading, MD;  Location: [facility];  Service: General;  Laterality: Left;    Family History  Problem Relation Age of Onset  . Colon cancer  Neg Hx   . Stroke Maternal Grandmother     deceased after 3 strokes  . Stroke Mother     deceased after 3 strokes 11/02/99  . Stroke Sister     twin sister  . Diabetes Father     Social History History  Substance Use Topics  . Smoking status: Never Smoker   . Smokeless tobacco: Never Used  . Alcohol Use: Yes     Comment: occassional wine    No Known Allergies  Current Outpatient Prescriptions  Medication Sig Dispense Refill  . acetaminophen (TYLENOL) 500 MG tablet Take 500-1,000 mg by mouth every 6 (six) hours as needed. Pain       . ALPRAZolam (XANAX) 0.5 MG tablet Take 0.5 mg by mouth 2 (two) times daily as needed for sleep or anxiety.      Marland Kitchen aspirin 325 MG tablet Take 325 mg by mouth daily.      Marland Kitchen atorvastatin (LIPITOR) 10 MG tablet Take 10 mg by mouth daily after breakfast.      . cholestyramine (QUESTRAN) 4 GM/DOSE powder Take 1 to 2 grams twice daily as directed.  378 g  11  . citalopram (CELEXA) 20 MG tablet Take 20 mg by mouth daily after breakfast.      . HYDROcodone-acetaminophen (NORCO/VICODIN) 5-325 MG per tablet Take 0.5-1 tablets by mouth every 6 (six) hours as needed for pain.      Marland Kitchen levothyroxine (SYNTHROID, LEVOTHROID) 50 MCG tablet Take 50 mcg by mouth daily before breakfast.       . losartan (COZAAR) 50 MG tablet Take 50 mg by mouth daily.      . RABEprazole (ACIPHEX) 20 MG tablet Take 1 tablet (20 mg total) by mouth daily.  90 tablet  3  . RESTASIS 0.05 % ophthalmic emulsion Place 1 drop into both eyes every 12 (twelve) hours.        No current facility-administered medications for this visit.    Review of Systems Review of Systems  Constitutional: Negative for fever, chills and unexpected weight change.  HENT: Negative for hearing loss, congestion, sore throat, trouble swallowing and voice change.   Eyes: Negative for visual disturbance.  Respiratory: Negative for cough and wheezing.   Cardiovascular: Negative for chest pain, palpitations and leg  swelling.  Gastrointestinal: Negative for nausea, vomiting, abdominal pain, diarrhea, constipation, blood in stool, abdominal distention and anal bleeding.  Genitourinary: Negative for hematuria, vaginal bleeding and difficulty urinating.  Musculoskeletal: Negative for arthralgias.  Skin: Negative for rash and wound.  Neurological: Negative for seizures, syncope and headaches.  Hematological: Negative for adenopathy. Does not bruise/bleed easily.  Psychiatric/Behavioral: Negative for confusion.    Blood pressure 138/80, pulse 68, temperature 97.8 F (36.6 C), temperature source Temporal, resp. rate 14, height 5' (1.524 m), weight 118 lb (53.524 kg).  Physical Exam Physical Exam  Constitutional: She is oriented to person, place, and time. She appears well-developed and well-nourished. No distress.  HENT:  Head: Normocephalic and atraumatic.  Nose: Nose normal.  Mouth/Throat: No oropharyngeal exudate.  Eyes: Conjunctivae and EOM are normal. Pupils are equal, round, and reactive to light. Left eye exhibits no discharge. No scleral icterus.  Neck: Neck supple. No JVD present. No tracheal deviation present. No thyromegaly present.  Cardiovascular: Normal rate, regular rhythm, normal heart sounds and intact distal pulses.   No murmur heard. Pulmonary/Chest: Effort normal and breath sounds normal. No respiratory distress. She has no wheezes. She has no rales. She exhibits no tenderness.  Healing scar left nipple. No infection. Recent core biopsy site lateral left breast. No hematoma. No axillary adenopathy.  Abdominal: Soft. Bowel sounds are normal. She exhibits no distension and no mass. There is no tenderness. There is no rebound and no guarding.  Lower midline scar  Musculoskeletal: She exhibits no edema and no tenderness.  Lymphadenopathy:    She has no cervical adenopathy.  Neurological: She is alert and oriented to person, place, and time. She exhibits normal muscle tone. Coordination  normal.  Skin: Skin is warm. No rash noted. She is not diaphoretic. No erythema. No pallor.  Psychiatric: She has a normal mood and affect. Her behavior is normal. Judgment and thought content normal.    Data Reviewed I have reviewed her mammograms,  ultrasounds, her MRI, the MRI guided biopsy, the pathology. I reviewed these with 2 of the breast radiologist upstairs today.  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 Carcinoma in situ with necrosis, deep within left breast retroareolar area. Central lumpectomy if technically feasible, but somewhat challenging. The pleura unexpected 20% at least volume loss. She does not while lumpectomy. She wants a mastectomy.  History Strubel vascular accident without residual  Diet-controlled diabetes  Hypertension    Plan    Schedule for left total mastectomy with left axillary sentinel node biopsy  Discussed indications for details, techniques, and numerous risks of the surgery with them again. They are aware of the risk of bleeding, infection, skin necrosis, arm swelling, or numbness, and other unforseen problems. She understands all these issues. All of her questions were answered. She agrees with this plan. She knows she'll need to stay in the hospital one or 2 nights.        [name]. Derrell Lolling, M.D., [facility] General and Minimally invasive Surgery Breast and Colorectal Surgery Office:   [phone] Pager:   [phone]  02/20/2013, 1:35 PM

## 2013-02-20 NOTE — Patient Instructions (Addendum)
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 We have talked about the options of central lumpectomy with wire localization and postop radiation therapy, and we have discussed the option of mastectomy.  After extensive discussion, you have decided that you would like for me to proceed with left total mastectomy and left axillary sentinel node biopsy.  We will schedule the surgery at [facility] hospital in the near future.  You will be referred to a medical oncologist postoperatively.      Mastectomy, With or Without Reconstruction Mastectomy (removal of the breast) is a procedure most commonly used to treat cancer (tumor) of the breast. Different procedures are available for treatment. This depends on the stage of the tumor (abnormal growths). Discuss this with your caregiver, surgeon (a specialist for performing operations such as this), or oncologist (someone specialized in the treatment of cancer). With proper information, you can decide which treatment is best for you. Although the sound of the word cancer is frightening to all of [place], the new treatments and medications can be a source of reassurance and comfort. If there are things you are worried about, discuss them with your caregiver. He or she can help comfort you and your family. Some of the different procedures for treating breast cancer are:  Radical (extensive) mastectomy. This is an operation used to remove the entire breast, the muscles under the breast, and all of the glands (lymph nodes) under the arm. With all of the new treatments available for cancer of the breast, this procedure has become less common.  Modified radical mastectomy. This is a similar operation to the radical mastectomy described above. In the modified radical mastectomy, the muscles of the chest wall are not removed unless one of the lessor muscles is removed. One of the lessor muscles may be removed to allow better  removal of the lymph nodes. The axillary lymph nodes are also removed. Rarely, during an axillary node dissection nerves to this area are damaged. Radiation therapy is then often used to the area following this surgery.  A total mastectomy also known as a complete or simple mastectomy. It involves removal of only the breast. The lymph nodes and the muscles are left in place.  In a lumpectomy, the lump is removed from the breast. This is the simplest form of surgical treatment. A sentinel lymph node biopsy may also be done. Additional treatment may be required. RISKS AND COMPLICATIONS The main problems that follow removal of the breast include:  Infection (germs start growing in the wound). This can usually be treated with antibiotics (medications that kill germs).  Lymphedema. This means the arm on the side of the breast that was operated on swells because the lymph (tissue fluid) cannot follow the main channels back into the body. This only occurs when the lymph nodes have had to be removed under the arm.  There may be some areas of numbness to the upper arm and around the incision (cut by the surgeon) in the breast. This happens because of the cutting of or damage to some of the nerves in the area. This is most often unavoidable.  There may be difficulty moving the arm in a full range of motion (moving in all directions) following surgery. This usually improves with time following use and exercise.  Recurrence of breast cancer may happen with the very best of surgery and follow up treatment. Sometimes small cancer cells that cannot be seen with the  naked eye have already spread at the time of surgery. When this happens other treatment is available. This treatment may be radiation, medications or a combination of both. RECONSTRUCTION Reconstruction of the breast may be done immediately if there is not going to be post-operative radiation. This surgery is done for cosmetic (improve appearance)  purposes to improve the physical appearance after the operation. This may be done in two ways:  It can be done using a saline filled prosthetic (an artificial breast which is filled with salt water). Silicone breast implants are now re-approved by the FDA and are being commonly used.  Reconstruction can be done using the body's own muscle/fat/skin. Your caregiver will discuss your options with you. Depending upon your needs or choice, together you will be able to determine which procedure is best for you. Document Released: 02/07/2001 Document Revised: 02/07/2012 Document Reviewed: 10/01/2007 [facility] Patient Information 2014 [place], [place].      Mastectomy Care After HOME CARE    Care for your wound after the bandages are off as told by your doctor.  Put soft padding such as gauze, soft cloth, or a nursing pad over your wound if you wear a bra.  Ask your doctor about groups that can help you with any emotions you may have after the surgery.  Exercise your arm and shoulder as told by your doctor.  Place your hands on a wall. Use your fingers to "climb the wall." Reach as high as you can until you feel a stretch. When you are not exercising, keep your arm raised (elevated).  When sitting or lying down, put your arm up on pillows or rolled blankets.  Do not use your arm to lift or push anything heavier than 10 pounds (about one gallon of milk ) for the first 6 weeks.  Always take good care of the arm on the side that the breast was removed.  Never let anyone take your blood pressure, draw blood, or give you a shot in that arm.  Do not get even a small cut on that arm or hand. Use a thimble when you sew. Wear heavy gloves when you garden.  Use insect repellent on that arm if outside.  Do not use a razor to shave that underarm. You should use only an electric shaver.  Do not burn that arm. Use a glove when you reach into the oven. Cover your arm with a towel or wear a  long-sleeved shirt when you are out in the sun.  Wear your watch and other jewelry on the other arm.  Wear a loose fitting rubber glove when you wash the dishes. Do not leave your hand in water for a long time, especially when you use detergents.  Do not cut your cuticles or hang nails. Push cuticles back with a towel after you take a bath.  Carry your purse or any heavy objects in the other arm. GET HELP RIGHT AWAY IF:   Your arm becomes very puffy (swollen).  You have redness or pain at the wound site.  There is a bad smell coming from the wound.  Thre is yellowish white fluid (pus) coming from your wound.  You have a fever. MAKE SURE YOU:   Understand these instructions.  Will watch your condition.  Will get help right away if you are not doing well or get worse. Document Released: 02/22/2008 Document Revised: 08/07/2011 Document Reviewed: 02/22/2008 [facility] Patient Information 2014 [place], [place].

## 2013-02-27 ENCOUNTER — Encounter (HOSPITAL_COMMUNITY): Payer: Self-pay | Admitting: Respiratory Therapy

## 2013-03-01 NOTE — Pre-Procedure Instructions (Signed)
Angela Galvan  03/01/2013   Your procedure is scheduled on:  October 10  Report to [facility] Entrance "A" [address] at [employer] AM.  Call this number if you have problems the morning of surgery: [phone]   Remember:   Do not eat food or drink liquids after midnight.   Take these medicines the morning of surgery with A SIP OF WATER: Tylenol (if needed), Xanax (if needed), Celexa, Hydrocodone (if needed), Levothyroxine, Aciphex, Eye Drops   STOP Aspirin today   Do not take Aspirin, Aleve, Naproxen, Advil, Ibuprofen, Vitamin, Herbs, or Supplements starting today   Do not wear jewelry, make-up or nail polish.  Do not wear lotions, powders, or perfumes. You may wear deodorant.  Do not shave 48 hours prior to surgery. Men may shave face and neck.  Do not bring valuables to the hospital.  [facility] is not responsible                  for any belongings or valuables.               Contacts, dentures or bridgework may not be worn into surgery.  Leave suitcase in the car. After surgery it may be brought to your room.  For patients admitted to the hospital, discharge time is determined by your                treatment team.               Special Instructions: Shower using CHG 2 nights before surgery and the night before surgery.  If you shower the day of surgery use CHG.  Use special wash - you have one bottle of CHG for all showers.  You should use approximately 1/3 of the bottle for each shower.   Please read over the following fact sheets that you were given: Pain Booklet, Coughing and Deep Breathing and Surgical Site Infection Prevention

## 2013-03-03 ENCOUNTER — Encounter (HOSPITAL_COMMUNITY)
Admission: RE | Admit: 2013-03-03 | Discharge: 2013-03-03 | Disposition: A | Discharge status: Home / Self Care | Payer: Medicare Other | Source: Ambulatory Visit | Attending: General Surgery | Admitting: General Surgery

## 2013-03-03 ENCOUNTER — Encounter (HOSPITAL_COMMUNITY): Payer: Self-pay

## 2013-03-03 ENCOUNTER — Ambulatory Visit (HOSPITAL_COMMUNITY)
Admission: RE | Admit: 2013-03-03 | Discharge: 2013-03-03 | Disposition: A | Discharge status: Home / Self Care | Payer: Medicare Other | Source: Ambulatory Visit | Attending: General Surgery | Admitting: General Surgery

## 2013-03-03 DIAGNOSIS — Z01818 Encounter for other preprocedural examination: Secondary | ICD-10-CM | POA: Diagnosis not present

## 2013-03-03 DIAGNOSIS — Z01812 Encounter for preprocedural laboratory examination: Secondary | ICD-10-CM | POA: Diagnosis not present

## 2013-03-03 LAB — CBC WITH DIFFERENTIAL/PLATELET
Basophils Absolute: 0.1 10*3/uL (ref 0.0–0.1)
Basophils Relative: 1 % (ref 0–1)
Eosinophils Absolute: 0.1 10*3/uL (ref 0.0–0.7)
Hemoglobin: 14 g/dL (ref 12.0–15.0)
MCH: 31.3 pg (ref 26.0–34.0)
MCHC: 35.4 g/dL (ref 30.0–36.0)
Monocytes Relative: 9 % (ref 3–12)
Neutro Abs: 5.2 10*3/uL (ref 1.7–7.7)
Neutrophils Relative %: 53 % (ref 43–77)
Platelets: 386 10*3/uL (ref 150–400)
RDW: 12.6 % (ref 11.5–15.5)

## 2013-03-03 LAB — COMPREHENSIVE METABOLIC PANEL
ALT: 32 U/L (ref 0–35)
AST: 22 U/L (ref 0–37)
Albumin: 4 g/dL (ref 3.5–5.2)
Alkaline Phosphatase: 101 U/L (ref 39–117)
Chloride: 101 mEq/L (ref 96–112)
Potassium: 3.9 mEq/L (ref 3.5–5.1)
Sodium: 139 mEq/L (ref 135–145)
Total Bilirubin: 0.5 mg/dL (ref 0.3–1.2)
Total Protein: 7.3 g/dL (ref 6.0–8.3)

## 2013-03-04 NOTE — H&P (Signed)
Angela Galvan     MRN:  [id number]   Description: 79 year old female  Provider: Ernestene Mention, MD  Department: [facility]        Diagnoses    Paget's disease of left breast    -  Primary    174.9      Reason for Visit    New Evaluation    discuss mr bx        Current Vitals - Last Recorded    BP Pulse Temp(Src) Resp Ht Wt    138/80 68 97.8 F (36.6 C) (Temporal) 14 5' (1.524 m) 118 lb (53.524 kg)       BMI  23.05 kg/m2                     History and Physical   Ernestene Mention, MD    Status: Signed                           HPI Angela Galvan is a 79 y.o. female.  She returns with her family to discuss management of her left breast cancer, and options for surgical intervention.   She called me and said that she wanted me to consider doing her surgery and she was interested in lumpectomy if possible. Because of the risk of a second malignancy within the breast and Paget's disease, we did an MRI. There was a linear area of enhancement extending from the nipple area straight back approximately 60-70% of the anterior to posterior dimension of the left breast. Image guided biopsy of the middle area of this showed ductal carcinoma in situ with necrosis. Recall that her Paget's disease showed DCIS, high grade.   I spent a long time talking with the patient, her husband, and her daughter today. I told her that we could consider MR wire localization of the most posterior aspect of this and a central lumpectomy. Because of the high-grade nature of this a sentinel node biopsy is probably a good idea, although somewhat controversial at her age. Breast diagnostic profile is still pending, as she knows that she may need antiestrogen therapy. She was offered a second opinion with a medical oncologist and declined at that preop. She understands that she will need radiation therapy if she has a lumpectomy. She understands that there is a possibility of positive  margins requiring reexcision. At the end of the discussion she was more comfortable with a left simple mastectomy and sentinel node biopsy.    I offered to refer her back to Dr. Lovell Sheehan, and she stated that she would like for me to do the surgery. I told her that was fine.       Past Medical History   Diagnosis  Date   .  Diverticulitis         s/p colectomy in 1991   .  IBS (irritable bowel syndrome)     .  HTN (hypertension)     .  Hyperlipidemia     .  S/P colonoscopy  2003       minimal internal hemorrhoids, pancolonic diverticula, biopsy of rectum: lymphoplasmacytic microscopic colitis, colon biopsies normal   .  S/P colonoscopy  2012       pancolonic diverticula, nl TI, external hemorrhoidal tag,/rectum bx no abnormalities   .  S/P endoscopy  2012       normal esophagus, stenotic pyloric channel, TTS dilation   .  Diarrhea     .  GERD (gastroesophageal reflux disease)     .  Arthritis     .  Hypothyroidism     .  Diabetes mellitus         no meds.-diet controlled   .  Stroke  08/10/2006       no residual   .  HOH (hard of hearing)     .  Anxiety           Past Surgical History   Procedure  Laterality  Date   .  Colectomy       .  Cholecystectomy       .  Right ovarian tumor removed as teenager       .  Abdominal hysterectomy       .  Knee surgery  Right         10-Oct-2002- [facility]- total; knee   .  Bilateral cataracts       .  Esophagogastroduodenoscopy    10/11/2010       Normal esophagus, small hiatal hernia,Stenotic pyloric channel likely critical.  Likely contributing to some of the patient's symptoms, status post TTS balloon dilation Normal D1-D3 status post biopsy.  D2-D3 atrophic-appearing gastric mucosa status post biopsy   .  Ileocolonoscopy    10/11/2010       external hemorrhoidal tag.  Normal rectum status post biopsy Pancolonic diverticula status post segmental biopsy.  Normal  terminal ileum   .  Colonoscopy    09/11/01       Minimal internal hemorrhoids,  otherwise normal rectum . Scattered pancolonic diverticula (few) The colonic mucosa otherwise appeared normal status post biopsies and stool sampling as described above.   .  Total knee arthroplasty    09/25/2011       Procedure: TOTAL KNEE ARTHROPLASTY;  Surgeon: [name], MD;  Location: [facility];  Service: Orthopedics;  Laterality: Left;   .  Breast biopsy  Left  01/10/2013       Procedure: BREAST BIOPSY;  Surgeon: Dalia Heading, MD;  Location: [facility];  Service: General;  Laterality: Left;         Family History   Problem  Relation  Age of Onset   .  Colon cancer  Neg Hx     .  Stroke  Maternal Grandmother         deceased after 3 strokes   .  Stroke  Mother         deceased after 3 strokes Oct 10, 1999   .  Stroke  Sister         twin sister   .  Diabetes  Father          Social History History   Substance Use Topics   .  Smoking status:  Never Smoker    .  Smokeless tobacco:  Never Used   .  Alcohol Use:  Yes         Comment: occassional wine        No Known Allergies    Current Outpatient Prescriptions   Medication  Sig  Dispense  Refill   .  acetaminophen (TYLENOL) 500 MG tablet  Take 500-1,000 mg by mouth every 6 (six) hours as needed. Pain          .  ALPRAZolam (XANAX) 0.5 MG tablet  Take 0.5 mg by mouth 2 (two) times daily as needed for sleep or anxiety.         Marland Kitchen  aspirin 325 MG tablet  Take 325 mg by mouth daily.         Marland Kitchen  atorvastatin (LIPITOR) 10 MG tablet  Take 10 mg by mouth daily after breakfast.         .  cholestyramine (QUESTRAN) 4 GM/DOSE powder  Take 1 to 2 grams twice daily as directed.   378 g   11   .  citalopram (CELEXA) 20 MG tablet  Take 20 mg by mouth daily after breakfast.         .  HYDROcodone-acetaminophen (NORCO/VICODIN) 5-325 MG per tablet  Take 0.5-1 tablets by mouth every 6 (six) hours as needed for pain.         Marland Kitchen  levothyroxine (SYNTHROID, LEVOTHROID) 50 MCG tablet  Take 50 mcg by mouth daily before breakfast.          .  losartan  (COZAAR) 50 MG tablet  Take 50 mg by mouth daily.         .  RABEprazole (ACIPHEX) 20 MG tablet  Take 1 tablet (20 mg total) by mouth daily.   90 tablet   3   .  RESTASIS 0.05 % ophthalmic emulsion  Place 1 drop into both eyes every 12 (twelve) hours.                Review of Systems   Constitutional: Negative for fever, chills and unexpected weight change.  HENT: Negative for hearing loss, congestion, sore throat, trouble swallowing and voice change.   Eyes: Negative for visual disturbance.  Respiratory: Negative for cough and wheezing.   Cardiovascular: Negative for chest pain, palpitations and leg swelling.  Gastrointestinal: Negative for nausea, vomiting, abdominal pain, diarrhea, constipation, blood in stool, abdominal distention and anal bleeding.  Genitourinary: Negative for hematuria, vaginal bleeding and difficulty urinating.  Musculoskeletal: Negative for arthralgias.  Skin: Negative for rash and wound.  Neurological: Negative for seizures, syncope and headaches.  Hematological: Negative for adenopathy. Does not bruise/bleed easily.  Psychiatric/Behavioral: Negative for confusion.      Blood pressure 138/80, pulse 68, temperature 97.8 F (36.6 C), temperature source Temporal, resp. rate 14, height 5' (1.524 m), weight 118 lb (53.524 kg).   Physical Exam  Constitutional: She is oriented to person, place, and time. She appears well-developed and well-nourished. No distress.  HENT:   Head: Normocephalic and atraumatic.   Nose: Nose normal.   Mouth/Throat: No oropharyngeal exudate.  Eyes: Conjunctivae and EOM are normal. Pupils are equal, round, and reactive to light. Left eye exhibits no discharge. No scleral icterus.  Neck: Neck supple. No JVD present. No tracheal deviation present. No thyromegaly present.  Cardiovascular: Normal rate, regular rhythm, normal heart sounds and intact distal pulses.    No murmur heard. Pulmonary/Chest: Effort normal and breath sounds  normal. No respiratory distress. She has no wheezes. She has no rales. She exhibits no tenderness.  Healing scar left nipple. No infection. Recent core biopsy site lateral left breast. No hematoma. No axillary adenopathy.  Abdominal: Soft. Bowel sounds are normal. She exhibits no distension and no mass. There is no tenderness. There is no rebound and no guarding.  Lower midline scar  Musculoskeletal: She exhibits no edema and no tenderness.  Lymphadenopathy:    She has no cervical adenopathy.  Neurological: She is alert and oriented to person, place, and time. She exhibits normal muscle tone. Coordination normal.  Skin: Skin is warm. No rash noted. She is not diaphoretic. No erythema. No pallor.  Psychiatric: She  has a normal mood and affect. Her behavior is normal. Judgment and thought content normal.      Data Reviewed I have reviewed her mammograms,  ultrasounds, her MRI, the MRI guided biopsy, the pathology. I reviewed these with 2 of the breast radiologist upstairs today.   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 Carcinoma in situ with necrosis, deep within left breast retroareolar area. Central lumpectomy if technically feasible, but somewhat challenging. The pleura unexpected 20% at least volume loss. She does not while lumpectomy. She wants a mastectomy.   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 Breast conference consensus agrees.   Discussed indications for details, techniques, and numerous risks of the surgery with them again. They are aware of the risk of bleeding, infection, skin necrosis, arm swelling, or numbness, and other unforseen problems. She understands all these issues. All of her questions were answered. She agrees with this plan. She knows she'll need to  stay in the hospital one or 2 nights.           [name]. Derrell Lolling, M.D., [facility] General and Minimally invasive Surgery Breast and Colorectal Surgery Office:   [phone] Pager:   [phone]

## 2013-03-06 MED ORDER — CEFAZOLIN SODIUM-DEXTROSE 2-3 GM-% IV SOLR
2.0000 g | INTRAVENOUS | Status: AC
  Administered 2013-03-07: 2 g via INTRAVENOUS
  Filled 2013-03-06: qty 50

## 2013-03-06 MED ORDER — CHLORHEXIDINE GLUCONATE 4 % EX LIQD: 1.0000 "application " | Freq: Once | CUTANEOUS | Status: DC

## 2013-03-07 ENCOUNTER — Encounter (HOSPITAL_COMMUNITY): Payer: Medicare Other | Admitting: Anesthesiology

## 2013-03-07 ENCOUNTER — Encounter (HOSPITAL_COMMUNITY)
Admission: RE | Disposition: A | Discharge status: Home / Self Care | Payer: Self-pay | Source: Ambulatory Visit | Attending: General Surgery

## 2013-03-07 ENCOUNTER — Encounter (HOSPITAL_COMMUNITY)
Admission: RE | Admit: 2013-03-07 | Discharge: 2013-03-07 | Disposition: A | Discharge status: Home / Self Care | Payer: Medicare Other | Source: Ambulatory Visit | Attending: General Surgery | Admitting: General Surgery

## 2013-03-07 ENCOUNTER — Observation Stay (HOSPITAL_COMMUNITY)
Admission: RE | Admit: 2013-03-07 | Discharge: 2013-03-08 | Disposition: A | Discharge status: Home / Self Care | Payer: Medicare Other | Source: Ambulatory Visit | Attending: General Surgery | Admitting: General Surgery

## 2013-03-07 ENCOUNTER — Encounter (HOSPITAL_COMMUNITY): Payer: Self-pay | Admitting: Surgery

## 2013-03-07 ENCOUNTER — Ambulatory Visit (HOSPITAL_COMMUNITY): Payer: Medicare Other | Admitting: Anesthesiology

## 2013-03-07 DIAGNOSIS — Z79899 Other long term (current) drug therapy: Secondary | ICD-10-CM | POA: Insufficient documentation

## 2013-03-07 DIAGNOSIS — E119 Type 2 diabetes mellitus without complications: Secondary | ICD-10-CM | POA: Insufficient documentation

## 2013-03-07 DIAGNOSIS — Z171 Estrogen receptor negative status [ER-]: Secondary | ICD-10-CM | POA: Insufficient documentation

## 2013-03-07 DIAGNOSIS — C50019 Malignant neoplasm of nipple and areola, unspecified female breast: Principal | ICD-10-CM | POA: Insufficient documentation

## 2013-03-07 DIAGNOSIS — D0512 Intraductal carcinoma in situ of left breast: Secondary | ICD-10-CM | POA: Insufficient documentation

## 2013-03-07 DIAGNOSIS — K589 Irritable bowel syndrome without diarrhea: Secondary | ICD-10-CM | POA: Diagnosis not present

## 2013-03-07 DIAGNOSIS — D0510 Intraductal carcinoma in situ of unspecified breast: Secondary | ICD-10-CM

## 2013-03-07 DIAGNOSIS — K219 Gastro-esophageal reflux disease without esophagitis: Secondary | ICD-10-CM | POA: Diagnosis not present

## 2013-03-07 DIAGNOSIS — D0590 Unspecified type of carcinoma in situ of unspecified breast: Secondary | ICD-10-CM | POA: Insufficient documentation

## 2013-03-07 DIAGNOSIS — I10 Essential (primary) hypertension: Secondary | ICD-10-CM | POA: Diagnosis not present

## 2013-03-07 DIAGNOSIS — C50919 Malignant neoplasm of unspecified site of unspecified female breast: Secondary | ICD-10-CM | POA: Diagnosis not present

## 2013-03-07 DIAGNOSIS — C50012 Malignant neoplasm of nipple and areola, left female breast: Secondary | ICD-10-CM

## 2013-03-07 HISTORY — DX: Intraductal carcinoma in situ of unspecified breast: D05.10

## 2013-03-07 HISTORY — DX: Malignant (primary) neoplasm, unspecified: C80.1

## 2013-03-07 HISTORY — PX: SIMPLE MASTECTOMY WITH AXILLARY SENTINEL NODE BIOPSY: SHX6098

## 2013-03-07 LAB — GLUCOSE, CAPILLARY: Glucose-Capillary: 126 mg/dL — ABNORMAL HIGH (ref 70–99)

## 2013-03-07 LAB — CREATININE, SERUM
Creatinine, Ser: 0.76 mg/dL (ref 0.50–1.10)
GFR calc non Af Amer: 79 mL/min — ABNORMAL LOW (ref >90)

## 2013-03-07 LAB — CBC
Hemoglobin: 13.3 g/dL (ref 12.0–15.0)
MCH: 30.2 pg (ref 26.0–34.0)
MCHC: 34.1 g/dL (ref 30.0–36.0)
MCV: 88.4 fL (ref 78.0–100.0)
RDW: 12.7 % (ref 11.5–15.5)

## 2013-03-07 SURGERY — SIMPLE MASTECTOMY WITH AXILLARY SENTINEL NODE BIOPSY
Anesthesia: General | Site: Breast | Laterality: Left | Wound class: Clean

## 2013-03-07 MED ORDER — INFLUENZA VAC SPLIT QUAD 0.5 ML IM SUSP
0.5000 mL | INTRAMUSCULAR | Status: AC
  Administered 2013-03-08: 0.5 mL via INTRAMUSCULAR
  Filled 2013-03-07: qty 0.5

## 2013-03-07 MED ORDER — PROPOFOL 10 MG/ML IV BOLUS
INTRAVENOUS | Status: DC | PRN
  Administered 2013-03-07: 100 mg via INTRAVENOUS

## 2013-03-07 MED ORDER — POTASSIUM CHLORIDE IN NACL 20-0.9 MEQ/L-% IV SOLN
INTRAVENOUS | Status: DC
  Administered 2013-03-07 – 2013-03-08 (×2): via INTRAVENOUS
  Filled 2013-03-07 (×4): qty 1000

## 2013-03-07 MED ORDER — METHYLENE BLUE 1 % INJ SOLN
INTRAMUSCULAR | Status: AC
  Filled 2013-03-07: qty 10

## 2013-03-07 MED ORDER — PROMETHAZINE HCL 25 MG/ML IJ SOLN: 6.2500 mg | INTRAMUSCULAR | Status: DC | PRN

## 2013-03-07 MED ORDER — OXYCODONE HCL 5 MG/5ML PO SOLN: 5.0000 mg | Freq: Once | ORAL | Status: DC | PRN

## 2013-03-07 MED ORDER — MORPHINE SULFATE 2 MG/ML IJ SOLN: 2.0000 mg | INTRAMUSCULAR | Status: DC | PRN

## 2013-03-07 MED ORDER — SODIUM CHLORIDE 0.9 % IJ SOLN
INTRAMUSCULAR | Status: DC | PRN
  Administered 2013-03-07: 08:00:00

## 2013-03-07 MED ORDER — ONDANSETRON HCL 4 MG/2ML IJ SOLN
INTRAMUSCULAR | Status: DC | PRN
  Administered 2013-03-07: 4 mg via INTRAMUSCULAR

## 2013-03-07 MED ORDER — TECHNETIUM TC 99M SULFUR COLLOID FILTERED
1.0000 | Freq: Once | INTRAVENOUS | Status: AC | PRN
  Administered 2013-03-07: 1 via INTRADERMAL

## 2013-03-07 MED ORDER — LEVOTHYROXINE SODIUM 50 MCG PO TABS
50.0000 ug | ORAL_TABLET | Freq: Every day | ORAL | Status: DC
  Administered 2013-03-08: 50 ug via ORAL
  Filled 2013-03-07 (×2): qty 1

## 2013-03-07 MED ORDER — ALPRAZOLAM 0.5 MG PO TABS: 0.5000 mg | ORAL_TABLET | Freq: Two times a day (BID) | ORAL | Status: DC | PRN

## 2013-03-07 MED ORDER — MIDAZOLAM HCL 5 MG/5ML IJ SOLN
INTRAMUSCULAR | Status: DC | PRN
  Administered 2013-03-07: 1 mg via INTRAVENOUS

## 2013-03-07 MED ORDER — FENTANYL CITRATE 0.05 MG/ML IJ SOLN
25.0000 ug | INTRAMUSCULAR | Status: DC | PRN
  Administered 2013-03-07: 25 ug via INTRAVENOUS

## 2013-03-07 MED ORDER — ONDANSETRON HCL 4 MG/2ML IJ SOLN: 4.0000 mg | Freq: Four times a day (QID) | INTRAMUSCULAR | Status: DC | PRN

## 2013-03-07 MED ORDER — PANTOPRAZOLE SODIUM 40 MG PO TBEC
40.0000 mg | DELAYED_RELEASE_TABLET | Freq: Every day | ORAL | Status: DC
  Administered 2013-03-07: 40 mg via ORAL
  Filled 2013-03-07: qty 1

## 2013-03-07 MED ORDER — CHOLESTYRAMINE 4 GM/DOSE PO POWD: 4.0000 g | Freq: Two times a day (BID) | ORAL | Status: DC | PRN

## 2013-03-07 MED ORDER — CITALOPRAM HYDROBROMIDE 20 MG PO TABS
20.0000 mg | ORAL_TABLET | Freq: Every day | ORAL | Status: DC
  Filled 2013-03-07: qty 1

## 2013-03-07 MED ORDER — SODIUM CHLORIDE 0.9 % IJ SOLN
INTRAMUSCULAR | Status: AC
  Filled 2013-03-07: qty 10

## 2013-03-07 MED ORDER — CHOLESTYRAMINE LIGHT 4 G PO PACK
4.0000 g | PACK | Freq: Two times a day (BID) | ORAL | Status: DC | PRN
  Filled 2013-03-07: qty 1

## 2013-03-07 MED ORDER — LACTATED RINGERS IV SOLN
INTRAVENOUS | Status: DC | PRN
  Administered 2013-03-07 (×2): via INTRAVENOUS

## 2013-03-07 MED ORDER — MIDAZOLAM HCL 2 MG/2ML IJ SOLN: 0.5000 mg | Freq: Once | INTRAMUSCULAR | Status: DC | PRN

## 2013-03-07 MED ORDER — EPHEDRINE SULFATE 50 MG/ML IJ SOLN
INTRAMUSCULAR | Status: DC | PRN
  Administered 2013-03-07: 5 mg via INTRAVENOUS

## 2013-03-07 MED ORDER — CYCLOSPORINE 0.05 % OP EMUL
1.0000 [drp] | Freq: Two times a day (BID) | OPHTHALMIC | Status: DC
  Administered 2013-03-07: 1 [drp] via OPHTHALMIC
  Filled 2013-03-07 (×4): qty 1

## 2013-03-07 MED ORDER — LOSARTAN POTASSIUM 50 MG PO TABS
50.0000 mg | ORAL_TABLET | Freq: Every day | ORAL | Status: DC
  Administered 2013-03-07: 50 mg via ORAL
  Filled 2013-03-07 (×2): qty 1

## 2013-03-07 MED ORDER — OXYCODONE HCL 5 MG PO TABS
5.0000 mg | ORAL_TABLET | Freq: Once | ORAL | Status: DC | PRN
Start: 2013-03-07 — End: 2013-03-07

## 2013-03-07 MED ORDER — ATORVASTATIN CALCIUM 10 MG PO TABS
10.0000 mg | ORAL_TABLET | Freq: Every day | ORAL | Status: DC
  Administered 2013-03-07: 10 mg via ORAL
  Filled 2013-03-07 (×2): qty 1

## 2013-03-07 MED ORDER — ONDANSETRON HCL 4 MG PO TABS: 4.0000 mg | ORAL_TABLET | Freq: Four times a day (QID) | ORAL | Status: DC | PRN

## 2013-03-07 MED ORDER — FENTANYL CITRATE 0.05 MG/ML IJ SOLN
INTRAMUSCULAR | Status: DC | PRN
  Administered 2013-03-07: 25 ug via INTRAVENOUS
  Administered 2013-03-07: 100 ug via INTRAVENOUS
  Administered 2013-03-07: 25 ug via INTRAVENOUS
  Administered 2013-03-07: 50 ug via INTRAVENOUS

## 2013-03-07 MED ORDER — FENTANYL CITRATE 0.05 MG/ML IJ SOLN
INTRAMUSCULAR | Status: AC
  Filled 2013-03-07: qty 2

## 2013-03-07 MED ORDER — ENOXAPARIN SODIUM 40 MG/0.4ML ~~LOC~~ SOLN
40.0000 mg | SUBCUTANEOUS | Status: DC
  Administered 2013-03-08: 40 mg via SUBCUTANEOUS
  Filled 2013-03-07 (×2): qty 0.4

## 2013-03-07 MED ORDER — OXYCODONE-ACETAMINOPHEN 5-325 MG PO TABS
1.0000 | ORAL_TABLET | ORAL | Status: DC | PRN
  Administered 2013-03-07: 2 via ORAL
  Filled 2013-03-07: qty 2

## 2013-03-07 MED ORDER — LIDOCAINE HCL (CARDIAC) 20 MG/ML IV SOLN
INTRAVENOUS | Status: DC | PRN
  Administered 2013-03-07: 30 mg via INTRAVENOUS

## 2013-03-07 MED ORDER — MEPERIDINE HCL 25 MG/ML IJ SOLN: 6.2500 mg | INTRAMUSCULAR | Status: DC | PRN

## 2013-03-07 MED ORDER — ARTIFICIAL TEARS OP OINT
TOPICAL_OINTMENT | OPHTHALMIC | Status: DC | PRN
  Administered 2013-03-07: 1 via OPHTHALMIC

## 2013-03-07 SURGICAL SUPPLY — 48 items
APPLIER CLIP 9.375 MED OPEN (MISCELLANEOUS) ×2
BINDER BREAST LRG (GAUZE/BANDAGES/DRESSINGS) IMPLANT
BINDER BREAST MEDIUM (GAUZE/BANDAGES/DRESSINGS) ×2 IMPLANT
BINDER BREAST XLRG (GAUZE/BANDAGES/DRESSINGS) IMPLANT
CANISTER SUCTION 2500CC (MISCELLANEOUS) ×2 IMPLANT
CHLORAPREP W/TINT 26ML (MISCELLANEOUS) ×2 IMPLANT
CLIP APPLIE 9.375 MED OPEN (MISCELLANEOUS) ×1 IMPLANT
CLOTH BEACON ORANGE TIMEOUT ST (SAFETY) ×2 IMPLANT
CONT SPEC 4OZ CLIKSEAL STRL BL (MISCELLANEOUS) ×2 IMPLANT
COVER PROBE W GEL 5X96 (DRAPES) ×2 IMPLANT
COVER SURGICAL LIGHT HANDLE (MISCELLANEOUS) ×2 IMPLANT
DERMABOND ADVANCED (GAUZE/BANDAGES/DRESSINGS) ×1
DERMABOND ADVANCED .7 DNX12 (GAUZE/BANDAGES/DRESSINGS) ×1 IMPLANT
DRAIN CHANNEL 19F RND (DRAIN) ×2 IMPLANT
DRAPE CHEST BREAST 15X10 FENES (DRAPES) ×2 IMPLANT
DRAPE UTILITY 15X26 W/TAPE STR (DRAPE) ×4 IMPLANT
DRSG PAD ABDOMINAL 8X10 ST (GAUZE/BANDAGES/DRESSINGS) ×2 IMPLANT
ELECT BLADE 4.0 EZ CLEAN MEGAD (MISCELLANEOUS) ×2
ELECT CAUTERY BLADE 6.4 (BLADE) ×2 IMPLANT
ELECT REM PT RETURN 9FT ADLT (ELECTROSURGICAL) ×2
ELECTRODE BLDE 4.0 EZ CLN MEGD (MISCELLANEOUS) ×1 IMPLANT
ELECTRODE REM PT RTRN 9FT ADLT (ELECTROSURGICAL) ×1 IMPLANT
EVACUATOR SILICONE 100CC (DRAIN) ×2 IMPLANT
GLOVE BIOGEL PI IND STRL 7.0 (GLOVE) ×1 IMPLANT
GLOVE BIOGEL PI INDICATOR 7.0 (GLOVE) ×1
GLOVE EUDERMIC 7 POWDERFREE (GLOVE) ×2 IMPLANT
GLOVE SURG SS PI 7.0 STRL IVOR (GLOVE) ×2 IMPLANT
GOWN STRL NON-REIN LRG LVL3 (GOWN DISPOSABLE) ×4 IMPLANT
GOWN STRL REIN XL XLG (GOWN DISPOSABLE) ×2 IMPLANT
KIT BASIN OR (CUSTOM PROCEDURE TRAY) ×2 IMPLANT
KIT ROOM TURNOVER OR (KITS) ×2 IMPLANT
NEEDLE 18GX1X1/2 (RX/OR ONLY) (NEEDLE) ×2 IMPLANT
NEEDLE HYPO 25GX1X1/2 BEV (NEEDLE) ×2 IMPLANT
NS IRRIG 1000ML POUR BTL (IV SOLUTION) ×2 IMPLANT
PACK GENERAL/GYN (CUSTOM PROCEDURE TRAY) ×2 IMPLANT
PAD ARMBOARD 7.5X6 YLW CONV (MISCELLANEOUS) ×2 IMPLANT
SPECIMEN JAR X LARGE (MISCELLANEOUS) ×2 IMPLANT
SPONGE GAUZE 4X4 12PLY (GAUZE/BANDAGES/DRESSINGS) ×4 IMPLANT
STAPLER VISISTAT 35W (STAPLE) ×2 IMPLANT
SUT ETHILON 3 0 FSL (SUTURE) ×2 IMPLANT
SUT MNCRL AB 4-0 PS2 18 (SUTURE) ×2 IMPLANT
SUT SILK 2 0 FS (SUTURE) ×2 IMPLANT
SUT VIC AB 3-0 SH 18 (SUTURE) ×2 IMPLANT
SYR CONTROL 10ML LL (SYRINGE) ×2 IMPLANT
TAPE CLOTH SURG 4X10 WHT LF (GAUZE/BANDAGES/DRESSINGS) ×4 IMPLANT
TOWEL OR 17X24 6PK STRL BLUE (TOWEL DISPOSABLE) ×2 IMPLANT
TOWEL OR 17X26 10 PK STRL BLUE (TOWEL DISPOSABLE) ×2 IMPLANT
WATER STERILE IRR 1000ML POUR (IV SOLUTION) IMPLANT

## 2013-03-07 NOTE — Anesthesia Preprocedure Evaluation (Addendum)
Anesthesia Evaluation  Patient identified by MRN, date of birth, ID band Patient awake    Reviewed: Allergy & Precautions, H&P , NPO status , Patient's Chart, lab work & pertinent test results, reviewed documented beta blocker date and time   History of Anesthesia Complications Negative for: history of anesthetic complications  Airway Mallampati: II TM Distance: >3 FB Neck ROM: Full    Dental  (+) Teeth Intact and Dental Advisory Given   Pulmonary neg pulmonary ROS,  breath sounds clear to auscultation  Pulmonary exam normal       Cardiovascular hypertension, Pt. on medications Rhythm:Regular Rate:Normal     Neuro/Psych Anxiety CVA, No Residual Symptoms    GI/Hepatic Neg liver ROS, GERD-  Medicated and Controlled,  Endo/Other  diabetes (diet controlled, glu 110), Well Controlled, Type 2Hypothyroidism   Renal/GU negative Renal ROS     Musculoskeletal   Abdominal   Peds  Hematology negative hematology ROS (+)   Anesthesia Other Findings   Reproductive/Obstetrics                          Anesthesia Physical Anesthesia Plan  ASA: III  Anesthesia Plan: General   Post-op Pain Management:    Induction: Intravenous  Airway Management Planned: LMA  Additional Equipment:   Intra-op Plan:   Post-operative Plan: Extubation in OR  Informed Consent: I have reviewed the patients History and Physical, chart, labs and discussed the procedure including the risks, benefits and alternatives for the proposed anesthesia with the patient or authorized representative who has indicated his/her understanding and acceptance.   Dental advisory given  Plan Discussed with: CRNA, Anesthesiologist and Surgeon  Anesthesia Plan Comments: (Plan routine monitors, GA- LMA OK)      Anesthesia Quick Evaluation

## 2013-03-07 NOTE — Transfer of Care (Signed)
Immediate Anesthesia Transfer of Care Note  Patient: Angela Galvan  Procedure(s) Performed: Procedure(s): TOTAL  MASTECTOMY WITH AXILLARY SENTINEL NODE BIOPSY (Left)  Patient Location: PACU  Anesthesia Type:General  Level of Consciousness: awake, alert  and oriented  Airway & Oxygen Therapy: Patient Spontanous Breathing and Patient connected to nasal cannula oxygen  Post-op Assessment: Report given to PACU RN  Post vital signs: Reviewed and stable  Complications: No apparent anesthesia complications

## 2013-03-07 NOTE — Anesthesia Postprocedure Evaluation (Signed)
  Anesthesia Post-op Note  Patient: Angela Galvan  Procedure(s) Performed: Procedure(s): TOTAL  MASTECTOMY WITH AXILLARY SENTINEL NODE BIOPSY (Left)  Patient Location: PACU  Anesthesia Type:General  Level of Consciousness: awake, alert , oriented and patient cooperative  Airway and Oxygen Therapy: Patient Spontanous Breathing and Patient connected to nasal cannula oxygen  Post-op Pain: none  Post-op Assessment: Post-op Vital signs reviewed, Patient's Cardiovascular Status Stable, Respiratory Function Stable, Patent Airway, No signs of Nausea or vomiting and Pain level controlled  Post-op Vital Signs: Reviewed and stable  Complications: No apparent anesthesia complications

## 2013-03-07 NOTE — Op Note (Signed)
Patient Name:           Angela Galvan   Date of Surgery:        03/07/2013  Pre op Diagnosis:      Paget's disease of nipple, left breast, with high-grade ductal carcinoma in situ  Post op Diagnosis:    same  Procedure:                 Left total mastectomy, inject blue dye left breast, and left axillary sentinel node biopsy  Surgeon:                     [name]. Derrell Lolling, M.D., FACS  Assistant:                      Axel Filler, M.D.  Operative Indications:   Angela Galvan is a 79 y.o. female. Marland Kitchen  She was initially diagnosed with Paget's disease of the left nipple and high-grade DCIS on nipple biopsy. Mammogram showed no other abnormality. She was interested whether she would need a mastectomy or lumpectomy.  Because of the risk of a second malignancy within the breast and Paget's disease, we did an MRI. There was a linear area of enhancement extending from the nipple area straight back approximately 60-70% of the anterior to posterior dimension of the left breast. Image guided biopsy of the middle area of this showed ductal carcinoma in situ with necrosis. .  I spent a long time talking with the patient, her husband, and her daughter today. I told her that we could consider MR wire localization of the most posterior aspect of this and a central lumpectomy. Because of the high-grade nature of this a sentinel node biopsy is probably a good idea, although somewhat controversial at her age. Breast diagnostic profile is still pending, as she knows that she may need antiestrogen therapy. She was offered a second opinion with a medical oncologist and declined at that preop. She understands that she will need radiation therapy if she has a lumpectomy. She understands that there is a possibility of positive margins requiring reexcision. At the end of the discussion she was more comfortable with a left simple mastectomy and sentinel node biopsy.    Operative Findings:       The left breast and left  breast skin looked normal. The nipple biopsy area had healed without problems. I found a single sentinel lymph node,  very hot and very blue and after this was removed there was almost no radioactivity in the axilla.  Procedure in Detail:          The patient underwent injection of radionuclide in the left breast in the holding area by the nuclear medicine technician. She was brought to the operating room where general LMA anesthesia was induced. Following a timeout an alcohol prep I injected 5 cc of blue dye in the left breast. This was 2 cc of methylene blue mixed with 3 cc of saline. The breast was then massaged. Intravenous antibiotics were given. The left breast was prepped and draped in a sterile fashion. Another surgical time out was performed. Using a marking pen I planned and marked a transverse elliptical incision. After I planned the incision I made an elliptical incision with a knife. Skin flaps were raised superiorly to the infraclavicular area, medially to the parasternal area, inferiorly to the anterior rectus sheath and laterally to latissimus dorsi muscle. The breast was then dissected off of the  pectoralis major and minor muscles with electrocautery. The lateral skin margin was marked with a silk suture. I carried the dissection up into the level I axilla. Using the neoprobe I found a single very hot very blue sentinel node and removed that. There was no other radioactivity so I did not do any further axillary dissection. I dissected out the tail of Spence. I marked the lateral skin margin with a silk suture and sent the breast specimen the lab. Hemostasis was excellent and achieved with electrocautery. The wound was copiously irrigated. A 19 [place] Blake drain was placed across the skin flaps and down into the axillary recess. This was brought out through a separate stab incision inferior laterally, sutured to skin with a nylon suture and connected to suction bulb. The mastectomy incision looked  good. Subcutaneous tissue was closed with interrupted 3-0 Vicryl and skin closed with a running subcuticular suture of 4-0 Monocryl and Dermabond. The drain was connected to suction and held the charge very nicely. Breast binder was placed. The patient tolerated the procedure well and was taken recovery in stable condition. EBL 50 cc. Counts correct. Complications none.     [name]. Derrell Lolling, M.D., FACS General and Minimally Invasive Surgery Breast and Colorectal Surgery  03/07/2013 8:42 AM

## 2013-03-07 NOTE — Interval H&P Note (Signed)
History and Physical Interval Note:  03/07/2013 6:28 AM  Angela Galvan  has presented today for surgery, with the diagnosis of left breast cancer  The goals and the various methods of treatment have been discussed with the patient and family. After consideration of risks, benefits and other options for treatment, the patient has consented to  Procedure(s): TOTAL  MASTECTOMY WITH AXILLARY SENTINEL NODE BIOPSY (Left) as a surgical intervention .  The patient's history has been reviewed, patient examined, no change in status, stable for surgery.  I have reviewed the patient's chart and labs.  Questions were answered to the patient's satisfaction.     Ernestene Mention

## 2013-03-07 NOTE — Preoperative (Signed)
Beta Blockers   Reason not to administer Beta Blockers:Not Applicable 

## 2013-03-07 NOTE — Progress Notes (Signed)
UR completed.  Dayzee Trower, RN BSN [facility] CCM Trauma/Neuro ICU Case Manager [phone]  

## 2013-03-08 DIAGNOSIS — C50019 Malignant neoplasm of nipple and areola, unspecified female breast: Secondary | ICD-10-CM | POA: Diagnosis not present

## 2013-03-08 DIAGNOSIS — Z171 Estrogen receptor negative status [ER-]: Secondary | ICD-10-CM | POA: Diagnosis not present

## 2013-03-08 DIAGNOSIS — E119 Type 2 diabetes mellitus without complications: Secondary | ICD-10-CM | POA: Diagnosis not present

## 2013-03-08 DIAGNOSIS — I10 Essential (primary) hypertension: Secondary | ICD-10-CM | POA: Diagnosis not present

## 2013-03-08 DIAGNOSIS — D0590 Unspecified type of carcinoma in situ of unspecified breast: Secondary | ICD-10-CM | POA: Diagnosis not present

## 2013-03-08 DIAGNOSIS — Z79899 Other long term (current) drug therapy: Secondary | ICD-10-CM | POA: Diagnosis not present

## 2013-03-08 MED ORDER — OXYCODONE-ACETAMINOPHEN 5-325 MG PO TABS: 1.0000 | ORAL_TABLET | ORAL | Status: DC | PRN

## 2013-03-08 NOTE — Discharge Summary (Signed)
Patient ID: Angela Galvan MRN: [id number] DOB/AGE: 1932-06-19 79 y.o.  Admit date: 03/07/2013 Discharge date: 03/08/2013  Procedures: Left total mastectomy, inject blue dye left breast, and left axillary sentinel node biopsy  Consults: None  Reason for Admission: Angela Galvan has presented today for surgery, with the diagnosis of left breast cancer   Admission Diagnoses:  1. Breast cancer  Hospital Course: The patient was taken to the operating room where she underwent the above stated procedure.  She tolerated this well.  On POD#1, her pain was well controlled and she was tolerating a regular diet.  She was otherwise stable for dc home.  PE: Chest: left breast incision is c/d/i.  JP drain with minimal serosang output.  Charge held.  Discharge Diagnoses:  Active Problems:   Paget's disease of left breast s/p left mastectomy and sentinel node bx  Discharge Medications:   Medication List         acetaminophen 500 MG tablet  Commonly known as:  TYLENOL  - Take 500-1,000 mg by mouth every 6 (six) hours as needed. Pain  -      ALPRAZolam 0.5 MG tablet  Commonly known as:  XANAX  Take 0.5 mg by mouth 2 (two) times daily as needed for sleep or anxiety.     aspirin 325 MG tablet  Take 325 mg by mouth daily.     atorvastatin 10 MG tablet  Commonly known as:  LIPITOR  Take 10 mg by mouth daily after breakfast.     cholestyramine 4 GM/DOSE powder  Commonly known as:  QUESTRAN  Take 1 to 2 grams twice daily as directed.     citalopram 20 MG tablet  Commonly known as:  CELEXA  Take 20 mg by mouth daily after breakfast.     HYDROcodone-acetaminophen 5-325 MG per tablet  Commonly known as:  NORCO/VICODIN  Take 0.5-1 tablets by mouth every 6 (six) hours as needed for pain.     levothyroxine 50 MCG tablet  Commonly known as:  SYNTHROID, LEVOTHROID  Take 50 mcg by mouth daily before breakfast.     losartan 50 MG tablet  Commonly known as:  COZAAR  Take 50 mg by mouth  daily.     oxyCODONE-acetaminophen 5-325 MG per tablet  Commonly known as:  PERCOCET/ROXICET  Take 1-2 tablets by mouth every 4 (four) hours as needed.     RABEprazole 20 MG tablet  Commonly known as:  ACIPHEX  Take 1 tablet (20 mg total) by mouth daily.     RESTASIS 0.05 % ophthalmic emulsion  Generic drug:  cycloSPORINE  Place 1 drop into both eyes every 12 (twelve) hours.        Discharge Instructions:     Follow-up Information   Follow up with Ernestene Mention, MD. Schedule an appointment as soon as possible for a visit in 9 days.   Specialty:  General Surgery   Contact information:   [address] [place] [address] [phone]       Signed: Letha Cape 03/08/2013, 8:38 AM

## 2013-03-08 NOTE — Progress Notes (Signed)
Discharge home. Home discharge instruction given, no question verbalized. 

## 2013-03-08 NOTE — Discharge Summary (Signed)
Jovon Winterhalter, MD, MPH, FACS Pager: [phone]  

## 2013-03-10 ENCOUNTER — Telehealth (INDEPENDENT_AMBULATORY_CARE_PROVIDER_SITE_OTHER): Payer: Self-pay

## 2013-03-10 NOTE — Telephone Encounter (Signed)
Message copied by Ivory Broad on Mon Mar 10, 2013  9:18 AM ------      Message from: Ernestene Mention      Created: Fri Mar 07, 2013  9:08 AM       Huntley Dec,      Mastectomy today. Probably home Sat.            Has appt. Oct. 27, she says. Probably needs appt., nurse only in 7-10 days for wound and drain check.            Do not remove drain unless less than 15 cc/day for two days in a row,.            Thanks.            hmi ------

## 2013-03-10 NOTE — Telephone Encounter (Signed)
I called and scheduled the pt to come in for nurse visit on 03/17/2013 at 2 pm.

## 2013-03-11 ENCOUNTER — Telehealth (INDEPENDENT_AMBULATORY_CARE_PROVIDER_SITE_OTHER): Payer: Self-pay

## 2013-03-11 ENCOUNTER — Encounter (HOSPITAL_COMMUNITY): Payer: Self-pay | Admitting: General Surgery

## 2013-03-11 NOTE — Telephone Encounter (Signed)
Message copied by Ivory Broad on Tue Mar 11, 2013  2:07 PM ------      Message from: Ernestene Mention      Created: Mon Mar 10, 2013  5:40 PM       Huntley Dec,      Call pathology report to Ms. Reitano..  All DCIS, negative margins, negative nodes. Good News.  Will discuss more detail at next OV.            HMI ------

## 2013-03-11 NOTE — Telephone Encounter (Signed)
I called the pt and let her know the pathology results below.  She asked where to get a breast binder like she got from the surgical center.  I don't know exactly where to get those so I told her about 2nd to [place].  They have post mastectomy supplies.

## 2013-03-17 ENCOUNTER — Ambulatory Visit (INDEPENDENT_AMBULATORY_CARE_PROVIDER_SITE_OTHER): Payer: Medicare Other

## 2013-03-17 DIAGNOSIS — Z4803 Encounter for change or removal of drains: Secondary | ICD-10-CM

## 2013-03-17 NOTE — Progress Notes (Signed)
Pt comes in today for drain and incision check.  Her incision looks really good.  Her drain has a current output of 20-30cc's per day.  Per Dr. Jacinto Halim note I did not pull drain. Dr. Derrell Lolling wishes drain to have less that 15cc's output 2 days in a row.  I relayed this to the pt and told her to call our office when the output is around 15cc's and she can make a nurse only appointment then.  Pt and family verbalized understanding.

## 2013-03-24 ENCOUNTER — Ambulatory Visit (INDEPENDENT_AMBULATORY_CARE_PROVIDER_SITE_OTHER): Payer: Medicare Other | Admitting: General Surgery

## 2013-03-24 ENCOUNTER — Encounter (INDEPENDENT_AMBULATORY_CARE_PROVIDER_SITE_OTHER): Payer: Self-pay | Admitting: General Surgery

## 2013-03-24 VITALS — BP 110/62 | HR 68 | Temp 97.8°F | Resp 14 | Ht 60.0 in | Wt 117.0 lb

## 2013-03-24 DIAGNOSIS — C50012 Malignant neoplasm of nipple and areola, left female breast: Secondary | ICD-10-CM

## 2013-03-24 DIAGNOSIS — C50919 Malignant neoplasm of unspecified site of unspecified female breast: Secondary | ICD-10-CM

## 2013-03-24 NOTE — Patient Instructions (Signed)
You are recovering from your left mastectomy and sentinel node biopsy without any obvious surgical complications. We removed the drain today.  Continue to exercise and stretch the left shoulder.  You may begin driving your car within a few days  You may start taking a shower tomorrow  You will be referred to a medical oncologist for their opinion  Return to see Dr. Derrell Lolling in 6-8 weeks.

## 2013-03-24 NOTE — Progress Notes (Signed)
Patient ID: Suanne B Schriefer, female   DOB: 1932/09/29, 79 y.o.   MRN: [id number] History: This patient underwent left total mastectomy and sentinel node biopsy on 03/07/2013. Final pathology showed Paget's disease of the nipple and multiple foci of high-grade DCIS which are receptor negative. Final staging is Tis, N0.She has done well. The drainage is almost stopped. She has minimal discomfort.  Exam: Patient is well. Husband and daughter with her. Left mastectomy wound healing normally. No infection. No hematoma. Range of motion left shoulder is actually excellent. Drain removed and redressed.  assessment: Multifocal, high-grade DCIS left breast, ER/PR neg.,  with associated Paget's disease of the nipple. Recovering uneventfully following left mastectomy and sentinel node biopsy History CVA without residual  Diet-controlled diabetes  Hypertension  Plan: Prescription for postmastectomy bra and prosthesis given to patient She'll be referred to a medical oncologist for their opinion, although there may not be any role for adjuvant therapies. Return to see me in 6-8 weeks Reexamine in 6 months Annual mammography and breast exam. Activities discussed.   [name]. Derrell Lolling, M.D., [facility] General and Minimally invasive Surgery Breast and Colorectal Surgery Office:   [phone] Pager:   [phone]

## 2013-03-26 ENCOUNTER — Telehealth: Payer: Self-pay | Admitting: *Deleted

## 2013-03-26 NOTE — Telephone Encounter (Signed)
Confirmed 03/31/13 appt w/ pt.  Mailed before appt letter & packet to pt. Emailed Huntley Dec at [facility] to make her aware.  Took paperwork to Med [facility] for chart.

## 2013-03-28 ENCOUNTER — Other Ambulatory Visit: Payer: Self-pay | Admitting: Emergency Medicine

## 2013-03-28 DIAGNOSIS — C50012 Malignant neoplasm of nipple and areola, left female breast: Secondary | ICD-10-CM

## 2013-03-31 ENCOUNTER — Encounter: Payer: Self-pay | Admitting: Oncology

## 2013-03-31 ENCOUNTER — Telehealth: Payer: Self-pay | Admitting: Oncology

## 2013-03-31 ENCOUNTER — Ambulatory Visit (HOSPITAL_BASED_OUTPATIENT_CLINIC_OR_DEPARTMENT_OTHER): Payer: Medicare Other | Admitting: Oncology

## 2013-03-31 ENCOUNTER — Ambulatory Visit: Payer: Medicare Other

## 2013-03-31 ENCOUNTER — Other Ambulatory Visit (HOSPITAL_BASED_OUTPATIENT_CLINIC_OR_DEPARTMENT_OTHER): Payer: Medicare Other | Admitting: Lab

## 2013-03-31 VITALS — BP 120/69 | HR 69 | Temp 98.3°F | Resp 20 | Ht 60.0 in | Wt 118.7 lb

## 2013-03-31 DIAGNOSIS — D0590 Unspecified type of carcinoma in situ of unspecified breast: Secondary | ICD-10-CM

## 2013-03-31 DIAGNOSIS — D0512 Intraductal carcinoma in situ of left breast: Secondary | ICD-10-CM

## 2013-03-31 DIAGNOSIS — C50012 Malignant neoplasm of nipple and areola, left female breast: Secondary | ICD-10-CM

## 2013-03-31 LAB — CBC WITH DIFFERENTIAL/PLATELET
Basophils Absolute: 0.1 10*3/uL (ref 0.0–0.1)
EOS%: 2.6 % (ref 0.0–7.0)
HCT: 39.9 % (ref 34.8–46.6)
HGB: 13.2 g/dL (ref 11.6–15.9)
LYMPH%: 36 % (ref 14.0–49.7)
MCH: 29.3 pg (ref 25.1–34.0)
MONO#: 0.9 10*3/uL (ref 0.1–0.9)
NEUT%: 50.3 % (ref 38.4–76.8)
Platelets: 359 10*3/uL (ref 145–400)
WBC: 8.6 10*3/uL (ref 3.9–10.3)
lymph#: 3.1 10*3/uL (ref 0.9–3.3)

## 2013-03-31 LAB — COMPREHENSIVE METABOLIC PANEL (CC13)
AST: 23 U/L (ref 5–34)
Alkaline Phosphatase: 89 U/L (ref 40–150)
Anion Gap: 11 mEq/L (ref 3–11)
BUN: 16.8 mg/dL (ref 7.0–26.0)
CO2: 21 mEq/L — ABNORMAL LOW (ref 22–29)
Calcium: 9.6 mg/dL (ref 8.4–10.4)
Chloride: 110 mEq/L — ABNORMAL HIGH (ref 98–109)
Creatinine: 1 mg/dL (ref 0.6–1.1)
Total Bilirubin: 0.8 mg/dL (ref 0.20–1.20)

## 2013-03-31 NOTE — Progress Notes (Signed)
Checked in new patient with no finanacial issues. She gave copy of living will.

## 2013-03-31 NOTE — Telephone Encounter (Signed)
, °

## 2013-03-31 NOTE — Progress Notes (Signed)
Angela Galvan [id number] 04-06-1933 79 y.o. 03/31/2013 2:06 PM  CC  [name], MD [address] [place] [address] Dr. 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 Patient is seen in medical oncology for discussion of adjuvant treatment options   STAGE:  Tis N0 (stage 0) DCIS, left breast ER 0%, PR 0% Grade 3    REFERRING PHYSICIAN: Dr. Claud Kelp  HISTORY OF PRESENT ILLNESS:  Angela Galvan is a 79 y.o. female.  Would medical history significant for diabetes and hypertension. Patient's began having drainage in the left nipple in April 2014. She went on to have a mammogram performed on 11/13/2012. There were no suspicious masses or calcifications in either breast. On exam patient at the time did have erythematous nipple but no ulcerations. She was recommended a biopsy of the nipple. On 01/10/2013 patient had biopsy of the left nipple and an area by Dr. Franky Macho. The pathology revealed Paget's disease of the nipple and high-grade DCIS DER receptor and PR receptors were negative. She was recommended a left mastectomy with sentinel lymph node biopsy by Dr. Lovell Sheehan. But patient opted for a second opinion from Dr. Claud Kelp. She saw Dr. Derrell Lolling on 01/24/2013. He recommended doing an MRI of the breasts. She had the MRI performed on 02/11/2013. There was noted to be a linear clumped enhancement in the ductal distribution along the posterior margin of the biopsy cavity in the left breast at the 3:00 slightly lateral to the nipple and extending posteriorly 45.4 cm. Appearance highly suspicious for ductal carcinoma in situ. Was recommended further biopsies and she underwent MRI guided wire biopsy performed on 02/18/2013 the findings were consistent with DCIS. Patient therefore did undergo a mastectomy with sentinel lymph  node biopsy on 03/07/2013. Her final pathology revealed 3.5 cm DCIS, high-grade, ER negative PR negative. There was no invasive disease identified. One Sentinel node was negative for metastatic disease. Postoperatively patient is doing well. She is now seen in medical oncology for consideration of adjuvant therapy. Her surgical site is healed well. She otherwise is without any other complaints related to her breast.   Past Medical History: Past Medical History  Diagnosis Date  . Diverticulitis     s/p colectomy in 1991  . IBS (irritable bowel syndrome)   . Hyperlipidemia   . S/P colonoscopy 2003    minimal internal hemorrhoids, pancolonic diverticula, biopsy of rectum: lymphoplasmacytic microscopic colitis, colon biopsies normal  . S/P colonoscopy 2012    pancolonic diverticula, nl TI, external hemorrhoidal tag,/rectum bx no abnormalities  . S/P endoscopy 2012    normal esophagus, stenotic pyloric channel, TTS dilation  . Diarrhea   . GERD (gastroesophageal reflux disease)   . Arthritis   . Hypothyroidism   . Diabetes mellitus     no meds.-diet controlled  . HOH (hard of hearing)   . Anxiety   . HTN (hypertension)     Does not see a cardiologist  . Stroke 08/10/2006    no residual  . Cancer     breast cancer    Past Surgical History: Past Surgical History  Procedure Laterality Date  . Colectomy    . Cholecystectomy    . Right ovarian tumor removed as teenager    . Knee surgery Bilateral     2004- [facility]- total; knee  . Bilateral cataracts    .  Esophagogastroduodenoscopy  10/11/2010    Normal esophagus, small hiatal hernia,Stenotic pyloric channel likely critical.  Likely contributing to some of the patient's symptoms, status post TTS balloon dilation Normal D1-D3 status post biopsy.  D2-D3 atrophic-appearing gastric mucosa status post biopsy  . Ileocolonoscopy  10/11/2010    external hemorrhoidal tag.  Normal rectum status post biopsy Pancolonic diverticula status post  segmental biopsy.  Normal  terminal ileum  . Colonoscopy  09/11/01    Minimal internal hemorrhoids, otherwise normal rectum . Scattered pancolonic diverticula (few) The colonic mucosa otherwise appeared normal status post biopsies and stool sampling as described above.  . Total knee arthroplasty  09/25/2011    Procedure: TOTAL KNEE ARTHROPLASTY;  Surgeon: [name], MD;  Location: [facility];  Service: Orthopedics;  Laterality: Left;  . Breast biopsy Left 01/10/2013    Procedure: BREAST BIOPSY;  Surgeon: Dalia Heading, MD;  Location: [facility];  Service: General;  Laterality: Left;  . Knee arthroplasty Right 24-Oct-2002  . Abdominal hysterectomy  1983  . Simple mastectomy with axillary sentinel node biopsy Left 03/07/2013    Procedure: TOTAL  MASTECTOMY WITH AXILLARY SENTINEL NODE BIOPSY;  Surgeon: Ernestene Mention, MD;  Location: [facility];  Service: General;  Laterality: Left;    Family History: Family History  Problem Relation Age of Onset  . Colon cancer Neg Hx   . Stroke Maternal Grandmother     deceased after 3 strokes  . Stroke Mother     deceased after 3 strokes 1999-10-24  . Stroke Sister     twin sister  . Diabetes Father     Social History History  Substance Use Topics  . Smoking status: Never Smoker   . Smokeless tobacco: Never Used  . Alcohol Use: Yes     Comment: occassional wine    Allergies: No Known Allergies  Current Medications: Current Outpatient Prescriptions  Medication Sig Dispense Refill  . acetaminophen (TYLENOL) 500 MG tablet Take 500-1,000 mg by mouth every 6 (six) hours as needed. Pain       . ALPRAZolam (XANAX) 0.5 MG tablet Take 0.5 mg by mouth 2 (two) times daily as needed for sleep or anxiety.      Marland Kitchen aspirin 325 MG tablet Take 325 mg by mouth daily.      Marland Kitchen atorvastatin (LIPITOR) 10 MG tablet Take 10 mg by mouth daily after breakfast.      . cholestyramine (QUESTRAN) 4 GM/DOSE powder Take 1 to 2 grams twice daily as directed.  378 g  11  . citalopram  (CELEXA) 20 MG tablet Take 20 mg by mouth daily after breakfast.      . levothyroxine (SYNTHROID, LEVOTHROID) 50 MCG tablet Take 50 mcg by mouth daily before breakfast.       . losartan (COZAAR) 50 MG tablet Take 50 mg by mouth daily.      . Probiotic Product (ALIGN PO) Take by mouth.      . RABEprazole (ACIPHEX) 20 MG tablet Take 1 tablet (20 mg total) by mouth daily.  90 tablet  3  . RESTASIS 0.05 % ophthalmic emulsion Place 1 drop into both eyes every 12 (twelve) hours.        No current facility-administered medications for this visit.    OB/GYN History: Menarche at age 61 she underwent menopause in 1981-10-23 she had been on hormone replacement therapy for several years discontinued 08/09/2012. First live birth at 21  Fertility Discussion: Not applicable Prior History of Cancer: No  Health  Maintenance:  Colonoscopy yes Bone Density yes Last PAP smear 1983  ECOG PERFORMANCE STATUS: 0 - Asymptomatic  Genetic Counseling/testing: no  REVIEW OF SYSTEMS:  Patient does complain of some headaches she does have arthritic type of pains history of thyroid problems hot flashes and diabetes. Remainder of the 14 point review of systems is negative.  PHYSICAL EXAMINATION: Blood pressure 120/69, pulse 69, temperature 98.3 F (36.8 C), temperature source Oral, resp. rate 20, height 5' (1.524 m), weight 118 lb 11.2 oz (53.842 kg).  HQI:ONGEX, healthy, no distress, well nourished and well developed SKIN: skin color, texture, turgor are normal HEAD: Normocephalic EYES: PERRLA, EOMI, Conjunctiva are pink and non-injected EARS: External ears normal OROPHARYNX:no exudate, no erythema and lips, buccal mucosa, and tongue normal  NECK: supple, no adenopathy LYMPH:  no palpable lymphadenopathy, no hepatosplenomegaly BREAST:right breast normal without mass, skin or nipple changes or axillary nodes, surgical scars noted mastectomy site. There is no evidence of infections. LUNGS: clear to auscultation and  percussion HEART: regular rate & rhythm ABDOMEN:abdomen soft, non-tender, normal bowel sounds and no masses or organomegaly BACK: Back symmetric, no curvature., No CVA tenderness EXTREMITIES:less then 2 second capillary refill, no edema, no clubbing, no cyanosis  NEURO: alert & oriented x 3 with fluent speech, no focal motor/sensory deficits, gait normal     STUDIES/RESULTS: Chest 2 View  03/03/2013   CLINICAL DATA:  Preop for mastectomy  EXAM: CHEST  2 VIEW  COMPARISON:  09/18/2011  FINDINGS: Cardiomediastinal silhouette is stable. No acute infiltrate or pleural effusion. No pulmonary edema. Bony thorax is unremarkable.  IMPRESSION: No active cardiopulmonary disease.  No significant change.   Electronically Signed   By: Natasha Mead M.D.   On: 03/03/2013 14:44   Nm Sentinel Node Inj-no Rpt (breast)  03/07/2013   CLINICAL DATA: Cancer left breast   Sulfur colloid was injected intradermally by the nuclear medicine  technologist for breast cancer sentinel node localization.      LABS:    Chemistry      Component Value Date/Time   NA 143 03/31/2013 1246   NA 139 03/03/2013 1226   K 4.2 03/31/2013 1246   K 3.9 03/03/2013 1226   CL 101 03/03/2013 1226   CO2 21* 03/31/2013 1246   CO2 27 03/03/2013 1226   BUN 16.8 03/31/2013 1246   BUN 17 03/03/2013 1226   CREATININE 1.0 03/31/2013 1246   CREATININE 0.76 03/07/2013 1228      Component Value Date/Time   CALCIUM 9.6 03/31/2013 1246   CALCIUM 9.5 03/03/2013 1226   ALKPHOS 89 03/31/2013 1246   ALKPHOS 101 03/03/2013 1226   AST 23 03/31/2013 1246   AST 22 03/03/2013 1226   ALT 21 03/31/2013 1246   ALT 32 03/03/2013 1226   BILITOT 0.80 03/31/2013 1246   BILITOT 0.5 03/03/2013 1226      Lab Results  Component Value Date   WBC 8.6 03/31/2013   HGB 13.2 03/31/2013   HCT 39.9 03/31/2013   MCV 88.6 03/31/2013   PLT 359 03/31/2013   PATHOLOGY: ADDITIONAL INFORMATION: 1. PROGNOSTIC INDICATORS - ACIS Results: IMMUNOHISTOCHEMICAL AND MORPHOMETRIC  ANALYSIS BY THE AUTOMATED CELLULAR IMAGING SYSTEM (ACIS) Estrogen Receptor: 0%, NEGATIVE Progesterone Receptor: 0%, NEGATIVE COMMENT: The negative hormone receptor study(ies) in this case have an internal positive control. REFERENCE RANGE ESTROGEN RECEPTOR NEGATIVE <1% POSITIVE =>1% PROGESTERONE RECEPTOR NEGATIVE <1% POSITIVE =>1% All controls stained appropriately Pecola Leisure MD Pathologist, Electronic Signature ( Signed 03/12/2013) FINAL DIAGNOSIS Diagnosis 1. Breast, simple mastectomy,  Left - PAGET DISEASE OF THE BREAST, SEE COMMENT. - SKIN MARGINS, NEGATIVE FOR TUMOR. - DUCTAL CARCINOMA IN SITU, HIGH GRADE. 1 of 3 FINAL for LINETH, THIELKE ([id number]) Diagnosis(continued) - IN SITU CARCINOMA IS 3.0 CM FROM NEAREST MARGIN (DEEP). - SEE TUMOR SYNOPTIC TEMPLATE BELOW. 2. Lymph node, sentinel, biopsy, Left, Axillary #1 - ONE LYMPH NODE, NEGATIVE FOR TUMOR (0/1) Microscopic Comment 1. BREAST, IN SITU CARCINOMA Specimen, including laterality: Left breast. Procedure (include lymph node sampling sentinel-non-sentinel: Simple mastectomy with sentinel lymph node sampling. Grade of carcinoma: III of III Necrosis: Present. Estimated tumor size: (gross measurement or glass slide measurement): 3.5 cm, see comment. Treatment effect: None. If present, treatment effect in breast tissue, lymph nodes or both: N/A Distance to closest margin: 3.0 cm. If margin positive, focally or broadly: N/A Breast prognostic profile: Repeated. Estrogen receptor: Previous studies demonstrated 0% positivity ([id number]) Progesterone receptor: Previous studies demonstrated 0% positivity ([id number]) Lymph nodes: Examined: 1 Sentinel 0 Non-sentinel 1 Total Lymph nodes with metastasis: 0 Isolated tumor cells (< 0.2 mm): N/A Micrometastasis ( > 0.2 mm and < 2.0 mm): N/A Macrometastasis (> 2.0 mm): N/A Extranodal extension: N/A TNM: pTis, pN0 Comments: The entire 3.5 cm area of hemorrhage and fat  necrosis was submitted for review. There are multiple foci of high grade ductal carcinoma in situ present within the slide sections examined. Sections from the skin demonstrate Paget's disease associate with previous biopsy site change. There is no invasive carcinoma identified in any of the slide sections examined. (CRR:gt, 03/10/13) [place] RUND DO Pathologist, Electronic Signature  ASSESSMENT    79 year old female with  #1 new diagnosis of high-grade 3.5 cm DCIS of the left breast originally diagnosed in August 2014. Patient on final pathology had no invasive disease. She is now status post left mastectomy with sentinel lymph node biopsy. The sentinel node was negative for metastatic disease. Postoperatively patient is doing well. Patient and I discussed her pathology, radiology,. We discussed the pathophysiology and treatment options for breast cancer. Certainly patient was a good candidate for a mastectomy due to the extent of disease. We discussed the fact that patient's breast cancer was receptor negative. She would not be a candidate for chemoprevention with tamoxifen or an aromatase inhibitor. She understands that we do not give chemotherapy for noninvasive disease.    PLAN:    #1 patient will continue to be observed  #2 I will see her back in one year time        Thank you so much for allowing me to participate in the care of Doreather B Duchemin. I will continue to follow up the patient with you and assist in her care.  All questions were answered. The patient knows to call the clinic with any problems, questions or concerns. We can certainly see the patient much sooner if necessary.  I spent 30 minutes counseling the patient face to face. The total time spent in the appointment was 30 minutes.

## 2013-04-10 DIAGNOSIS — E119 Type 2 diabetes mellitus without complications: Secondary | ICD-10-CM | POA: Diagnosis not present

## 2013-04-14 ENCOUNTER — Encounter: Payer: Self-pay | Admitting: *Deleted

## 2013-04-14 NOTE — Progress Notes (Signed)
Mailed after appt letter to pt. 

## 2013-04-17 DIAGNOSIS — I10 Essential (primary) hypertension: Secondary | ICD-10-CM | POA: Diagnosis not present

## 2013-04-17 DIAGNOSIS — E119 Type 2 diabetes mellitus without complications: Secondary | ICD-10-CM | POA: Diagnosis not present

## 2013-04-21 DIAGNOSIS — H04159 Secondary lacrimal gland atrophy, unspecified lacrimal gland: Secondary | ICD-10-CM | POA: Diagnosis not present

## 2013-04-21 DIAGNOSIS — Z961 Presence of intraocular lens: Secondary | ICD-10-CM | POA: Diagnosis not present

## 2013-04-21 DIAGNOSIS — Z9849 Cataract extraction status, unspecified eye: Secondary | ICD-10-CM | POA: Diagnosis not present

## 2013-05-02 ENCOUNTER — Other Ambulatory Visit (INDEPENDENT_AMBULATORY_CARE_PROVIDER_SITE_OTHER): Payer: Self-pay | Admitting: *Deleted

## 2013-05-02 DIAGNOSIS — C50919 Malignant neoplasm of unspecified site of unspecified female breast: Secondary | ICD-10-CM

## 2013-05-02 MED ORDER — UNABLE TO FIND: Status: DC

## 2013-05-07 ENCOUNTER — Ambulatory Visit (INDEPENDENT_AMBULATORY_CARE_PROVIDER_SITE_OTHER): Payer: Medicare Other | Admitting: General Surgery

## 2013-05-07 ENCOUNTER — Encounter (INDEPENDENT_AMBULATORY_CARE_PROVIDER_SITE_OTHER): Payer: Self-pay | Admitting: General Surgery

## 2013-05-07 VITALS — BP 138/82 | HR 72 | Temp 97.6°F | Resp 18 | Ht 60.0 in | Wt 120.0 lb

## 2013-05-07 DIAGNOSIS — D0590 Unspecified type of carcinoma in situ of unspecified breast: Secondary | ICD-10-CM

## 2013-05-07 DIAGNOSIS — D0512 Intraductal carcinoma in situ of left breast: Secondary | ICD-10-CM

## 2013-05-07 NOTE — Patient Instructions (Signed)
Your left mastectomy wound has healed very well. You have excellent range of motion of her left shoulder. The intermittent discomfort as you have on the arm should resolve within 6 months or so.  Return to see Dr. Derrell Lolling in April, 2015 for a physical exam  We will plan to get mammograms in July or August.

## 2013-05-07 NOTE — Progress Notes (Signed)
Patient ID: Merna B Weiand, female   DOB: [birth year], 79 y.o.   MRN: [id number]  History:  This patient underwent left total mastectomy and sentinel node biopsy on 03/07/2013. Final pathology showed Paget's disease of the nipple and multiple foci of high-grade DCIS which are receptor negative. Final staging is Tis, N0.She has done well. She is now fully active. She notices a little bit of discomfort under her left arm intermittently with certain positions. No numbness or burning. She has seen Dr. Drue Second who plans to see her in one year and treat her with observation only.  Exam:  Patient is well. Husband and daughter with her.  Left mastectomy wound healing normally. No infection. No hematoma. Range of motion left shoulder is actuallyy excellent.  No sensory deficit  assessment:  Multifocal, high-grade DCIS left breast, 3.5 cm., ER/PR neg., with associated Paget's disease of the nipple. Recovering uneventfully following left mastectomy and sentinel node biopsy  History CVA without residual  Diet-controlled diabetes  Hypertension   Plan:   Return to see me in April, 2015 Annual mammography (July/Aug) and breast exam.  Activities discussed.     [name]. Derrell Lolling, M.D., [facility]  General and Minimally invasive Surgery  Breast and Colorectal Surgery  Office: [phone]  Pager: [phone]

## 2013-07-01 ENCOUNTER — Other Ambulatory Visit (INDEPENDENT_AMBULATORY_CARE_PROVIDER_SITE_OTHER): Payer: Self-pay | Admitting: *Deleted

## 2013-07-01 MED ORDER — UNABLE TO FIND: Status: DC

## 2013-08-12 DIAGNOSIS — E119 Type 2 diabetes mellitus without complications: Secondary | ICD-10-CM | POA: Diagnosis not present

## 2013-08-19 DIAGNOSIS — I10 Essential (primary) hypertension: Secondary | ICD-10-CM | POA: Diagnosis not present

## 2013-08-19 DIAGNOSIS — E119 Type 2 diabetes mellitus without complications: Secondary | ICD-10-CM | POA: Diagnosis not present

## 2013-09-22 ENCOUNTER — Telehealth: Payer: Self-pay | Admitting: Internal Medicine

## 2013-09-22 NOTE — Telephone Encounter (Signed)
Patient and her husband both have a virus, vomiting and diarrhea since Thursday, shes asking what him Angela Galvan ) can take please advise?

## 2013-09-29 NOTE — Telephone Encounter (Signed)
This did not come to my "pt calls"  inbox. I called pt- spoke with her husbandJoneen Caraway, he said they were doing much better now and were feeling well. He said it was just a virus.

## 2013-09-30 ENCOUNTER — Encounter (INDEPENDENT_AMBULATORY_CARE_PROVIDER_SITE_OTHER): Payer: Self-pay | Admitting: General Surgery

## 2013-09-30 ENCOUNTER — Ambulatory Visit (INDEPENDENT_AMBULATORY_CARE_PROVIDER_SITE_OTHER): Payer: Medicare Other | Admitting: General Surgery

## 2013-09-30 VITALS — BP 142/60 | HR 74 | Temp 97.6°F | Resp 14 | Ht 60.0 in | Wt 115.4 lb

## 2013-09-30 DIAGNOSIS — D0590 Unspecified type of carcinoma in situ of unspecified breast: Secondary | ICD-10-CM | POA: Diagnosis not present

## 2013-09-30 DIAGNOSIS — D0510 Intraductal carcinoma in situ of unspecified breast: Secondary | ICD-10-CM

## 2013-09-30 NOTE — Progress Notes (Signed)
Patient ID: Angela Galvan, female   DOB: 1932/07/09, 80 y.o.   MRN: [id number] History:  This patient underwent left total mastectomy and sentinel node biopsy on 03/07/2013. Final pathology showed Paget's disease of the nipple and multiple foci of high-grade DCIS which are receptor negative. Final staging is Tis, N0.She has done well. She is now fully active. She denies or swelling, arm pain. Has full range of motion. No numbness or burning.  She has seen Dr. Marcy Galvan who plans to see her in one year and treat her with observation only.   Exam:  Patient is well. Husband Is with her. Lungs clear to auscultation bilaterally Neck shows no adenopathy or mass Heart regular rate and rhythm. No murmur. No ectopy. Left mastectomy wound healed normally. No infection. No hematoma.Soft tissues are quite soft.Range of motion left shoulder is actually excellent. No sensory deficit   assessment:  Multifocal, high-grade DCIS left breast, 3.5 cm., ER/PR neg., with associated Paget's disease of the nipple. Recovering uneventfully following left mastectomy and sentinel node biopsy  History CVA without residual  Diet-controlled diabetes  Hypertension   Plan:  Return to see me in Oct., 2015  Annual mammography in July.  Activities discussed.    Angela Galvan. Angela Galvan, M.D., [facility]  General and Minimally invasive Surgery  Breast and Colorectal Surgery  Office: [phone]  Pager: [phone]

## 2013-09-30 NOTE — Patient Instructions (Signed)
Examination of your left mastectomy wound, right breast, and all the regional lymph nodes today is normal. There is no evidence of cancer.  Be sure to get a mammogram of the right breast in July of this year  Return to see Dr. Dalbert Batman in October of this year.

## 2013-11-03 ENCOUNTER — Other Ambulatory Visit (HOSPITAL_COMMUNITY): Payer: Self-pay | Admitting: Internal Medicine

## 2013-11-03 DIAGNOSIS — Z139 Encounter for screening, unspecified: Secondary | ICD-10-CM

## 2013-11-03 DIAGNOSIS — K5732 Diverticulitis of large intestine without perforation or abscess without bleeding: Secondary | ICD-10-CM | POA: Diagnosis not present

## 2013-11-03 DIAGNOSIS — Z1231 Encounter for screening mammogram for malignant neoplasm of breast: Secondary | ICD-10-CM

## 2013-11-14 DIAGNOSIS — K5732 Diverticulitis of large intestine without perforation or abscess without bleeding: Secondary | ICD-10-CM | POA: Diagnosis not present

## 2013-11-14 DIAGNOSIS — F329 Major depressive disorder, single episode, unspecified: Secondary | ICD-10-CM | POA: Diagnosis not present

## 2013-11-14 DIAGNOSIS — F3289 Other specified depressive episodes: Secondary | ICD-10-CM | POA: Diagnosis not present

## 2013-11-26 DIAGNOSIS — K5732 Diverticulitis of large intestine without perforation or abscess without bleeding: Secondary | ICD-10-CM | POA: Diagnosis not present

## 2013-12-03 ENCOUNTER — Ambulatory Visit (HOSPITAL_COMMUNITY)
Admission: RE | Admit: 2013-12-03 | Discharge: 2013-12-03 | Disposition: A | Discharge status: Home / Self Care | Payer: Medicare Other | Source: Ambulatory Visit | Attending: Internal Medicine | Admitting: Internal Medicine

## 2013-12-03 ENCOUNTER — Ambulatory Visit (HOSPITAL_COMMUNITY): Payer: Medicare Other

## 2013-12-03 DIAGNOSIS — Z1231 Encounter for screening mammogram for malignant neoplasm of breast: Secondary | ICD-10-CM | POA: Insufficient documentation

## 2013-12-05 DIAGNOSIS — I10 Essential (primary) hypertension: Secondary | ICD-10-CM | POA: Diagnosis not present

## 2013-12-05 DIAGNOSIS — Z79899 Other long term (current) drug therapy: Secondary | ICD-10-CM | POA: Diagnosis not present

## 2013-12-05 DIAGNOSIS — E119 Type 2 diabetes mellitus without complications: Secondary | ICD-10-CM | POA: Diagnosis not present

## 2013-12-05 DIAGNOSIS — E039 Hypothyroidism, unspecified: Secondary | ICD-10-CM | POA: Diagnosis not present

## 2013-12-12 DIAGNOSIS — Z0000 Encounter for general adult medical examination without abnormal findings: Secondary | ICD-10-CM | POA: Diagnosis not present

## 2013-12-12 DIAGNOSIS — Z23 Encounter for immunization: Secondary | ICD-10-CM | POA: Diagnosis not present

## 2013-12-26 ENCOUNTER — Other Ambulatory Visit: Payer: Self-pay | Admitting: Internal Medicine

## 2014-02-17 ENCOUNTER — Ambulatory Visit (INDEPENDENT_AMBULATORY_CARE_PROVIDER_SITE_OTHER): Payer: Medicare Other | Admitting: Internal Medicine

## 2014-02-17 ENCOUNTER — Encounter: Payer: Self-pay | Admitting: Internal Medicine

## 2014-02-17 VITALS — BP 144/62 | HR 65 | Temp 97.6°F | Ht 60.0 in | Wt 115.0 lb

## 2014-02-17 DIAGNOSIS — R197 Diarrhea, unspecified: Secondary | ICD-10-CM | POA: Diagnosis not present

## 2014-02-17 MED ORDER — RIFAXIMIN 550 MG PO TABS: 550.0000 mg | ORAL_TABLET | Freq: Three times a day (TID) | ORAL | Status: DC

## 2014-02-17 NOTE — Patient Instructions (Signed)
Continue Aciphex daily  Course of Xifaxin - will call in a prescription for 2 weeks worth.  Use Questran and imodium as needed  Stool elastase  office visit in 6- 8 weeks

## 2014-02-17 NOTE — Addendum Note (Signed)
Addended by: Claudina Lick on: 02/17/2014 02:54 PM   Modules accepted: Orders

## 2014-02-17 NOTE — Progress Notes (Signed)
Primary Care Physician:  Asencion Noble, MD Primary Gastroenterologist:  Dr. Gala Romney  Pre-Procedure History & Physical: HPI:  Angela Galvan is a 80 y.o. female here for GERD and diarrhea. Felt to have irritable bowel syndrome-D.  More bad days than good days force diarrhea concern. She takes Questran every day she's constipated. Takes Lomotil before she goes out. No rectal bleeding. Extensive workup previously for other causes of diarrhea have been negative. She has not had a stool elastase done, however.  She is down 2 pounds since her last visit.  GERD continues to be well managed with AcipHex 20 mg daily.  Past Medical History  Diagnosis Date  . Diverticulitis     s/p colectomy in 1991  . IBS (irritable bowel syndrome)   . Hyperlipidemia   . S/P colonoscopy 2003    minimal internal hemorrhoids, pancolonic diverticula, biopsy of rectum: lymphoplasmacytic microscopic colitis, colon biopsies normal  . S/P colonoscopy 2012    pancolonic diverticula, nl TI, external hemorrhoidal tag,/rectum bx no abnormalities  . S/P endoscopy 2012    normal esophagus, stenotic pyloric channel, TTS dilation  . Diarrhea   . GERD (gastroesophageal reflux disease)   . Arthritis   . Hypothyroidism   . Diabetes mellitus     no meds.-diet controlled  . HOH (hard of hearing)   . Anxiety   . HTN (hypertension)     Does not see a cardiologist  . Stroke 08/10/2006    no residual  . Cancer     breast cancer  . 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 Colectomy    . Cholecystectomy    . Right ovarian tumor removed as teenager    . Knee surgery Bilateral     2004- [place]- total; knee  . Bilateral cataracts    . Esophagogastroduodenoscopy  10/11/2010    Normal esophagus, small hiatal hernia,Stenotic pyloric channel likely critical.  Likely contributing to some of the  patient's symptoms, status post TTS balloon dilation Normal D1-D3 status post biopsy.  D2-D3 atrophic-appearing gastric mucosa status post biopsy  . Ileocolonoscopy  10/11/2010    external hemorrhoidal tag.  Normal rectum status post biopsy Pancolonic diverticula status post segmental biopsy.  Normal  terminal ileum  . Colonoscopy  09/11/01    Minimal internal hemorrhoids, otherwise normal rectum . Scattered pancolonic diverticula (few) The colonic mucosa otherwise appeared normal status post biopsies and stool sampling as described above.  . Total knee arthroplasty  09/25/2011    Procedure: TOTAL KNEE ARTHROPLASTY;  Surgeon: Gearlean Alf, MD;  Location: [facility];  Service: Orthopedics;  Laterality: Left;  . Breast biopsy Left 01/10/2013    Procedure: BREAST BIOPSY;  Surgeon: Jamesetta So, MD;  Location: [facility];  Service: General;  Laterality: Left;  . Knee arthroplasty Right 2004  . Abdominal hysterectomy  1983  . Simple mastectomy with axillary sentinel node biopsy Left 03/07/2013    Procedure: TOTAL  MASTECTOMY WITH AXILLARY SENTINEL NODE BIOPSY;  Surgeon: Adin Hector, MD;  Location: [facility];  Service: General;  Laterality: Left;    Prior to Admission medications   Medication Sig Start Date End Date Taking? Authorizing Provider  acetaminophen (TYLENOL) 500 MG tablet Take 500-1,000 mg by mouth every 6 (six) hours as needed. Pain    Yes Historical Provider, MD  ALPRAZolam Duanne Moron) 0.5  MG tablet Take 0.5 mg by mouth 2 (two) times daily as needed for sleep or anxiety.   Yes Historical Provider, MD  aspirin 325 MG tablet Take 325 mg by mouth daily.   Yes Historical Provider, MD  atorvastatin (LIPITOR) 10 MG tablet Take 10 mg by mouth daily after breakfast.   Yes Historical Provider, MD  citalopram (CELEXA) 20 MG tablet Take 20 mg by mouth daily after breakfast.   Yes Historical Provider, MD  levothyroxine (SYNTHROID, LEVOTHROID) 50 MCG tablet Take 50 mcg by mouth daily before breakfast.     Yes Historical Provider, MD  losartan (COZAAR) 50 MG tablet Take 100 mg by mouth daily.    Yes Historical Provider, MD  NON FORMULARY Hydrocodone      Not sure strength    Has only taken a couple since July for herniated disc   Yes Historical Provider, MD  Probiotic Product (ALIGN PO) Take by mouth.   Yes Historical Provider, MD  RABEprazole (ACIPHEX) 20 MG tablet TAKE 1 TABLET DAILY 12/26/13  Yes Mahala Menghini, PA-C  RESTASIS 0.05 % ophthalmic emulsion Place 1 drop into both eyes every 12 (twelve) hours.  01/11/11  Yes Historical Provider, MD  cholestyramine Lucrezia Starch) 4 GM/DOSE powder Take 1 to 2 grams twice daily as directed.   Pt only takes as needed/ said it causes constipation if she takes it regularly 09/19/11   Mahala Menghini, PA-C  UNABLE TO FIND Rx: [place] (Quantity: 6) [id number]- Silicone Breast Prosthesis (Quantity: 1) Dx: 174.9; Left Mastectomy 05/01/13   Alphonsa Overall, MD  UNABLE TO FIND Rx: [phone]- Mastectomy Form, left (Quantity: 1) Dx: 174.9; Left mastectomy 07/01/13   Fanny Skates, MD    Allergies as of 02/17/2014  . (No Known Allergies)    Family History  Problem Relation Age of Onset  . Colon cancer Neg Hx   . Stroke Maternal Grandmother     deceased after 3 strokes  . Stroke Mother     deceased after 3 strokes 09-11-1999  . Stroke Sister     twin sister  . Diabetes Father     History   Social History  . Marital Status: Married    Spouse Name: N/A    Number of Children: N/A  . Years of Education: N/A   Occupational History  . retired    Social History Main Topics  . Smoking status: Never Smoker   . Smokeless tobacco: Never Used     Comment: Never smoked  . Alcohol Use: Yes     Comment: occassional wine  . Drug Use: No  . Sexual Activity: No   Other Topics Concern  . Not on file   Social History Narrative  . No narrative on file    Review of Systems: See HPI, otherwise negative ROS  Physical Exam: BP 144/62  Pulse 65  Temp(Src)  97.6 F (36.4 C) (Oral)  Ht 5' (1.524 m)  Wt 115 lb (52.164 kg)  BMI 22.46 kg/m2 General:   Alert,  elderly pleasant and cooperative in NAD Skin:  Intact without significant lesions or rashes. Eyes:  Sclera clear, no icterus.   Conjunctiva pink. Ears:  Normal auditory acuity. Nose:  No deformity, discharge,  or lesions. Mouth:  No deformity or lesions. Neck:  Supple; no masses or thyromegaly. No significant cervical adenopathy. Lungs:  Clear throughout to auscultation.   No wheezes, crackles, or rhonchi. No acute distress. Heart:  Regular rate and rhythm; no murmurs, clicks, rubs,  or gallops. Abdomen: Non-distended, normal bowel sounds.  Soft and nontender without appreciable mass or hepatosplenomegaly.  Pulses:  Normal pulses noted. Extremities:  Without clubbing or edema.  Impression: Chronic (intermittent) nonbloody diarrhea. Most likely IBS-D. An element of diabetic diarrhea and the possibility of pancreatic exocrine insufficiency could also be contributing factors. Her diarrhea has been a stable GI symptom over a period of several years.  Recommendations:    2 week treatment course of Xifaxan 500 mg 3 times a day -  New FDA approved treatment regimen for irritable bowel syndrome. If it works for her, it may provide her months of improvement in symptoms.   Continue Aciphex daily  Use Questran and imodium as needed  Stool elastase  office visit in 6- 8 weeks    Notice: This dictation was prepared with Dragon dictation along with smaller phrase technology. Any transcriptional errors that result from this process are unintentional and may not be corrected upon review.

## 2014-02-21 ENCOUNTER — Telehealth: Payer: Self-pay | Admitting: Hematology and Oncology

## 2014-02-21 NOTE — Telephone Encounter (Signed)
s/w pt re new appt for lb/VG 11/23.

## 2014-03-03 LAB — PANCREATIC ELASTASE, FECAL: Pancreatic Elastase-1, Stool: >500 mcg/g

## 2014-03-09 DIAGNOSIS — Z23 Encounter for immunization: Secondary | ICD-10-CM | POA: Diagnosis not present

## 2014-03-20 ENCOUNTER — Encounter: Payer: Self-pay | Admitting: Internal Medicine

## 2014-03-20 ENCOUNTER — Ambulatory Visit (INDEPENDENT_AMBULATORY_CARE_PROVIDER_SITE_OTHER): Payer: Medicare Other | Admitting: Internal Medicine

## 2014-03-20 VITALS — BP 139/65 | HR 69 | Temp 96.9°F | Ht 60.0 in | Wt 115.2 lb

## 2014-03-20 DIAGNOSIS — K589 Irritable bowel syndrome without diarrhea: Secondary | ICD-10-CM

## 2014-03-20 DIAGNOSIS — K219 Gastro-esophageal reflux disease without esophagitis: Secondary | ICD-10-CM | POA: Diagnosis not present

## 2014-03-20 NOTE — Progress Notes (Signed)
Primary Care Physician:  Asencion Noble, MD Primary Gastroenterologist:  Dr. Gala Romney  Pre-Procedure History & Physical: HPI:  Angela Galvan is a 80 y.o. female here for follow-up of IBS-D. Patient completed a course of Xifaxan for IBS earlier in the month. She states so far, she's had dramatic improvement in her diarrhea. She is quite pleased.  GERD symptoms well controlled on AcipHex. She continues to feel that she is helped by using Questran at bedtime.  Past Medical History  Diagnosis Date  . Diverticulitis     s/p colectomy in 1991  . IBS (irritable bowel syndrome)   . Hyperlipidemia   . S/P colonoscopy 2003    minimal internal hemorrhoids, pancolonic diverticula, biopsy of rectum: lymphoplasmacytic microscopic colitis, colon biopsies normal  . S/P colonoscopy 2012    pancolonic diverticula, nl TI, external hemorrhoidal tag,/rectum bx no abnormalities  . S/P endoscopy 2012    normal esophagus, stenotic pyloric channel, TTS dilation  . Diarrhea   . GERD (gastroesophageal reflux disease)   . Arthritis   . Hypothyroidism   . Diabetes mellitus     no meds.-diet controlled  . HOH (hard of hearing)   . Anxiety   . HTN (hypertension)     Does not see a cardiologist  . Stroke 08/10/2006    no residual  . Cancer     breast cancer  . 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 Colectomy    . Cholecystectomy    . Right ovarian tumor removed as teenager    . Knee surgery Bilateral     2004- [place]- total; knee  . Bilateral cataracts    . Esophagogastroduodenoscopy  10/11/2010    Normal esophagus, small hiatal hernia,Stenotic pyloric channel likely critical.  Likely contributing to some of the patient's symptoms, status post TTS balloon dilation Normal D1-D3 status post biopsy.  D2-D3 atrophic-appearing gastric mucosa status post biopsy  .  Ileocolonoscopy  10/11/2010    external hemorrhoidal tag.  Normal rectum status post biopsy Pancolonic diverticula status post segmental biopsy.  Normal  terminal ileum  . Colonoscopy  09/11/01    Minimal internal hemorrhoids, otherwise normal rectum . Scattered pancolonic diverticula (few) The colonic mucosa otherwise appeared normal status post biopsies and stool sampling as described above.  . Total knee arthroplasty  09/25/2011    Procedure: TOTAL KNEE ARTHROPLASTY;  Surgeon: Gearlean Alf, MD;  Location: [facility];  Service: Orthopedics;  Laterality: Left;  . Breast biopsy Left 01/10/2013    Procedure: BREAST BIOPSY;  Surgeon: Jamesetta So, MD;  Location: [facility];  Service: General;  Laterality: Left;  . Knee arthroplasty Right 2004  . Abdominal hysterectomy  1983  . Simple mastectomy with axillary sentinel node biopsy Left 03/07/2013    Procedure: TOTAL  MASTECTOMY WITH AXILLARY SENTINEL NODE BIOPSY;  Surgeon: Adin Hector, MD;  Location: [facility];  Service: General;  Laterality: Left;    Prior to Admission medications   Medication Sig Start Date End Date Taking? Authorizing Provider  acetaminophen (TYLENOL) 500 MG tablet Take 500-1,000 mg by mouth every 6 (six) hours as needed. Pain    Yes Historical Provider, MD  ALPRAZolam (XANAX) 0.5 MG tablet Take 0.5 mg by mouth 2 (two) times daily as needed for sleep or anxiety.   Yes Historical Provider, MD  aspirin 325 MG  tablet Take 325 mg by mouth daily.   Yes Historical Provider, MD  atorvastatin (LIPITOR) 10 MG tablet Take 10 mg by mouth daily after breakfast.   Yes Historical Provider, MD  cholestyramine (QUESTRAN) 4 GM/DOSE powder Take 1 to 2 grams twice daily as directed.   Pt only takes as needed/ said it causes constipation if she takes it regularly 09/19/11  Yes Mahala Menghini, PA-C  FLUoxetine (PROZAC) 20 MG capsule Take 20 mg by mouth daily.   Yes Historical Provider, MD  levothyroxine (SYNTHROID, LEVOTHROID) 50 MCG tablet Take 50 mcg  by mouth daily before breakfast.    Yes Historical Provider, MD  losartan (COZAAR) 50 MG tablet Take 100 mg by mouth daily.    Yes Historical Provider, MD  Probiotic Product (ALIGN PO) Take by mouth.   Yes Historical Provider, MD  RABEprazole (ACIPHEX) 20 MG tablet TAKE 1 TABLET DAILY 12/26/13  Yes Mahala Menghini, PA-C  RESTASIS 0.05 % ophthalmic emulsion Place 1 drop into both eyes every 12 (twelve) hours.  01/11/11  Yes Historical Provider, MD  citalopram (CELEXA) 20 MG tablet Take 20 mg by mouth daily after breakfast.    Historical Provider, MD  NON FORMULARY Hydrocodone      Not sure strength    Has only taken a couple since July for herniated disc    Historical Provider, MD  rifaximin (XIFAXAN) 550 MG TABS tablet Take 1 tablet (550 mg total) by mouth 3 (three) times daily. For 14 days 02/17/14   Daneil Dolin, MD  UNABLE TO FIND Rx: [phone]- Post Surgical Bras (Quantity: 6) [id number]- Silicone Breast Prosthesis (Quantity: 1) Dx: 174.9; Left Mastectomy 05/01/13   Alphonsa Overall, MD  UNABLE TO FIND Rx: [phone]- Mastectomy Form, left (Quantity: 1) Dx: 174.9; Left mastectomy 07/01/13   Fanny Skates, MD    Allergies as of 03/20/2014  . (No Known Allergies)    Family History  Problem Relation Age of Onset  . Colon cancer Neg Hx   . Stroke Maternal Grandmother     deceased after 3 strokes  . Stroke Mother     deceased after 3 strokes 09-11-99  . Stroke Sister     twin sister  . Diabetes Father     History   Social History  . Marital Status: Married    Spouse Name: N/A    Number of Children: N/A  . Years of Education: N/A   Occupational History  . retired    Social History Main Topics  . Smoking status: Never Smoker   . Smokeless tobacco: Never Used     Comment: Never smoked  . Alcohol Use: Yes     Comment: occassional wine  . Drug Use: No  . Sexual Activity: No   Other Topics Concern  . Not on file   Social History Narrative  . No narrative on file    Review of Systems: See  HPI, otherwise negative ROS  Physical Exam: BP 139/65  Pulse 69  Temp(Src) 96.9 F (36.1 C) (Oral)  Ht 5' (1.524 m)  Wt 115 lb 3.2 oz (52.254 kg)  BMI 22.50 kg/m2 General:   Alert,  Well-developed, well-nourished, pleasant and cooperative in NAD Skin:  Intact without significant lesions or rashes. Eyes:  Sclera clear, no icterus.   Conjunctiva pink. Ears:  Normal auditory acuity. Nose:  No deformity, discharge,  or lesions. Mouth:  No deformity or lesions. Neck:  Supple; no masses or thyromegaly. No significant cervical adenopathy. Lungs:  Clear  throughout to auscultation.   No wheezes, crackles, or rhonchi. No acute distress. Heart:  Regular rate and rhythm; no murmurs, clicks, rubs,  or gallops. Abdomen: Non-distended, normal bowel sounds.  Soft and nontender without appreciable mass or hepatosplenomegaly.  Pulses:  Normal pulses noted. Extremities:  Without clubbing or edema.  Impression:  Pleasant 80 year old lady with IBS-D. So far, she's had a very nice response to 2 week course of Xifaxan, a new FDA approved regimen for IBS. It remains to be seen how long her improved symptoms will last. She has not been cured. GERD symptoms well controlled on AcipHex.  Recommendations:  Continue Aciphex daily  Continue Questran at bedtime  Continue Probiotic  Use Imodium as needed  Office visit in 4 months   Notice: This dictation was prepared with Dragon dictation along with smaller phrase technology. Any transcriptional errors that result from this process are unintentional and may not be corrected upon review.

## 2014-03-20 NOTE — Patient Instructions (Signed)
Continue Aciphex daily  Continue Questran at bedtime  Continue Probiotic  Use Imodium as needed  Office visit in 4 months

## 2014-04-02 ENCOUNTER — Other Ambulatory Visit: Payer: Medicare Other

## 2014-04-02 ENCOUNTER — Ambulatory Visit: Payer: Medicare Other | Admitting: Oncology

## 2014-04-07 DIAGNOSIS — E119 Type 2 diabetes mellitus without complications: Secondary | ICD-10-CM | POA: Diagnosis not present

## 2014-04-15 DIAGNOSIS — E1129 Type 2 diabetes mellitus with other diabetic kidney complication: Secondary | ICD-10-CM | POA: Diagnosis not present

## 2014-04-15 DIAGNOSIS — G4709 Other insomnia: Secondary | ICD-10-CM | POA: Diagnosis not present

## 2014-04-15 DIAGNOSIS — I10 Essential (primary) hypertension: Secondary | ICD-10-CM | POA: Diagnosis not present

## 2014-04-17 ENCOUNTER — Other Ambulatory Visit: Payer: Self-pay | Admitting: *Deleted

## 2014-04-17 DIAGNOSIS — C50012 Malignant neoplasm of nipple and areola, left female breast: Secondary | ICD-10-CM

## 2014-04-20 ENCOUNTER — Ambulatory Visit (HOSPITAL_BASED_OUTPATIENT_CLINIC_OR_DEPARTMENT_OTHER): Payer: Medicare Other | Admitting: Hematology and Oncology

## 2014-04-20 ENCOUNTER — Other Ambulatory Visit (HOSPITAL_BASED_OUTPATIENT_CLINIC_OR_DEPARTMENT_OTHER): Payer: Medicare Other

## 2014-04-20 ENCOUNTER — Telehealth: Payer: Self-pay | Admitting: Hematology and Oncology

## 2014-04-20 VITALS — BP 137/52 | HR 66 | Temp 97.8°F | Resp 18 | Ht 60.0 in | Wt 115.1 lb

## 2014-04-20 DIAGNOSIS — C50012 Malignant neoplasm of nipple and areola, left female breast: Secondary | ICD-10-CM

## 2014-04-20 DIAGNOSIS — Z853 Personal history of malignant neoplasm of breast: Secondary | ICD-10-CM

## 2014-04-20 DIAGNOSIS — D0512 Intraductal carcinoma in situ of left breast: Secondary | ICD-10-CM

## 2014-04-20 LAB — COMPREHENSIVE METABOLIC PANEL (CC13)
ALT: 19 U/L (ref 0–55)
ANION GAP: 9 meq/L (ref 3–11)
AST: 21 U/L (ref 5–34)
Albumin: 4 g/dL (ref 3.5–5.0)
Alkaline Phosphatase: 101 U/L (ref 40–150)
BILIRUBIN TOTAL: 0.51 mg/dL (ref 0.20–1.20)
BUN: 27.1 mg/dL — ABNORMAL HIGH (ref 7.0–26.0)
CO2: 23 mEq/L (ref 22–29)
Calcium: 9.5 mg/dL (ref 8.4–10.4)
Chloride: 109 mEq/L (ref 98–109)
Creatinine: 1.1 mg/dL (ref 0.6–1.1)
Glucose: 102 mg/dl (ref 70–140)
Potassium: 4.9 mEq/L (ref 3.5–5.1)
Sodium: 141 mEq/L (ref 136–145)
Total Protein: 6.9 g/dL (ref 6.4–8.3)

## 2014-04-20 LAB — CBC WITH DIFFERENTIAL/PLATELET
BASO%: 0.4 % (ref 0.0–2.0)
BASOS ABS: 0 10*3/uL (ref 0.0–0.1)
EOS%: 0.8 % (ref 0.0–7.0)
Eosinophils Absolute: 0.1 10*3/uL (ref 0.0–0.5)
HCT: 38.7 % (ref 34.8–46.6)
HEMOGLOBIN: 12.8 g/dL (ref 11.6–15.9)
LYMPH%: 41.9 % (ref 14.0–49.7)
MCH: 30.1 pg (ref 25.1–34.0)
MCHC: 33.1 g/dL (ref 31.5–36.0)
MCV: 91.1 fL (ref 79.5–101.0)
MONO#: 1 10*3/uL — ABNORMAL HIGH (ref 0.1–0.9)
MONO%: 12 % (ref 0.0–14.0)
NEUT#: 3.6 10*3/uL (ref 1.5–6.5)
NEUT%: 44.9 % (ref 38.4–76.8)
PLATELETS: 324 10*3/uL (ref 145–400)
RBC: 4.25 10*6/uL (ref 3.70–5.45)
RDW: 12.9 % (ref 11.2–14.5)
WBC: 7.9 10*3/uL (ref 3.9–10.3)
lymph#: 3.3 10*3/uL (ref 0.9–3.3)

## 2014-04-20 NOTE — Telephone Encounter (Signed)
, °

## 2014-04-20 NOTE — Progress Notes (Signed)
Patient Care Team: Asencion Noble, MD as PCP - General (Internal Medicine) Daneil Dolin, MD (Gastroenterology)  DIAGNOSIS: high-grade DCIS status post mastectomy with sentinel lymph node biopsy on 03/07/2013 Grade 3, ER/PR negative hence did not require antiestrogen therapy. CHIEF COMPLIANT: followup of DCIS  INTERVAL HISTORY: Angela Galvan is a 80 year old Caucasian lady with above-mentioned history of high-grade DCIS involving the left breast treated with left breast mastectomy. It was ER/PR negative and hence she did not require any antiestrogen therapy. She is being followed without any major problems or concerns. She had a mammogram done in July 2015 which was normal. She denies any new problems or concerns. Denies any lumps or nodules in breast.  REVIEW OF SYSTEMS:   Constitutional: Denies fevers, chills or abnormal weight loss Eyes: Denies blurriness of vision Ears, nose, mouth, throat, and face: Denies mucositis or sore throat Respiratory: Denies cough, dyspnea or wheezes Cardiovascular: Denies palpitation, chest discomfort or lower extremity swelling Gastrointestinal:  Denies nausea, heartburn or change in bowel habits Skin: Denies abnormal skin rashes Lymphatics: Denies new lymphadenopathy or easy bruising Neurological:Denies numbness, tingling or new weaknesses Behavioral/Psych: Mood is stable, no new changes  Breast:  denies any pain or lumps or nodules in either breasts All other systems were reviewed with the patient and are negative.  I have reviewed the past medical history, past surgical history, social history and family history with the patient and they are unchanged from previous note.  ALLERGIES:  has No Known Allergies.  MEDICATIONS:  Current Outpatient Prescriptions  Medication Sig Dispense Refill  . acetaminophen (TYLENOL) 500 MG tablet Take 500-1,000 mg by mouth every 6 (six) hours as needed. Pain     . ALPRAZolam (XANAX) 0.5 MG tablet Take 0.5 mg by mouth 2  (two) times daily as needed for sleep or anxiety.    Marland Kitchen aspirin 325 MG tablet Take 325 mg by mouth daily.    Marland Kitchen atorvastatin (LIPITOR) 10 MG tablet Take 10 mg by mouth daily after breakfast.    . cholestyramine (QUESTRAN) 4 GM/DOSE powder Take 1 to 2 grams twice daily as directed.   Pt only takes as needed/ said it causes constipation if she takes it regularly    . citalopram (CELEXA) 20 MG tablet Take 20 mg by mouth daily after breakfast.    . FLUoxetine (PROZAC) 20 MG capsule Take 20 mg by mouth daily.    Marland Kitchen levothyroxine (SYNTHROID, LEVOTHROID) 50 MCG tablet Take 50 mcg by mouth daily before breakfast.     . losartan (COZAAR) 50 MG tablet Take 100 mg by mouth daily.     . NON FORMULARY Hydrocodone      Not sure strength    Has only taken a couple since July for herniated disc    . Probiotic Product (ALIGN PO) Take by mouth.    . RABEprazole (ACIPHEX) 20 MG tablet TAKE 1 TABLET DAILY 90 tablet 3  . RESTASIS 0.05 % ophthalmic emulsion Place 1 drop into both eyes every 12 (twelve) hours.     . rifaximin (XIFAXAN) 550 MG TABS tablet Take 1 tablet (550 mg total) by mouth 3 (three) times daily. For 14 days 42 tablet 0  . UNABLE TO FIND Rx: [id number]- Post Surgical Bras (Quantity: 6) [id number]- Silicone Breast Prosthesis (Quantity: 1) Dx: 174.9; Left Mastectomy 1 each 0  . UNABLE TO FIND Rx: [id number]- Mastectomy Form, left (Quantity: 1) Dx: 174.9; Left mastectomy 1 each 0   No current facility-administered medications for this visit.  PHYSICAL EXAMINATION: ECOG PERFORMANCE STATUS: 0 - Asymptomatic  Filed Vitals:   04/20/14 1138  BP: 137/52  Pulse: 66  Temp: 97.8 F (36.6 C)  Resp: 18   Filed Weights   04/20/14 1138  Weight: 115 lb 1.6 oz (52.209 kg)    GENERAL:alert, no distress and comfortable SKIN: skin color, texture, turgor are normal, no rashes or significant lesions EYES: normal, Conjunctiva are pink and non-injected, sclera clear OROPHARYNX:no exudate, no erythema and lips, buccal  mucosa, and tongue normal  NECK: supple, thyroid normal size, non-tender, without nodularity LYMPH:  no palpable lymphadenopathy in the cervical, axillary or inguinal LUNGS: clear to auscultation and percussion with normal breathing effort HEART: regular rate & rhythm and no murmurs and no lower extremity edema ABDOMEN:abdomen soft, non-tender and normal bowel sounds Musculoskeletal:no cyanosis of digits and no clubbing  NEURO: alert & oriented x 3 with fluent speech, no focal motor/sensory deficits BREAST: No palpable masses or nodules in either right breast. No palpable axillary supraclavicular or infraclavicular adenopathy no breast tenderness or nipple discharge. Left chest wall is without any nodules or axillary lymphadenopathy   LABORATORY DATA:  I have reviewed the data as listed   Chemistry      Component Value Date/Time   NA 141 04/20/2014 1105   NA 139 03/03/2013 1226   K 4.9 04/20/2014 1105   K 3.9 03/03/2013 1226   CL 101 03/03/2013 1226   CO2 23 04/20/2014 1105   CO2 27 03/03/2013 1226   BUN 27.1* 04/20/2014 1105   BUN 17 03/03/2013 1226   CREATININE 1.1 04/20/2014 1105   CREATININE 0.76 03/07/2013 1228      Component Value Date/Time   CALCIUM 9.5 04/20/2014 1105   CALCIUM 9.5 03/03/2013 1226   ALKPHOS 101 04/20/2014 1105   ALKPHOS 101 03/03/2013 1226   AST 21 04/20/2014 1105   AST 22 03/03/2013 1226   ALT 19 04/20/2014 1105   ALT 32 03/03/2013 1226   BILITOT 0.51 04/20/2014 1105   BILITOT 0.5 03/03/2013 1226       Lab Results  Component Value Date   WBC 7.9 04/20/2014   HGB 12.8 04/20/2014   HCT 38.7 04/20/2014   MCV 91.1 04/20/2014   PLT 324 04/20/2014   NEUTROABS 3.6 04/20/2014     RADIOGRAPHIC STUDIES: I have personally reviewed the radiology reports and agreed with their findings.  mammogram done July 2015 was normal on the right breast  ASSESSMENT & PLAN:  Ductal carcinoma in situ (DCIS) of left breast Left breast high-grade DCIS ER/PR  negative status post mastectomy with sentinel lymph node 03/07/2013 grade 3 ER 0%, PR 0%, 1 SLN negative, DCIS size 3.5 cm margins negative. Currently on observation  Surveillance: once a year mammograms. Today's breast exam was normal. I reviewed the mammograms done July 2015 which were normal.  Survivorship:Discussed the importance of physical exercise in decreasing the likelihood of breast cancer recurrence. Recommended 30 mins daily 6 days a week of either brisk walking or cycling or swimming. Encouraged patient to eat more fruits and vegetables and decrease red meat.     Orders Placed This Encounter  Procedures  . CBC with Differential    Standing Status: Future     Number of Occurrences:      Standing Expiration Date: 04/20/2015  . Comprehensive metabolic panel (Cmet) - [facility]    Standing Status: Future     Number of Occurrences:      Standing Expiration Date: 04/20/2015   The patient  has a good understanding of the overall plan. she agrees with it. She will call with any problems that may develop before her next visit here.   Rulon Eisenmenger, MD 04/20/2014 12:34 PM

## 2014-04-20 NOTE — Assessment & Plan Note (Addendum)
Left breast high-grade DCIS ER/PR negative status post mastectomy with sentinel lymph node 03/07/2013 grade 3 ER 0%, PR 0%, 1 SLN negative, DCIS size 3.5 cm margins negative. Currently on observation  Surveillance: once a year mammograms. Today's breast exam was normal. I reviewed the mammograms done July 2015 which were normal.  Survivorship:Discussed the importance of physical exercise in decreasing the likelihood of breast cancer recurrence. Recommended 30 mins daily 6 days a week of either brisk walking or cycling or swimming. Encouraged patient to eat more fruits and vegetables and decrease red meat.

## 2014-05-04 DIAGNOSIS — I10 Essential (primary) hypertension: Secondary | ICD-10-CM | POA: Diagnosis not present

## 2014-05-04 DIAGNOSIS — I6930 Unspecified sequelae of cerebral infarction: Secondary | ICD-10-CM | POA: Diagnosis not present

## 2014-05-04 DIAGNOSIS — E119 Type 2 diabetes mellitus without complications: Secondary | ICD-10-CM | POA: Diagnosis not present

## 2014-05-04 DIAGNOSIS — D0592 Unspecified type of carcinoma in situ of left breast: Secondary | ICD-10-CM | POA: Diagnosis not present

## 2014-05-11 DIAGNOSIS — K5792 Diverticulitis of intestine, part unspecified, without perforation or abscess without bleeding: Secondary | ICD-10-CM | POA: Diagnosis not present

## 2014-06-04 DIAGNOSIS — H35363 Drusen (degenerative) of macula, bilateral: Secondary | ICD-10-CM | POA: Diagnosis not present

## 2014-06-04 DIAGNOSIS — H5203 Hypermetropia, bilateral: Secondary | ICD-10-CM | POA: Diagnosis not present

## 2014-06-04 DIAGNOSIS — H524 Presbyopia: Secondary | ICD-10-CM | POA: Diagnosis not present

## 2014-06-04 DIAGNOSIS — H52223 Regular astigmatism, bilateral: Secondary | ICD-10-CM | POA: Diagnosis not present

## 2014-06-30 ENCOUNTER — Encounter: Payer: Self-pay | Admitting: Internal Medicine

## 2014-08-06 DIAGNOSIS — K5792 Diverticulitis of intestine, part unspecified, without perforation or abscess without bleeding: Secondary | ICD-10-CM | POA: Diagnosis not present

## 2014-08-10 DIAGNOSIS — E119 Type 2 diabetes mellitus without complications: Secondary | ICD-10-CM | POA: Diagnosis not present

## 2014-08-17 DIAGNOSIS — I10 Essential (primary) hypertension: Secondary | ICD-10-CM | POA: Diagnosis not present

## 2014-08-17 DIAGNOSIS — E1129 Type 2 diabetes mellitus with other diabetic kidney complication: Secondary | ICD-10-CM | POA: Diagnosis not present

## 2014-08-20 ENCOUNTER — Ambulatory Visit (INDEPENDENT_AMBULATORY_CARE_PROVIDER_SITE_OTHER): Payer: Medicare Other | Admitting: Internal Medicine

## 2014-08-20 ENCOUNTER — Other Ambulatory Visit: Payer: Self-pay

## 2014-08-20 ENCOUNTER — Encounter: Payer: Self-pay | Admitting: Internal Medicine

## 2014-08-20 VITALS — BP 126/67 | HR 72 | Temp 98.2°F | Ht 60.0 in | Wt 117.8 lb

## 2014-08-20 DIAGNOSIS — R197 Diarrhea, unspecified: Secondary | ICD-10-CM | POA: Diagnosis not present

## 2014-08-20 DIAGNOSIS — R1032 Left lower quadrant pain: Principal | ICD-10-CM

## 2014-08-20 DIAGNOSIS — R1031 Right lower quadrant pain: Secondary | ICD-10-CM

## 2014-08-20 NOTE — Progress Notes (Signed)
Primary Care Physician:  Asencion Noble, MD Primary Gastroenterologist:  Dr. Gala Romney  Pre-Procedure History & Physical: HPI:  Angela Galvan is a 81 y.o. female here for followup of IBS D. Patient experienced long-lasting improvement after a three-week course of Xifaxan last year. She tells me since October, however, she's been treated for diverticulitis with antibiotics-3 episodes she just finished her last course 5 days ago. She states she is no better and that now she has bilateral lower quadrant abdominal pain (left side greater than right. She's also having nonbloody watery diarrhea upper to 12 stools a day. She is taking Questran chronically. Last CT scan done 2011 demonstrated diverticulosis but no diverticulitis. Apparent prior resection for diverticulitis. She has not had a fever nausea or vomiting. GERD symptoms well controlled on AcipHex 20 mg daily.  Past Medical History  Diagnosis Date  . Diverticulitis     s/p colectomy in 1991  . IBS (irritable bowel syndrome)   . Hyperlipidemia   . S/P colonoscopy 2003    minimal internal hemorrhoids, pancolonic diverticula, biopsy of rectum: lymphoplasmacytic microscopic colitis, colon biopsies normal  . S/P colonoscopy 2012    pancolonic diverticula, nl TI, external hemorrhoidal tag,/rectum bx no abnormalities  . S/P endoscopy 2012    normal esophagus, stenotic pyloric channel, TTS dilation  . Diarrhea   . GERD (gastroesophageal reflux disease)   . Arthritis   . Hypothyroidism   . Diabetes mellitus     no meds.-diet controlled  . HOH (hard of hearing)   . Anxiety   . HTN (hypertension)     Does not see a cardiologist  . Stroke 08/10/2006    no residual  . Cancer     breast cancer  . 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 Colectomy    . Cholecystectomy    . Right ovarian tumor removed as  teenager    . Knee surgery Bilateral     2004- [facility]- total; knee  . Bilateral cataracts    . Esophagogastroduodenoscopy  10/11/2010    Normal esophagus, small hiatal hernia,Stenotic pyloric channel likely critical.  Likely contributing to some of the patient's symptoms, status post TTS balloon dilation Normal D1-D3 status post biopsy.  D2-D3 atrophic-appearing gastric mucosa status post biopsy  . Ileocolonoscopy  10/11/2010    external hemorrhoidal tag.  Normal rectum status post biopsy Pancolonic diverticula status post segmental biopsy.  Normal  terminal ileum  . Colonoscopy  09/11/01    Minimal internal hemorrhoids, otherwise normal rectum . Scattered pancolonic diverticula (few) The colonic mucosa otherwise appeared normal status post biopsies and stool sampling as described above.  . Total knee arthroplasty  09/25/2011    Procedure: TOTAL KNEE ARTHROPLASTY;  Surgeon: Gearlean Alf, MD;  Location: [facility];  Service: Orthopedics;  Laterality: Left;  . Breast biopsy Left 01/10/2013    Procedure: BREAST BIOPSY;  Surgeon: Jamesetta So, MD;  Location: [facility];  Service: General;  Laterality: Left;  . Knee arthroplasty Right 2004  . Abdominal hysterectomy  1983  . Simple mastectomy with axillary sentinel node biopsy Left 03/07/2013    Procedure: TOTAL  MASTECTOMY WITH AXILLARY SENTINEL NODE BIOPSY;  Surgeon: Adin Hector, MD;  Location: [facility];  Service: General;  Laterality: Left;    Prior to Admission medications   Medication Sig Start Date End  Date Taking? Authorizing Provider  ALPRAZolam Duanne Moron) 0.5 MG tablet Take 0.5 mg by mouth 2 (two) times daily as needed for sleep or anxiety.   Yes Historical Provider, MD  aspirin 325 MG tablet Take 325 mg by mouth daily.   Yes Historical Provider, MD  atorvastatin (LIPITOR) 10 MG tablet Take 10 mg by mouth daily after breakfast.   Yes Historical Provider, MD  cholestyramine (QUESTRAN) 4 GM/DOSE powder Take 1 to 2 grams twice daily as directed.   Pt  only takes as needed/ said it causes constipation if she takes it regularly 09/19/11  Yes Mahala Menghini, PA-C  FLUoxetine (PROZAC) 20 MG capsule Take 20 mg by mouth daily.   Yes Historical Provider, MD  levothyroxine (SYNTHROID, LEVOTHROID) 50 MCG tablet Take 50 mcg by mouth daily before breakfast.    Yes Historical Provider, MD  losartan (COZAAR) 50 MG tablet Take 100 mg by mouth daily.    Yes Historical Provider, MD  Probiotic Product (ALIGN PO) Take by mouth.   Yes Historical Provider, MD  RABEprazole (ACIPHEX) 20 MG tablet TAKE 1 TABLET DAILY 12/26/13  Yes Mahala Menghini, PA-C  RESTASIS 0.05 % ophthalmic emulsion Place 1 drop into both eyes every 12 (twelve) hours.  01/11/11  Yes Historical Provider, MD  acetaminophen (TYLENOL) 500 MG tablet Take 500-1,000 mg by mouth every 6 (six) hours as needed. Pain     Historical Provider, MD  citalopram (CELEXA) 20 MG tablet Take 20 mg by mouth daily after breakfast.    Historical Provider, MD  NON FORMULARY Hydrocodone      Not sure strength    Has only taken a couple since July for herniated disc    Historical Provider, MD  rifaximin (XIFAXAN) 550 MG TABS tablet Take 1 tablet (550 mg total) by mouth 3 (three) times daily. For 14 days Patient not taking: Reported on 08/20/2014 02/17/14   Daneil Dolin, MD  UNABLE TO FIND Rx: [place] (Quantity: 6) [id number]- Silicone Breast Prosthesis (Quantity: 1) Dx: 174.9; Left Mastectomy Patient not taking: Reported on 08/20/2014 05/01/13   Alphonsa Overall, MD  UNABLE TO FIND Rx: [id number]- Mastectomy Form, left (Quantity: 1) Dx: 174.9; Left mastectomy Patient not taking: Reported on 08/20/2014 07/01/13   Fanny Skates, MD    Allergies as of 08/20/2014  . (No Known Allergies)    Family History  Problem Relation Age of Onset  . Colon cancer Neg Hx   . Stroke Maternal Grandmother     deceased after 3 strokes  . Stroke Mother     deceased after 3 strokes September 07, 1999  . Stroke Sister     twin sister  . Diabetes  Father     History   Social History  . Marital Status: Married    Spouse Name: N/A  . Number of Children: N/A  . Years of Education: N/A   Occupational History  . retired    Social History Main Topics  . Smoking status: Never Smoker   . Smokeless tobacco: Never Used     Comment: Never smoked  . Alcohol Use: Yes     Comment: occassional wine  . Drug Use: No  . Sexual Activity: No   Other Topics Concern  . Not on file   Social History Narrative    Review of Systems: See HPI, otherwise negative ROS  Physical Exam: BP 126/67 mmHg  Pulse 72  Temp(Src) 98.2 F (36.8 C) (Oral)  Ht 5' (1.524 m)  Wt 117  lb 12.8 oz (53.434 kg)  BMI 23.01 kg/m2 General:    pleasant and cooperative in NAD. This lady does not appear acutely ill or toxic to me at this time. Skin:  Intact without significant lesions or rashes. Eyes:  Sclera clear, no icterus.   Conjunctiva pink. Ears:  Normal auditory acuity. Nose:  No deformity, discharge,  or lesions. Mouth:  No deformity or lesions. Neck:  Supple; no masses or thyromegaly. No significant cervical adenopathy. Lungs:  Clear throughout to auscultation.   No wheezes, crackles, or rhonchi. No acute distress. Heart:  Regular rate and rhythm; no murmurs, clicks, rubs,  or gallops. Abdomen: nondistended. Laparotomy scar well healed. Positive bowel sounds. She does have localized left lower quadrant abdominal tenderness with some guarding but no rebound. No appreciable mass or organomegaly. Right lower quadrant minimally tender to deep palpation. Pulses:  Normal pulses noted. Extremities:  Without clubbing or edema.  Impression:  81 year old lady with history of IBS-D with recurrent abdominal pain suggestive of possible diverticulitis with no recent cross-sectional imaging. She now has significant diarrhea after being given a course of Cipro and Flagyl.   She's had recurrent bouts over time. Multiple rounds of antibiotics. History of partial colectomy  for complicated diverticulitis in the distant past..    Recommendations:  CT with contrast - abdomen and pelvis - to evaluate bilateral lower quadrant abdominal pain  CBC, BEMET today  Stool for Cdiff assay  Stop Questran for now  Further recommendations to follow.  Depending on C. Difficile and CT results, patient may benefit from another three-week course of Xifaxan.         Notice: This dictation was prepared with Dragon dictation along with smaller phrase technology. Any transcriptional errors that result from this process are unintentional and may not be corrected upon review.

## 2014-08-20 NOTE — Patient Instructions (Addendum)
CT with contrast - abdomen and pelvis - to evaluate bilateral lower quadrant abdominal pain  CBC, BEMET today  Stool for Cdiff assay  Stop Questran for now  Further recommendations to follow

## 2014-08-21 DIAGNOSIS — R197 Diarrhea, unspecified: Secondary | ICD-10-CM | POA: Diagnosis not present

## 2014-08-21 DIAGNOSIS — R1032 Left lower quadrant pain: Secondary | ICD-10-CM | POA: Diagnosis not present

## 2014-08-24 ENCOUNTER — Encounter (HOSPITAL_COMMUNITY): Payer: Self-pay

## 2014-08-24 ENCOUNTER — Ambulatory Visit (HOSPITAL_COMMUNITY)
Admission: RE | Admit: 2014-08-24 | Discharge: 2014-08-24 | Disposition: A | Discharge status: Home / Self Care | Payer: Medicare Other | Source: Ambulatory Visit | Attending: Internal Medicine | Admitting: Internal Medicine

## 2014-08-24 ENCOUNTER — Ambulatory Visit (HOSPITAL_COMMUNITY): Payer: Medicare Other

## 2014-08-24 DIAGNOSIS — K769 Liver disease, unspecified: Secondary | ICD-10-CM | POA: Insufficient documentation

## 2014-08-24 DIAGNOSIS — R1031 Right lower quadrant pain: Secondary | ICD-10-CM

## 2014-08-24 DIAGNOSIS — K5710 Diverticulosis of small intestine without perforation or abscess without bleeding: Secondary | ICD-10-CM | POA: Diagnosis not present

## 2014-08-24 DIAGNOSIS — R1032 Left lower quadrant pain: Secondary | ICD-10-CM | POA: Diagnosis not present

## 2014-08-24 DIAGNOSIS — R1030 Lower abdominal pain, unspecified: Secondary | ICD-10-CM | POA: Diagnosis not present

## 2014-08-24 LAB — POCT I-STAT CREATININE: Creatinine, Ser: 1 mg/dL (ref 0.50–1.10)

## 2014-08-24 MED ORDER — IOHEXOL 300 MG/ML  SOLN
100.0000 mL | Freq: Once | INTRAMUSCULAR | Status: AC | PRN
  Administered 2014-08-24: 100 mL via INTRAVENOUS

## 2014-08-28 NOTE — Progress Notes (Signed)
Contact information noted.  Need ov w me in about 2 mos

## 2014-08-28 NOTE — Progress Notes (Signed)
Quick Note:  Editor: Daneil Dolin, MD (Physician)    Expand All Collapse All   Contact information noted. Need ov w me in about 2 mos      ______

## 2014-08-31 NOTE — Progress Notes (Signed)
APPOINTMENT MADE AND PATIENT AWARE OF DATE AND TIME  °

## 2014-10-14 DIAGNOSIS — K5730 Diverticulosis of large intestine without perforation or abscess without bleeding: Secondary | ICD-10-CM | POA: Diagnosis not present

## 2014-10-20 ENCOUNTER — Ambulatory Visit (INDEPENDENT_AMBULATORY_CARE_PROVIDER_SITE_OTHER): Payer: Medicare Other | Admitting: Internal Medicine

## 2014-10-20 ENCOUNTER — Encounter: Payer: Self-pay | Admitting: Internal Medicine

## 2014-10-20 VITALS — BP 126/69 | HR 76 | Temp 97.1°F | Ht 60.0 in | Wt 116.4 lb

## 2014-10-20 DIAGNOSIS — K589 Irritable bowel syndrome without diarrhea: Secondary | ICD-10-CM

## 2014-10-20 DIAGNOSIS — R1032 Left lower quadrant pain: Secondary | ICD-10-CM | POA: Diagnosis not present

## 2014-10-20 NOTE — Patient Instructions (Signed)
Finish out course of antibiotics prescribed by Dr. Willey Blade  Low residue diet   Office visit with [place] in 1 week  Call in the interim if symptoms worsen

## 2014-10-20 NOTE — Progress Notes (Signed)
Primary Care Physician:  Asencion Noble, MD Primary Gastroenterologist:  Dr. Gala Romney  Pre-Procedure History & Physical: HPI:  Angela Galvan is a 81 y.o. female here for follow up of chronic bilateral lower quadrant abdominal pain. Had worsening of pain last week. Saw Dr. Willey Blade. Clinically, he felt she had low-grade diverticulitis. Cipro and Flagyl prescribed. She's got 4 more days to go on this treatment regimen. Transient nausea when beginning metronidazole but that has since subsided. No fever or chills. No hematochezia, melena no hematemesis. CT in March of this year demonstrated no evidence of diverticulitis or other acute inflammatory process which could explain abdominal pain. She has multiple GYN surgeries. A white count back in March was 7100 be met look good.  Past Medical History  Diagnosis Date  . Diverticulitis     s/p colectomy in 1991  . IBS (irritable bowel syndrome)   . Hyperlipidemia   . S/P colonoscopy 2003    minimal internal hemorrhoids, pancolonic diverticula, biopsy of rectum: lymphoplasmacytic microscopic colitis, colon biopsies normal  . S/P colonoscopy 2012    pancolonic diverticula, nl TI, external hemorrhoidal tag,/rectum bx no abnormalities  . S/P endoscopy 2012    normal esophagus, stenotic pyloric channel, TTS dilation  . Diarrhea   . GERD (gastroesophageal reflux disease)   . Arthritis   . Hypothyroidism   . Diabetes mellitus     no meds.-diet controlled  . HOH (hard of hearing)   . Anxiety   . HTN (hypertension)     Does not see a cardiologist  . Stroke 08/10/2006    no residual  . Cancer     breast cancer  . 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 Colectomy    . Cholecystectomy    . Right ovarian tumor removed as teenager    . Knee surgery Bilateral     2004- [place]- total; knee  . Bilateral cataracts    .  Esophagogastroduodenoscopy  10/11/2010    Normal esophagus, small hiatal hernia,Stenotic pyloric channel likely critical.  Likely contributing to some of the patient's symptoms, status post TTS balloon dilation Normal D1-D3 status post biopsy.  D2-D3 atrophic-appearing gastric mucosa status post biopsy  . Ileocolonoscopy  10/11/2010    external hemorrhoidal tag.  Normal rectum status post biopsy Pancolonic diverticula status post segmental biopsy.  Normal  terminal ileum  . Colonoscopy  09/11/01    Minimal internal hemorrhoids, otherwise normal rectum . Scattered pancolonic diverticula (few) The colonic mucosa otherwise appeared normal status post biopsies and stool sampling as described above.  . Total knee arthroplasty  09/25/2011    Procedure: TOTAL KNEE ARTHROPLASTY;  Surgeon: Gearlean Alf, MD;  Location: [facility];  Service: Orthopedics;  Laterality: Left;  . Breast biopsy Left 01/10/2013    Procedure: BREAST BIOPSY;  Surgeon: Jamesetta So, MD;  Location: [facility];  Service: General;  Laterality: Left;  . Knee arthroplasty Right 2004  . Abdominal hysterectomy  1983  . Simple mastectomy with axillary sentinel node biopsy Left 03/07/2013    Procedure: TOTAL  MASTECTOMY WITH AXILLARY SENTINEL NODE BIOPSY;  Surgeon: Adin Hector, MD;  Location: [facility];  Service: General;  Laterality: Left;    Prior to Admission medications   Medication Sig Start Date End Date Taking? Authorizing Provider  acetaminophen (TYLENOL) 500 MG tablet Take 500-1,000 mg  by mouth every 6 (six) hours as needed. Pain    Yes Historical Provider, MD  ALPRAZolam (XANAX) 0.5 MG tablet Take 0.5 mg by mouth 2 (two) times daily as needed for sleep or anxiety.   Yes Historical Provider, MD  aspirin 325 MG tablet Take 325 mg by mouth daily.   Yes Historical Provider, MD  atorvastatin (LIPITOR) 10 MG tablet Take 10 mg by mouth daily after breakfast.   Yes Historical Provider, MD  cholestyramine (QUESTRAN) 4 GM/DOSE powder Take 1  to 2 grams twice daily as directed.   Pt only takes as needed/ said it causes constipation if she takes it regularly 09/19/11  Yes Mahala Menghini, PA-C  ciprofloxacin (CIPRO) 500 MG tablet Take 500 mg by mouth 2 (two) times daily.   Yes Historical Provider, MD  citalopram (CELEXA) 20 MG tablet Take 20 mg by mouth daily after breakfast.   Yes Historical Provider, MD  FLUoxetine (PROZAC) 20 MG capsule Take 20 mg by mouth daily.   Yes Historical Provider, MD  levothyroxine (SYNTHROID, LEVOTHROID) 50 MCG tablet Take 50 mcg by mouth daily before breakfast.    Yes Historical Provider, MD  losartan (COZAAR) 50 MG tablet Take 100 mg by mouth daily.    Yes Historical Provider, MD  metroNIDAZOLE (FLAGYL) 500 MG tablet Take 500 mg by mouth 3 (three) times daily.   Yes Historical Provider, MD  NON FORMULARY Hydrocodone      Not sure strength    Has only taken a couple since July for herniated disc   Yes Historical Provider, MD  Probiotic Product (ALIGN PO) Take by mouth.   Yes Historical Provider, MD  RABEprazole (ACIPHEX) 20 MG tablet TAKE 1 TABLET DAILY 12/26/13  Yes Mahala Menghini, PA-C  RESTASIS 0.05 % ophthalmic emulsion Place 1 drop into both eyes every 12 (twelve) hours.  01/11/11  Yes Historical Provider, MD  rifaximin (XIFAXAN) 550 MG TABS tablet Take 1 tablet (550 mg total) by mouth 3 (three) times daily. For 14 days Patient not taking: Reported on 08/20/2014 02/17/14   Daneil Dolin, MD  UNABLE TO FIND Rx: [place] (Quantity: 6) [id number]- Silicone Breast Prosthesis (Quantity: 1) Dx: 174.9; Left Mastectomy Patient not taking: Reported on 10/20/2014 05/01/13   Alphonsa Overall, MD  UNABLE TO FIND Rx: [id number]- Mastectomy Form, left (Quantity: 1) Dx: 174.9; Left mastectomy Patient not taking: Reported on 10/20/2014 07/01/13   Fanny Skates, MD    Allergies as of 10/20/2014  . (No Known Allergies)    Family History  Problem Relation Age of Onset  . Colon cancer Neg Hx   . Stroke Maternal  Grandmother     deceased after 3 strokes  . Stroke Mother     deceased after 3 strokes 08/18/99  . Stroke Sister     twin sister  . Diabetes Father     History   Social History  . Marital Status: Married    Spouse Name: N/A  . Number of Children: N/A  . Years of Education: N/A   Occupational History  . retired    Social History Main Topics  . Smoking status: Never Smoker   . Smokeless tobacco: Never Used     Comment: Never smoked  . Alcohol Use: Yes     Comment: occassional wine  . Drug Use: No  . Sexual Activity: No   Other Topics Concern  . Not on file   Social History Narrative    Review of Systems:  See HPI, otherwise negative ROS  Physical Exam: BP 126/69 mmHg  Pulse 76  Temp(Src) 97.1 F (36.2 C)  Ht 5' (1.524 m)  Wt 116 lb 6.4 oz (52.799 kg)  BMI 22.73 kg/m2 General:   Alert,  Well-developed, well-nourished, pleasant and cooperative in NAD Skin:  Intact without significant lesions or rashes. Eyes:  Sclera clear, no icterus.   Conjunctiva pink. Ears:  Normal auditory acuity. Nose:  No deformity, discharge,  or lesions. Mouth:  No deformity or lesions. Neck:  Supple; no masses or thyromegaly. No significant cervical adenopathy. Lungs:  Clear throughout to auscultation.   No wheezes, crackles, or rhonchi. No acute distress. Heart:  Regular rate and rhythm; no murmurs, clicks, rubs,  or gallops. Abdomen: Non-distended, normal bowel sounds.  Soft and nontender without appreciable mass or hepatosplenomegaly.  Pulses:  Normal pulses noted. Extremities:  Without clubbing or edema.  Impression:  Pleasant 81 year old lady with chronic abdominal pain, intermittent diarrhea  -  likely consistent with IBS D. Good response with Xifaxin - IBS regimen. Saw Dr. Willey Blade last week because she had significant left lower quadrant tenderness. She is finishing a course of oral Cipro and Flagyl for presumed diverticulitis. Transient nausea likely related to Flagyl-subsided.  No  indication of diverticulitis on CT scan in March. Labs, also okay. C. Difficile negative.  Patient does not appear acutely ill or toxic at this time.  Recommendations:  Elizebeth Koller out current course of Cipro and Flagyl.  Low residue diet.  Continue to refrain from taking Questran.  Office visit with [place] in one week and we will we'll reassess.  I offer Zofran for nausea the patient declines stating nausea has improved.     Notice: This dictation was prepared with Dragon dictation along with smaller phrase technology. Any transcriptional errors that result from this process are unintentional and may not be corrected upon review.

## 2014-10-27 ENCOUNTER — Encounter: Payer: Self-pay | Admitting: Internal Medicine

## 2014-10-28 ENCOUNTER — Other Ambulatory Visit (HOSPITAL_COMMUNITY): Payer: Self-pay | Admitting: Internal Medicine

## 2014-10-28 DIAGNOSIS — Z1231 Encounter for screening mammogram for malignant neoplasm of breast: Secondary | ICD-10-CM

## 2014-10-29 ENCOUNTER — Ambulatory Visit (INDEPENDENT_AMBULATORY_CARE_PROVIDER_SITE_OTHER): Payer: Medicare Other | Admitting: Gastroenterology

## 2014-10-29 ENCOUNTER — Encounter: Payer: Self-pay | Admitting: Gastroenterology

## 2014-10-29 VITALS — BP 128/64 | HR 72 | Temp 97.3°F | Ht 61.0 in | Wt 114.4 lb

## 2014-10-29 DIAGNOSIS — R1032 Left lower quadrant pain: Secondary | ICD-10-CM | POA: Diagnosis not present

## 2014-10-29 DIAGNOSIS — K589 Irritable bowel syndrome without diarrhea: Secondary | ICD-10-CM

## 2014-10-29 MED ORDER — HYDROCODONE-ACETAMINOPHEN 5-325 MG PO TABS: 1.0000 | ORAL_TABLET | Freq: Four times a day (QID) | ORAL | Status: DC | PRN

## 2014-10-29 MED ORDER — RIFAXIMIN 550 MG PO TABS: 550.0000 mg | ORAL_TABLET | Freq: Three times a day (TID) | ORAL | Status: DC

## 2014-10-29 NOTE — Progress Notes (Signed)
Primary Care Physician: Asencion Noble, MD  Primary Gastroenterologist:  Garfield Cornea, MD   Chief Complaint  Patient presents with  . Follow-up    HPI: Angela Galvan is a 81 y.o. female here for follow-up of abdominal pain. She has a history of IBS-D (extensive evaluation in the past and tertiary referral) and diverticulitis (prior resection for diverticulitis in 1991). Seen back in March and May of this year for ongoing abdominal pain left side greater than right. Reports being treated for diverticulitis at least 4-6 times in the past 6 months really with no significant improvement of her symptoms. This last time has been the worst episode and noted no improvement in her left lower quadrant pain at all. She had a CT scan in March 2016 with no evidence of acute diverticulitis. 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 Stable lytic lesion in the proximal left femur, similar lesion in the right iliac bone, compared to 2011 study, unchanged. Negative C. difficile toxin PCR at that time as well.  Patient states she completed Cipro/Flagyl one week ago. Rates her left lower quadrant pain 7 out of 10, constant. Seems to be worse with movement. Unrelated to meals. No improvement with bowel movements. Typically has worsening diarrhea with these episodes. This time her stools have been urgent. "Mushy" instead of diarrhea like previous cases. Rare imodium. Not on questran at this time. Recent black but ?related to antibiotics. Denies Pepto-Bismol. 5-6 stools today. Complains of nocturnal diarrhea. Denies any fever but notes that she hasn't checked it. She does have chills/sweats. Last time yesterday.      Current Outpatient Prescriptions  Medication Sig Dispense Refill  . acetaminophen (TYLENOL) 500 MG tablet Take 500-1,000 mg by mouth every 6 (six) hours as needed. Pain     . ALPRAZolam (XANAX) 0.5 MG tablet Take 0.5 mg by mouth 2 (two) times daily as needed for sleep or anxiety.     Marland Kitchen aspirin 325 MG tablet Take 325 mg by mouth daily.    Marland Kitchen atorvastatin (LIPITOR) 10 MG tablet Take 10 mg by mouth daily after breakfast.    . cholestyramine (QUESTRAN) 4 GM/DOSE powder Take 1 to 2 grams twice daily as directed.   Pt only takes as needed/ said it causes constipation if she takes it regularly    . citalopram (CELEXA) 20 MG tablet Take 20 mg by mouth daily after breakfast.    . FLUoxetine (PROZAC) 20 MG capsule Take 20 mg by mouth daily.    Marland Kitchen levothyroxine (SYNTHROID, LEVOTHROID) 50 MCG tablet Take 50 mcg by mouth daily before breakfast.     . losartan (COZAAR) 50 MG tablet Take 100 mg by mouth daily.     . NON FORMULARY Hydrocodone      Not sure strength    Has only taken a couple since July for herniated disc    . Probiotic Product (ALIGN PO) Take by mouth.    . RABEprazole (ACIPHEX) 20 MG tablet TAKE 1 TABLET DAILY 90 tablet 3  . RESTASIS 0.05 % ophthalmic emulsion Place 1 drop into both eyes every 12 (twelve) hours.     Marland Kitchen UNABLE TO FIND Rx: L8000- Post Surgical Bras (Quantity: 6) H6314- Silicone Breast Prosthesis (Quantity: 1) Dx: 174.9; Left Mastectomy 1 each 0  . UNABLE TO FIND Rx: [id number]- Mastectomy Form, left (Quantity: 1) Dx: 174.9; Left mastectomy 1 each 0   No current facility-administered medications for this visit.    Allergies as of 10/29/2014  . (  No Known Allergies)   Past Medical History  Diagnosis Date  . Diverticulitis     s/p colectomy in 1991  . IBS (irritable bowel syndrome)   . Hyperlipidemia   . S/P colonoscopy 2003    minimal internal hemorrhoids, pancolonic diverticula, biopsy of rectum: lymphoplasmacytic microscopic colitis, colon biopsies normal  . S/P colonoscopy 2012    pancolonic diverticula, nl TI, external hemorrhoidal tag,/rectum bx no abnormalities  . S/P endoscopy 2012    normal esophagus, stenotic pyloric channel, TTS dilation  . Diarrhea   . GERD (gastroesophageal reflux disease)   . Arthritis   . Hypothyroidism   . Diabetes  mellitus     no meds.-diet controlled  . HOH (hard of hearing)   . Anxiety   . HTN (hypertension)     Does not see a cardiologist  . Stroke 08/10/2006    no residual  . Cancer     breast cancer  . 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 Colectomy    . Cholecystectomy    . Right ovarian tumor removed as teenager    . Knee surgery Bilateral     2004- [place]- total; knee  . Bilateral cataracts    . Esophagogastroduodenoscopy  10/11/2010    Normal esophagus, small hiatal hernia,Stenotic pyloric channel likely critical.  Likely contributing to some of the patient's symptoms, status post TTS balloon dilation Normal D1-D3 status post biopsy.  D2-D3 atrophic-appearing gastric mucosa status post biopsy  . Ileocolonoscopy  10/11/2010    external hemorrhoidal tag.  Normal rectum status post biopsy Pancolonic diverticula status post segmental biopsy.  Normal  terminal ileum  . Colonoscopy  09/11/01    Minimal internal hemorrhoids, otherwise normal rectum . Scattered pancolonic diverticula (few) The colonic mucosa otherwise appeared normal status post biopsies and stool sampling as described above.  . Total knee arthroplasty  09/25/2011    Procedure: TOTAL KNEE ARTHROPLASTY;  Surgeon: Gearlean Alf, MD;  Location: [facility];  Service: Orthopedics;  Laterality: Left;  . Breast biopsy Left 01/10/2013    Procedure: BREAST BIOPSY;  Surgeon: Jamesetta So, MD;  Location: [facility];  Service: General;  Laterality: Left;  . Knee arthroplasty Right 2004  . Abdominal hysterectomy  1983  . Simple mastectomy with axillary sentinel node biopsy Left 03/07/2013    Procedure: TOTAL  MASTECTOMY WITH AXILLARY SENTINEL NODE BIOPSY;  Surgeon: Adin Hector, MD;  Location: [facility];  Service: General;  Laterality: Left;    ROS:  General: Negative for anorexia, weight loss, fever,  chills, fatigue, weakness. ENT: Negative for hoarseness, difficulty swallowing , nasal congestion. CV: Negative for chest pain, angina, palpitations, dyspnea on exertion, peripheral edema.  Respiratory: Negative for dyspnea at rest, dyspnea on exertion, cough, sputum, wheezing.  GI: See history of present illness. GU:  Negative for dysuria, hematuria, urinary incontinence, urinary frequency, nocturnal urination.  Endo: Negative for unusual weight change.    Physical Examination:   BP 128/64 mmHg  Pulse 72  Temp(Src) 97.3 F (36.3 C) (Oral)  Ht 5\' 1"  (1.549 m)  Wt 114 lb 6.4 oz (51.891 kg)  BMI 21.63 kg/m2  General: Well-nourished, well-developed in no acute distress.  Eyes: No icterus. Mouth: Oropharyngeal mucosa moist and pink , no lesions erythema or exudate. Lungs: Clear to auscultation bilaterally.  Heart: Regular rate and rhythm, no murmurs  rubs or gallops.  Abdomen: Bowel sounds are normal, moderate LLQ tenderness and tenderness with palpation of the inguinal area without obvious hernia.  nondistended, no hepatosplenomegaly or masses, no abdominal bruits or hernia , no rebound or guarding.   Extremities: No lower extremity edema. No clubbing or deformities. Neuro: Alert and oriented x 4   Skin: Warm and dry, no jaundice.   Psych: Alert and cooperative, normal mood and affect.   Imaging Studies: No results found.

## 2014-10-29 NOTE — Progress Notes (Signed)
Please let patient know that I discussed findings with Dr. Gala Romney. He feels is in her best interest to have a CT of the abdomen and pelvis with contrast to rule out smoldering diverticulitis or other etiology to explain her ongoing left lower quadrant pain.  Schedule CT abdomen pelvis with IV/oral contrast Diagnosis: Persisting left lower quadrant pain, evaluate for left inguinal hernia

## 2014-10-29 NOTE — Patient Instructions (Signed)
1. Please have your labs done.  2. Start Xifaxan 550mg  three times a day. Samples provided and prescription sent to your pharmacy.

## 2014-10-29 NOTE — Assessment & Plan Note (Signed)
81 year old female with ongoing left lower quadrant abdominal pain treated empirically on multiple occasions for diverticulitis. Last CT imaging in March with no acute diverticulitis noted. Prior CT was in 2011. History of lytic lesion in the proximal left femur stable between studies. Patient was requested to follow-up with Dr. Willey Blade regarding these findings. Presents back today with ongoing abdominal pain after completion of last round of Cipro and Flagyl. She has been treated multiple times this year for suspected diverticulitis. At baseline she has IBS D the symptoms out of proportion for this diagnosis. She has nocturnal diarrhea. She has unrelenting abdominal pain not improved with bowel movements. Would be concerned for smoldering diverticulitis. Discussed at length with Dr. Gala Romney. Recommends repeat CT abdomen and pelvis with contrast. We will update a CBC. For IBS D we will provide Xifaxan 550 mg orally 3 times a day for 2 weeks to see if we get any added benefit.  For fever, worsening symptoms, she's been instructed to go to the ER.

## 2014-10-30 LAB — CBC WITH DIFFERENTIAL/PLATELET
BASOS ABS: 0.1 10*3/uL (ref 0.0–0.1)
Basophils Relative: 1 % (ref 0–1)
EOS ABS: 0.1 10*3/uL (ref 0.0–0.7)
Eosinophils Relative: 1 % (ref 0–5)
HEMATOCRIT: 39 % (ref 36.0–46.0)
Hemoglobin: 13.2 g/dL (ref 12.0–15.0)
Lymphocytes Relative: 36 % (ref 12–46)
Lymphs Abs: 2.6 10*3/uL (ref 0.7–4.0)
MCH: 30.4 pg (ref 26.0–34.0)
MCHC: 33.8 g/dL (ref 30.0–36.0)
MCV: 89.9 fL (ref 78.0–100.0)
MONO ABS: 0.8 10*3/uL (ref 0.1–1.0)
MPV: 8.9 fL (ref 8.6–12.4)
Monocytes Relative: 11 % (ref 3–12)
NEUTROS ABS: 3.7 10*3/uL (ref 1.7–7.7)
Neutrophils Relative %: 51 % (ref 43–77)
Platelets: 404 10*3/uL — ABNORMAL HIGH (ref 150–400)
RBC: 4.34 MIL/uL (ref 3.87–5.11)
RDW: 13.9 % (ref 11.5–15.5)
WBC: 7.3 10*3/uL (ref 4.0–10.5)

## 2014-10-30 NOTE — Progress Notes (Signed)
Quick Note:  LMOM to call. ______ 

## 2014-10-30 NOTE — Progress Notes (Signed)
Quick Note:  Labs look ok. As discussed with Dr. Gala Romney and [facility] yesterday,  Patient needs CT A/P with contrast, scheduled for 11/04/14. If symptoms worse in the interim, please advise patient to go to ER. ______

## 2014-10-30 NOTE — Progress Notes (Signed)
Pt is aware and CT is set for 11/04/2014 @ 830am. Pt is aware that she needs to pick up contrast to drink in xray

## 2014-11-02 ENCOUNTER — Telehealth: Payer: Self-pay

## 2014-11-02 NOTE — Telephone Encounter (Signed)
Pt is calling because she said that the Xifaxan was not called into the Pharmacy. Please advise

## 2014-11-02 NOTE — Telephone Encounter (Signed)
Pt returned call and is aware that labs are normal.

## 2014-11-02 NOTE — Telephone Encounter (Signed)
Please check with her pharmacy. I sent in RX on 10/29/14 to [facility] for [place] 550mg  po TID, #42 and no refills.  It even says the receipt confirmed by pharmacy on 10/29/14 at 4:05PM.

## 2014-11-02 NOTE — Telephone Encounter (Signed)
Pt is aware and has picked it up.

## 2014-11-04 ENCOUNTER — Ambulatory Visit (HOSPITAL_COMMUNITY)
Admission: RE | Admit: 2014-11-04 | Discharge: 2014-11-04 | Disposition: A | Discharge status: Home / Self Care | Payer: Medicare Other | Source: Ambulatory Visit | Attending: Gastroenterology | Admitting: Gastroenterology

## 2014-11-04 DIAGNOSIS — R1032 Left lower quadrant pain: Secondary | ICD-10-CM | POA: Diagnosis not present

## 2014-11-04 DIAGNOSIS — K589 Irritable bowel syndrome without diarrhea: Secondary | ICD-10-CM

## 2014-11-04 DIAGNOSIS — K760 Fatty (change of) liver, not elsewhere classified: Secondary | ICD-10-CM | POA: Diagnosis not present

## 2014-11-04 LAB — POCT I-STAT CREATININE: Creatinine, Ser: 0.9 mg/dL (ref 0.44–1.00)

## 2014-11-04 MED ORDER — SODIUM CHLORIDE 0.9 % IJ SOLN
INTRAMUSCULAR | Status: AC
Start: 2014-11-04 — End: 2014-11-04
  Filled 2014-11-04: qty 750

## 2014-11-04 MED ORDER — IOHEXOL 300 MG/ML  SOLN
100.0000 mL | Freq: Once | INTRAMUSCULAR | Status: AC | PRN
  Administered 2014-11-04: 100 mL via INTRAVENOUS

## 2014-11-04 MED ORDER — SODIUM CHLORIDE 0.9 % IJ SOLN
INTRAMUSCULAR | Status: AC
  Filled 2014-11-04: qty 45

## 2014-11-09 NOTE — Progress Notes (Signed)
Quick Note:  Nothing to explain her abdominal pain. No evidence of diverticulitis.  Did she ever discus the lytic lesions in her left proximal femur (left upper leg) and right iliac bone (right pelvis) with Dr. Willey Blade. Has been present for years but ?etiology.  Continue Xifaxan and let me know if symptoms don't improve. ______

## 2014-11-11 ENCOUNTER — Telehealth: Payer: Self-pay | Admitting: *Deleted

## 2014-11-11 NOTE — Telephone Encounter (Signed)
LMOAM

## 2014-11-11 NOTE — Telephone Encounter (Signed)
Spoke with patient. LLQ pain is constant, 5/10. Just like at [place]. Worse with movement. Unrelated to meals, BMs. No fever. Already knew about lytic bone lesions and was told they were benign and not going to cause her any problems. Present for years and this pain started several months ago. Also with known bulging disc, multilevel.  To discuss further with Dr. Oneida Alar tomorrow and get back in touch with the patient.

## 2014-11-11 NOTE — Telephone Encounter (Signed)
Spoke with the pt- she continues to have lower L side abd pain. She said "the pain pills werent even touching the pain." last bm was yesterday and it was normal. She has 4 days left on the xifaxan, it has helped the diarrhea but not the pain. No N/V, no fever, no blood in her stool. She said when she sits down and tries to get back up the pain "grabs" her . She is very concerned and wants to know what she needs to do.

## 2014-11-11 NOTE — Telephone Encounter (Signed)
Pt called stating her left side is still hurting way down low, pt is concerned on what she needs to do now. Please advise

## 2014-11-11 NOTE — Progress Notes (Signed)
cc'd to pcp 

## 2014-11-12 NOTE — Telephone Encounter (Signed)
Please let patient know that I discussed her symptoms with Dr. Oneida Alar. She suggested that your LLQ pain may not be GI, ?referred from back or hip?  She suggested complete Xifaxan. Return to see RMR, please make appt. He may consider if flex sig would be needed to further evaluate LLQ pain. Consider discussing possibility of her pain being from her back or hip with PCP. No documented diverticulitis on last two CTs and no improvement with antibiotics.

## 2014-11-13 ENCOUNTER — Encounter: Payer: Self-pay | Admitting: Internal Medicine

## 2014-11-13 NOTE — Telephone Encounter (Signed)
OV made for 7/19 with RMR and appt card mailed

## 2014-11-13 NOTE — Telephone Encounter (Signed)
Pt is aware. Please schedule ov with RMR.

## 2014-12-02 ENCOUNTER — Other Ambulatory Visit: Payer: Self-pay

## 2014-12-02 MED ORDER — RABEPRAZOLE SODIUM 20 MG PO TBEC: 20.0000 mg | DELAYED_RELEASE_TABLET | Freq: Every day | ORAL | Status: DC

## 2014-12-07 ENCOUNTER — Ambulatory Visit (HOSPITAL_COMMUNITY)
Admission: RE | Admit: 2014-12-07 | Discharge: 2014-12-07 | Disposition: A | Discharge status: Home / Self Care | Payer: Medicare Other | Source: Ambulatory Visit | Attending: Internal Medicine | Admitting: Internal Medicine

## 2014-12-07 DIAGNOSIS — Z1231 Encounter for screening mammogram for malignant neoplasm of breast: Secondary | ICD-10-CM | POA: Diagnosis not present

## 2014-12-09 ENCOUNTER — Other Ambulatory Visit: Payer: Self-pay | Admitting: Internal Medicine

## 2014-12-09 DIAGNOSIS — R928 Other abnormal and inconclusive findings on diagnostic imaging of breast: Secondary | ICD-10-CM

## 2014-12-14 DIAGNOSIS — E119 Type 2 diabetes mellitus without complications: Secondary | ICD-10-CM | POA: Diagnosis not present

## 2014-12-14 DIAGNOSIS — Z79899 Other long term (current) drug therapy: Secondary | ICD-10-CM | POA: Diagnosis not present

## 2014-12-14 DIAGNOSIS — E785 Hyperlipidemia, unspecified: Secondary | ICD-10-CM | POA: Diagnosis not present

## 2014-12-14 DIAGNOSIS — K219 Gastro-esophageal reflux disease without esophagitis: Secondary | ICD-10-CM | POA: Diagnosis not present

## 2014-12-14 DIAGNOSIS — E039 Hypothyroidism, unspecified: Secondary | ICD-10-CM | POA: Diagnosis not present

## 2014-12-14 DIAGNOSIS — G464 Cerebellar stroke syndrome: Secondary | ICD-10-CM | POA: Diagnosis not present

## 2014-12-15 ENCOUNTER — Ambulatory Visit (INDEPENDENT_AMBULATORY_CARE_PROVIDER_SITE_OTHER): Payer: Medicare Other | Admitting: Internal Medicine

## 2014-12-15 ENCOUNTER — Ambulatory Visit (HOSPITAL_COMMUNITY)
Admission: RE | Admit: 2014-12-15 | Discharge: 2014-12-15 | Disposition: A | Discharge status: Home / Self Care | Payer: Medicare Other | Source: Ambulatory Visit | Attending: Internal Medicine | Admitting: Internal Medicine

## 2014-12-15 ENCOUNTER — Other Ambulatory Visit: Payer: Self-pay | Admitting: Internal Medicine

## 2014-12-15 ENCOUNTER — Encounter: Payer: Self-pay | Admitting: Internal Medicine

## 2014-12-15 VITALS — BP 135/70 | HR 70 | Temp 97.0°F | Ht 60.0 in | Wt 111.8 lb

## 2014-12-15 DIAGNOSIS — M25552 Pain in left hip: Secondary | ICD-10-CM

## 2014-12-15 DIAGNOSIS — K589 Irritable bowel syndrome without diarrhea: Secondary | ICD-10-CM

## 2014-12-15 DIAGNOSIS — N63 Unspecified lump in breast: Secondary | ICD-10-CM | POA: Diagnosis present

## 2014-12-15 DIAGNOSIS — K219 Gastro-esophageal reflux disease without esophagitis: Secondary | ICD-10-CM | POA: Diagnosis not present

## 2014-12-15 DIAGNOSIS — R928 Other abnormal and inconclusive findings on diagnostic imaging of breast: Secondary | ICD-10-CM

## 2014-12-15 DIAGNOSIS — N641 Fat necrosis of breast: Secondary | ICD-10-CM | POA: Diagnosis not present

## 2014-12-15 NOTE — Patient Instructions (Addendum)
Continue Aciphex 20 mg daily (as discussed, the benefits outweigh the risks)  Information on IBS-D provided  See Dr. Willey Blade regarding left sided pelvic pain and abnormality in left hip bone on CT  Use Imodium as needed for occasional diarrhea  Office visit her in 6 months

## 2014-12-15 NOTE — Progress Notes (Signed)
Primary Care Physician:  Asencion Noble, MD Primary Gastroenterologist:  Dr. Gala Romney  Pre-Procedure History & Physical: HPI:  Angela Galvan is an 81 y.o. female here for follow-up of GERD and IBS-D.  Chronic left lower quadrant/ left pelvic pain. Rates a 4/10. Patient reminds me she has multiple degenerative disc in her back. She has a lytic lesion in her proximal femur on the left which is a chronic finding. Multiple rounds of antibiotics for diverticulitis but no diverticulitis on recent CTs. We did give her a course of Xifaxan for irritable bowel syndrome. She states that since her last office visit, only 2 bouts of diarrhea; otherwise, bowel function has been good. GERD symptoms well controlled on AcipHex 20 mg daily.  Imodium effective for occasional diarrhea.  No longer taking Questran at our request  Past Medical History  Diagnosis Date  . Diverticulitis     s/p colectomy in 1991  . IBS (irritable bowel syndrome)   . Hyperlipidemia   . S/P colonoscopy 2003    minimal internal hemorrhoids, pancolonic diverticula, biopsy of rectum: lymphoplasmacytic microscopic colitis, colon biopsies normal  . S/P colonoscopy 2012    pancolonic diverticula, nl TI, external hemorrhoidal tag,/rectum bx no abnormalities  . S/P endoscopy 2012    normal esophagus, stenotic pyloric channel, TTS dilation  . Diarrhea   . GERD (gastroesophageal reflux disease)   . Arthritis   . Hypothyroidism   . Diabetes mellitus     no meds.-diet controlled  . HOH (hard of hearing)   . Anxiety   . HTN (hypertension)     Does not see a cardiologist  . Stroke 08/10/2006    no residual  . Cancer     breast cancer  . 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 Colectomy    . Cholecystectomy    . Right ovarian tumor removed as teenager    . Knee surgery Bilateral     2004- [place]-  total; knee  . Bilateral cataracts    . Esophagogastroduodenoscopy  10/11/2010    Normal esophagus, small hiatal hernia,Stenotic pyloric channel likely critical.  Likely contributing to some of the patient's symptoms, status post TTS balloon dilation Normal D1-D3 status post biopsy.  D2-D3 atrophic-appearing gastric mucosa status post biopsy  . Ileocolonoscopy  10/11/2010    external hemorrhoidal tag.  Normal rectum status post biopsy Pancolonic diverticula status post segmental biopsy.  Normal  terminal ileum  . Colonoscopy  09/11/01    Minimal internal hemorrhoids, otherwise normal rectum . Scattered pancolonic diverticula (few) The colonic mucosa otherwise appeared normal status post biopsies and stool sampling as described above.  . Total knee arthroplasty  09/25/2011    Procedure: TOTAL KNEE ARTHROPLASTY;  Surgeon: Gearlean Alf, MD;  Location: [facility];  Service: Orthopedics;  Laterality: Left;  . Breast biopsy Left 01/10/2013    Procedure: BREAST BIOPSY;  Surgeon: Jamesetta So, MD;  Location: [facility];  Service: General;  Laterality: Left;  . Knee arthroplasty Right 2004  . Abdominal hysterectomy  1983  . Simple mastectomy with axillary sentinel node biopsy Left 03/07/2013    Procedure: TOTAL  MASTECTOMY WITH AXILLARY SENTINEL NODE BIOPSY;  Surgeon: Adin Hector, MD;  Location: [facility];  Service: General;  Laterality: Left;    Prior to Admission medications   Medication Sig Start Date End  Date Taking? Authorizing Provider  acetaminophen (TYLENOL) 500 MG tablet Take 500-1,000 mg by mouth every 6 (six) hours as needed. Pain    Yes Historical Provider, MD  ALPRAZolam (XANAX) 0.5 MG tablet Take 0.5 mg by mouth 2 (two) times daily as needed for sleep or anxiety.   Yes Historical Provider, MD  aspirin 325 MG tablet Take 325 mg by mouth daily.   Yes Historical Provider, MD  atorvastatin (LIPITOR) 10 MG tablet Take 10 mg by mouth daily after breakfast.   Yes Historical Provider, MD    citalopram (CELEXA) 20 MG tablet Take 20 mg by mouth daily after breakfast.   Yes Historical Provider, MD  FLUoxetine (PROZAC) 20 MG capsule Take 20 mg by mouth daily.   Yes Historical Provider, MD  HYDROcodone-acetaminophen (NORCO/VICODIN) 5-325 MG per tablet Take 1 tablet by mouth every 6 (six) hours as needed for moderate pain. 10/29/14  Yes Mahala Menghini, PA-C  levothyroxine (SYNTHROID, LEVOTHROID) 50 MCG tablet Take 50 mcg by mouth daily before breakfast.    Yes Historical Provider, MD  losartan (COZAAR) 50 MG tablet Take 100 mg by mouth daily.    Yes Historical Provider, MD  Probiotic Product (ALIGN PO) Take by mouth.   Yes Historical Provider, MD  RABEprazole (ACIPHEX) 20 MG tablet Take 1 tablet (20 mg total) by mouth daily. 12/02/14  Yes Orvil Feil, NP  RESTASIS 0.05 % ophthalmic emulsion Place 1 drop into both eyes every 12 (twelve) hours.  01/11/11  Yes Historical Provider, MD  UNABLE TO FIND Rx: [place] (Quantity: 6) B5102- Silicone Breast Prosthesis (Quantity: 1) Dx: 174.9; Left Mastectomy 05/01/13  Yes Alphonsa Overall, MD  UNABLE TO FIND Rx: [phone]- Mastectomy Form, left (Quantity: 1) Dx: 174.9; Left mastectomy 07/01/13  Yes Fanny Skates, MD  cholestyramine Lucrezia Starch) 4 GM/DOSE powder Take 1 to 2 grams twice daily as directed.   Pt only takes as needed/ said it causes constipation if she takes it regularly 09/19/11   Mahala Menghini, PA-C  NON FORMULARY Hydrocodone      Not sure strength    Has only taken a couple since July for herniated disc    Historical Provider, MD  rifaximin (XIFAXAN) 550 MG TABS tablet Take 1 tablet (550 mg total) by mouth 3 (three) times daily. Patient not taking: Reported on 12/15/2014 10/29/14   Mahala Menghini, PA-C    Allergies as of 12/15/2014  . (No Known Allergies)    Family History  Problem Relation Age of Onset  . Colon cancer Neg Hx   . Stroke Maternal Grandmother     deceased after 3 strokes  . Stroke Mother     deceased after 3  strokes 09/11/99  . Stroke Sister     twin sister  . Diabetes Father     History   Social History  . Marital Status: Married    Spouse Name: N/A  . Number of Children: N/A  . Years of Education: N/A   Occupational History  . retired    Social History Main Topics  . Smoking status: Never Smoker   . Smokeless tobacco: Never Used     Comment: Never smoked  . Alcohol Use: Yes     Comment: occassional wine  . Drug Use: No  . Sexual Activity: No   Other Topics Concern  . Not on file   Social History Narrative    Review of Systems: See HPI, otherwise negative ROS  Physical Exam: BP 135/70  mmHg  Pulse 70  Temp(Src) 97 F (36.1 C)  Ht 5' (1.524 m)  Wt 111 lb 12.8 oz (50.712 kg)  BMI 21.83 kg/m2 General:   Alert,   pleasant and cooperative in NAD Neck:  Supple; no masses or thyromegaly. No significant cervical adenopathy. Lungs:  Clear throughout to auscultation.   No wheezes, crackles, or rhonchi. No acute distress. Heart:  Regular rate and rhythm; no murmurs, clicks, rubs,  or gallops. Abdomen: Non-distended, normal bowel sounds.  Soft and nontender without appreciable mass or hepatosplenomegaly.  Pulses:  Normal pulses noted. Extremities:  Without clubbing or edema. Some point tenderness over her left pelvis.    Impression:  81 year old lady with GERD well-controlled on AcipHex. We talked about the long-term risk of acid suppression therapy. In this lady's case, I feel the benefits far outweigh the risk. She has an IBS D. Symptoms are quiescent at this time.  Course of Xifaxan appears to have been beneficial for her IBS flare.  Left lower quadrant "abdominal pain" may well be emanating from her pelvis/hip joint. Left proximal lytic lesion in her femur keeps coming up on the x-rays. This is a chronic finding, however. She has degenerative joint disease her back as well. I am becoming more suspicious that her symptoms in this area may be actually coming from her  musculoskeletal system rather than from her GI tract.  Recommendations:   Continue Aciphex 20 mg daily (as discussed, the benefits outweigh the risks)  Information on IBS-D provided  See Dr. Willey Blade regarding left sided pelvic pain and abnormality in left hip bone on CT  Use Imodium as needed for occasional diarrhea  Office visit her in 6 months    Notice: This dictation was prepared with Dragon dictation along with smaller phrase technology. Any transcriptional errors that result from this process are unintentional and may not be corrected upon review.

## 2014-12-22 DIAGNOSIS — E1129 Type 2 diabetes mellitus with other diabetic kidney complication: Secondary | ICD-10-CM | POA: Diagnosis not present

## 2014-12-22 DIAGNOSIS — Z6821 Body mass index (BMI) 21.0-21.9, adult: Secondary | ICD-10-CM | POA: Diagnosis not present

## 2014-12-22 DIAGNOSIS — I10 Essential (primary) hypertension: Secondary | ICD-10-CM | POA: Diagnosis not present

## 2014-12-22 DIAGNOSIS — E785 Hyperlipidemia, unspecified: Secondary | ICD-10-CM | POA: Diagnosis not present

## 2015-01-21 ENCOUNTER — Other Ambulatory Visit: Payer: Self-pay | Admitting: Orthopedic Surgery

## 2015-01-21 ENCOUNTER — Ambulatory Visit (INDEPENDENT_AMBULATORY_CARE_PROVIDER_SITE_OTHER): Payer: Medicare Other

## 2015-01-21 DIAGNOSIS — R52 Pain, unspecified: Secondary | ICD-10-CM

## 2015-01-21 DIAGNOSIS — M25552 Pain in left hip: Secondary | ICD-10-CM

## 2015-01-21 DIAGNOSIS — M25522 Pain in left elbow: Secondary | ICD-10-CM | POA: Diagnosis not present

## 2015-01-22 ENCOUNTER — Other Ambulatory Visit (HOSPITAL_COMMUNITY): Payer: Self-pay | Admitting: Orthopedic Surgery

## 2015-01-22 DIAGNOSIS — M25552 Pain in left hip: Secondary | ICD-10-CM

## 2015-01-26 ENCOUNTER — Other Ambulatory Visit: Payer: Self-pay | Admitting: General Surgery

## 2015-01-26 DIAGNOSIS — Z9012 Acquired absence of left breast and nipple: Secondary | ICD-10-CM | POA: Diagnosis not present

## 2015-01-26 DIAGNOSIS — E119 Type 2 diabetes mellitus without complications: Secondary | ICD-10-CM | POA: Diagnosis not present

## 2015-01-26 DIAGNOSIS — I10 Essential (primary) hypertension: Secondary | ICD-10-CM | POA: Diagnosis not present

## 2015-01-26 DIAGNOSIS — Z8673 Personal history of transient ischemic attack (TIA), and cerebral infarction without residual deficits: Secondary | ICD-10-CM | POA: Diagnosis not present

## 2015-01-26 DIAGNOSIS — Z853 Personal history of malignant neoplasm of breast: Secondary | ICD-10-CM | POA: Diagnosis not present

## 2015-01-26 DIAGNOSIS — N63 Unspecified lump in breast: Secondary | ICD-10-CM | POA: Diagnosis not present

## 2015-01-27 ENCOUNTER — Encounter (HOSPITAL_BASED_OUTPATIENT_CLINIC_OR_DEPARTMENT_OTHER)
Admission: RE | Admit: 2015-01-27 | Discharge: 2015-01-27 | Disposition: A | Discharge status: Home / Self Care | Payer: Medicare Other | Source: Ambulatory Visit | Attending: General Surgery | Admitting: General Surgery

## 2015-01-27 ENCOUNTER — Encounter (HOSPITAL_BASED_OUTPATIENT_CLINIC_OR_DEPARTMENT_OTHER): Payer: Self-pay | Admitting: *Deleted

## 2015-01-27 DIAGNOSIS — N63 Unspecified lump in breast: Secondary | ICD-10-CM | POA: Diagnosis present

## 2015-01-27 DIAGNOSIS — Z853 Personal history of malignant neoplasm of breast: Secondary | ICD-10-CM | POA: Diagnosis not present

## 2015-01-27 DIAGNOSIS — Z9012 Acquired absence of left breast and nipple: Secondary | ICD-10-CM | POA: Diagnosis not present

## 2015-01-27 DIAGNOSIS — D241 Benign neoplasm of right breast: Secondary | ICD-10-CM | POA: Diagnosis not present

## 2015-01-27 DIAGNOSIS — Z171 Estrogen receptor negative status [ER-]: Secondary | ICD-10-CM | POA: Diagnosis not present

## 2015-01-27 DIAGNOSIS — Z8673 Personal history of transient ischemic attack (TIA), and cerebral infarction without residual deficits: Secondary | ICD-10-CM | POA: Diagnosis not present

## 2015-01-27 DIAGNOSIS — I10 Essential (primary) hypertension: Secondary | ICD-10-CM | POA: Diagnosis not present

## 2015-01-27 LAB — COMPREHENSIVE METABOLIC PANEL
ALK PHOS: 83 U/L (ref 38–126)
ALT: 29 U/L (ref 14–54)
ANION GAP: 9 (ref 5–15)
AST: 29 U/L (ref 15–41)
Albumin: 4 g/dL (ref 3.5–5.0)
BILIRUBIN TOTAL: 0.8 mg/dL (ref 0.3–1.2)
BUN: 19 mg/dL (ref 6–20)
CALCIUM: 9.3 mg/dL (ref 8.9–10.3)
CO2: 23 mmol/L (ref 22–32)
Chloride: 106 mmol/L (ref 101–111)
Creatinine, Ser: 0.85 mg/dL (ref 0.44–1.00)
GLUCOSE: 107 mg/dL — AB (ref 65–99)
POTASSIUM: 4.2 mmol/L (ref 3.5–5.1)
Sodium: 138 mmol/L (ref 135–145)
TOTAL PROTEIN: 6.5 g/dL (ref 6.5–8.1)

## 2015-01-27 LAB — CBC WITH DIFFERENTIAL/PLATELET
BASOS PCT: 0 % (ref 0–1)
Basophils Absolute: 0 10*3/uL (ref 0.0–0.1)
Eosinophils Absolute: 0.1 10*3/uL (ref 0.0–0.7)
Eosinophils Relative: 1 % (ref 0–5)
HEMATOCRIT: 37.3 % (ref 36.0–46.0)
HEMOGLOBIN: 12.6 g/dL (ref 12.0–15.0)
Lymphocytes Relative: 34 % (ref 12–46)
Lymphs Abs: 2.4 10*3/uL (ref 0.7–4.0)
MCH: 30.8 pg (ref 26.0–34.0)
MCHC: 33.8 g/dL (ref 30.0–36.0)
MCV: 91.2 fL (ref 78.0–100.0)
Monocytes Absolute: 0.8 10*3/uL (ref 0.1–1.0)
Monocytes Relative: 12 % (ref 3–12)
Neutro Abs: 3.7 10*3/uL (ref 1.7–7.7)
Neutrophils Relative %: 53 % (ref 43–77)
Platelets: 334 10*3/uL (ref 150–400)
RBC: 4.09 MIL/uL (ref 3.87–5.11)
RDW: 13.4 % (ref 11.5–15.5)
WBC: 7 10*3/uL (ref 4.0–10.5)

## 2015-01-27 NOTE — H&P (Signed)
Angela Galvan  Location: [facility] Patient #: [id number] DOB: Nov 05, 1932 Married / Language: English / Race: White Female        History of Present Illness  The patient is a 81 year old female who presents with a breast mass. This patient is returned to me by Dr. Asencion Noble for evaluation of a palpable mass in her right breast.       She is a 81 year old female from [place]. March 07, 2013 I performed a left total mastectomy and sentinel lymph node biopsy. 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 She is being followed with observation only by Dr. Lindi Adie.        Screening mammograms were performed recently at [facility] [place]. They brought her back for second look  ultrasound for a palpable mass in the upper outer quadrant of the right breast. Dr. Radford Pax read this as benign fat necrosis. The patient and her husband are quite concerned because of the new firm lump which is a dominant mass and her history of breast cancer. There is no history of trauma or bruising to this area. This is a new finding.          We have decided to go ahead and conservatively excised this. We did talk about the options of observation in 6 month follow-up, referral back to radiology for image guided biopsy, or simple excision. We choose simple excision. I discussed the indications, details, techniques, and numerous risk of lumpectomy with the patient and her husband. She is with risk of bleeding, infection, cosmetic deformity, nerve damage with chronic pain or numbness. Further surgery if this turns out to be cancer, and other unforeseen problems. She understands all of these issues. All of her questions were answered. She agrees with this plan.   Allergies  No Known Drug Allergies12/11/2013  Medication History  Belsomra (10MG  Tablet, Oral) Active. Restasis (0.05% Emulsion, Ophthalmic)  Active. Xifaxan (550MG  Tablet, Oral) Active. Synthroid (50MCG Tablet, Oral) Active. Losartan Potassium (100MG  Tablet, Oral) Active. FLUoxetine HCl (20MG  Capsule, Oral) Active. Atorvastatin Calcium (10MG  Tablet, Oral) Active. RABEprazole Sodium (20MG  Tablet DR, Oral) Active. Losartan Potassium (50MG  Tablet, Oral) Active. Hydrocodone-Acetaminophen (5-325MG  Tablet, Oral) Active. Aspirin EC (325MG  Tablet DR, Oral) Active. Medications Reconciled  Vitals   Weight: 111 lb Height: 60in Body Surface Area: 1.46 m Body Mass Index: 21.68 kg/m Temp.: 97.83F(Temporal)  Pulse: 80 (Regular)  BP: 122/68 (Sitting, Left Arm, Standard)    Physical Exam  General Mental Status-Alert. General Appearance-Not in acute distress. Build & Nutrition-Well nourished. Posture-Normal posture. Gait-Normal.  Head and Neck Head-normocephalic, atraumatic with no lesions or palpable masses. Trachea-midline. Thyroid Gland Characteristics - normal size and consistency and no palpable nodules.  Chest and Lung Exam Chest and lung exam reveals -on auscultation, normal breath sounds, no adventitious sounds and normal vocal resonance.  Breast Note: Left mastectomy scar is well-healed. No nodules or ulceration. Axilla feels normal. Good range of motion left shoulder. No swelling. Right breast reveals a firm but smooth dominant mass in the upper outer quadrant about 5 or 6 cm from the areolar margin. Slight erythema from repeated exams. Certainly more firm than a cyst. No other mass in the right breast. No axillary adenopathy.   Cardiovascular Cardiovascular examination reveals -normal heart sounds, regular rate and rhythm with no murmurs and femoral artery auscultation bilaterally reveals normal pulses, no bruits, no thrills.  Abdomen Inspection Inspection of the abdomen reveals -  No Hernias. Palpation/Percussion Palpation and Percussion of the abdomen reveal - Soft,  Non Tender, No Rigidity (guarding), No hepatosplenomegaly and No Palpable abdominal masses.  Neurologic Neurologic evaluation reveals -alert and oriented x 3 with no impairment of recent or remote memory, normal attention span and ability to concentrate, normal sensation and normal coordination.  Musculoskeletal Normal Exam - Bilateral-Upper Extremity Strength Normal and Lower Extremity Strength Normal.    Assessment & Plan  BREAST MASS, RIGHT (611.72  N63)   Schedule for Surgery. We can feel the lump in your right breast, and it does feel firm and is of some concern, especially considering your history of cancer on the left side The mammogram suggested this is a benign scarring called fat necrosis The lump is probably benign, but I cannot be sure Because of your history of breast cancer we are going to schedule conservative excision of this mass in the operating room so that we will be completely sure what is going on. We have discussed the indications, techniques, and numerous risk of the surgery in detail Please read the printed information that I gave you.  Pt Education - [facility] Breast Biopsy HCI: discussed with patient and provided information.   HISTORY OF LEFT BREAST CANCER (V10.3  Z85.3) HISTORY OF LEFT MASTECTOMY (V45.71  Z90.12) HISTORY OF CVA (CEREBROVASCULAR ACCIDENT) WITHOUT RESIDUAL DEFICITS (V12.54  Z86.73) HYPERTENSION, BENIGN (401.1  I10) DIET-CONTROLLED DIABETES MELLITUS (250.00  E11.9)    Lorrena Goranson M. Dalbert Batman, M.D., [facility] General and Minimally invasive Surgery Breast and Colorectal Surgery Office:   [phone] Pager:   [phone]

## 2015-01-29 ENCOUNTER — Ambulatory Visit (HOSPITAL_BASED_OUTPATIENT_CLINIC_OR_DEPARTMENT_OTHER): Payer: Medicare Other | Admitting: Anesthesiology

## 2015-01-29 ENCOUNTER — Encounter (HOSPITAL_BASED_OUTPATIENT_CLINIC_OR_DEPARTMENT_OTHER)
Admission: RE | Disposition: A | Discharge status: Home / Self Care | Payer: Self-pay | Source: Ambulatory Visit | Attending: General Surgery

## 2015-01-29 ENCOUNTER — Ambulatory Visit (HOSPITAL_BASED_OUTPATIENT_CLINIC_OR_DEPARTMENT_OTHER)
Admission: RE | Admit: 2015-01-29 | Discharge: 2015-01-29 | Disposition: A | Discharge status: Home / Self Care | Payer: Medicare Other | Source: Ambulatory Visit | Attending: General Surgery | Admitting: General Surgery

## 2015-01-29 ENCOUNTER — Encounter (HOSPITAL_BASED_OUTPATIENT_CLINIC_OR_DEPARTMENT_OTHER): Payer: Self-pay | Admitting: *Deleted

## 2015-01-29 DIAGNOSIS — D241 Benign neoplasm of right breast: Secondary | ICD-10-CM | POA: Insufficient documentation

## 2015-01-29 DIAGNOSIS — Z171 Estrogen receptor negative status [ER-]: Secondary | ICD-10-CM | POA: Diagnosis not present

## 2015-01-29 DIAGNOSIS — Z853 Personal history of malignant neoplasm of breast: Secondary | ICD-10-CM | POA: Insufficient documentation

## 2015-01-29 DIAGNOSIS — I10 Essential (primary) hypertension: Secondary | ICD-10-CM | POA: Insufficient documentation

## 2015-01-29 DIAGNOSIS — N641 Fat necrosis of breast: Secondary | ICD-10-CM | POA: Diagnosis not present

## 2015-01-29 DIAGNOSIS — Z8673 Personal history of transient ischemic attack (TIA), and cerebral infarction without residual deficits: Secondary | ICD-10-CM | POA: Insufficient documentation

## 2015-01-29 DIAGNOSIS — Z9012 Acquired absence of left breast and nipple: Secondary | ICD-10-CM | POA: Insufficient documentation

## 2015-01-29 DIAGNOSIS — N63 Unspecified lump in breast: Secondary | ICD-10-CM | POA: Diagnosis not present

## 2015-01-29 DIAGNOSIS — N6310 Unspecified lump in the right breast, unspecified quadrant: Secondary | ICD-10-CM | POA: Diagnosis present

## 2015-01-29 HISTORY — PX: BREAST LUMPECTOMY: SHX2

## 2015-01-29 HISTORY — DX: Major depressive disorder, single episode, unspecified: F32.9

## 2015-01-29 HISTORY — DX: Depression, unspecified: F32.A

## 2015-01-29 LAB — GLUCOSE, CAPILLARY: GLUCOSE-CAPILLARY: 117 mg/dL — AB (ref 65–99)

## 2015-01-29 SURGERY — BREAST LUMPECTOMY
Anesthesia: General | Site: Breast | Laterality: Right

## 2015-01-29 MED ORDER — GLYCOPYRROLATE 0.2 MG/ML IJ SOLN
INTRAMUSCULAR | Status: AC
  Filled 2015-01-29: qty 1

## 2015-01-29 MED ORDER — DEXAMETHASONE SODIUM PHOSPHATE 10 MG/ML IJ SOLN
INTRAMUSCULAR | Status: AC
  Filled 2015-01-29: qty 1

## 2015-01-29 MED ORDER — MIDAZOLAM HCL 2 MG/2ML IJ SOLN: 1.0000 mg | INTRAMUSCULAR | Status: DC | PRN

## 2015-01-29 MED ORDER — ONDANSETRON HCL 4 MG/2ML IJ SOLN
INTRAMUSCULAR | Status: AC
  Filled 2015-01-29: qty 2

## 2015-01-29 MED ORDER — CEFAZOLIN SODIUM-DEXTROSE 2-3 GM-% IV SOLR: 2.0000 g | INTRAVENOUS | Status: DC

## 2015-01-29 MED ORDER — PROPOFOL 10 MG/ML IV BOLUS
INTRAVENOUS | Status: DC | PRN
  Administered 2015-01-29: 50 mg via INTRAVENOUS
  Administered 2015-01-29: 100 mg via INTRAVENOUS

## 2015-01-29 MED ORDER — FENTANYL CITRATE (PF) 100 MCG/2ML IJ SOLN
INTRAMUSCULAR | Status: AC
  Filled 2015-01-29: qty 4

## 2015-01-29 MED ORDER — LACTATED RINGERS IV SOLN
INTRAVENOUS | Status: DC
  Administered 2015-01-29: 10 mL/h via INTRAVENOUS
  Administered 2015-01-29: 13:00:00 via INTRAVENOUS

## 2015-01-29 MED ORDER — BUPIVACAINE-EPINEPHRINE 0.5% -1:200000 IJ SOLN
INTRAMUSCULAR | Status: DC | PRN
  Administered 2015-01-29: 10 mL

## 2015-01-29 MED ORDER — DEXAMETHASONE SODIUM PHOSPHATE 4 MG/ML IJ SOLN
INTRAMUSCULAR | Status: DC | PRN
  Administered 2015-01-29: 10 mg via INTRAVENOUS

## 2015-01-29 MED ORDER — FENTANYL CITRATE (PF) 100 MCG/2ML IJ SOLN: 25.0000 ug | INTRAMUSCULAR | Status: DC | PRN

## 2015-01-29 MED ORDER — CEFAZOLIN SODIUM-DEXTROSE 2-3 GM-% IV SOLR
INTRAVENOUS | Status: AC
  Filled 2015-01-29: qty 50

## 2015-01-29 MED ORDER — LIDOCAINE HCL (CARDIAC) 20 MG/ML IV SOLN
INTRAVENOUS | Status: AC
  Filled 2015-01-29: qty 5

## 2015-01-29 MED ORDER — PROMETHAZINE HCL 25 MG/ML IJ SOLN: 6.2500 mg | INTRAMUSCULAR | Status: DC | PRN

## 2015-01-29 MED ORDER — HYDROCODONE-ACETAMINOPHEN 5-325 MG PO TABS: 1.0000 | ORAL_TABLET | Freq: Four times a day (QID) | ORAL | Status: DC | PRN

## 2015-01-29 MED ORDER — BUPIVACAINE-EPINEPHRINE (PF) 0.5% -1:200000 IJ SOLN
INTRAMUSCULAR | Status: AC
  Filled 2015-01-29: qty 30

## 2015-01-29 MED ORDER — PROPOFOL 10 MG/ML IV BOLUS
INTRAVENOUS | Status: AC
  Filled 2015-01-29: qty 20

## 2015-01-29 MED ORDER — LIDOCAINE HCL (CARDIAC) 20 MG/ML IV SOLN
INTRAVENOUS | Status: DC | PRN
Start: 2015-01-29 — End: 2015-01-29
  Administered 2015-01-29: 50 mg via INTRAVENOUS

## 2015-01-29 MED ORDER — GLYCOPYRROLATE 0.2 MG/ML IJ SOLN
0.2000 mg | Freq: Once | INTRAMUSCULAR | Status: AC | PRN
  Administered 2015-01-29: 0.2 mg via INTRAVENOUS

## 2015-01-29 MED ORDER — SCOPOLAMINE 1 MG/3DAYS TD PT72
1.0000 | MEDICATED_PATCH | Freq: Once | TRANSDERMAL | Status: DC | PRN
Start: 2015-01-29 — End: 2015-01-29

## 2015-01-29 MED ORDER — FENTANYL CITRATE (PF) 100 MCG/2ML IJ SOLN
50.0000 ug | INTRAMUSCULAR | Status: DC | PRN
  Administered 2015-01-29: 50 ug via INTRAVENOUS

## 2015-01-29 MED ORDER — CHLORHEXIDINE GLUCONATE 4 % EX LIQD
1.0000 | Freq: Once | CUTANEOUS | Status: DC
Start: 2015-01-30 — End: 2015-01-29

## 2015-01-29 SURGICAL SUPPLY — 62 items
APPLIER CLIP 9.375 MED OPEN (MISCELLANEOUS)
BENZOIN TINCTURE PRP APPL 2/3 (GAUZE/BANDAGES/DRESSINGS) IMPLANT
BINDER BREAST LRG (GAUZE/BANDAGES/DRESSINGS) IMPLANT
BINDER BREAST MEDIUM (GAUZE/BANDAGES/DRESSINGS) ×2 IMPLANT
BINDER BREAST XLRG (GAUZE/BANDAGES/DRESSINGS) IMPLANT
BINDER BREAST XXLRG (GAUZE/BANDAGES/DRESSINGS) IMPLANT
BLADE HEX COATED 2.75 (ELECTRODE) ×2 IMPLANT
BLADE SURG 15 STRL LF DISP TIS (BLADE) ×1 IMPLANT
BLADE SURG 15 STRL SS (BLADE) ×1
CANISTER SUCT 1200ML W/VALVE (MISCELLANEOUS) ×2 IMPLANT
CHLORAPREP W/TINT 26ML (MISCELLANEOUS) ×2 IMPLANT
CLIP APPLIE 9.375 MED OPEN (MISCELLANEOUS) IMPLANT
COVER BACK TABLE 60X90IN (DRAPES) ×2 IMPLANT
COVER MAYO STAND STRL (DRAPES) ×2 IMPLANT
DECANTER SPIKE VIAL GLASS SM (MISCELLANEOUS) IMPLANT
DERMABOND ADVANCED (GAUZE/BANDAGES/DRESSINGS) ×1
DERMABOND ADVANCED .7 DNX12 (GAUZE/BANDAGES/DRESSINGS) ×1 IMPLANT
DEVICE DUBIN W/COMP PLATE 8390 (MISCELLANEOUS) IMPLANT
DRAPE LAPAROSCOPIC ABDOMINAL (DRAPES) ×2 IMPLANT
DRAPE LAPAROTOMY 100X72 PEDS (DRAPES) IMPLANT
DRAPE LAPAROTOMY TRNSV 102X78 (DRAPE) IMPLANT
DRAPE UTILITY XL STRL (DRAPES) ×2 IMPLANT
DRSG PAD ABDOMINAL 8X10 ST (GAUZE/BANDAGES/DRESSINGS) ×2 IMPLANT
ELECT REM PT RETURN 9FT ADLT (ELECTROSURGICAL) ×2
ELECTRODE REM PT RTRN 9FT ADLT (ELECTROSURGICAL) ×1 IMPLANT
GAUZE SPONGE 4X4 16PLY XRAY LF (GAUZE/BANDAGES/DRESSINGS) IMPLANT
GLOVE BIOGEL PI IND STRL 7.0 (GLOVE) ×1 IMPLANT
GLOVE BIOGEL PI INDICATOR 7.0 (GLOVE) ×1
GLOVE ECLIPSE 6.5 STRL STRAW (GLOVE) ×2 IMPLANT
GLOVE EUDERMIC 7 POWDERFREE (GLOVE) ×4 IMPLANT
GLOVE EXAM NITRILE EXT CUFF MD (GLOVE) ×2 IMPLANT
GOWN STRL REUS W/ TWL LRG LVL3 (GOWN DISPOSABLE) ×1 IMPLANT
GOWN STRL REUS W/ TWL XL LVL3 (GOWN DISPOSABLE) ×1 IMPLANT
GOWN STRL REUS W/TWL LRG LVL3 (GOWN DISPOSABLE) ×1
GOWN STRL REUS W/TWL XL LVL3 (GOWN DISPOSABLE) ×1
KIT MARKER MARGIN INK (KITS) ×2 IMPLANT
NEEDLE HYPO 22GX1.5 SAFETY (NEEDLE) IMPLANT
NEEDLE HYPO 25X1 1.5 SAFETY (NEEDLE) ×2 IMPLANT
NS IRRIG 1000ML POUR BTL (IV SOLUTION) ×2 IMPLANT
PACK BASIN DAY SURGERY FS (CUSTOM PROCEDURE TRAY) ×2 IMPLANT
PENCIL BUTTON HOLSTER BLD 10FT (ELECTRODE) ×2 IMPLANT
SLEEVE SCD COMPRESS KNEE MED (MISCELLANEOUS) ×2 IMPLANT
SPONGE GAUZE 4X4 12PLY STER LF (GAUZE/BANDAGES/DRESSINGS) ×2 IMPLANT
SPONGE LAP 4X18 X RAY DECT (DISPOSABLE) ×2 IMPLANT
STAPLER VISISTAT 35W (STAPLE) IMPLANT
STRIP CLOSURE SKIN 1/2X4 (GAUZE/BANDAGES/DRESSINGS) IMPLANT
SUT ETHILON 4 0 PS 2 18 (SUTURE) IMPLANT
SUT MNCRL AB 4-0 PS2 18 (SUTURE) ×2 IMPLANT
SUT SILK 2 0 SH (SUTURE) ×2 IMPLANT
SUT VIC AB 2-0 SH 27 (SUTURE)
SUT VIC AB 2-0 SH 27XBRD (SUTURE) IMPLANT
SUT VIC AB 3-0 FS2 27 (SUTURE) IMPLANT
SUT VIC AB 4-0 P-3 18XBRD (SUTURE) IMPLANT
SUT VIC AB 4-0 P3 18 (SUTURE)
SUT VICRYL 3-0 CR8 SH (SUTURE) ×2 IMPLANT
SUT VICRYL 4-0 PS2 18IN ABS (SUTURE) IMPLANT
SYR BULB 3OZ (MISCELLANEOUS) IMPLANT
SYRINGE 10CC LL (SYRINGE) ×2 IMPLANT
TAPE HYPAFIX 4 X10 (GAUZE/BANDAGES/DRESSINGS) IMPLANT
TOWEL OR NON WOVEN STRL DISP B (DISPOSABLE) ×2 IMPLANT
TUBE CONNECTING 20X1/4 (TUBING) ×2 IMPLANT
YANKAUER SUCT BULB TIP NO VENT (SUCTIONS) ×2 IMPLANT

## 2015-01-29 NOTE — Anesthesia Procedure Notes (Signed)
Procedure Name: LMA Insertion Date/Time: 01/29/2015 1:52 PM Performed by: Lyndee Leo Pre-anesthesia Checklist: Patient identified, Emergency Drugs available, Suction available and Patient being monitored Patient Re-evaluated:Patient Re-evaluated prior to inductionOxygen Delivery Method: Circle System Utilized Preoxygenation: Pre-oxygenation with 100% oxygen Intubation Type: IV induction Ventilation: Mask ventilation without difficulty LMA: LMA inserted LMA Size: 3.0 Number of attempts: 1 Airway Equipment and Method: Bite block Placement Confirmation: positive ETCO2 Tube secured with: Tape Dental Injury: Teeth and Oropharynx as per pre-operative assessment

## 2015-01-29 NOTE — Discharge Instructions (Signed)
[facility] °Office Phone Number [phone] ° °BREAST BIOPSY/ PARTIAL MASTECTOMY: POST OP INSTRUCTIONS ° °Always review your discharge instruction sheet given to you by the facility where your surgery was performed. ° °IF YOU HAVE DISABILITY OR FAMILY LEAVE FORMS, YOU MUST BRING THEM TO THE OFFICE FOR PROCESSING.  DO NOT GIVE THEM TO YOUR DOCTOR. ° °1. A prescription for pain medication may be given to you upon discharge.  Take your pain medication as prescribed, if needed.  If narcotic pain medicine is not needed, then you may take acetaminophen (Tylenol) or ibuprofen (Advil) as needed. °2. Take your usually prescribed medications unless otherwise directed °3. If you need a refill on your pain medication, please contact your pharmacy.  They will contact our office to request authorization.  Prescriptions will not be filled after 5pm or on week-ends. °4. You should eat very light the first 24 hours after surgery, such as soup, crackers, pudding, etc.  Resume your normal diet the day after surgery. °5. Most patients will experience some swelling and bruising in the breast.  Ice packs and a good support bra will help.  Swelling and bruising can take several days to resolve.  °6. It is common to experience some constipation if taking pain medication after surgery.  Increasing fluid intake and taking a stool softener will usually help or prevent this problem from occurring.  A mild laxative (Milk of Magnesia or Miralax) should be taken according to package directions if there are no bowel movements after 48 hours. °7. Unless discharge instructions indicate otherwise, you may remove your bandages 24-48 hours after surgery, and you may shower at that time.  You may have steri-strips (small skin tapes) in place directly over the incision.  These strips should be left on the skin for 7-10 days.  If your surgeon used skin glue on the incision, you may shower in 24 hours.  The glue will flake off over the  next 2-3 weeks.  Any sutures or staples will be removed at the office during your follow-up visit. °8. ACTIVITIES:  You may resume regular daily activities (gradually increasing) beginning the next day.  Wearing a good support bra or sports bra minimizes pain and swelling.  You may have sexual intercourse when it is comfortable. °a. You may drive when you no longer are taking prescription pain medication, you can comfortably wear a seatbelt, and you can safely maneuver your car and apply brakes. °b. RETURN TO WORK:  ______________________________________________________________________________________ °9. You should see your doctor in the office for a follow-up appointment approximately two weeks after your surgery.  Your doctor’s nurse will typically make your follow-up appointment when she calls you with your pathology report.  Expect your pathology report 2-3 business days after your surgery.  You may call to check if you do not hear from us after three days. °10. OTHER INSTRUCTIONS: _______________________________________________________________________________________________ _____________________________________________________________________________________________________________________________________ °_____________________________________________________________________________________________________________________________________ °_____________________________________________________________________________________________________________________________________ ° °WHEN TO CALL YOUR DOCTOR: °1. Fever over 101.0 °2. Nausea and/or vomiting. °3. Extreme swelling or bruising. °4. Continued bleeding from incision. °5. Increased pain, redness, or drainage from the incision. ° °The clinic staff is available to answer your questions during regular business hours.  Please don’t hesitate to call and ask to speak to one of the nurses for clinical concerns.  If you have a medical emergency, go to the nearest  emergency room or call 911.  A surgeon from [facility] is always on call at the hospital. ° °For further questions, please visit [email or web address]  ° ° ° °  Post Anesthesia Home Care Instructions ° °Activity: °Get plenty of rest for the remainder of the day. A responsible adult should stay with you for 24 hours following the procedure.  °For the next 24 hours, DO NOT: °-Drive a car °-Operate machinery °-Drink alcoholic beverages °-Take any medication unless instructed by your physician °-Make any legal decisions or sign important papers. ° °Meals: °Start with liquid foods such as gelatin or soup. Progress to regular foods as tolerated. Avoid greasy, spicy, heavy foods. If nausea and/or vomiting occur, drink only clear liquids until the nausea and/or vomiting subsides. Call your physician if vomiting continues. ° °Special Instructions/Symptoms: °Your throat may feel dry or sore from the anesthesia or the breathing tube placed in your throat during surgery. If this causes discomfort, gargle with warm salt water. The discomfort should disappear within 24 hours. ° °If you had a scopolamine patch placed behind your ear for the management of post- operative nausea and/or vomiting: ° °1. The medication in the patch is effective for 72 hours, after which it should be removed.  Wrap patch in a tissue and discard in the trash. Wash hands thoroughly with soap and water. °2. You may remove the patch earlier than 72 hours if you experience unpleasant side effects which may include dry mouth, dizziness or visual disturbances. °3. Avoid touching the patch. Wash your hands with soap and water after contact with the patch. °  ° °

## 2015-01-29 NOTE — Transfer of Care (Signed)
Immediate Anesthesia Transfer of Care Note  Patient: Angela Galvan  Procedure(s) Performed: Procedure(s): RIGHT BREAST LUMPECTOMY (Right)  Patient Location: PACU  Anesthesia Type:General  Level of Consciousness: awake, oriented and patient cooperative  Airway & Oxygen Therapy: Patient Spontanous Breathing and Patient connected to face mask oxygen  Post-op Assessment: Report given to RN and Post -op Vital signs reviewed and stable  Post vital signs: Reviewed and stable  Last Vitals:  Filed Vitals:   01/29/15 1209  BP: 188/60  Pulse: 60  Temp: 36.4 C  Resp: 16    Complications: No apparent anesthesia complications

## 2015-01-29 NOTE — Anesthesia Preprocedure Evaluation (Signed)
Anesthesia Evaluation  Patient identified by MRN, date of birth, ID band Patient awake    Reviewed: Allergy & Precautions, NPO status , Patient's Chart, lab work & pertinent test results  Airway Mallampati: II  TM Distance: >3 FB Neck ROM: Full    Dental no notable dental hx.    Pulmonary neg pulmonary ROS,  breath sounds clear to auscultation  Pulmonary exam normal       Cardiovascular hypertension, Pt. on medications Normal cardiovascular examRhythm:Regular Rate:Normal     Neuro/Psych CVA, No Residual Symptoms negative psych ROS   GI/Hepatic negative GI ROS, Neg liver ROS,   Endo/Other  diabetesHypothyroidism   Renal/GU negative Renal ROS  negative genitourinary   Musculoskeletal negative musculoskeletal ROS (+)   Abdominal   Peds negative pediatric ROS (+)  Hematology negative hematology ROS (+)   Anesthesia Other Findings   Reproductive/Obstetrics negative OB ROS                             Anesthesia Physical Anesthesia Plan  ASA: III  Anesthesia Plan: General   Post-op Pain Management:    Induction: Intravenous  Airway Management Planned: LMA  Additional Equipment:   Intra-op Plan:   Post-operative Plan: Extubation in OR  Informed Consent: I have reviewed the patients History and Physical, chart, labs and discussed the procedure including the risks, benefits and alternatives for the proposed anesthesia with the patient or authorized representative who has indicated his/her understanding and acceptance.   Dental advisory given  Plan Discussed with: CRNA and Surgeon  Anesthesia Plan Comments:         Anesthesia Quick Evaluation

## 2015-01-29 NOTE — Anesthesia Postprocedure Evaluation (Signed)
  Anesthesia Post-op Note  Patient: Angela Galvan  Procedure(s) Performed: Procedure(s): RIGHT BREAST LUMPECTOMY (Right)  Patient Location: PACU  Anesthesia Type:General  Level of Consciousness: awake and alert   Airway and Oxygen Therapy: Patient Spontanous Breathing  Post-op Pain: none  Post-op Assessment: Post-op Vital signs reviewed              Post-op Vital Signs: Reviewed  Last Vitals:  Filed Vitals:   01/29/15 1543  BP: 153/69  Pulse: 79  Temp: 36.5 C  Resp: 18    Complications: No apparent anesthesia complications

## 2015-01-29 NOTE — Op Note (Addendum)
Patient Name:           Angela Galvan   Date of Surgery:        01/29/2015  Pre op Diagnosis:      Right breast mass, upper outer quadrant                                      History left total mastectomy for breast cancer  Post op Diagnosis:    Same  Procedure:                 Right breast lumpectomy with margin assessment  Surgeon:                     Edsel Petrin. Dalbert Batman, M.D., FACS  Assistant:                      OR staff  Operative Indications:   . This patient is returned to me by Dr. Asencion Noble for evaluation of a palpable mass in her right breast.   She is a 81 year old female from [place]. On March 07, 2013 I performed a left total mastectomy and sentinel lymph node biopsy. 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 She is being followed with observation only by Dr. Lindi Adie.  Screening mammograms were performed recently at the [facility] [place]. They brought her back for second look ultrasound for a palpable mass in the upper outer quadrant of the right breast. Dr. Radford Pax read this as benign fat necrosis. The patient and her husband are quite concerned because of the new firm lump which is a dominant mass and her history of breast cancer. There is no history of trauma or bruising to this area. This is a new finding.    We have decided to go ahead and conservatively excised this. We did talk about the options of observation in 6 month follow-up, referral back to radiology for image guided biopsy, or simple excision. We choose simple excision. I discussed the indications, details, techniques, and numerous risk of lumpectomy with the patient and her husband. She is aware of the  risk of bleeding, infection, cosmetic deformity, nerve damage with chronic pain or numbness. Further surgery if this turns out to be cancer, and other unforeseen problems. She understands all of these  issues. All of her questions were answered. She agrees with this plan.  Operative Findings:       There was a firm, somewhat irregular dominant mass in the upper outer quadrant of the right breast.  Less than 2 cm in size.  There was a separate area medial to this that felt quite firm and we excised all of this area with a conservative margin.  The specimen was marked for margin assessment.  Procedure in Detail:          Following the induction of general LMA anesthesia the patient's right breast was prepped and draped in a sterile fashion.  Surgical timeout was performed.  Intravenous antibiotic given.  0.5% Marcaine with epinephrine was used as local infiltration anesthesia.  A curvilinear circumareolar incision was made overlying the palpable mass.  I took a conservative a left sub-skin since it felt fairly superficial dissection was carried down into the breast tissue and around the mass with findings as described above.  The specimen was removed  and marked with silk sutures and a 6 color ink kit to orient the pathologist.  Hemostasis was excellent.  The breast tissues were closed with interrupteds of 3-0 Vicryl sutures and skin closed with a running subcuticular 4-0 Monocryl and Dermabond.  Breast binder was placed and the patient taken to PACU in stable condition.  EBL 10 mL.  Counts correct.  Complications none.     Edsel Petrin. Dalbert Batman, M.D., FACS General and Minimally Invasive Surgery Breast and Colorectal Surgery  01/29/2015 2:30 PM

## 2015-01-29 NOTE — Interval H&P Note (Signed)
History and Physical Interval Note:  01/29/2015 12:53 PM  Angela Galvan  has presented today for surgery, with the diagnosis of right breast mass  The various methods of treatment have been discussed with the patient and family. After consideration of risks, benefits and other options for treatment, the patient has consented to  Procedure(s): RIGHT BREAST LUMPECTOMY (Right) as a surgical intervention .  The patient's history has been reviewed, patient examined, no change in status, stable for surgery.  I have reviewed the patient's chart and labs.  Questions were answered to the patient's satisfaction.     Adin Hector

## 2015-02-02 ENCOUNTER — Encounter (HOSPITAL_BASED_OUTPATIENT_CLINIC_OR_DEPARTMENT_OTHER): Payer: Self-pay | Admitting: General Surgery

## 2015-02-03 NOTE — Progress Notes (Signed)
Quick Note:  Inform patient of Pathology report,. No cancer in breast. Benign fat necrosis, Good news!  hmi ______

## 2015-02-04 ENCOUNTER — Ambulatory Visit (HOSPITAL_COMMUNITY)
Admission: RE | Admit: 2015-02-04 | Discharge: 2015-02-04 | Disposition: A | Discharge status: Home / Self Care | Payer: Medicare Other | Source: Ambulatory Visit | Attending: Orthopedic Surgery | Admitting: Orthopedic Surgery

## 2015-02-04 DIAGNOSIS — R937 Abnormal findings on diagnostic imaging of other parts of musculoskeletal system: Secondary | ICD-10-CM | POA: Diagnosis not present

## 2015-02-04 DIAGNOSIS — M25552 Pain in left hip: Secondary | ICD-10-CM | POA: Diagnosis not present

## 2015-02-04 DIAGNOSIS — Z853 Personal history of malignant neoplasm of breast: Secondary | ICD-10-CM | POA: Diagnosis not present

## 2015-02-04 MED ORDER — GADOBENATE DIMEGLUMINE 529 MG/ML IV SOLN
10.0000 mL | Freq: Once | INTRAVENOUS | Status: AC | PRN
  Administered 2015-02-04: 10 mL via INTRAVENOUS

## 2015-02-15 DIAGNOSIS — Z90710 Acquired absence of both cervix and uterus: Secondary | ICD-10-CM | POA: Diagnosis not present

## 2015-02-15 DIAGNOSIS — M85052 Fibrous dysplasia (monostotic), left thigh: Secondary | ICD-10-CM | POA: Diagnosis not present

## 2015-02-15 DIAGNOSIS — Z9049 Acquired absence of other specified parts of digestive tract: Secondary | ICD-10-CM | POA: Diagnosis not present

## 2015-03-08 ENCOUNTER — Telehealth: Payer: Self-pay

## 2015-03-08 MED ORDER — RIFAXIMIN 550 MG PO TABS: 550.0000 mg | ORAL_TABLET | Freq: Three times a day (TID) | ORAL | Status: DC

## 2015-03-08 NOTE — Telephone Encounter (Signed)
Let's try Xifaxan. Dispense (42)  550 mg  tablets-one 3 times a day 2 weeks. No refills. Office visit in 4-6 weeks

## 2015-03-08 NOTE — Telephone Encounter (Signed)
rx sent to the pharmacy. Pt is aware.  Stacey, please schedule ov in 4-6 weeks. Pt is requesting ov with RMR.

## 2015-03-08 NOTE — Telephone Encounter (Signed)
Pt called- she has IBS and has been having episodes of diarrhea for the last 3 weeks. She said she will go 2-3 days with no diarrhea then will have 5 days of diarrhea 3-4 times a day. No recent abx, no sick contacts. She is taking imodium and its not helping.   She wants to know if she should takes xifaxan again?

## 2015-03-09 ENCOUNTER — Encounter: Payer: Self-pay | Admitting: Internal Medicine

## 2015-03-09 NOTE — Telephone Encounter (Signed)
APPT MADE AND LETTER SENT  °

## 2015-03-17 DIAGNOSIS — Z23 Encounter for immunization: Secondary | ICD-10-CM | POA: Diagnosis not present

## 2015-04-13 DIAGNOSIS — H903 Sensorineural hearing loss, bilateral: Secondary | ICD-10-CM | POA: Diagnosis not present

## 2015-04-16 ENCOUNTER — Encounter: Payer: Self-pay | Admitting: Internal Medicine

## 2015-04-16 ENCOUNTER — Ambulatory Visit (INDEPENDENT_AMBULATORY_CARE_PROVIDER_SITE_OTHER): Payer: Medicare Other | Admitting: Internal Medicine

## 2015-04-16 VITALS — BP 155/68 | HR 69 | Temp 98.1°F | Ht 60.0 in | Wt 114.0 lb

## 2015-04-16 DIAGNOSIS — K219 Gastro-esophageal reflux disease without esophagitis: Secondary | ICD-10-CM | POA: Diagnosis not present

## 2015-04-16 DIAGNOSIS — K589 Irritable bowel syndrome without diarrhea: Secondary | ICD-10-CM | POA: Diagnosis not present

## 2015-04-16 NOTE — Patient Instructions (Signed)
Continue Aciphex 20 mg daily  Continue Align daily  Office visit in 1 year

## 2015-04-16 NOTE — Progress Notes (Signed)
Primary Care Physician:  Asencion Noble, MD Primary Gastroenterologist:  Dr. Gala Romney  Pre-Procedure History & Physical: HPI:  Angela Galvan is a 81 y.o. female here for GERD and IBS-D. She's doing extremely well from a GI standpoint. AcipHex 20 mg daily continues to control her GERD symptoms Sheell. No dysphagia, early satiety, nausea or vomiting. She developed recurrent diarrhea consistent with IBS D recently.  She took another course of Xifaxan with resolution of her symptoms; she has 1-2 formed bowel movements daily. If her bowel regimen includes only aligned once daily. She is very happy bowel function. She sought orthopedic specialist regarding the lesions on her left pelvis of. She describes having an MRI. All benign. She will followup with her in one year. Her left-sided left flank pain has also resolved.  Past Medical History  Diagnosis Date  . Diverticulitis     s/p colectomy in 1991  . IBS (irritable bowel syndrome)   . Hyperlipidemia   . S/P colonoscopy 2003    minimal internal hemorrhoids, pancolonic diverticula, biopsy of rectum: lymphoplasmacytic microscopic colitis, colon biopsies normal  . S/P colonoscopy 2012    pancolonic diverticula, nl TI, external hemorrhoidal tag,/rectum bx no abnormalities  . S/P endoscopy 2012    normal esophagus, stenotic pyloric channel, TTS dilation  . Diarrhea   . GERD (gastroesophageal reflux disease)   . Arthritis   . Hypothyroidism   . Diabetes mellitus     no meds.-diet controlled  . HOH (hard of hearing)   . Anxiety   . HTN (hypertension)     Does not see a cardiologist  . Stroke ([place]) 08/10/2006    no residual  . 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 Depression   . Cancer [facility])     breast cancer left with mastectomy    Past Surgical History  Procedure Laterality Date  . Colectomy    . Cholecystectomy    . Right ovarian tumor removed as  teenager    . Knee surgery Bilateral     2004- [place]- total; knee  . Bilateral cataracts    . Esophagogastroduodenoscopy  10/11/2010    Normal esophagus, small hiatal hernia,Stenotic pyloric channel likely critical.  Likely contributing to some of the patient's symptoms, status post TTS balloon dilation Normal D1-D3 status post biopsy.  D2-D3 atrophic-appearing gastric mucosa status post biopsy  . Ileocolonoscopy  10/11/2010    external hemorrhoidal tag.  Normal rectum status post biopsy Pancolonic diverticula status post segmental biopsy.  Normal  terminal ileum  . Colonoscopy  09/11/01    Minimal internal hemorrhoids, otherwise normal rectum . Scattered pancolonic diverticula (few) The colonic mucosa otherwise appeared normal status post biopsies and stool sampling as described above.  . Total knee arthroplasty  09/25/2011    Procedure: TOTAL KNEE ARTHROPLASTY;  Surgeon: Gearlean Alf, MD;  Location: [facility];  Service: Orthopedics;  Laterality: Left;  . Breast biopsy Left 01/10/2013    Procedure: BREAST BIOPSY;  Surgeon: Jamesetta So, MD;  Location: [facility];  Service: General;  Laterality: Left;  . Knee arthroplasty Right 2004  . Abdominal hysterectomy  1983  . Simple mastectomy with axillary sentinel node biopsy Left 03/07/2013    Procedure: TOTAL  MASTECTOMY WITH AXILLARY SENTINEL NODE BIOPSY;  Surgeon: Adin Hector, MD;  Location: [place];  Service: General;  Laterality: Left;  . Joint replacement Bilateral   .  Breast lumpectomy Right 01/29/2015    Procedure: RIGHT BREAST LUMPECTOMY;  Surgeon: Fanny Skates, MD;  Location: [place];  Service: General;  Laterality: Right;    Prior to Admission medications   Medication Sig Start Date End Date Taking? Authorizing Provider  acetaminophen (TYLENOL) 500 MG tablet Take 500-1,000 mg by mouth every 6 (six) hours as needed. Pain    Yes Historical Provider, MD  ALPRAZolam (XANAX) 0.5 MG tablet Take 0.5 mg by mouth 2 (two) times  daily as needed for sleep or anxiety.   Yes Historical Provider, MD  aspirin 325 MG tablet Take 325 mg by mouth daily.   Yes Historical Provider, MD  atorvastatin (LIPITOR) 10 MG tablet Take 10 mg by mouth daily after breakfast.   Yes Historical Provider, MD  FLUoxetine (PROZAC) 20 MG capsule Take 20 mg by mouth daily.   Yes Historical Provider, MD  levothyroxine (SYNTHROID, LEVOTHROID) 50 MCG tablet Take 50 mcg by mouth daily before breakfast.    Yes Historical Provider, MD  losartan (COZAAR) 50 MG tablet Take 100 mg by mouth daily.    Yes Historical Provider, MD  Probiotic Product (ALIGN PO) Take by mouth.   Yes Historical Provider, MD  RABEprazole (ACIPHEX) 20 MG tablet Take 1 tablet (20 mg total) by mouth daily. 12/02/14  Yes Orvil Feil, NP  RESTASIS 0.05 % ophthalmic emulsion Place 1 drop into both eyes every 12 (twelve) hours.  01/11/11  Yes Historical Provider, MD  rifaximin (XIFAXAN) 550 MG TABS tablet Take 1 tablet (550 mg total) by mouth 3 (three) times daily. 10/29/14  Yes Mahala Menghini, PA-C  UNABLE TO FIND Rx: [phone] Post Surgical Bras (Quantity: 6) [id number]- Silicone Breast Prosthesis (Quantity: 1) Dx: 174.9; Left Mastectomy 05/01/13  Yes Alphonsa Overall, MD  UNABLE TO FIND Rx: [phone] Mastectomy Form, left (Quantity: 1) Dx: 174.9; Left mastectomy 07/01/13  Yes Fanny Skates, MD  HYDROcodone-acetaminophen (NORCO) 5-325 MG per tablet Take 1-2 tablets by mouth every 6 (six) hours as needed for moderate pain or severe pain. Patient not taking: Reported on 04/16/2015 01/29/15   Fanny Skates, MD  HYDROcodone-acetaminophen (NORCO/VICODIN) 5-325 MG per tablet Take 1 tablet by mouth every 6 (six) hours as needed for moderate pain. Patient not taking: Reported on 04/16/2015 10/29/14   Mahala Menghini, PA-C  NON FORMULARY Hydrocodone      Not sure strength    Has only taken a couple since July for herniated disc    Historical Provider, MD  rifaximin (XIFAXAN) 550 MG TABS tablet Take 1 tablet (550 mg total)  by mouth 3 (three) times daily. For 2 weeks Patient not taking: Reported on 04/16/2015 03/08/15   Daneil Dolin, MD    Allergies as of 04/16/2015  . (No Known Allergies)    Family History  Problem Relation Age of Onset  . Colon cancer Neg Hx   . Stroke Maternal Grandmother     deceased after 3 strokes  . Stroke Mother     deceased after 3 strokes September 24, 1999  . Stroke Sister     twin sister  . Diabetes Father     Social History   Social History  . Marital Status: Married    Spouse Name: N/A  . Number of Children: N/A  . Years of Education: N/A   Occupational History  . retired    Social History Main Topics  . Smoking status: Never Smoker   . Smokeless tobacco: Never Used     Comment:  Never smoked  . Alcohol Use: Yes     Comment: occassional wine  . Drug Use: No  . Sexual Activity: No   Other Topics Concern  . Not on file   Social History Narrative    Review of Systems: See HPI, otherwise negative ROS  Physical Exam: BP 155/68 mmHg  Pulse 69  Temp(Src) 98.1 F (36.7 C) (Oral)  Ht 5' (1.524 m)  Wt 114 lb (51.71 kg)  BMI 22.26 kg/m2 General:   Alert,  pleasant and cooperative in NAD Skin:  Intact without significant lesions or rashes. Eyes:  Sclera clear, no icterus.   Conjunctiva pink. Ears:  Normal auditory acuity. Nose:  No deformity, discharge,  or lesions. Mouth:  No deformity or lesions. Neck:  Supple; no masses or thyromegaly. No significant cervical adenopathy. Lungs:  Clear throughout to auscultation.   No wheezes, crackles, or rhonchi. No acute distress. Heart:  Regular rate and rhythm; no murmurs, clicks, rubs,  or gallops. Abdomen: Non-distended, normal bowel sounds.  Soft and nontender without appreciable mass or hepatosplenomegaly.  Pulses:  Normal pulses noted. Extremities:  Without clubbing or edema.  Impression:  Pleasant 81 year old lady with well controlled GERD. We discussed the risk benefits of long-term acid suppression therapy. In  her case, the benefits far outweigh any potential downside. IBS D - back in remission after a course of Xifaxan. Discussed with her the likelihood of a flare in the future. She may need a round of Xifaxan on occasion. This seems to give her a significant symptom free interval between dosing.  Recommendations:  Continue Aciphex 20 mg daily  Continue Align daily  Office visit in 1 year    Notice: This dictation was prepared with Dragon dictation along with smaller phrase technology. Any transcriptional errors that result from this process are unintentional and may not be corrected upon review.

## 2015-04-20 NOTE — Assessment & Plan Note (Signed)
Left breast high-grade DCIS ER/PR negative status post mastectomy with sentinel lymph node 03/07/2013 grade 3 ER 0%, PR 0%, 1 SLN negative, DCIS size 3.5 cm margins negative. Currently on observation  Surveillance:  1. Mammogram 12/15/2014 followed by ultrasound for multiple circumscribed lucent masses most consistent with benign oil cysts/fat necrosis ( 1.5 cm lump at 10:00 of right breast) 2. Breast exam 04/20/2015 3. 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 There is severe cortical thinning of the posterior subtrochanteric region ( 02/04/2015)  Survivorship:Discussed the importance of physical exercise in decreasing the likelihood of breast cancer recurrence. Recommended 30 mins daily 6 days a week of either brisk walking or cycling or swimming. Encouraged patient to eat more fruits and vegetables and decrease red meat.   Return to clinic in 1 year for follow-up

## 2015-04-21 ENCOUNTER — Telehealth: Payer: Self-pay | Admitting: Hematology and Oncology

## 2015-04-21 ENCOUNTER — Ambulatory Visit: Payer: Medicare Other | Admitting: Hematology and Oncology

## 2015-04-21 NOTE — Telephone Encounter (Signed)
Patient had called in and left a message to cancel her appointment as she was not feeling well,called and she has been rescheduled

## 2015-04-23 DIAGNOSIS — E119 Type 2 diabetes mellitus without complications: Secondary | ICD-10-CM | POA: Diagnosis not present

## 2015-04-29 ENCOUNTER — Encounter: Payer: Self-pay | Admitting: Hematology and Oncology

## 2015-04-29 ENCOUNTER — Ambulatory Visit (HOSPITAL_BASED_OUTPATIENT_CLINIC_OR_DEPARTMENT_OTHER): Payer: Medicare Other | Admitting: Hematology and Oncology

## 2015-04-29 VITALS — BP 162/56 | HR 64 | Temp 97.5°F | Resp 18 | Ht 60.0 in | Wt 112.6 lb

## 2015-04-29 DIAGNOSIS — E119 Type 2 diabetes mellitus without complications: Secondary | ICD-10-CM | POA: Diagnosis not present

## 2015-04-29 DIAGNOSIS — Z6821 Body mass index (BMI) 21.0-21.9, adult: Secondary | ICD-10-CM | POA: Diagnosis not present

## 2015-04-29 DIAGNOSIS — D0512 Intraductal carcinoma in situ of left breast: Secondary | ICD-10-CM

## 2015-04-29 DIAGNOSIS — I10 Essential (primary) hypertension: Secondary | ICD-10-CM | POA: Diagnosis not present

## 2015-04-29 NOTE — Progress Notes (Signed)
Patient Care Team: Asencion Noble, MD as PCP - General (Internal Medicine) Daneil Dolin, MD (Gastroenterology)  DIAGNOSIS: high-grade DCIS status post mastectomy with sentinel lymph node biopsy on 03/07/2013 Grade 3, ER/PR negative hence did not require antiestrogen therapy. Lumpectomy 01/29/2015: Fat necrosis  CHIEF COMPLIANT: followup of DCIS  INTERVAL HISTORY: Angela Galvan is a 81 year old with above-mentioned history of high-grade DCIS involving the left breast is here for a routine follow-up. She reports no problems or concerns. She had a right lumpectomy in September 2016 for a palpable nodule. Final pathology came back as fat necrosis. She is healing very well from the surgery  REVIEW OF SYSTEMS:   Constitutional: Denies fevers, chills or abnormal weight loss Eyes: Denies blurriness of vision Ears, nose, mouth, throat, and face: Denies mucositis or sore throat Respiratory: Denies cough, dyspnea or wheezes Cardiovascular: Denies palpitation, chest discomfort or lower extremity swelling Gastrointestinal:  Denies nausea, heartburn or change in bowel habits Skin: Denies abnormal skin rashes Lymphatics: Denies new lymphadenopathy or easy bruising Neurological:Denies numbness, tingling or new weaknesses Behavioral/Psych: Mood is stable, no new changes  Breast:  denies any pain or lumps or nodules in either breasts All other systems were reviewed with the patient and are negative.  I have reviewed the past medical history, past surgical history, social history and family history with the patient and they are unchanged from previous note.  ALLERGIES:  has No Known Allergies.  MEDICATIONS:  Current Outpatient Prescriptions  Medication Sig Dispense Refill  . acetaminophen (TYLENOL) 500 MG tablet Take 500-1,000 mg by mouth every 6 (six) hours as needed. Pain     . ALPRAZolam (XANAX) 0.5 MG tablet Take 0.5 mg by mouth 2 (two) times daily as needed for sleep or anxiety.    Marland Kitchen aspirin  325 MG tablet Take 325 mg by mouth daily.    Marland Kitchen atorvastatin (LIPITOR) 10 MG tablet Take 10 mg by mouth daily after breakfast.    . FLUoxetine (PROZAC) 20 MG capsule Take 20 mg by mouth daily.    Marland Kitchen levothyroxine (SYNTHROID, LEVOTHROID) 50 MCG tablet Take 50 mcg by mouth daily before breakfast.     . losartan (COZAAR) 50 MG tablet Take 100 mg by mouth daily.     . Probiotic Product (ALIGN PO) Take by mouth.    . RABEprazole (ACIPHEX) 20 MG tablet Take 1 tablet (20 mg total) by mouth daily. 90 tablet 3  . RESTASIS 0.05 % ophthalmic emulsion Place 1 drop into both eyes every 12 (twelve) hours.     Marland Kitchen UNABLE TO FIND Rx: [id number]- Post Surgical Bras (Quantity: 6) [id number]- Silicone Breast Prosthesis (Quantity: 1) Dx: 174.9; Left Mastectomy 1 each 0  . UNABLE TO FIND Rx: [id number] Mastectomy Form, left (Quantity: 1) Dx: 174.9; Left mastectomy 1 each 0   No current facility-administered medications for this visit.    PHYSICAL EXAMINATION: ECOG PERFORMANCE STATUS: 0 - Asymptomatic  Filed Vitals:   04/29/15 1409  BP: 162/56  Pulse: 64  Temp: 97.5 F (36.4 C)  Resp: 18   Filed Weights   04/29/15 1409  Weight: 112 lb 9.6 oz (51.075 kg)    GENERAL:alert, no distress and comfortable SKIN: skin color, texture, turgor are normal, no rashes or significant lesions EYES: normal, Conjunctiva are pink and non-injected, sclera clear OROPHARYNX:no exudate, no erythema and lips, buccal mucosa, and tongue normal  NECK: supple, thyroid normal size, non-tender, without nodularity LYMPH:  no palpable lymphadenopathy in the cervical, axillary or inguinal LUNGS: clear  to auscultation and percussion with normal breathing effort HEART: regular rate & rhythm and no murmurs and no lower extremity edema ABDOMEN:abdomen soft, non-tender and normal bowel sounds Musculoskeletal:no cyanosis of digits and no clubbing  NEURO: alert & oriented x 3 with fluent speech, no focal motor/sensory deficits BREAST: right breast scar  from recent surgery is healing well no palpable lumps or nodules in the right breast and left chest wall or axilla. (exam performed in the presence of a chaperone)  LABORATORY DATA:  I have reviewed the data as listed   Chemistry      Component Value Date/Time   NA 138 01/27/2015 1230   NA 141 04/20/2014 1105   K 4.2 01/27/2015 1230   K 4.9 04/20/2014 1105   CL 106 01/27/2015 1230   CO2 23 01/27/2015 1230   CO2 23 04/20/2014 1105   BUN 19 01/27/2015 1230   BUN 27.1* 04/20/2014 1105   CREATININE 0.85 01/27/2015 1230   CREATININE 1.1 04/20/2014 1105      Component Value Date/Time   CALCIUM 9.3 01/27/2015 1230   CALCIUM 9.5 04/20/2014 1105   ALKPHOS 83 01/27/2015 1230   ALKPHOS 101 04/20/2014 1105   AST 29 01/27/2015 1230   AST 21 04/20/2014 1105   ALT 29 01/27/2015 1230   ALT 19 04/20/2014 1105   BILITOT 0.8 01/27/2015 1230   BILITOT 0.51 04/20/2014 1105       Lab Results  Component Value Date   WBC 7.0 01/27/2015   HGB 12.6 01/27/2015   HCT 37.3 01/27/2015   MCV 91.2 01/27/2015   PLT 334 01/27/2015   NEUTROABS 3.7 01/27/2015   ASSESSMENT & PLAN:  Ductal carcinoma in situ (DCIS) of left breast Left breast high-grade DCIS ER/PR negative status post mastectomy with sentinel lymph node 03/07/2013 grade 3 ER 0%, PR 0%, 1 SLN negative, DCIS size 3.5 cm margins negative. Currently on observation  Breast Cancer Surveillance: 1. Breast exam  04/29/2015: Normal 2. Mammogram right mammogram 12/15/2014 with ultrasound showed fat necrosis in the upper outer quadrant. Postsurgical changes. Breast Density Category B. I recommended that she get 3-D mammograms for surveillance. Discussed the differences between different breast density categories.   return to clinic as needed.   No orders of the defined types were placed in this encounter.   The patient has a good understanding of the overall plan. she agrees with it. she will call with any problems that may develop before the  next visit here.   Rulon Eisenmenger, MD 04/29/2015

## 2015-04-29 NOTE — Assessment & Plan Note (Signed)
Left breast high-grade DCIS ER/PR negative status post mastectomy with sentinel lymph node 03/07/2013 grade 3 ER 0%, PR 0%, 1 SLN negative, DCIS size 3.5 cm margins negative. Currently on observation  Breast Cancer Surveillance: 1. Breast exam  04/29/2015: Normal 2. Mammogram right mammogram 12/15/2014 with ultrasound showed fat necrosis in the upper outer quadrant. Postsurgical changes. Breast Density Category B. I recommended that she get 3-D mammograms for surveillance. Discussed the differences between different breast density categories.    return to clinic as needed. patient will follow with survivorship clinic  For her annual follow-ups

## 2015-04-29 NOTE — Addendum Note (Signed)
Addended by: Prentiss Bells on: 04/29/2015 06:37 PM   Modules accepted: Medications

## 2015-08-23 LAB — HEMOGLOBIN A1C: Hemoglobin A1C: 5.9

## 2015-11-01 ENCOUNTER — Other Ambulatory Visit (HOSPITAL_COMMUNITY): Payer: Self-pay | Admitting: Internal Medicine

## 2015-11-01 DIAGNOSIS — Z1231 Encounter for screening mammogram for malignant neoplasm of breast: Secondary | ICD-10-CM

## 2015-12-16 ENCOUNTER — Ambulatory Visit (HOSPITAL_COMMUNITY)
Admission: RE | Admit: 2015-12-16 | Discharge: 2015-12-16 | Disposition: A | Discharge status: Home / Self Care | Payer: Medicare Other | Source: Ambulatory Visit | Attending: Internal Medicine | Admitting: Internal Medicine

## 2015-12-16 DIAGNOSIS — Z1231 Encounter for screening mammogram for malignant neoplasm of breast: Secondary | ICD-10-CM | POA: Diagnosis not present

## 2015-12-24 LAB — BASIC METABOLIC PANEL
BUN: 18 (ref 4–21)
Creatinine: 1.1 (ref <=1.1)
Glucose: 96
Potassium: 4.8 (ref 3.4–5.3)
Sodium: 141 (ref 137–147)

## 2015-12-24 LAB — LIPID PANEL
Cholesterol: 156 (ref 0–200)
HDL: 66 (ref 35–70)
LDL Cholesterol: 61
Triglycerides: 143 (ref 40–160)

## 2015-12-24 LAB — CBC AND DIFFERENTIAL
HCT: 42 (ref 36–46)
Hemoglobin: 13.9 (ref 12.0–16.0)
Platelets: 417 — AB (ref 150–399)
WBC: 6.8

## 2015-12-24 LAB — HEPATIC FUNCTION PANEL
ALT: 18 (ref 7–35)
AST: 19 (ref 13–35)
Alkaline Phosphatase: 91 (ref 25–125)
Bilirubin, Total: 0.6

## 2015-12-24 LAB — HEMOGLOBIN A1C: Hemoglobin A1C: 5.8

## 2016-01-05 ENCOUNTER — Telehealth: Payer: Self-pay | Admitting: Internal Medicine

## 2016-01-05 MED ORDER — RABEPRAZOLE SODIUM 20 MG PO TBEC: 20.0000 mg | DELAYED_RELEASE_TABLET | Freq: Every day | ORAL | 3 refills | Status: DC

## 2016-01-05 NOTE — Telephone Encounter (Signed)
PATIENT CALLED AND WANTS HER PRESCRIPTION OF ACIPHEX CHANGED FROM [facility] TO [facility] CALL [phone]

## 2016-01-05 NOTE — Telephone Encounter (Signed)
Please notify the patient refill sent to [facility] as requested.

## 2016-01-05 NOTE — Addendum Note (Signed)
Addended by: Gordy Levan, ERIC A on: 01/05/2016 02:38 PM   Modules accepted: Orders

## 2016-01-05 NOTE — Telephone Encounter (Signed)
Routing to the refill box. 

## 2016-01-17 ENCOUNTER — Telehealth: Payer: Self-pay | Admitting: Internal Medicine

## 2016-01-17 MED ORDER — RIFAXIMIN 550 MG PO TABS: 550.0000 mg | ORAL_TABLET | Freq: Three times a day (TID) | ORAL | 0 refills | Status: DC

## 2016-01-17 NOTE — Telephone Encounter (Signed)
She had a really nice long-term response with Xifaxan last year. Let's give her another round. Yes.

## 2016-01-17 NOTE — Telephone Encounter (Signed)
PATIENT NEEDS HER DIARRHEA MEDICINE CALLED INTO HER PHARMACY

## 2016-01-17 NOTE — Addendum Note (Signed)
Addended by: Claudina Lick on: 01/17/2016 05:14 PM   Modules accepted: Orders

## 2016-01-17 NOTE — Telephone Encounter (Signed)
Pt took [place] last year. Can we send this in again for her? Please see your last ov note.

## 2016-01-17 NOTE — Telephone Encounter (Signed)
Pt is aware. rx sent to [facility] per her request.

## 2016-03-07 ENCOUNTER — Encounter: Payer: Self-pay | Admitting: Internal Medicine

## 2016-03-07 ENCOUNTER — Ambulatory Visit (INDEPENDENT_AMBULATORY_CARE_PROVIDER_SITE_OTHER): Payer: Medicare Other | Admitting: Internal Medicine

## 2016-03-07 VITALS — BP 171/80 | HR 71 | Temp 98.0°F | Ht 60.0 in | Wt 106.6 lb

## 2016-03-07 DIAGNOSIS — R197 Diarrhea, unspecified: Secondary | ICD-10-CM | POA: Diagnosis not present

## 2016-03-07 DIAGNOSIS — K219 Gastro-esophageal reflux disease without esophagitis: Secondary | ICD-10-CM | POA: Diagnosis not present

## 2016-03-07 MED ORDER — CILIDINIUM-CHLORDIAZEPOXIDE 2.5-5 MG PO CAPS: 1.0000 | ORAL_CAPSULE | Freq: Three times a day (TID) | ORAL | 1 refills | Status: DC

## 2016-03-07 NOTE — Progress Notes (Signed)
Primary Care Physician:  Asencion Noble, MD Primary Gastroenterologist:  Dr. Gala Romney  Pre-Procedure History & Physical: HPI:  Angela Galvan is a 82 y.o. female here for follow-up diarrhea. Has a diagnosis of IBS D;  Was doing well after a two-week course of Xifaxan last year. Was treated for diverticulitis on 2 different episodes over the past summer with Cipro and Flagyl. No recent imaging. CT last year demonstrated no evidence of any acute inflammatory findings when she presented with left-sided abdominal pain. She states being treated with a first round of Cipro and Flagyl she started having of a hard time with diarrhea multiple watery stools daily. She's lost almost 8 pounds. No blood per rectum. Diarrhea off and also an issue for years. Distant colon biopsies negative for microscopic colitis. Last colonoscopy 2012-only scattered diverticula at that time.  GERD well-controlled on rabeprazole.  Past Medical History:  Diagnosis Date  . Anxiety   . Arthritis   . Cancer [facility])    breast cancer left with mastectomy  . 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 Depression   . Diabetes mellitus    no meds.-diet controlled  . Diarrhea   . Diverticulitis    s/p colectomy in 1991  . GERD (gastroesophageal reflux disease)   . HOH (hard of hearing)   . HTN (hypertension)    Does not see a cardiologist  . Hyperlipidemia   . Hypothyroidism   . IBS (irritable bowel syndrome)   . S/P colonoscopy 2003   minimal internal hemorrhoids, pancolonic diverticula, biopsy of rectum: lymphoplasmacytic microscopic colitis, colon biopsies normal  . S/P colonoscopy 2012   pancolonic diverticula, nl TI, external hemorrhoidal tag,/rectum bx no abnormalities  . S/P endoscopy 2012   normal esophagus, stenotic pyloric channel, TTS dilation  . Stroke ([place]) 08/10/2006   no residual    Past Surgical History:  Procedure Laterality  Date  . ABDOMINAL HYSTERECTOMY  1983  . bilateral cataracts    . BREAST BIOPSY Left 01/10/2013   Procedure: BREAST BIOPSY;  Surgeon: Jamesetta So, MD;  Location: [facility];  Service: General;  Laterality: Left;  . BREAST LUMPECTOMY Right 01/29/2015   Procedure: RIGHT BREAST LUMPECTOMY;  Surgeon: Fanny Skates, MD;  Location: [place];  Service: General;  Laterality: Right;  . CHOLECYSTECTOMY    . COLECTOMY    . COLONOSCOPY  09/11/01   Minimal internal hemorrhoids, otherwise normal rectum . Scattered pancolonic diverticula (few) The colonic mucosa otherwise appeared normal status post biopsies and stool sampling as described above.  . ESOPHAGOGASTRODUODENOSCOPY  10/11/2010   Normal esophagus, small hiatal hernia,Stenotic pyloric channel likely critical.  Likely contributing to some of the patient's symptoms, status post TTS balloon dilation Normal D1-D3 status post biopsy.  D2-D3 atrophic-appearing gastric mucosa status post biopsy  . ileocolonoscopy  10/11/2010   external hemorrhoidal tag.  Normal rectum status post biopsy Pancolonic diverticula status post segmental biopsy.  Normal  terminal ileum  . JOINT REPLACEMENT Bilateral   . KNEE ARTHROPLASTY Right 2004  . KNEE SURGERY Bilateral    2004- [facility]- total; knee  . right ovarian tumor removed as teenager    . SIMPLE MASTECTOMY WITH AXILLARY SENTINEL NODE BIOPSY Left 03/07/2013   Procedure: TOTAL  MASTECTOMY WITH AXILLARY SENTINEL NODE BIOPSY;  Surgeon: Adin Hector, MD;  Location: [place];  Service: General;  Laterality: Left;  . TOTAL KNEE ARTHROPLASTY  09/25/2011   Procedure: TOTAL KNEE ARTHROPLASTY;  Surgeon: Gearlean Alf, MD;  Location: [facility];  Service: Orthopedics;  Laterality: Left;    Prior to Admission medications   Medication Sig Start Date End Date Taking? Authorizing Provider  acetaminophen (TYLENOL) 500 MG tablet Take 500-1,000 mg by mouth every 6 (six) hours as needed. Pain    Yes Historical Provider, MD    ALPRAZolam (XANAX) 0.5 MG tablet Take 0.5 mg by mouth 2 (two) times daily as needed for sleep or anxiety.   Yes Historical Provider, MD  aspirin 325 MG tablet Take 325 mg by mouth daily.   Yes Historical Provider, MD  atorvastatin (LIPITOR) 10 MG tablet Take 10 mg by mouth daily after breakfast.   Yes Historical Provider, MD  FLUoxetine (PROZAC) 20 MG capsule Take 20 mg by mouth daily.   Yes Historical Provider, MD  levothyroxine (SYNTHROID, LEVOTHROID) 50 MCG tablet Take 50 mcg by mouth daily before breakfast.    Yes Historical Provider, MD  loperamide (IMODIUM) 2 MG capsule Take by mouth as needed for diarrhea or loose stools.   Yes Historical Provider, MD  losartan (COZAAR) 50 MG tablet Take 100 mg by mouth daily.    Yes Historical Provider, MD  Probiotic Product (ALIGN PO) Take by mouth.   Yes Historical Provider, MD  Probiotic Product (ALIGN PO) Take by mouth.   Yes Historical Provider, MD  RABEprazole (ACIPHEX) 20 MG tablet Take 1 tablet (20 mg total) by mouth daily. 01/05/16  Yes Carlis Stable, NP  RESTASIS 0.05 % ophthalmic emulsion Place 1 drop into both eyes every 12 (twelve) hours.  01/11/11  Yes Historical Provider, MD  UNABLE TO FIND Rx: [place] (Quantity: 6) [id number]- Silicone Breast Prosthesis (Quantity: 1) Dx: 174.9; Left Mastectomy 05/01/13  Yes Alphonsa Overall, MD  UNABLE TO FIND Rx: [phone]- Mastectomy Form, left (Quantity: 1) Dx: 174.9; Left mastectomy 07/01/13  Yes Fanny Skates, MD  loperamide (IMODIUM) 2 MG capsule Take 2 mg by mouth as needed for diarrhea or loose stools.    Historical Provider, MD  rifaximin (XIFAXAN) 550 MG TABS tablet Take 1 tablet (550 mg total) by mouth 3 (three) times daily. For 14 days. Patient not taking: Reported on 03/07/2016 01/17/16   Daneil Dolin, MD  topiramate (TOPAMAX) 25 MG tablet Take 25 mg by mouth daily.    Historical Provider, MD    Allergies as of 03/07/2016  . (No Known Allergies)    Family History  Problem Relation Age  of Onset  . Stroke Mother     deceased after 3 strokes 1999-09-12  . Diabetes Father   . Stroke Maternal Grandmother     deceased after 3 strokes  . Stroke Sister     twin sister  . Colon cancer Neg Hx     Social History   Social History  . Marital status: Married    Spouse name: N/A  . Number of children: N/A  . Years of education: N/A   Occupational History  . retired Retired   Social History Main Topics  . Smoking status: Never Smoker  . Smokeless tobacco: Never Used     Comment: Never smoked  . Alcohol use Yes     Comment: occassional wine  . Drug use: No  . Sexual activity: No   Other Topics Concern  . Not on file   Social History Narrative  . No narrative on file    Review of Systems: See HPI, otherwise  negative ROS  Physical Exam: BP (!) 171/80   Pulse 71   Temp 98 F (36.7 C) (Oral)   Ht 5' (1.524 m)   Wt 106 lb 9.6 oz (48.4 kg)   BMI 20.82 kg/m  General:   Alert,  Well-developed, well-nourished, pleasant and cooperative in NAD Skin:  Intact without significant lesions or rashes. Eyes:  Sclera clear, no icterus.   Conjunctiva pink. Neck:  Supple; no masses or thyromegaly. No significant cervical adenopathy. Lungs:  Clear throughout to auscultation.   No wheezes, crackles, or rhonchi. No acute distress. Heart:  Regular rate and rhythm; no murmurs, clicks, rubs,  or gallops. Abdomen: nondistended. Positive bowel sounds. Soft with some minimal left lower quadrant to palpation. No appreciable mass or megaly. Pulses:  Normal pulses noted. Extremities:  Without clubbing or edema.  Impression:  Pleasant 82 year old  with intermittent diarrhea for years. Baseline IBS-D. Worsening of diarrhea since 2 rounds of antibiotics earlier this year for presumed diverticulitis. She's lost 7 pounds.  May be simply having an exacerbation of IBS (postinfectious IBS). Ned to consider the possibility of C. Difficile infection. Although biopsies were negative previously,  microscopic colitis remains in the differential. Patient wants something to "calm her colon Down".  Recommendations: Stool sample for GI pathogen panel and Clostridium difficile  If stool studies are negative,  she may need a colonoscopy to further evaluate diarrhea.  Trial of librax - one tablet before meals and at bedtime as needed (disp#40 with one refill)  Further recommendations to follow     Notice: This dictation was prepared with Dragon dictation along with smaller phrase technology. Any transcriptional errors that result from this process are unintentional and may not be corrected upon review.

## 2016-03-07 NOTE — Patient Instructions (Signed)
Stool sample for GI pathogen panel and Clostridium difficile  If your stool studies are negative,  you may need a colonoscopy to further evaluate your diarrhea.  Trial of librax - one tablet before meals and at bedtime as needed (disp#40 with one refill)  Further recommendations to follow

## 2016-03-13 ENCOUNTER — Other Ambulatory Visit: Payer: Self-pay | Admitting: Internal Medicine

## 2016-03-14 LAB — CLOSTRIDIUM DIFFICILE BY PCR: Toxigenic C. Difficile by PCR: NOT DETECTED

## 2016-03-15 LAB — GASTROINTESTINAL PATHOGEN PANEL PCR
C. difficile Tox A/B, PCR: NOT DETECTED
CRYPTOSPORIDIUM, PCR: NOT DETECTED
Campylobacter, PCR: NOT DETECTED
E COLI (STEC) STX1/STX2, PCR: NOT DETECTED
E coli (ETEC) LT/ST PCR: NOT DETECTED
E coli 0157, PCR: NOT DETECTED
GIARDIA LAMBLIA, PCR: NOT DETECTED
Norovirus, PCR: NOT DETECTED
ROTAVIRUS, PCR: NOT DETECTED
Salmonella, PCR: NOT DETECTED
Shigella, PCR: NOT DETECTED

## 2016-03-20 ENCOUNTER — Other Ambulatory Visit: Payer: Self-pay

## 2016-03-20 DIAGNOSIS — K52838 Other microscopic colitis: Secondary | ICD-10-CM

## 2016-03-20 MED ORDER — SOD PICOSULFATE-MAG OX-CIT ACD 10-3.5-12 MG-GM-GM PO PACK: 1.0000 | PACK | ORAL | 0 refills | Status: DC

## 2016-03-21 NOTE — Addendum Note (Signed)
Addended by: Annitta Needs on: 03/21/2016 01:41 PM   Modules accepted: Orders

## 2016-03-23 ENCOUNTER — Encounter (HOSPITAL_COMMUNITY)
Admission: RE | Disposition: A | Discharge status: Home / Self Care | Payer: Self-pay | Source: Ambulatory Visit | Attending: Internal Medicine

## 2016-03-23 ENCOUNTER — Ambulatory Visit (HOSPITAL_COMMUNITY)
Admission: RE | Admit: 2016-03-23 | Discharge: 2016-03-23 | Disposition: A | Discharge status: Home / Self Care | Payer: Medicare Other | Source: Ambulatory Visit | Attending: Internal Medicine | Admitting: Internal Medicine

## 2016-03-23 DIAGNOSIS — K219 Gastro-esophageal reflux disease without esophagitis: Secondary | ICD-10-CM | POA: Insufficient documentation

## 2016-03-23 DIAGNOSIS — K5730 Diverticulosis of large intestine without perforation or abscess without bleeding: Secondary | ICD-10-CM | POA: Diagnosis not present

## 2016-03-23 DIAGNOSIS — R197 Diarrhea, unspecified: Secondary | ICD-10-CM | POA: Diagnosis present

## 2016-03-23 DIAGNOSIS — I10 Essential (primary) hypertension: Secondary | ICD-10-CM | POA: Diagnosis not present

## 2016-03-23 DIAGNOSIS — Z9012 Acquired absence of left breast and nipple: Secondary | ICD-10-CM | POA: Diagnosis not present

## 2016-03-23 DIAGNOSIS — F419 Anxiety disorder, unspecified: Secondary | ICD-10-CM | POA: Diagnosis not present

## 2016-03-23 DIAGNOSIS — Z96652 Presence of left artificial knee joint: Secondary | ICD-10-CM | POA: Insufficient documentation

## 2016-03-23 DIAGNOSIS — E785 Hyperlipidemia, unspecified: Secondary | ICD-10-CM | POA: Diagnosis not present

## 2016-03-23 DIAGNOSIS — Z853 Personal history of malignant neoplasm of breast: Secondary | ICD-10-CM | POA: Insufficient documentation

## 2016-03-23 DIAGNOSIS — Z9049 Acquired absence of other specified parts of digestive tract: Secondary | ICD-10-CM | POA: Diagnosis not present

## 2016-03-23 DIAGNOSIS — E119 Type 2 diabetes mellitus without complications: Secondary | ICD-10-CM | POA: Diagnosis not present

## 2016-03-23 DIAGNOSIS — Z7982 Long term (current) use of aspirin: Secondary | ICD-10-CM | POA: Diagnosis not present

## 2016-03-23 DIAGNOSIS — K52838 Other microscopic colitis: Secondary | ICD-10-CM

## 2016-03-23 DIAGNOSIS — Z8673 Personal history of transient ischemic attack (TIA), and cerebral infarction without residual deficits: Secondary | ICD-10-CM | POA: Insufficient documentation

## 2016-03-23 DIAGNOSIS — E039 Hypothyroidism, unspecified: Secondary | ICD-10-CM | POA: Insufficient documentation

## 2016-03-23 DIAGNOSIS — K529 Noninfective gastroenteritis and colitis, unspecified: Secondary | ICD-10-CM | POA: Insufficient documentation

## 2016-03-23 HISTORY — PX: COLONOSCOPY: SHX5424

## 2016-03-23 HISTORY — PX: BIOPSY: SHX5522

## 2016-03-23 SURGERY — COLONOSCOPY
Anesthesia: Moderate Sedation

## 2016-03-23 MED ORDER — MIDAZOLAM HCL 5 MG/5ML IJ SOLN
INTRAMUSCULAR | Status: DC | PRN
  Administered 2016-03-23: 1 mg via INTRAVENOUS
  Administered 2016-03-23: 2 mg via INTRAVENOUS

## 2016-03-23 MED ORDER — STERILE WATER FOR IRRIGATION IR SOLN
Status: DC | PRN
  Administered 2016-03-23: 12:00:00

## 2016-03-23 MED ORDER — SODIUM CHLORIDE 0.9 % IV SOLN
INTRAVENOUS | Status: DC
  Administered 2016-03-23: 1000 mL via INTRAVENOUS

## 2016-03-23 MED ORDER — ONDANSETRON HCL 4 MG/2ML IJ SOLN
INTRAMUSCULAR | Status: DC | PRN
  Administered 2016-03-23: 4 mg via INTRAVENOUS

## 2016-03-23 MED ORDER — MEPERIDINE HCL 100 MG/ML IJ SOLN
INTRAMUSCULAR | Status: DC | PRN
  Administered 2016-03-23: 50 mg via INTRAVENOUS
  Administered 2016-03-23: 25 mg via INTRAVENOUS

## 2016-03-23 MED ORDER — MEPERIDINE HCL 100 MG/ML IJ SOLN
INTRAMUSCULAR | Status: AC
  Filled 2016-03-23: qty 2

## 2016-03-23 MED ORDER — MIDAZOLAM HCL 5 MG/5ML IJ SOLN
INTRAMUSCULAR | Status: AC
  Filled 2016-03-23: qty 10

## 2016-03-23 MED ORDER — ONDANSETRON HCL 4 MG/2ML IJ SOLN
INTRAMUSCULAR | Status: AC
  Filled 2016-03-23: qty 2

## 2016-03-23 NOTE — Op Note (Signed)
[facility] Patient Name: Angela Galvan Procedure Date: 03/23/2016 11:34 AM MRN: [id number] Date of Birth: 06/03/1932 Attending MD: Norvel Richards , MD CSN: [id number] Age: 82 Admit Type: Outpatient Procedure:                Ileo-colonoscopy with segmental biopsy Indications:              Chronic diarrhea; negative stool studies Providers:                Norvel Richards, MD, Otis Peak B. Gwenlyn Perking RN, RN,                            Isabella Stalling, Technician Referring MD:              Medicines:                Midazolam 3 mg IV, Meperidine 75 mg IV, Ondansetron                            4 mg IV Complications:            No immediate complications. Estimated Blood Loss:     Estimated blood loss: none. Procedure:                Pre-Anesthesia Assessment:                           - Prior to the procedure, a History and Physical                            was performed, and patient medications and                            allergies were reviewed. The patient's tolerance of                            previous anesthesia was also reviewed. The risks                            and benefits of the procedure and the sedation                            options and risks were discussed with the patient.                            All questions were answered, and informed consent                            was obtained. Prior Anticoagulants: The patient has                            taken no previous anticoagulant or antiplatelet                            agents. ASA Grade Assessment: II - A patient with  mild systemic disease. After reviewing the risks                            and benefits, the patient was deemed in                            satisfactory condition to undergo the procedure.                           After obtaining informed consent, the colonoscope                            was passed under direct vision. Throughout the              procedure, the patient's blood pressure, pulse, and                            oxygen saturations were monitored continuously. The                            [id number]) was introduced through the                            anus and advanced to the 5 cm into the ileum. The                            terminal ileum, ileocecal valve, appendiceal                            orifice, and rectum were photographed. The entire                            colon was well visualized. The quality of the bowel                            preparation was adequate. The terminal ileum,                            ileocecal valve, appendiceal orifice, and rectum                            were photographed. Scope In: [id number] AM Scope Out: 12:01:48 PM Scope Withdrawal Time: 0 hours 9 minutes 19 seconds  Total Procedure Duration: 0 hours 15 minutes 0 seconds  Findings:      The perianal and digital rectal examinations were normal.      A few small-mouthed diverticula were found in the left colon. Site of       prior anastomosis site?"identified in left colon. The remainder of the       rectal and colonic mucosa appeared normal. The distal 5 cm of terminal       ileum mucosa also appeared normal.      The exam was otherwise without abnormality on direct and retroflexion       views. Segmental biopsies of the right and left colon taken for  histologic study. Impression:               - Diverticulosis in the left colon.                           - The examination was otherwise normal on direct                            and retroflexion views.                           - No specimens collected. Moderate Sedation:      Moderate (conscious) sedation was administered by the endoscopy nurse       and supervised by the endoscopist. The following parameters were       monitored: oxygen saturation, heart rate, blood pressure, respiratory       rate, EKG, adequacy of pulmonary ventilation,  and response to care.       Total physician intraservice time was 19 minutes. Recommendation:           - Patient has a contact number available for                            emergencies. The signs and symptoms of potential                            delayed complications were discussed with the                            patient. Return to normal activities tomorrow.                            Written discharge instructions were provided to the                            patient.                           - Resume previous diet.                           - Continue present medications.                           - No repeat colonoscopy due to age.                           - Return to GI office in 6 weeks. Procedure Code(s):        --- Professional ---                           [phone], Colonoscopy, flexible; diagnostic, including                            collection of specimen(s) by brushing or washing,  when performed (separate procedure)                           99152, Moderate sedation services provided by the                            same physician or other qualified health care                            professional performing the diagnostic or                            therapeutic service that the sedation supports,                            requiring the presence of an independent trained                            observer to assist in the monitoring of the                            patient's level of consciousness and physiological                            status; initial 15 minutes of intraservice time,                            patient age 101 years or older Diagnosis Code(s):        --- Professional ---                           K52.9, Noninfective gastroenteritis and colitis,                            unspecified                           K57.30, Diverticulosis of large intestine without                            perforation or abscess  without bleeding CPT copyright 2016 American Medical Association. All rights reserved. The codes documented in this report are preliminary and upon coder review may  be revised to meet current compliance requirements. Cristopher Estimable. Nameer Summer, MD Norvel Richards, MD 03/23/2016 12:09:10 PM This report has been signed electronically. Number of Addenda: 0

## 2016-03-23 NOTE — H&P (View-Only) (Signed)
Primary Care Physician:  Asencion Noble, MD Primary Gastroenterologist:  Dr. Gala Romney  Pre-Procedure History & Physical: HPI:  Angela Galvan is a 82 y.o. female here for follow-up diarrhea. Has a diagnosis of IBS D;  Was doing well after a two-week course of Xifaxan last year. Was treated for diverticulitis on 2 different episodes over the past summer with Cipro and Flagyl. No recent imaging. CT last year demonstrated no evidence of any acute inflammatory findings when she presented with left-sided abdominal pain. She states being treated with a first round of Cipro and Flagyl she started having of a hard time with diarrhea multiple watery stools daily. She's lost almost 8 pounds. No blood per rectum. Diarrhea off and also an issue for years. Distant colon biopsies negative for microscopic colitis. Last colonoscopy 2012-only scattered diverticula at that time.  GERD well-controlled on rabeprazole.  Past Medical History:  Diagnosis Date  . Anxiety   . Arthritis   . Cancer [facility])    breast cancer left with mastectomy  . 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 Depression   . Diabetes mellitus    no meds.-diet controlled  . Diarrhea   . Diverticulitis    s/p colectomy in 1991  . GERD (gastroesophageal reflux disease)   . HOH (hard of hearing)   . HTN (hypertension)    Does not see a cardiologist  . Hyperlipidemia   . Hypothyroidism   . IBS (irritable bowel syndrome)   . S/P colonoscopy 2003   minimal internal hemorrhoids, pancolonic diverticula, biopsy of rectum: lymphoplasmacytic microscopic colitis, colon biopsies normal  . S/P colonoscopy 2012   pancolonic diverticula, nl TI, external hemorrhoidal tag,/rectum bx no abnormalities  . S/P endoscopy 2012   normal esophagus, stenotic pyloric channel, TTS dilation  . Stroke ([place]) 08/10/2006   no residual    Past Surgical History:  Procedure Laterality  Date  . ABDOMINAL HYSTERECTOMY  1983  . bilateral cataracts    . BREAST BIOPSY Left 01/10/2013   Procedure: BREAST BIOPSY;  Surgeon: Jamesetta So, MD;  Location: [facility];  Service: General;  Laterality: Left;  . BREAST LUMPECTOMY Right 01/29/2015   Procedure: RIGHT BREAST LUMPECTOMY;  Surgeon: Fanny Skates, MD;  Location: [place];  Service: General;  Laterality: Right;  . CHOLECYSTECTOMY    . COLECTOMY    . COLONOSCOPY  09/11/01   Minimal internal hemorrhoids, otherwise normal rectum . Scattered pancolonic diverticula (few) The colonic mucosa otherwise appeared normal status post biopsies and stool sampling as described above.  . ESOPHAGOGASTRODUODENOSCOPY  10/11/2010   Normal esophagus, small hiatal hernia,Stenotic pyloric channel likely critical.  Likely contributing to some of the patient's symptoms, status post TTS balloon dilation Normal D1-D3 status post biopsy.  D2-D3 atrophic-appearing gastric mucosa status post biopsy  . ileocolonoscopy  10/11/2010   external hemorrhoidal tag.  Normal rectum status post biopsy Pancolonic diverticula status post segmental biopsy.  Normal  terminal ileum  . JOINT REPLACEMENT Bilateral   . KNEE ARTHROPLASTY Right 2004  . KNEE SURGERY Bilateral    2004- [facility]- total; knee  . right ovarian tumor removed as teenager    . SIMPLE MASTECTOMY WITH AXILLARY SENTINEL NODE BIOPSY Left 03/07/2013   Procedure: TOTAL  MASTECTOMY WITH AXILLARY SENTINEL NODE BIOPSY;  Surgeon: Adin Hector, MD;  Location: [place];  Service: General;  Laterality: Left;  . TOTAL KNEE ARTHROPLASTY  09/25/2011   Procedure: TOTAL KNEE ARTHROPLASTY;  Surgeon: Gearlean Alf, MD;  [facility];  Service: Orthopedics;  Laterality: Left;    Prior to Admission medications   Medication Sig Start Date End Date Taking? Authorizing Provider  acetaminophen (TYLENOL) 500 MG tablet Take 500-1,000 mg by mouth every 6 (six) hours as needed. Pain    Yes Historical Provider, MD    ALPRAZolam (XANAX) 0.5 MG tablet Take 0.5 mg by mouth 2 (two) times daily as needed for sleep or anxiety.   Yes Historical Provider, MD  aspirin 325 MG tablet Take 325 mg by mouth daily.   Yes Historical Provider, MD  atorvastatin (LIPITOR) 10 MG tablet Take 10 mg by mouth daily after breakfast.   Yes Historical Provider, MD  FLUoxetine (PROZAC) 20 MG capsule Take 20 mg by mouth daily.   Yes Historical Provider, MD  levothyroxine (SYNTHROID, LEVOTHROID) 50 MCG tablet Take 50 mcg by mouth daily before breakfast.    Yes Historical Provider, MD  loperamide (IMODIUM) 2 MG capsule Take by mouth as needed for diarrhea or loose stools.   Yes Historical Provider, MD  losartan (COZAAR) 50 MG tablet Take 100 mg by mouth daily.    Yes Historical Provider, MD  Probiotic Product (ALIGN PO) Take by mouth.   Yes Historical Provider, MD  Probiotic Product (ALIGN PO) Take by mouth.   Yes Historical Provider, MD  RABEprazole (ACIPHEX) 20 MG tablet Take 1 tablet (20 mg total) by mouth daily. 01/05/16  Yes Carlis Stable, NP  RESTASIS 0.05 % ophthalmic emulsion Place 1 drop into both eyes every 12 (twelve) hours.  01/11/11  Yes Historical Provider, MD  UNABLE TO FIND Rx: [place] (Quantity: 6) [id number]- Silicone Breast Prosthesis (Quantity: 1) Dx: 174.9; Left Mastectomy 05/01/13  Yes Alphonsa Overall, MD  UNABLE TO FIND Rx: [phone]- Mastectomy Form, left (Quantity: 1) Dx: 174.9; Left mastectomy 07/01/13  Yes Fanny Skates, MD  loperamide (IMODIUM) 2 MG capsule Take 2 mg by mouth as needed for diarrhea or loose stools.    Historical Provider, MD  rifaximin (XIFAXAN) 550 MG TABS tablet Take 1 tablet (550 mg total) by mouth 3 (three) times daily. For 14 days. Patient not taking: Reported on 03/07/2016 01/17/16   Daneil Dolin, MD  topiramate (TOPAMAX) 25 MG tablet Take 25 mg by mouth daily.    Historical Provider, MD    Allergies as of 03/07/2016  . (No Known Allergies)    Family History  Problem Relation Age  of Onset  . Stroke Mother     deceased after 3 strokes 09-13-1999  . Diabetes Father   . Stroke Maternal Grandmother     deceased after 3 strokes  . Stroke Sister     twin sister  . Colon cancer Neg Hx     Social History   Social History  . Marital status: Married    Spouse name: N/A  . Number of children: N/A  . Years of education: N/A   Occupational History  . retired Retired   Social History Main Topics  . Smoking status: Never Smoker  . Smokeless tobacco: Never Used     Comment: Never smoked  . Alcohol use Yes     Comment: occassional wine  . Drug use: No  . Sexual activity: No   Other Topics Concern  . Not on file   Social History Narrative  . No narrative on file    Review of Systems: See HPI, otherwise  negative ROS  Physical Exam: BP (!) 171/80   Pulse 71   Temp 98 F (36.7 C) (Oral)   Ht 5' (1.524 m)   Wt 106 lb 9.6 oz (48.4 kg)   BMI 20.82 kg/m  General:   Alert,  Well-developed, well-nourished, pleasant and cooperative in NAD Skin:  Intact without significant lesions or rashes. Eyes:  Sclera clear, no icterus.   Conjunctiva pink. Neck:  Supple; no masses or thyromegaly. No significant cervical adenopathy. Lungs:  Clear throughout to auscultation.   No wheezes, crackles, or rhonchi. No acute distress. Heart:  Regular rate and rhythm; no murmurs, clicks, rubs,  or gallops. Abdomen: nondistended. Positive bowel sounds. Soft with some minimal left lower quadrant to palpation. No appreciable mass or megaly. Pulses:  Normal pulses noted. Extremities:  Without clubbing or edema.  Impression:  Pleasant 82 year old  with intermittent diarrhea for years. Baseline IBS-D. Worsening of diarrhea since 2 rounds of antibiotics earlier this year for presumed diverticulitis. She's lost 7 pounds.  May be simply having an exacerbation of IBS (postinfectious IBS). Ned to consider the possibility of C. Difficile infection. Although biopsies were negative previously,  microscopic colitis remains in the differential. Patient wants something to "calm her colon Down".  Recommendations: Stool sample for GI pathogen panel and Clostridium difficile  If stool studies are negative,  she may need a colonoscopy to further evaluate diarrhea.  Trial of librax - one tablet before meals and at bedtime as needed (disp#40 with one refill)  Further recommendations to follow     Notice: This dictation was prepared with Dragon dictation along with smaller phrase technology. Any transcriptional errors that result from this process are unintentional and may not be corrected upon review.

## 2016-03-23 NOTE — Interval H&P Note (Signed)
History and Physical Interval Note:  03/23/2016 11:34 AM  Angela Galvan  has presented today for surgery, with the diagnosis of eval for microscopic colitis  The various methods of treatment have been discussed with the patient and family. After consideration of risks, benefits and other options for treatment, the patient has consented to  Procedure(s) with comments: COLONOSCOPY (N/A) - 1115 as a surgical intervention .  The patient's history has been reviewed, patient examined, no change in status, stable for surgery.  I have reviewed the patient's chart and labs.  Questions were answered to the patient's satisfaction.    No change. Stool studies negative for infection. Patient states Librax has been of great benefit in helping her lower GI tract symptoms. Diagnostic colonoscopy today per plan.  The risks, benefits, limitations, alternatives and imponderables have been reviewed with the patient. Questions have been answered. All parties are agreeable.  Manus Rudd

## 2016-03-23 NOTE — Discharge Instructions (Signed)

## 2016-03-24 ENCOUNTER — Encounter: Payer: Self-pay | Admitting: Internal Medicine

## 2016-03-28 ENCOUNTER — Encounter (HOSPITAL_COMMUNITY): Payer: Self-pay | Admitting: Internal Medicine

## 2016-04-18 ENCOUNTER — Ambulatory Visit (INDEPENDENT_AMBULATORY_CARE_PROVIDER_SITE_OTHER): Payer: Medicare Other | Admitting: Internal Medicine

## 2016-04-18 ENCOUNTER — Telehealth: Payer: Self-pay

## 2016-04-18 ENCOUNTER — Encounter: Payer: Self-pay | Admitting: Internal Medicine

## 2016-04-18 VITALS — BP 131/65 | HR 71 | Temp 97.8°F | Ht 60.0 in | Wt 105.4 lb

## 2016-04-18 DIAGNOSIS — K580 Irritable bowel syndrome with diarrhea: Secondary | ICD-10-CM

## 2016-04-18 DIAGNOSIS — K219 Gastro-esophageal reflux disease without esophagitis: Secondary | ICD-10-CM | POA: Diagnosis not present

## 2016-04-18 NOTE — Telephone Encounter (Signed)
Per Dr. Gala Romney, I called prescription to the pharmacy for Questran 2 gms daily #30 with 11 refills. Not to be given within 2 hours of other meds. Called to [place] at [facility].

## 2016-04-18 NOTE — Progress Notes (Signed)
Primary Care Physician:  Asencion Noble, MD Primary Gastroenterologist:  Dr. Gala Romney  Pre-Procedure History & Physical: HPI:  Angela Galvan is a 82 y.o. female here for follow-up of GERD and IBS-D. GERD well controlled on omeprazole. Continues to have diarrhea with bouts of incontinence about 7 days out of the past 30 days. Takes Imodium and Librax which does help when she has to tach. During the other days she may have one to no bowel movement on a given day. Recent biopsies negative for microscopic colitis.   C. difficile negative. Hasn't had any abdominal pain, nausea or vomiting. Weight stable  Past Medical History:  Diagnosis Date  . Anxiety   . Arthritis   . Cancer [facility])    breast cancer left with mastectomy  . 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 Depression   . Diabetes mellitus    no meds.-diet controlled  . Diarrhea   . Diverticulitis    s/p colectomy in 1991  . GERD (gastroesophageal reflux disease)   . HOH (hard of hearing)   . HTN (hypertension)    Does not see a cardiologist  . Hyperlipidemia   . Hypothyroidism   . IBS (irritable bowel syndrome)   . S/P colonoscopy 2003   minimal internal hemorrhoids, pancolonic diverticula, biopsy of rectum: lymphoplasmacytic microscopic colitis, colon biopsies normal  . S/P colonoscopy 2012   pancolonic diverticula, nl TI, external hemorrhoidal tag,/rectum bx no abnormalities  . S/P endoscopy 2012   normal esophagus, stenotic pyloric channel, TTS dilation  . Stroke ([place]) 08/10/2006   no residual    Past Surgical History:  Procedure Laterality Date  . ABDOMINAL HYSTERECTOMY  1983  . bilateral cataracts    . BIOPSY  03/23/2016   Procedure: BIOPSY;  Surgeon: Daneil Dolin, MD;  [facility];  Service: Endoscopy;;  colon   . BREAST BIOPSY Left 01/10/2013   Procedure: BREAST BIOPSY;  Surgeon: Jamesetta So, MD;  Location: [facility];  Service: General;  Laterality: Left;  . BREAST LUMPECTOMY Right 01/29/2015   Procedure: RIGHT BREAST LUMPECTOMY;  Surgeon: Fanny Skates, MD;  Location: [place];  Service: General;  Laterality: Right;  . CHOLECYSTECTOMY    . COLECTOMY    . COLONOSCOPY  09/11/01   Minimal internal hemorrhoids, otherwise normal rectum . Scattered pancolonic diverticula (few) The colonic mucosa otherwise appeared normal status post biopsies and stool sampling as described above.  . COLONOSCOPY N/A 03/23/2016   Procedure: COLONOSCOPY;  Surgeon: Daneil Dolin, MD;  [facility];  Service: Endoscopy;  Laterality: N/A;  1115  . ESOPHAGOGASTRODUODENOSCOPY  10/11/2010   Normal esophagus, small hiatal hernia,Stenotic pyloric channel likely critical.  Likely contributing to some of the patient's symptoms, status post TTS balloon dilation Normal D1-D3 status post biopsy.  D2-D3 atrophic-appearing gastric mucosa status post biopsy  . ileocolonoscopy  10/11/2010   external hemorrhoidal tag.  Normal rectum status post biopsy Pancolonic diverticula status post segmental biopsy.  Normal  terminal ileum  . JOINT REPLACEMENT Bilateral   . KNEE ARTHROPLASTY Right 2004  . KNEE SURGERY Bilateral    2004- [facility]- total; knee  . right ovarian tumor removed as teenager    . SIMPLE MASTECTOMY WITH AXILLARY SENTINEL NODE BIOPSY Left 03/07/2013   Procedure: TOTAL  MASTECTOMY WITH AXILLARY SENTINEL NODE BIOPSY;  Surgeon: Adin Hector, MD;  Location: [place];  Service: General;  Laterality: Left;  . TOTAL KNEE ARTHROPLASTY  09/25/2011   Procedure: TOTAL KNEE ARTHROPLASTY;  Surgeon: Gearlean Alf, MD;  Location: [facility];  Service: Orthopedics;  Laterality: Left;    Prior to Admission medications   Medication Sig Start Date End Date Taking? Authorizing Provider  acetaminophen (TYLENOL) 500 MG tablet Take 500-1,000 mg by mouth every 6 (six) hours as needed. Pain    Yes Historical Provider, MD  ALPRAZolam  (XANAX) 0.5 MG tablet Take 0.5 mg by mouth 2 (two) times daily as needed for sleep or anxiety.   Yes Historical Provider, MD  aspirin 325 MG tablet Take 325 mg by mouth daily.   Yes Historical Provider, MD  atorvastatin (LIPITOR) 10 MG tablet Take 10 mg by mouth daily after breakfast.   Yes Historical Provider, MD  clidinium-chlordiazePOXIDE (LIBRAX) 5-2.5 MG capsule Take 1 capsule by mouth 4 (four) times daily -  before meals and at bedtime. PRN 03/07/16  Yes Daneil Dolin, MD  FLUoxetine (PROZAC) 20 MG capsule Take 20 mg by mouth daily.   Yes Historical Provider, MD  levothyroxine (SYNTHROID, LEVOTHROID) 50 MCG tablet Take 50 mcg by mouth daily before breakfast.    Yes Historical Provider, MD  loperamide (IMODIUM) 2 MG capsule Take 2 mg by mouth as needed for diarrhea or loose stools.   Yes Historical Provider, MD  losartan (COZAAR) 100 MG tablet Take 100 mg by mouth daily.   Yes Historical Provider, MD  Probiotic Product (ALIGN PO) Take 1 tablet by mouth daily.    Yes Historical Provider, MD  RABEprazole (ACIPHEX) 20 MG tablet Take 1 tablet (20 mg total) by mouth daily. 01/05/16  Yes Carlis Stable, NP  RESTASIS 0.05 % ophthalmic emulsion Place 1 drop into both eyes every 12 (twelve) hours.  01/11/11  Yes Historical Provider, MD  topiramate (TOPAMAX) 25 MG tablet Take 25 mg by mouth at bedtime.    Yes Historical Provider, MD  UNABLE TO FIND Rx: [place] (Quantity: 6) [id number]- Silicone Breast Prosthesis (Quantity: 1) Dx: 174.9; Left Mastectomy 05/01/13  Yes Alphonsa Overall, MD  UNABLE TO FIND Rx: [phone]- Mastectomy Form, left (Quantity: 1) Dx: 174.9; Left mastectomy 07/01/13  Yes Fanny Skates, MD  Sod Picosulfate-Mag Ox-Cit Acd 10-3.5-12 MG-GM-GM PACK Take 1 Container by mouth as directed. Patient not taking: Reported on 04/18/2016 03/20/16   Daneil Dolin, MD    Allergies as of 04/18/2016  . (No Known Allergies)    Family History  Problem Relation Age of Onset  . Stroke Mother      deceased after 3 strokes 1999/09/15  . Diabetes Father   . Stroke Maternal Grandmother     deceased after 3 strokes  . Stroke Sister     twin sister  . Colon cancer Neg Hx     Social History   Social History  . Marital status: Married    Spouse name: N/A  . Number of children: N/A  . Years of education: N/A   Occupational History  . retired Retired   Social History Main Topics  . Smoking status: Never Smoker  . Smokeless tobacco: Never Used     Comment: Never smoked  . Alcohol use Yes     Comment: occassional wine  . Drug use: No  . Sexual activity: No   Other Topics Concern  . Not on file   Social History Narrative  . No narrative on file    Review of Systems: See  HPI, otherwise negative ROS  Physical Exam: BP 131/65   Pulse 71   Temp 97.8 F (36.6 C) (Oral)   Ht 5' (1.524 m)   Wt 105 lb 6.4 oz (47.8 kg)   BMI 20.58 kg/m  General:   Alert,  Well-developed, well-nourished, pleasant and cooperative in NAD   Impression/:  Pleasant 82 year old lady with GERD well controlled on Rabeprazole. She has intermittent diarrhea with episodes of fecal incontinence most consistent with her prior diagnosis of IBS D. She still having too many episodes of diarrhea and incontinence over 30 day period.  We recalled that she did very well for a time on low-dose Questran at bedtime previously. Not mentioned above, course of Xifaxan previously gave for long-standing remission from her IBS D symptoms. The latest round of Xifaxan really did not help very much.   Recommendations:  Begin Questran 2 grams daily -Off label use.-- not to be taken within 2 hours of other medications  Continue Rabeprazole 20 mg daily for reflux  May continue using Librax and Imodium as needed for diarrhea  Office visit in 6 weeks  Call and let us know how you are doing in 2 weeks    Notice: This dictation was prepared with Dragon dictation along with smaller phrase technology. Any transcriptional  errors that result from this process are unintentional and may not be corrected upon review.

## 2016-04-18 NOTE — Patient Instructions (Signed)
Begin Questran 2 grams daily - not to be taken within 2 hours of other medications  Continue Rabeprazole 20 mg daily for reflux  May continue using Librax and Imodium as needed for diarrhea  Office visit in 6 weeks  Call and let us know how you are doing in 2 weeks

## 2016-04-19 ENCOUNTER — Encounter: Payer: Self-pay | Admitting: Internal Medicine

## 2016-04-26 ENCOUNTER — Encounter: Payer: Self-pay | Admitting: Internal Medicine

## 2016-04-27 NOTE — Telephone Encounter (Signed)
Patient called in stating that she has been taking the Questran and she was constipated for two days.  However she 's had diarrhea today and her last bowel movement was today at 3:00 pm  Her last dose of Questran was last night at 9:00 pm  Routing to Dr. Gala Romney to see if he would like to change her dose.  Per the patient she may not be at home tomorrow and please leave recommendation on her answering machine

## 2016-04-28 NOTE — Telephone Encounter (Signed)
Stick with Lucrezia Starch 2 grams daily; keep a stool diary; lets see how she does over a months period of time

## 2016-04-28 NOTE — Telephone Encounter (Signed)
Tried to call pt- was told by a female that she was still asleep. Asked him to have her call me back. He said he would let her know.

## 2016-05-02 ENCOUNTER — Ambulatory Visit: Payer: Medicare Other | Admitting: Internal Medicine

## 2016-05-02 NOTE — Telephone Encounter (Signed)
Tried to call phone was busy

## 2016-05-03 NOTE — Telephone Encounter (Signed)
Pt is aware. Went over stool diary with her. She already has an appt in January.

## 2016-05-30 ENCOUNTER — Ambulatory Visit: Payer: Medicare Other | Admitting: Internal Medicine

## 2016-06-02 ENCOUNTER — Ambulatory Visit: Payer: Medicare Other | Admitting: Internal Medicine

## 2016-06-16 ENCOUNTER — Ambulatory Visit: Payer: Medicare Other | Admitting: Internal Medicine

## 2016-06-27 ENCOUNTER — Ambulatory Visit (INDEPENDENT_AMBULATORY_CARE_PROVIDER_SITE_OTHER): Payer: Medicare Other | Admitting: Internal Medicine

## 2016-06-27 ENCOUNTER — Encounter: Payer: Self-pay | Admitting: Internal Medicine

## 2016-06-27 VITALS — BP 113/66 | HR 67 | Temp 97.9°F | Ht 60.0 in | Wt 109.0 lb

## 2016-06-27 DIAGNOSIS — K219 Gastro-esophageal reflux disease without esophagitis: Secondary | ICD-10-CM | POA: Diagnosis not present

## 2016-06-27 DIAGNOSIS — K580 Irritable bowel syndrome with diarrhea: Secondary | ICD-10-CM

## 2016-06-27 NOTE — Patient Instructions (Signed)
Continue Questran 2 grams daily - off label as discussed  Continue Rabeprazole daily  May continue using Librax and / or imodium as needed for attacks of diarrhea   Try Digestive advantage - for gas/bloat instead of Align for gas symptoms  Office visit in 4 months

## 2016-06-27 NOTE — Progress Notes (Signed)
Primary Care Physician:  Asencion Noble, MD Primary Gastroenterologist:  Dr. Gala Romney  Pre-Procedure History & Physical: HPI:  Angela Galvan is a 83 y.o. female here for GERD and IBS-D symptoms. GERD continues to be well controlled on rabeprazole 20 mg daily. We started her on Questran 2 g off label for diarrhea for symptoms last year. She's been tolerating it very well. Not taking within 2 hours of other medications. She brings a stool diary. Reviewed. Apparently bouts of diarrhea have been decreased to about 3 over the past 1 month. She reports normal bowel function the remainder of the time. She is pleased. She does take Librax and or Imodium when necessary for diarrhea.  Past Medical History:  Diagnosis Date  . Anxiety   . Arthritis   . Cancer [facility])    breast cancer left with mastectomy  . 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 Depression   . Diabetes mellitus    no meds.-diet controlled  . Diarrhea   . Diverticulitis    s/p colectomy in 1991  . GERD (gastroesophageal reflux disease)   . HOH (hard of hearing)   . HTN (hypertension)    Does not see a cardiologist  . Hyperlipidemia   . Hypothyroidism   . IBS (irritable bowel syndrome)   . S/P colonoscopy 2003   minimal internal hemorrhoids, pancolonic diverticula, biopsy of rectum: lymphoplasmacytic microscopic colitis, colon biopsies normal  . S/P colonoscopy 2012   pancolonic diverticula, nl TI, external hemorrhoidal tag,/rectum bx no abnormalities  . S/P endoscopy 2012   normal esophagus, stenotic pyloric channel, TTS dilation  . Stroke ([place]) 08/10/2006   no residual    Past Surgical History:  Procedure Laterality Date  . ABDOMINAL HYSTERECTOMY  1983  . bilateral cataracts    . BIOPSY  03/23/2016   Procedure: BIOPSY;  Surgeon: Daneil Dolin, MD;  [facility];  Service: Endoscopy;;  colon   . BREAST BIOPSY Left 01/10/2013    Procedure: BREAST BIOPSY;  Surgeon: Jamesetta So, MD;  [facility];  Service: General;  Laterality: Left;  . BREAST LUMPECTOMY Right 01/29/2015   Procedure: RIGHT BREAST LUMPECTOMY;  Surgeon: Fanny Skates, MD;  Location: [place];  Service: General;  Laterality: Right;  . CHOLECYSTECTOMY    . COLECTOMY    . COLONOSCOPY  09/11/01   Minimal internal hemorrhoids, otherwise normal rectum . Scattered pancolonic diverticula (few) The colonic mucosa otherwise appeared normal status post biopsies and stool sampling as described above.  . COLONOSCOPY N/A 03/23/2016   Procedure: COLONOSCOPY;  Surgeon: Daneil Dolin, MD;  [facility];  Service: Endoscopy;  Laterality: N/A;  1115  . ESOPHAGOGASTRODUODENOSCOPY  10/11/2010   Normal esophagus, small hiatal hernia,Stenotic pyloric channel likely critical.  Likely contributing to some of the patient's symptoms, status post TTS balloon dilation Normal D1-D3 status post biopsy.  D2-D3 atrophic-appearing gastric mucosa status post biopsy  . ileocolonoscopy  10/11/2010   external hemorrhoidal tag.  Normal rectum status post biopsy Pancolonic diverticula status post segmental biopsy.  Normal  terminal ileum  . JOINT REPLACEMENT Bilateral   . KNEE ARTHROPLASTY Right 2004  . KNEE SURGERY Bilateral    2004- [facility]- total; knee  . right ovarian tumor removed as teenager    . SIMPLE MASTECTOMY WITH AXILLARY SENTINEL NODE BIOPSY Left 03/07/2013   Procedure: TOTAL  MASTECTOMY WITH AXILLARY SENTINEL  NODE BIOPSY;  Surgeon: Adin Hector, MD;  Location: [place];  Service: General;  Laterality: Left;  . TOTAL KNEE ARTHROPLASTY  09/25/2011   Procedure: TOTAL KNEE ARTHROPLASTY;  Surgeon: Gearlean Alf, MD;  [facility];  Service: Orthopedics;  Laterality: Left;    Prior to Admission medications   Medication Sig Start Date End Date Taking? Authorizing Provider  acetaminophen (TYLENOL) 500 MG tablet Take 500-1,000 mg by mouth every 6  (six) hours as needed. Pain    Yes Historical Provider, MD  ALPRAZolam (XANAX) 0.5 MG tablet Take 0.5 mg by mouth 2 (two) times daily as needed for sleep or anxiety.   Yes Historical Provider, MD  aspirin 325 MG tablet Take 325 mg by mouth daily.   Yes Historical Provider, MD  atorvastatin (LIPITOR) 10 MG tablet Take 10 mg by mouth daily after breakfast.   Yes Historical Provider, MD  cholestyramine (QUESTRAN) 4 g packet Take 4 g by mouth at bedtime. Takes 1/2 packet at bedtime 04/18/16  Yes Historical Provider, MD  clidinium-chlordiazePOXIDE (LIBRAX) 5-2.5 MG capsule Take 1 capsule by mouth 4 (four) times daily -  before meals and at bedtime. PRN 03/07/16  Yes Daneil Dolin, MD  FLUoxetine (PROZAC) 20 MG capsule Take 20 mg by mouth daily.   Yes Historical Provider, MD  levothyroxine (SYNTHROID, LEVOTHROID) 50 MCG tablet Take 50 mcg by mouth daily before breakfast.    Yes Historical Provider, MD  loperamide (IMODIUM) 2 MG capsule Take 2 mg by mouth as needed for diarrhea or loose stools.   Yes Historical Provider, MD  losartan (COZAAR) 100 MG tablet Take 100 mg by mouth daily.   Yes Historical Provider, MD  Probiotic Product (ALIGN PO) Take 1 tablet by mouth daily.    Yes Historical Provider, MD  RABEprazole (ACIPHEX) 20 MG tablet Take 1 tablet (20 mg total) by mouth daily. 01/05/16  Yes Carlis Stable, NP  RESTASIS 0.05 % ophthalmic emulsion Place 1 drop into both eyes every 12 (twelve) hours.  01/11/11  Yes Historical Provider, MD  topiramate (TOPAMAX) 25 MG tablet Take 25 mg by mouth daily.    Yes Historical Provider, MD  UNABLE TO FIND Rx: [place] (Quantity: 6) [id number]- Silicone Breast Prosthesis (Quantity: 1) Dx: 174.9; Left Mastectomy 05/01/13  Yes Alphonsa Overall, MD  UNABLE TO FIND Rx: [phone]- Mastectomy Form, left (Quantity: 1) Dx: 174.9; Left mastectomy 07/01/13  Yes Fanny Skates, MD  Sod Picosulfate-Mag Ox-Cit Acd 10-3.5-12 MG-GM-GM PACK Take 1 Container by mouth as  directed. Patient not taking: Reported on 04/18/2016 03/20/16   Daneil Dolin, MD    Allergies as of 06/27/2016  . (No Known Allergies)    Family History  Problem Relation Age of Onset  . Stroke Mother     deceased after 3 strokes Sep 10, 1999  . Diabetes Father   . Stroke Maternal Grandmother     deceased after 3 strokes  . Stroke Sister     twin sister  . Colon cancer Neg Hx     Social History   Social History  . Marital status: Married    Spouse name: N/A  . Number of children: N/A  . Years of education: N/A   Occupational History  . retired Retired   Social History Main Topics  . Smoking status: Never Smoker  . Smokeless tobacco: Never Used     Comment: Never smoked  . Alcohol use Yes     Comment: occassional wine  .  Drug use: No  . Sexual activity: No   Other Topics Concern  . Not on file   Social History Narrative  . No narrative on file    Review of Systems: See HPI, otherwise negative ROS  Physical Exam: BP 113/66   Pulse 67   Temp 97.9 F (36.6 C) (Oral)   Ht 5' (1.524 m)   Wt 109 lb (49.4 kg)   BMI 21.29 kg/m  General:   Alert,  Well-developed, well-nourished, pleasant and cooperative in NAD  Impression:  Pleasant 83 year old lady with GERD well controlled on rabeprazole.  IBS-D symptoms. Based on her report and review of her stool diary today, episodes of diarrhea and Rare fecal incontinence have decreased to about 3 times per month. All in all, this is an excellent response to treatment. I think we need to continue with this current regimen and see how it works for her over the next 2-3 months   Recommendations:  Continue Questran 2 grams daily - off label as discussed  Continue Rabeprazole daily  May continue using Librax and / or imodium as needed for attacks of diarrhea   Try Digestive advantage - for gas/bloat instead of Align for gas symptoms  Office visit in 4 months   Notice: This dictation was prepared with Dragon dictation  along with smaller phrase technology. Any transcriptional errors that result from this process are unintentional and may not be corrected upon review.

## 2016-08-24 ENCOUNTER — Other Ambulatory Visit: Payer: Self-pay | Admitting: Internal Medicine

## 2016-08-31 ENCOUNTER — Encounter: Payer: Self-pay | Admitting: Internal Medicine

## 2016-09-08 ENCOUNTER — Ambulatory Visit: Payer: Medicare Other | Admitting: Internal Medicine

## 2016-10-24 ENCOUNTER — Ambulatory Visit (INDEPENDENT_AMBULATORY_CARE_PROVIDER_SITE_OTHER): Payer: Medicare Other | Admitting: Internal Medicine

## 2016-10-24 ENCOUNTER — Encounter: Payer: Self-pay | Admitting: Internal Medicine

## 2016-10-24 VITALS — BP 166/64 | HR 61 | Temp 97.0°F | Ht 60.0 in | Wt 106.2 lb

## 2016-10-24 DIAGNOSIS — K219 Gastro-esophageal reflux disease without esophagitis: Secondary | ICD-10-CM

## 2016-10-24 DIAGNOSIS — Z8719 Personal history of other diseases of the digestive system: Secondary | ICD-10-CM | POA: Diagnosis not present

## 2016-10-24 NOTE — Progress Notes (Signed)
Primary Care Physician:  Asencion Noble, MD Primary Gastroenterologist:  Dr. Gala Romney  Pre-Procedure History & Physical: HPI:  Angela Galvan is a 83 y.o. female here for follow-up of a diarrhea/incontinence/GERD. Now on Questran 2 g daily and digestive advantage. Stool diary reviewed. She did have about 6 episodes in April; did very well in February, March and most of May. Overall, doing much better;  couple episodes of nocturnal incontinence but far away much improved over that previously seen. No bleeding. Tolerating 2 g of Questran daily. Hasn't had take any Imodium or Librax.  Reflux symptoms well controlled on AcipHex.   Past Medical History:  Diagnosis Date  . Anxiety   . Arthritis   . Cancer [facility])    breast cancer left with mastectomy  . 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 Depression   . Diabetes mellitus    no meds.-diet controlled  . Diarrhea   . Diverticulitis    s/p colectomy in 1991  . GERD (gastroesophageal reflux disease)   . HOH (hard of hearing)   . HTN (hypertension)    Does not see a cardiologist  . Hyperlipidemia   . Hypothyroidism   . IBS (irritable bowel syndrome)   . S/P colonoscopy 2003   minimal internal hemorrhoids, pancolonic diverticula, biopsy of rectum: lymphoplasmacytic microscopic colitis, colon biopsies normal  . S/P colonoscopy 2012   pancolonic diverticula, nl TI, external hemorrhoidal tag,/rectum bx no abnormalities  . S/P endoscopy 2012   normal esophagus, stenotic pyloric channel, TTS dilation  . Stroke ([place]) 08/10/2006   no residual    Past Surgical History:  Procedure Laterality Date  . ABDOMINAL HYSTERECTOMY  1983  . bilateral cataracts    . BIOPSY  03/23/2016   Procedure: BIOPSY;  Surgeon: Daneil Dolin, MD;  Location: [facility];  Service: Endoscopy;;  colon   . BREAST BIOPSY Left 01/10/2013   Procedure: BREAST BIOPSY;  Surgeon: Jamesetta So, MD;  Location: [facility];  Service: General;  Laterality: Left;  . BREAST LUMPECTOMY Right 01/29/2015   Procedure: RIGHT BREAST LUMPECTOMY;  Surgeon: Fanny Skates, MD;  Location: [place];  Service: General;  Laterality: Right;  . CHOLECYSTECTOMY    . COLECTOMY    . COLONOSCOPY  09/11/01   Minimal internal hemorrhoids, otherwise normal rectum . Scattered pancolonic diverticula (few) The colonic mucosa otherwise appeared normal status post biopsies and stool sampling as described above.  . COLONOSCOPY N/A 03/23/2016   Procedure: COLONOSCOPY;  Surgeon: Daneil Dolin, MD;  Location: [facility];  Service: Endoscopy;  Laterality: N/A;  1115  . ESOPHAGOGASTRODUODENOSCOPY  10/11/2010   Normal esophagus, small hiatal hernia,Stenotic pyloric channel likely critical.  Likely contributing to some of the patient's symptoms, status post TTS balloon dilation Normal D1-D3 status post biopsy.  D2-D3 atrophic-appearing gastric mucosa status post biopsy  . ileocolonoscopy  10/11/2010   external hemorrhoidal tag.  Normal rectum status post biopsy Pancolonic diverticula status post segmental biopsy.  Normal  terminal ileum  . JOINT REPLACEMENT Bilateral   . KNEE ARTHROPLASTY Right 2004  . KNEE SURGERY Bilateral    2004- [facility]- total; knee  . right ovarian tumor removed as teenager    . SIMPLE MASTECTOMY WITH AXILLARY SENTINEL NODE BIOPSY Left 03/07/2013   Procedure: TOTAL  MASTECTOMY WITH AXILLARY SENTINEL NODE BIOPSY;  Surgeon: Adin Hector, MD;  Location: [place];  Service: General;  Laterality: Left;  . TOTAL KNEE ARTHROPLASTY  09/25/2011   Procedure: TOTAL KNEE ARTHROPLASTY;  Surgeon: Gearlean Alf, MD;  Location: [facility];  Service: Orthopedics;  Laterality: Left;    Prior to Admission medications   Medication Sig Start Date End Date Taking? Authorizing Provider  acetaminophen (TYLENOL) 500 MG tablet Take 500-1,000 mg by mouth every 6 (six) hours as needed. Pain    Yes [provider]  ALPRAZolam (XANAX) 0.5 MG tablet Take 0.5 mg by mouth 2 (two) times daily as needed for sleep or anxiety.   Yes [provider]  aspirin 325 MG tablet Take 325 mg by mouth daily.   Yes [provider]  atorvastatin (LIPITOR) 10 MG tablet Take 10 mg by mouth daily after breakfast.   Yes [provider]  cholestyramine (QUESTRAN) 4 g packet Take 4 g by mouth at bedtime. Takes 1/2 packet at bedtime 04/18/16  Yes [provider]  clidinium-chlordiazePOXIDE (LIBRAX) 5-2.5 MG capsule TAKE 1 CAPSULE BY MOUTH FOUR TIMES DAILY BEFORE MEALS AND AT BEDTIME AS NEEDED. 08/24/16  Yes Carlis Stable, NP  FLUoxetine (PROZAC) 20 MG capsule Take 20 mg by mouth daily.   Yes [provider]  levothyroxine (SYNTHROID, LEVOTHROID) 50 MCG tablet Take 50 mcg by mouth daily before breakfast.    Yes [provider]  loperamide (IMODIUM) 2 MG capsule Take 2 mg by mouth as needed for diarrhea or loose stools.   Yes [provider]  losartan (COZAAR) 100 MG tablet Take 100 mg by mouth daily.   Yes [provider]  Probiotic Product (ALIGN PO) Take 1 tablet by mouth daily.    Yes [provider]  RABEprazole (ACIPHEX) 20 MG tablet Take 1 tablet (20 mg total) by mouth daily. 01/05/16  Yes Walden Field A, NP  RESTASIS 0.05 % ophthalmic emulsion Place 1 drop into both eyes every 12 (twelve) hours.  01/11/11  Yes [provider]  topiramate (TOPAMAX) 25 MG tablet Take 25 mg by mouth daily.    Yes [provider]  UNABLE TO FIND Rx: [place] (Quantity: 6) I5027- Silicone Breast Prosthesis (Quantity: 1) Dx: 174.9; Left Mastectomy 05/01/13  Yes Alphonsa Overall, MD  UNABLE TO FIND Rx: [id number]- Mastectomy Form, left (Quantity: 1) Dx: 174.9; Left mastectomy 07/01/13  Yes Fanny Skates, MD  Sod Picosulfate-Mag Ox-Cit Acd 10-3.5-12 MG-GM-GM PACK Take 1 Container by mouth as directed. Patient not taking: Reported on  10/24/2016 03/20/16   Daneil Dolin, MD    Allergies as of 10/24/2016  . (No Known Allergies)    Family History  Problem Relation Age of Onset  . Stroke Mother        deceased after 3 strokes September 10, 1999  . Diabetes Father   . Stroke Maternal Grandmother        deceased after 3 strokes  . Stroke Sister        twin sister  . Colon cancer Neg Hx     Social History   Social History  . Marital status: Married    Spouse name: N/A  . Number of children: N/A  . Years of education: N/A   Occupational History  . retired Retired   Social History Main Topics  . Smoking status: Never Smoker  . Smokeless tobacco: Never Used     Comment: Never smoked  . Alcohol use Yes     Comment: occassional wine  . Drug use: No  . Sexual  activity: No   Other Topics Concern  . Not on file   Social History Narrative  . No narrative on file    Review of Systems: See HPI, otherwise negative ROS  Physical Exam: BP (!) 166/64   Pulse 61   Temp 97 F (36.1 C) (Oral)   Ht 5' (1.524 m)   Wt 106 lb 3.2 oz (48.2 kg)   BMI 20.74 kg/m  General:   Alert,  Well-developed, well-nourished, pleasant and cooperative in NAD   Impression:  IBS-D.  I think we are closing in on her symptoms with Questran. She is tolerating this medication at a low dose off label.  Increase dose of Questran may well be of additional benefit.   Recommendations:  Increase Questran to 4 grams daily - not to be taken within 2 hours of other medications  Continue digestive Advantage daily  Continue Aciphex for GERD daily  Use Imodium / librax as needed  Office visit in 3 months       GERD symptoms well controlled on rabeprazole. She likes taking the probiotic and feels that regulates her GI tract.                   Notice: This dictation was prepared with Dragon dictation along with smaller phrase technology. Any transcriptional errors that result from this process are unintentional and may not  be corrected upon review.

## 2016-10-24 NOTE — Patient Instructions (Addendum)
Increase Questran to 4 grams daily - not to be taken within 2 hours of other medications  Continue digestive Advantage daily  Continue Aciphex for GERD daily  Use Imodium / librax as needed  Office visit in 3 months

## 2016-11-01 ENCOUNTER — Other Ambulatory Visit (HOSPITAL_COMMUNITY): Payer: Self-pay | Admitting: Internal Medicine

## 2016-11-01 DIAGNOSIS — Z1231 Encounter for screening mammogram for malignant neoplasm of breast: Secondary | ICD-10-CM

## 2016-11-03 ENCOUNTER — Encounter (HOSPITAL_COMMUNITY): Payer: Self-pay | Admitting: Emergency Medicine

## 2016-11-03 ENCOUNTER — Emergency Department (HOSPITAL_COMMUNITY): Payer: Medicare Other

## 2016-11-03 ENCOUNTER — Observation Stay (HOSPITAL_COMMUNITY)
Admission: EM | Admit: 2016-11-03 | Discharge: 2016-11-04 | Disposition: A | Discharge status: Home / Self Care | Payer: Medicare Other | Attending: Internal Medicine | Admitting: Internal Medicine

## 2016-11-03 DIAGNOSIS — S0081XA Abrasion of other part of head, initial encounter: Secondary | ICD-10-CM | POA: Diagnosis not present

## 2016-11-03 DIAGNOSIS — E119 Type 2 diabetes mellitus without complications: Secondary | ICD-10-CM

## 2016-11-03 DIAGNOSIS — R55 Syncope and collapse: Secondary | ICD-10-CM | POA: Diagnosis present

## 2016-11-03 DIAGNOSIS — S0033XA Contusion of nose, initial encounter: Secondary | ICD-10-CM | POA: Diagnosis not present

## 2016-11-03 DIAGNOSIS — S0990XA Unspecified injury of head, initial encounter: Secondary | ICD-10-CM | POA: Diagnosis present

## 2016-11-03 DIAGNOSIS — E039 Hypothyroidism, unspecified: Secondary | ICD-10-CM | POA: Insufficient documentation

## 2016-11-03 DIAGNOSIS — Z79899 Other long term (current) drug therapy: Secondary | ICD-10-CM | POA: Insufficient documentation

## 2016-11-03 DIAGNOSIS — W19XXXA Unspecified fall, initial encounter: Secondary | ICD-10-CM | POA: Diagnosis not present

## 2016-11-03 DIAGNOSIS — Z853 Personal history of malignant neoplasm of breast: Secondary | ICD-10-CM | POA: Insufficient documentation

## 2016-11-03 DIAGNOSIS — K219 Gastro-esophageal reflux disease without esophagitis: Secondary | ICD-10-CM | POA: Diagnosis present

## 2016-11-03 DIAGNOSIS — Y92481 Parking lot as the place of occurrence of the external cause: Secondary | ICD-10-CM | POA: Insufficient documentation

## 2016-11-03 DIAGNOSIS — Y999 Unspecified external cause status: Secondary | ICD-10-CM | POA: Insufficient documentation

## 2016-11-03 DIAGNOSIS — Y9302 Activity, running: Secondary | ICD-10-CM | POA: Diagnosis not present

## 2016-11-03 DIAGNOSIS — Z7982 Long term (current) use of aspirin: Secondary | ICD-10-CM | POA: Diagnosis not present

## 2016-11-03 DIAGNOSIS — S0083XA Contusion of other part of head, initial encounter: Principal | ICD-10-CM

## 2016-11-03 DIAGNOSIS — K589 Irritable bowel syndrome without diarrhea: Secondary | ICD-10-CM | POA: Diagnosis present

## 2016-11-03 DIAGNOSIS — I10 Essential (primary) hypertension: Secondary | ICD-10-CM | POA: Diagnosis not present

## 2016-11-03 LAB — COMPREHENSIVE METABOLIC PANEL
ALT: 25 U/L (ref 14–54)
ANION GAP: 10 (ref 5–15)
AST: 25 U/L (ref 15–41)
Albumin: 4.4 g/dL (ref 3.5–5.0)
Alkaline Phosphatase: 81 U/L (ref 38–126)
BUN: 22 mg/dL — ABNORMAL HIGH (ref 6–20)
CHLORIDE: 107 mmol/L (ref 101–111)
CO2: 21 mmol/L — ABNORMAL LOW (ref 22–32)
Calcium: 9.3 mg/dL (ref 8.9–10.3)
Creatinine, Ser: 1.05 mg/dL — ABNORMAL HIGH (ref 0.44–1.00)
GFR, EST AFRICAN AMERICAN: 56 mL/min — AB (ref >60)
GFR, EST NON AFRICAN AMERICAN: 48 mL/min — AB (ref >60)
Glucose, Bld: 100 mg/dL — ABNORMAL HIGH (ref 65–99)
POTASSIUM: 3.8 mmol/L (ref 3.5–5.1)
Sodium: 138 mmol/L (ref 135–145)
TOTAL PROTEIN: 7.8 g/dL (ref 6.5–8.1)
Total Bilirubin: 1.2 mg/dL (ref 0.3–1.2)

## 2016-11-03 LAB — CBC WITH DIFFERENTIAL/PLATELET
BASOS ABS: 0.1 10*3/uL (ref 0.0–0.1)
BASOS PCT: 1 %
EOS PCT: 1 %
Eosinophils Absolute: 0.1 10*3/uL (ref 0.0–0.7)
HEMATOCRIT: 40.7 % (ref 36.0–46.0)
Hemoglobin: 13.8 g/dL (ref 12.0–15.0)
LYMPHS PCT: 30 %
Lymphs Abs: 2.6 10*3/uL (ref 0.7–4.0)
MCH: 30.9 pg (ref 26.0–34.0)
MCHC: 33.9 g/dL (ref 30.0–36.0)
MCV: 91.3 fL (ref 78.0–100.0)
Monocytes Absolute: 0.9 10*3/uL (ref 0.1–1.0)
Monocytes Relative: 10 %
NEUTROS ABS: 5.2 10*3/uL (ref 1.7–7.7)
Neutrophils Relative %: 58 %
PLATELETS: 337 10*3/uL (ref 150–400)
RBC: 4.46 MIL/uL (ref 3.87–5.11)
RDW: 13.1 % (ref 11.5–15.5)
WBC: 8.7 10*3/uL (ref 4.0–10.5)

## 2016-11-03 LAB — URINALYSIS, ROUTINE W REFLEX MICROSCOPIC
BILIRUBIN URINE: NEGATIVE
GLUCOSE, UA: NEGATIVE mg/dL
HGB URINE DIPSTICK: NEGATIVE
KETONES UR: NEGATIVE mg/dL
LEUKOCYTES UA: NEGATIVE
Nitrite: NEGATIVE
PROTEIN: NEGATIVE mg/dL
Specific Gravity, Urine: 1.012 (ref 1.005–1.030)
pH: 5 (ref 5.0–8.0)

## 2016-11-03 LAB — GLUCOSE, CAPILLARY: Glucose-Capillary: 132 mg/dL — ABNORMAL HIGH (ref 65–99)

## 2016-11-03 LAB — CBG MONITORING, ED: Glucose-Capillary: 107 mg/dL — ABNORMAL HIGH (ref 65–99)

## 2016-11-03 LAB — TROPONIN I

## 2016-11-03 MED ORDER — LOSARTAN POTASSIUM 50 MG PO TABS
100.0000 mg | ORAL_TABLET | Freq: Every day | ORAL | Status: DC
  Administered 2016-11-04: 100 mg via ORAL
  Filled 2016-11-03: qty 2

## 2016-11-03 MED ORDER — LEVOTHYROXINE SODIUM 50 MCG PO TABS
50.0000 ug | ORAL_TABLET | Freq: Every day | ORAL | Status: DC
  Administered 2016-11-04: 50 ug via ORAL
  Filled 2016-11-03: qty 1

## 2016-11-03 MED ORDER — FLUOXETINE HCL 20 MG PO CAPS
20.0000 mg | ORAL_CAPSULE | Freq: Every day | ORAL | Status: DC
  Administered 2016-11-04: 20 mg via ORAL
  Filled 2016-11-03: qty 1

## 2016-11-03 MED ORDER — CHOLESTYRAMINE LIGHT 4 G PO PACK
PACK | ORAL | Status: AC
  Filled 2016-11-03: qty 1

## 2016-11-03 MED ORDER — PANTOPRAZOLE SODIUM 40 MG PO TBEC
40.0000 mg | DELAYED_RELEASE_TABLET | Freq: Every day | ORAL | Status: DC
  Administered 2016-11-04: 40 mg via ORAL
  Filled 2016-11-03: qty 1

## 2016-11-03 MED ORDER — ONDANSETRON HCL 4 MG/2ML IJ SOLN: 4.0000 mg | Freq: Four times a day (QID) | INTRAMUSCULAR | Status: DC | PRN

## 2016-11-03 MED ORDER — CYCLOSPORINE 0.05 % OP EMUL
1.0000 [drp] | Freq: Two times a day (BID) | OPHTHALMIC | Status: DC
  Administered 2016-11-03 – 2016-11-04 (×2): 1 [drp] via OPHTHALMIC
  Filled 2016-11-03 (×2): qty 1

## 2016-11-03 MED ORDER — ONDANSETRON HCL 4 MG PO TABS: 4.0000 mg | ORAL_TABLET | Freq: Four times a day (QID) | ORAL | Status: DC | PRN

## 2016-11-03 MED ORDER — SODIUM CHLORIDE 0.9% FLUSH
3.0000 mL | Freq: Two times a day (BID) | INTRAVENOUS | Status: DC
  Administered 2016-11-03 – 2016-11-04 (×2): 3 mL via INTRAVENOUS

## 2016-11-03 MED ORDER — ALPRAZOLAM 0.5 MG PO TABS: 0.5000 mg | ORAL_TABLET | Freq: Two times a day (BID) | ORAL | Status: DC | PRN

## 2016-11-03 MED ORDER — CILIDINIUM-CHLORDIAZEPOXIDE 2.5-5 MG PO CAPS
1.0000 | ORAL_CAPSULE | Freq: Three times a day (TID) | ORAL | Status: DC
  Administered 2016-11-04: 1 via ORAL
  Filled 2016-11-03 (×11): qty 1

## 2016-11-03 MED ORDER — LOPERAMIDE HCL 2 MG PO CAPS: 2.0000 mg | ORAL_CAPSULE | ORAL | Status: DC | PRN

## 2016-11-03 MED ORDER — CHOLESTYRAMINE 4 G PO PACK
1.0000 | PACK | ORAL | Status: DC
  Administered 2016-11-03: 1 via ORAL
  Filled 2016-11-03 (×3): qty 1

## 2016-11-03 MED ORDER — TOPIRAMATE 25 MG PO TABS
25.0000 mg | ORAL_TABLET | Freq: Every day | ORAL | Status: DC
  Administered 2016-11-04: 25 mg via ORAL
  Filled 2016-11-03: qty 1

## 2016-11-03 MED ORDER — ASPIRIN 325 MG PO TABS
325.0000 mg | ORAL_TABLET | Freq: Every day | ORAL | Status: DC
  Administered 2016-11-04: 325 mg via ORAL
  Filled 2016-11-03: qty 1

## 2016-11-03 MED ORDER — ATORVASTATIN CALCIUM 10 MG PO TABS
10.0000 mg | ORAL_TABLET | Freq: Every day | ORAL | Status: DC
Start: 2016-11-04 — End: 2016-11-04
  Administered 2016-11-04: 10 mg via ORAL
  Filled 2016-11-03: qty 1

## 2016-11-03 NOTE — ED Provider Notes (Signed)
[facility] DEPT Provider Note   CSN: [id number] Arrival date & time: 11/03/16  1546     History   Chief Complaint Chief Complaint  Patient presents with  . Loss of Consciousness  . Fall    HPI Vearl B Wehling is a 83 y.o. female.   Loss of Consciousness    Fall     Pt was seen at 1555. Per pt, c/o sudden onset and resolution of one episode of syncope that occurred PTA. Pt states she was running errands and then went to [facility] to get ice cream. States she does not remember driving into their parking lot or getting out of the car. Pt was found face down on the ground by bystanders. Pt states she had a similar episode 7 months ago but did not seek medical treatment. Pt denies prodromal symptoms before both syncopal episodes. Denies confusion/AMS, no reported seizure activity, no headache, no neck or back pain, no CP/palpitations, no SOB/cough, no abd pain, no N/V/D, no focal motor weakness, no tingling/numbness in extremities.   Past Medical History:  Diagnosis Date  . Anxiety   . Arthritis   . Cancer [facility])    breast cancer left with mastectomy  . 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 Depression   . Diabetes mellitus    no meds.-diet controlled  . Diarrhea   . Diverticulitis    s/p colectomy in 1991  . GERD (gastroesophageal reflux disease)   . HOH (hard of hearing)   . HTN (hypertension)    Does not see a cardiologist  . Hyperlipidemia   . Hypothyroidism   . IBS (irritable bowel syndrome)   . S/P colonoscopy 2003   minimal internal hemorrhoids, pancolonic diverticula, biopsy of rectum: lymphoplasmacytic microscopic colitis, colon biopsies normal  . S/P colonoscopy 2012   pancolonic diverticula, nl TI, external hemorrhoidal tag,/rectum bx no abnormalities  . S/P endoscopy 2012   normal esophagus, stenotic pyloric channel, TTS dilation  . Stroke ([place]) 08/10/2006   no residual      Patient Active Problem List   Diagnosis Date Noted  . Breast mass, right 01/29/2015  . LLQ pain 10/29/2014  . IBS (irritable bowel syndrome) 10/29/2014  . 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/07/2013  . Paget's disease of left breast ([place]) 01/24/2013  . Psychophysiologic insomnia 06/20/2012  . 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 Stiffness of joint, not elsewhere classified, lower leg 10/24/2011  . Difficulty in walking(719.7) 10/24/2011  . Pain in joint, lower leg 10/24/2011  . Postop Acute blood loss anemia 09/27/2011  . OA (osteoarthritis) of knee 09/25/2011  . GERD (gastroesophageal reflux disease) 09/15/2011  . Epigastric pain 09/29/2010  . Diarrhea 09/29/2010    Past Surgical History:  Procedure Laterality Date  . ABDOMINAL HYSTERECTOMY  1983  . bilateral cataracts    . BIOPSY  03/23/2016   Procedure: BIOPSY;  Surgeon: Daneil Dolin, MD;  Location: [facility];  Service: Endoscopy;;  colon   . BREAST BIOPSY Left 01/10/2013   Procedure: BREAST BIOPSY;  Surgeon: Jamesetta So, MD;  Location: [facility] OR[facility];  Service: General;  Laterality: Left;  . BREAST LUMPECTOMY Right 01/29/2015   Procedure: RIGHT BREAST LUMPECTOMY;  Surgeon: Fanny Skates, MD;  Location: [facility];  Service: General;  Laterality: Right;  . CHOLECYSTECTOMY    . COLECTOMY    . COLONOSCOPY  09/11/01  Minimal internal hemorrhoids, otherwise normal rectum . Scattered pancolonic diverticula (few) The colonic mucosa otherwise appeared normal status post biopsies and stool sampling as described above.  . COLONOSCOPY N/A 03/23/2016   Procedure: COLONOSCOPY;  Surgeon: Daneil Dolin, MD;  Location: [facility];  Service: Endoscopy;  Laterality: N/A;  1115  . ESOPHAGOGASTRODUODENOSCOPY  10/11/2010   Normal esophagus, small hiatal hernia,Stenotic pyloric channel likely critical.  Likely contributing to some of the patient's symptoms, status post TTS balloon dilation Normal D1-D3  status post biopsy.  D2-D3 atrophic-appearing gastric mucosa status post biopsy  . ileocolonoscopy  10/11/2010   external hemorrhoidal tag.  Normal rectum status post biopsy Pancolonic diverticula status post segmental biopsy.  Normal  terminal ileum  . JOINT REPLACEMENT Bilateral   . KNEE ARTHROPLASTY Right 2004  . KNEE SURGERY Bilateral    2004- [facility]- total; knee  . right ovarian tumor removed as teenager    . SIMPLE MASTECTOMY WITH AXILLARY SENTINEL NODE BIOPSY Left 03/07/2013   Procedure: TOTAL  MASTECTOMY WITH AXILLARY SENTINEL NODE BIOPSY;  Surgeon: Adin Hector, MD;  Location: [facility];  Service: General;  Laterality: Left;  . TOTAL KNEE ARTHROPLASTY  09/25/2011   Procedure: TOTAL KNEE ARTHROPLASTY;  Surgeon: Gearlean Alf, MD;  Location: [facility];  Service: Orthopedics;  Laterality: Left;    OB History    No data available       Home Medications    Prior to Admission medications   Medication Sig Start Date End Date Taking? Authorizing Provider  acetaminophen (TYLENOL) 500 MG tablet Take 500-1,000 mg by mouth every 6 (six) hours as needed. Pain     [provider]  ALPRAZolam (XANAX) 0.5 MG tablet Take 0.5 mg by mouth 2 (two) times daily as needed for sleep or anxiety.    [provider]  aspirin 325 MG tablet Take 325 mg by mouth daily.    [provider]  atorvastatin (LIPITOR) 10 MG tablet Take 10 mg by mouth daily after breakfast.    [provider]  cholestyramine (QUESTRAN) 4 g packet Take 4 g by mouth at bedtime. Takes 1/2 packet at bedtime 04/18/16   [provider]  clidinium-chlordiazePOXIDE (LIBRAX) 5-2.5 MG capsule TAKE 1 CAPSULE BY MOUTH FOUR TIMES DAILY BEFORE MEALS AND AT BEDTIME AS NEEDED. 08/24/16   Carlis Stable, NP  FLUoxetine (PROZAC) 20 MG capsule Take 20 mg by mouth daily.    [provider]  levothyroxine (SYNTHROID, LEVOTHROID) 50 MCG tablet Take 50 mcg by mouth daily before breakfast.     [provider]  loperamide (IMODIUM) 2 MG capsule Take 2 mg by mouth as needed for diarrhea or loose stools.    [provider]  losartan (COZAAR) 100 MG tablet Take 100 mg by mouth daily.    [provider]  Probiotic Product (ALIGN PO) Take 1 tablet by mouth daily.     [provider]  RABEprazole (ACIPHEX) 20 MG tablet Take 1 tablet (20 mg total) by mouth daily. 01/05/16   Carlis Stable, NP  RESTASIS 0.05 % ophthalmic emulsion Place 1 drop into both eyes every 12 (twelve) hours.  01/11/11   [provider]  Sod Picosulfate-Mag Ox-Cit Acd 10-3.5-12 MG-GM-GM PACK Take 1 Container by mouth as directed. Patient not taking: Reported on 10/24/2016 03/20/16   Daneil Dolin, MD  topiramate (TOPAMAX) 25 MG tablet Take 25 mg by mouth daily.     [provider]  UNABLE TO  FIND Rx: L8000- Post Surgical Bras (Quantity: 6) R4270- Silicone Breast Prosthesis (Quantity: 1) Dx: 174.9; Left Mastectomy 05/01/13   Alphonsa Overall, MD  UNABLE TO FIND Rx: [id number]- Mastectomy Form, left (Quantity: 1) Dx: 174.9; Left mastectomy 07/01/13   Fanny Skates, MD    Family History Family History  Problem Relation Age of Onset  . Stroke Mother        deceased after 3 strokes 1999/09/24  . Diabetes Father   . Stroke Maternal Grandmother        deceased after 3 strokes  . Stroke Sister        twin sister  . Colon cancer Neg Hx     Social History Social History  Substance Use Topics  . Smoking status: Never Smoker  . Smokeless tobacco: Never Used     Comment: Never smoked  . Alcohol use Yes     Comment: occassional wine     Allergies   Patient has no known allergies.   Review of Systems Review of Systems  Cardiovascular: Positive for syncope.  ROS: Statement: All systems negative except as marked or noted in the HPI; Constitutional: Negative for fever and chills. ; ; Eyes: Negative for eye pain, redness and discharge. ; ; ENMT: Negative for ear pain, hoarseness, nasal  congestion, sinus pressure and sore throat. ; ; Cardiovascular: Negative for chest pain, palpitations, diaphoresis, dyspnea and peripheral edema. ; ; Respiratory: Negative for cough, wheezing and stridor. ; ; Gastrointestinal: Negative for nausea, vomiting, diarrhea, abdominal pain, blood in stool, hematemesis, jaundice and rectal bleeding. . ; ; Genitourinary: Negative for dysuria, flank pain and hematuria. ; ; Musculoskeletal: +head and face injury. Negative for back pain and neck pain. Negative for swelling and deformity.; ; Skin: +abrasions, contusion. Negative for pruritus, rash, blisters, and skin lesion.; ; Neuro: Negative for headache, lightheadedness and neck stiffness. Negative for weakness, extremity weakness, paresthesias, involuntary movement, seizure and +syncope.        Physical Exam Updated Vital Signs BP (!) 151/59 (BP Location: Right Arm)   Pulse 67   Temp 98.1 F (36.7 C) (Oral)   Resp 18   Ht 5' (1.524 m)   Wt 48.1 kg (106 lb)   SpO2 100%   BMI 20.70 kg/m     16:43 Orthostatic Vital Signs CS  Orthostatic Lying   BP- Lying: 178/66  Pulse- Lying: 66      Orthostatic Sitting  BP- Sitting:  164/94  Pulse- Sitting: 69   16:44:51 ED Notes CS  Pt is not able to stand up at this time.      Physical Exam 1600: Physical examination: Vital signs and O2 SAT: Reviewed; Constitutional: Well developed, Well nourished, Well hydrated, In no acute distress; Head and Face: Normocephalic, No scalp hematomas, no lacs.  Non-tender to palp superior and inferior orbital rim areas.  No zygoma tenderness.  No mandibular tenderness. +superficial abrasion and contusions to bridge of nose, right chin, bilat zygoma.; Eyes: EOMI, PERRL, No scleral icterus; ENMT: Mouth and pharynx normal, Left TM normal, Right TM normal, Mucous membranes moist, +teeth and tongue intact.  No intraoral or intranasal bleeding.  No septal hematomas.  No trismus, no malocclusion.;  Neck: Immobilized in C-collar,  Trachea midline; Spine: No midline CS, TS, LS tenderness.; Cardiovascular: Regular rate and rhythm, No gallop; Respiratory: Breath sounds clear & equal bilaterally, No wheezes, Normal respiratory effort/excursion; Chest: Nontender, No deformity, Movement normal, No crepitus, No abrasions or ecchymosis.; Abdomen: Soft, Nontender, Nondistended, Normal bowel sounds, No  abrasions or ecchymosis.; Genitourinary: No CVA tenderness;; Extremities: No deformity, Full range of motion major/large joints of bilat UE's and LE's without pain or tenderness to palp, Neurovascularly intact, Pulses normal, No tenderness, No edema, Pelvis stable; Neuro: AA&Ox3, Major CN grossly intact. Speech clear.  No facial droop.  No nystagmus. Grips equal. Strength 5/5 equal bilat UE's and LE's.  DTR 2/4 equal bilat UE's and LE's.  No gross sensory deficits.  Normal cerebellar testing bilat UE's (finger-nose) and LE's (heel-shin).; Skin: Color normal, Warm, Dry    ED Treatments / Results  Labs (all labs ordered are listed, but only abnormal results are displayed)   EKG  EKG Interpretation  Date/Time:  Friday Jaretzy 08 2018 15:53:07 EDT Ventricular Rate:  70 PR Interval:    QRS Duration: 99 QT Interval:  441 QTC Calculation: 476 R Axis:   49 Text Interpretation:  Sinus rhythm RSR' in V1 or V2, right VCD or RVH When compared with ECG of 01/27/2015 No significant change was found Confirmed by [facility]  MD, Nunzio Cory [phone]) on 11/03/2016 4:01:24 PM       Radiology   Procedures Procedures (including critical care time)  Medications Ordered in ED Medications - No data to display   Initial Impression / Assessment and Plan / ED Course  I have reviewed the triage vital signs and the nursing notes.  Pertinent labs & imaging results that were available during my care of the patient were reviewed by me and considered in my medical decision making (see chart for details).  MDM Reviewed: previous chart, nursing note and  vitals Reviewed previous: labs and ECG Interpretation: labs, ECG, x-ray and CT scan   Results for orders placed or performed during the hospital encounter of 11/03/16  Urinalysis, Routine w reflex microscopic  Result Value Ref Range   Color, Urine YELLOW YELLOW   APPearance CLEAR CLEAR   Specific Gravity, Urine 1.012 1.005 - 1.030   pH 5.0 5.0 - 8.0   Glucose, UA NEGATIVE NEGATIVE mg/dL   Hgb urine dipstick NEGATIVE NEGATIVE   Bilirubin Urine NEGATIVE NEGATIVE   Ketones, ur NEGATIVE NEGATIVE mg/dL   Protein, ur NEGATIVE NEGATIVE mg/dL   Nitrite NEGATIVE NEGATIVE   Leukocytes, UA NEGATIVE NEGATIVE  Comprehensive metabolic panel  Result Value Ref Range   Sodium 138 135 - 145 mmol/L   Potassium 3.8 3.5 - 5.1 mmol/L   Chloride 107 101 - 111 mmol/L   CO2 21 (L) 22 - 32 mmol/L   Glucose, Bld 100 (H) 65 - 99 mg/dL   BUN 22 (H) 6 - 20 mg/dL   Creatinine, Ser 1.05 (H) 0.44 - 1.00 mg/dL   Calcium 9.3 8.9 - 10.3 mg/dL   Total Protein 7.8 6.5 - 8.1 g/dL   Albumin 4.4 3.5 - 5.0 g/dL   AST 25 15 - 41 U/L   ALT 25 14 - 54 U/L   Alkaline Phosphatase 81 38 - 126 U/L   Total Bilirubin 1.2 0.3 - 1.2 mg/dL   GFR calc non Af Amer 48 (L) >60 mL/min   GFR calc Af Amer 56 (L) >60 mL/min   Anion gap 10 5 - 15  Troponin I  Result Value Ref Range   Troponin I <0.03 <0.03 ng/mL  CBC with Differential  Result Value Ref Range   WBC 8.7 4.0 - 10.5 K/uL   RBC 4.46 3.87 - 5.11 MIL/uL   Hemoglobin 13.8 12.0 - 15.0 g/dL   HCT 40.7 36.0 - 46.0 %   MCV  91.3 78.0 - 100.0 fL   MCH 30.9 26.0 - 34.0 pg   MCHC 33.9 30.0 - 36.0 g/dL   RDW 13.1 11.5 - 15.5 %   Platelets 337 150 - 400 K/uL   Neutrophils Relative % 58 %   Neutro Abs 5.2 1.7 - 7.7 K/uL   Lymphocytes Relative 30 %   Lymphs Abs 2.6 0.7 - 4.0 K/uL   Monocytes Relative 10 %   Monocytes Absolute 0.9 0.1 - 1.0 K/uL   Eosinophils Relative 1 %   Eosinophils Absolute 0.1 0.0 - 0.7 K/uL   Basophils Relative 1 %   Basophils Absolute 0.1 0.0 - 0.1  K/uL  CBG monitoring, ED  Result Value Ref Range   Glucose-Capillary 107 (H) 65 - 99 mg/dL   Dg Chest 2 View Result Date: 11/03/2016 CLINICAL DATA:  Cough for 1 year.  Syncope today. EXAM: CHEST  2 VIEW COMPARISON:  PA and lateral chest 03/03/2013. FINDINGS: The lungs are clear. Heart size is normal. No pneumothorax or pleural effusion. No bony abnormality. IMPRESSION: Negative chest. Electronically Signed   By: Inge Rise M.D.   On: 11/03/2016 17:09   Ct Head Wo Contrast Result Date: 11/03/2016 CLINICAL DATA:  83 year old female with fall, syncope and head face and neck injury with pain. EXAM: CT HEAD WITHOUT CONTRAST CT MAXILLOFACIAL WITHOUT CONTRAST CT CERVICAL SPINE WITHOUT CONTRAST TECHNIQUE: Multidetector CT imaging of the head, cervical spine, and maxillofacial structures were performed using the standard protocol without intravenous contrast. Multiplanar CT image reconstructions of the cervical spine and maxillofacial structures were also generated. COMPARISON:  08/10/2006 head CT FINDINGS: CT HEAD FINDINGS Brain: No evidence of acute infarction, hemorrhage, hydrocephalus, extra-axial collection or mass lesion/mass effect. Atrophy and moderate chronic small-vessel white matter ischemic changes are again noted. Vascular: Intracranial atherosclerotic calcifications noted. Skull: Normal. Negative for fracture or focal lesion. Other: None. CT MAXILLOFACIAL FINDINGS Osseous: No fracture or mandibular dislocation. No destructive process. Orbits: Negative. No traumatic or inflammatory finding. Sinuses: Clear. Soft tissues: Soft tissue swelling overlying the chin noted. CT CERVICAL SPINE FINDINGS Alignment: Normal. Skull base and vertebrae: No acute fracture. No primary bone lesion or focal pathologic process. Soft tissues and spinal canal: No prevertebral fluid or swelling. No visible canal hematoma. Disc levels: Unremarkable except for mild multilevel facet arthropathy Upper chest: Negative. Other:  None IMPRESSION: No evidence of acute intracranial abnormality. Atrophy and chronic small-vessel white matter ischemic changes. Mild facial soft tissue swelling without acute facial fracture. No static evidence of acute injury to the cervical spine. Electronically Signed   By: Margarette Canada M.D.   On: 11/03/2016 17:38   Ct Cervical Spine Wo Contrast Result Date: 11/03/2016 CLINICAL DATA:  83 year old female with fall, syncope and head face and neck injury with pain. EXAM: CT HEAD WITHOUT CONTRAST CT MAXILLOFACIAL WITHOUT CONTRAST CT CERVICAL SPINE WITHOUT CONTRAST TECHNIQUE: Multidetector CT imaging of the head, cervical spine, and maxillofacial structures were performed using the standard protocol without intravenous contrast. Multiplanar CT image reconstructions of the cervical spine and maxillofacial structures were also generated. COMPARISON:  08/10/2006 head CT FINDINGS: CT HEAD FINDINGS Brain: No evidence of acute infarction, hemorrhage, hydrocephalus, extra-axial collection or mass lesion/mass effect. Atrophy and moderate chronic small-vessel white matter ischemic changes are again noted. Vascular: Intracranial atherosclerotic calcifications noted. Skull: Normal. Negative for fracture or focal lesion. Other: None. CT MAXILLOFACIAL FINDINGS Osseous: No fracture or mandibular dislocation. No destructive process. Orbits: Negative. No traumatic or inflammatory finding. Sinuses: Clear. Soft tissues: Soft tissue swelling  overlying the chin noted. CT CERVICAL SPINE FINDINGS Alignment: Normal. Skull base and vertebrae: No acute fracture. No primary bone lesion or focal pathologic process. Soft tissues and spinal canal: No prevertebral fluid or swelling. No visible canal hematoma. Disc levels: Unremarkable except for mild multilevel facet arthropathy Upper chest: Negative. Other: None IMPRESSION: No evidence of acute intracranial abnormality. Atrophy and chronic small-vessel white matter ischemic changes. Mild facial  soft tissue swelling without acute facial fracture. No static evidence of acute injury to the cervical spine. Electronically Signed   By: Margarette Canada M.D.   On: 11/03/2016 17:38   Ct Maxillofacial Wo Cm Result Date: 11/03/2016 CLINICAL DATA:  83 year old female with fall, syncope and head face and neck injury with pain. EXAM: CT HEAD WITHOUT CONTRAST CT MAXILLOFACIAL WITHOUT CONTRAST CT CERVICAL SPINE WITHOUT CONTRAST TECHNIQUE: Multidetector CT imaging of the head, cervical spine, and maxillofacial structures were performed using the standard protocol without intravenous contrast. Multiplanar CT image reconstructions of the cervical spine and maxillofacial structures were also generated. COMPARISON:  08/10/2006 head CT FINDINGS: CT HEAD FINDINGS Brain: No evidence of acute infarction, hemorrhage, hydrocephalus, extra-axial collection or mass lesion/mass effect. Atrophy and moderate chronic small-vessel white matter ischemic changes are again noted. Vascular: Intracranial atherosclerotic calcifications noted. Skull: Normal. Negative for fracture or focal lesion. Other: None. CT MAXILLOFACIAL FINDINGS Osseous: No fracture or mandibular dislocation. No destructive process. Orbits: Negative. No traumatic or inflammatory finding. Sinuses: Clear. Soft tissues: Soft tissue swelling overlying the chin noted. CT CERVICAL SPINE FINDINGS Alignment: Normal. Skull base and vertebrae: No acute fracture. No primary bone lesion or focal pathologic process. Soft tissues and spinal canal: No prevertebral fluid or swelling. No visible canal hematoma. Disc levels: Unremarkable except for mild multilevel facet arthropathy Upper chest: Negative. Other: None IMPRESSION: No evidence of acute intracranial abnormality. Atrophy and chronic small-vessel white matter ischemic changes. Mild facial soft tissue swelling without acute facial fracture. No static evidence of acute injury to the cervical spine. Electronically Signed   By: Margarette Canada M.D.   On: 11/03/2016 17:38    1855:  Pt would not stand for orthostatic VS, but did walk to the bathroom with unsteady gait. Continues to deny CP/SOB/abd pain. Concerned that this is pt's 2nd syncopal episode without prodromal symptoms; will need admit for further workup. Dx and testing d/w pt and family.  Questions answered.  Verb understanding, agreeable to admit. T/C to [facility]. Nehemiah Settle, case discussed, including:  HPI, pertinent PM/SHx, VS/PE, dx testing, ED course and treatment:  Agreeable to admit.     Final Clinical Impressions(s) / ED Diagnoses   Final diagnoses:  None    New Prescriptions New Prescriptions   No medications on file      Francine Graven, DO 11/06/16 2134

## 2016-11-03 NOTE — ED Triage Notes (Signed)
Pt reports remembering driving to [facility] to get icecream but does not remember getting into the parking lot or out of the car.  Pt was found face down in the parking lot after arriving there.  Abrasions to face and pt c/o facial pain, but denies other complaints.

## 2016-11-03 NOTE — ED Notes (Signed)
Pt is not able to stand up at this time.

## 2016-11-03 NOTE — H&P (Signed)
History and Physical  [name] KNL:[id number] DOB: 10-04-1932 DOA: 11/03/2016  Referring physician: Dr Thurnell Garbe, ED physician PCP: Asencion Noble, MD  Outpatient Specialists:   [place]  Patient Coming From: home  Chief Complaint: syncope and collapse  HPI: Angela Galvan is a 83 y.o. female with a history of breast cancer status post mastectomy, diet-controlled diabetes, arthritis, GERD, IBS. Patient has had 2 syncopal episodes in the past 6 months. Today the patient was walking to a restaurant he denies cream when she had a single episode and collapse. She does not recall any event and had no prodromal symptoms. He will have to EMS standing over her who brought her to the hospital. She has no confusion, dizziness, lightheadedness. She does have some discomfort and bruising from her face hitting the pavement. She denies drinking pain. This episode occurred with similar event back in December. She did not seek care or have any workup done. Other than these 2 episodes, there had been no further episodes. She otherwise feels fine. No provoking or palliating Factors.   Emergency Department Course: CT of the face show contusions and soft tissue damage, although no osseous problems. Workup otherwise negative.  Review of Systems:   Pt denies any fevers, chills, nausea, vomiting, diarrhea, constipation, abdominal pain, shortness of breath, dyspnea on exertion, orthopnea, cough, wheezing, palpitations, headache, vision changes, lightheadedness, dizziness, melena, rectal bleeding.  Review of systems are otherwise negative  Past Medical History:  Diagnosis Date  . Anxiety   . Arthritis   . Cancer [facility])    breast cancer left with mastectomy  . 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 Depression   . Diabetes mellitus    no meds.-diet controlled  . Diarrhea   . Diverticulitis    s/p colectomy in 1991  . GERD  (gastroesophageal reflux disease)   . HOH (hard of hearing)   . HTN (hypertension)    Does not see a cardiologist  . Hyperlipidemia   . Hypothyroidism   . IBS (irritable bowel syndrome)   . S/P colonoscopy 2003   minimal internal hemorrhoids, pancolonic diverticula, biopsy of rectum: lymphoplasmacytic microscopic colitis, colon biopsies normal  . S/P colonoscopy 2012   pancolonic diverticula, nl TI, external hemorrhoidal tag,/rectum bx no abnormalities  . S/P endoscopy 2012   normal esophagus, stenotic pyloric channel, TTS dilation  . Stroke ([place]) 08/10/2006   no residual   Past Surgical History:  Procedure Laterality Date  . ABDOMINAL HYSTERECTOMY  1983  . bilateral cataracts    . BIOPSY  03/23/2016   Procedure: BIOPSY;  Surgeon: Daneil Dolin, MD;  Location: [facility];  Service: Endoscopy;;  colon   . BREAST BIOPSY Left 01/10/2013   Procedure: BREAST BIOPSY;  Surgeon: Jamesetta So, MD;  Location: [facility];  Service: General;  Laterality: Left;  . BREAST LUMPECTOMY Right 01/29/2015   Procedure: RIGHT BREAST LUMPECTOMY;  Surgeon: Fanny Skates, MD;  Location: [place];  Service: General;  Laterality: Right;  . CHOLECYSTECTOMY    . COLECTOMY    . COLONOSCOPY  09/11/01   Minimal internal hemorrhoids, otherwise normal rectum . Scattered pancolonic diverticula (few) The colonic mucosa otherwise appeared normal status post biopsies and stool sampling as described above.  . COLONOSCOPY N/A 03/23/2016   Procedure: COLONOSCOPY;  Surgeon: Daneil Dolin, MD;  Location: [facility];  Service: Endoscopy;  Laterality: N/A;  1115  . ESOPHAGOGASTRODUODENOSCOPY  10/11/2010   Normal esophagus, small hiatal hernia,Stenotic pyloric channel likely critical.  Likely contributing to some of the patient's symptoms, status post TTS balloon dilation Normal D1-D3 status post biopsy.  D2-D3 atrophic-appearing gastric mucosa status post biopsy  . ileocolonoscopy  10/11/2010   external  hemorrhoidal tag.  Normal rectum status post biopsy Pancolonic diverticula status post segmental biopsy.  Normal  terminal ileum  . JOINT REPLACEMENT Bilateral   . KNEE ARTHROPLASTY Right 2002/09/01  . KNEE SURGERY Bilateral    09/01/02- [facility]- total; knee  . right ovarian tumor removed as teenager    . SIMPLE MASTECTOMY WITH AXILLARY SENTINEL NODE BIOPSY Left 03/07/2013   Procedure: TOTAL  MASTECTOMY WITH AXILLARY SENTINEL NODE BIOPSY;  Surgeon: Adin Hector, MD;  Location: [place];  Service: General;  Laterality: Left;  . TOTAL KNEE ARTHROPLASTY  09/25/2011   Procedure: TOTAL KNEE ARTHROPLASTY;  Surgeon: Gearlean Alf, MD;  Location: [facility];  Service: Orthopedics;  Laterality: Left;   Social History:  reports that she has never smoked. She has never used smokeless tobacco. She reports that she drinks alcohol. She reports that she does not use drugs. Patient lives at Home  No Known Allergies  Family History  Problem Relation Age of Onset  . Stroke Mother        deceased after 3 strokes 01-Sep-1999  . Diabetes Father   . Stroke Maternal Grandmother        deceased after 3 strokes  . Stroke Sister        twin sister  . Colon cancer Neg Hx       Prior to Admission medications   Medication Sig Start Date End Date Taking? Authorizing Provider  acetaminophen (TYLENOL) 500 MG tablet Take 500-1,000 mg by mouth every 6 (six) hours as needed. Pain    Yes [provider]  ALPRAZolam (XANAX) 0.5 MG tablet Take 0.5 mg by mouth 2 (two) times daily as needed for sleep or anxiety.   Yes [provider]  aspirin 325 MG tablet Take 325 mg by mouth daily.   Yes [provider]  atorvastatin (LIPITOR) 10 MG tablet Take 10 mg by mouth daily after breakfast.   Yes [provider]  cholestyramine (QUESTRAN) 4 g packet Take 1 packet by mouth at bedtime.  04/18/16  Yes [provider]  clidinium-chlordiazePOXIDE (LIBRAX) 5-2.5 MG capsule TAKE 1 CAPSULE BY MOUTH FOUR TIMES  DAILY BEFORE MEALS AND AT BEDTIME AS NEEDED. 08/24/16  Yes Carlis Stable, NP  FLUoxetine (PROZAC) 20 MG capsule Take 20 mg by mouth daily.   Yes [provider]  levothyroxine (SYNTHROID, LEVOTHROID) 50 MCG tablet Take 50 mcg by mouth daily before breakfast.    Yes [provider]  loperamide (IMODIUM) 2 MG capsule Take 2 mg by mouth as needed for diarrhea or loose stools.   Yes [provider]  losartan (COZAAR) 100 MG tablet Take 100 mg by mouth daily.   Yes [provider]  Probiotic Product (ALIGN PO) Take 1 tablet by mouth daily.    Yes [provider]  RABEprazole (ACIPHEX) 20 MG tablet Take 1 tablet (20 mg total) by mouth daily. 01/05/16  Yes Walden Field A, NP  RESTASIS 0.05 % ophthalmic emulsion Place 1 drop into both eyes every 12 (twelve) hours.  01/11/11  Yes [provider]  topiramate (TOPAMAX) 25 MG tablet Take 25 mg by mouth daily.    Yes [provider]  UNABLE TO FIND Rx: [id number]- Post Surgical Bras (Quantity: 6) [id number]- Silicone Breast Prosthesis (Quantity: 1) Dx: 174.9; Left Mastectomy 05/01/13   Alphonsa Overall, MD  UNABLE TO FIND Rx: [phone]- Mastectomy Form, left (Quantity: 1) Dx: 174.9; Left mastectomy 07/01/13   Fanny Skates, MD    Physical Exam: BP 139/71   Pulse 68   Temp 98.1 F (36.7 C) (Oral)   Resp 13   Ht 5' (1.524 m)   Wt 48.1 kg (106 lb)   SpO2 100%   BMI 20.70 kg/m   General: Elderly Caucasian female. Awake and alert and oriented x3. No acute cardiopulmonary distress.  HEENT: Normocephalic. Contusions and abrasions on nose and chin. There is significant swelling of the nose and bruising.  Right and left ears normal in appearance.  Pupils equal, round, reactive to light. Extraocular muscles are intact. Sclerae anicteric and noninjected.  Moist mucosal membranes. No mucosal lesions.  Neck: Neck supple without lymphadenopathy. No carotid bruits. No masses palpated.  Cardiovascular: Regular rate with normal  S1-S2 sounds. No murmurs, rubs, gallops auscultated. No JVD.  Respiratory: Good respiratory effort with no wheezes, rales, rhonchi. Lungs clear to auscultation bilaterally.  No accessory muscle use. Abdomen: Soft, nontender, nondistended. Active bowel sounds. No masses or hepatosplenomegaly  Skin: No rashes, lesions, or ulcerations.  Dry, warm to touch. 2+ dorsalis pedis and radial pulses. Musculoskeletal: No calf or leg pain. All major joints not erythematous nontender.  No upper or lower joint deformation.  Good ROM.  No contractures  Psychiatric: Intact judgment and insight. Pleasant and cooperative. Neurologic: No focal neurological deficits. Strength is 5/5 and symmetric in upper and lower extremities.  Cranial nerves II through XII are grossly intact.           Labs on Admission: I have personally reviewed following labs and imaging studies  CBC:  Recent Labs Lab 11/03/16 1635  WBC 8.7  NEUTROABS 5.2  HGB 13.8  HCT 40.7  MCV 91.3  PLT 017   Basic Metabolic Panel:  Recent Labs Lab 11/03/16 1635  NA 138  K 3.8  CL 107  CO2 21*  GLUCOSE 100*  BUN 22*  CREATININE 1.05*  CALCIUM 9.3   GFR: Estimated Creatinine Clearance: 29.7 mL/min (A) (by C-G formula based on SCr of 1.05 mg/dL (H)). Liver Function Tests:  Recent Labs Lab 11/03/16 1635  AST 25  ALT 25  ALKPHOS 81  BILITOT 1.2  PROT 7.8  ALBUMIN 4.4   No results for input(s): LIPASE, AMYLASE in the last 168 hours. No results for input(s): AMMONIA in the last 168 hours. Coagulation Profile: No results for input(s): INR, PROTIME in the last 168 hours. Cardiac Enzymes:  Recent Labs Lab 11/03/16 1635  TROPONINI <0.03   BNP (last 3 results) No results for input(s): PROBNP in the last 8760 hours. HbA1C: No results for input(s): HGBA1C in the last 72 hours. CBG:  Recent Labs Lab 11/03/16 1554  GLUCAP 107*   Lipid Profile: No results for input(s): CHOL, HDL, LDLCALC, TRIG, CHOLHDL, LDLDIRECT in the  last 72 hours. Thyroid Function Tests: No results for input(s): TSH, T4TOTAL, FREET4, T3FREE, THYROIDAB in the last 72 hours. Anemia Panel: No results for input(s): VITAMINB12, FOLATE, FERRITIN, TIBC, IRON, RETICCTPCT in the last 72 hours. Urine analysis:    Component Value Date/Time   COLORURINE YELLOW 11/03/2016 1800   APPEARANCEUR CLEAR 11/03/2016 1800   LABSPEC 1.012 11/03/2016 1800   PHURINE 5.0 11/03/2016 1800   GLUCOSEU NEGATIVE 11/03/2016 1800   HGBUR  NEGATIVE 11/03/2016 1800   BILIRUBINUR NEGATIVE 11/03/2016 1800   KETONESUR NEGATIVE 11/03/2016 1800   PROTEINUR NEGATIVE 11/03/2016 1800   UROBILINOGEN 0.2 09/18/2011 1544   NITRITE NEGATIVE 11/03/2016 1800   LEUKOCYTESUR NEGATIVE 11/03/2016 1800   Sepsis Labs: @LABRCNTIP (procalcitonin:4,lacticidven:4) )No results found for this or any previous visit (from the past 240 hour(s)).   Radiological Exams on Admission: Dg Chest 2 View  Result Date: 11/03/2016 CLINICAL DATA:  Cough for 1 year.  Syncope today. EXAM: CHEST  2 VIEW COMPARISON:  PA and lateral chest 03/03/2013. FINDINGS: The lungs are clear. Heart size is normal. No pneumothorax or pleural effusion. No bony abnormality. IMPRESSION: Negative chest. Electronically Signed   By: Inge Rise M.D.   On: 11/03/2016 17:09   Ct Head Wo Contrast  Result Date: 11/03/2016 CLINICAL DATA:  83 year old female with fall, syncope and head face and neck injury with pain. EXAM: CT HEAD WITHOUT CONTRAST CT MAXILLOFACIAL WITHOUT CONTRAST CT CERVICAL SPINE WITHOUT CONTRAST TECHNIQUE: Multidetector CT imaging of the head, cervical spine, and maxillofacial structures were performed using the standard protocol without intravenous contrast. Multiplanar CT image reconstructions of the cervical spine and maxillofacial structures were also generated. COMPARISON:  08/10/2006 head CT FINDINGS: CT HEAD FINDINGS Brain: No evidence of acute infarction, hemorrhage, hydrocephalus, extra-axial collection  or mass lesion/mass effect. Atrophy and moderate chronic small-vessel white matter ischemic changes are again noted. Vascular: Intracranial atherosclerotic calcifications noted. Skull: Normal. Negative for fracture or focal lesion. Other: None. CT MAXILLOFACIAL FINDINGS Osseous: No fracture or mandibular dislocation. No destructive process. Orbits: Negative. No traumatic or inflammatory finding. Sinuses: Clear. Soft tissues: Soft tissue swelling overlying the chin noted. CT CERVICAL SPINE FINDINGS Alignment: Normal. Skull base and vertebrae: No acute fracture. No primary bone lesion or focal pathologic process. Soft tissues and spinal canal: No prevertebral fluid or swelling. No visible canal hematoma. Disc levels: Unremarkable except for mild multilevel facet arthropathy Upper chest: Negative. Other: None IMPRESSION: No evidence of acute intracranial abnormality. Atrophy and chronic small-vessel white matter ischemic changes. Mild facial soft tissue swelling without acute facial fracture. No static evidence of acute injury to the cervical spine. Electronically Signed   By: Margarette Canada M.D.   On: 11/03/2016 17:38   Ct Cervical Spine Wo Contrast  Result Date: 11/03/2016 CLINICAL DATA:  83 year old female with fall, syncope and head face and neck injury with pain. EXAM: CT HEAD WITHOUT CONTRAST CT MAXILLOFACIAL WITHOUT CONTRAST CT CERVICAL SPINE WITHOUT CONTRAST TECHNIQUE: Multidetector CT imaging of the head, cervical spine, and maxillofacial structures were performed using the standard protocol without intravenous contrast. Multiplanar CT image reconstructions of the cervical spine and maxillofacial structures were also generated. COMPARISON:  08/10/2006 head CT FINDINGS: CT HEAD FINDINGS Brain: No evidence of acute infarction, hemorrhage, hydrocephalus, extra-axial collection or mass lesion/mass effect. Atrophy and moderate chronic small-vessel white matter ischemic changes are again noted. Vascular:  Intracranial atherosclerotic calcifications noted. Skull: Normal. Negative for fracture or focal lesion. Other: None. CT MAXILLOFACIAL FINDINGS Osseous: No fracture or mandibular dislocation. No destructive process. Orbits: Negative. No traumatic or inflammatory finding. Sinuses: Clear. Soft tissues: Soft tissue swelling overlying the chin noted. CT CERVICAL SPINE FINDINGS Alignment: Normal. Skull base and vertebrae: No acute fracture. No primary bone lesion or focal pathologic process. Soft tissues and spinal canal: No prevertebral fluid or swelling. No visible canal hematoma. Disc levels: Unremarkable except for mild multilevel facet arthropathy Upper chest: Negative. Other: None IMPRESSION: No evidence of acute intracranial abnormality. Atrophy and chronic small-vessel white matter  ischemic changes. Mild facial soft tissue swelling without acute facial fracture. No static evidence of acute injury to the cervical spine. Electronically Signed   By: Margarette Canada M.D.   On: 11/03/2016 17:38   Ct Maxillofacial Wo Cm  Result Date: 11/03/2016 CLINICAL DATA:  83 year old female with fall, syncope and head face and neck injury with pain. EXAM: CT HEAD WITHOUT CONTRAST CT MAXILLOFACIAL WITHOUT CONTRAST CT CERVICAL SPINE WITHOUT CONTRAST TECHNIQUE: Multidetector CT imaging of the head, cervical spine, and maxillofacial structures were performed using the standard protocol without intravenous contrast. Multiplanar CT image reconstructions of the cervical spine and maxillofacial structures were also generated. COMPARISON:  08/10/2006 head CT FINDINGS: CT HEAD FINDINGS Brain: No evidence of acute infarction, hemorrhage, hydrocephalus, extra-axial collection or mass lesion/mass effect. Atrophy and moderate chronic small-vessel white matter ischemic changes are again noted. Vascular: Intracranial atherosclerotic calcifications noted. Skull: Normal. Negative for fracture or focal lesion. Other: None. CT MAXILLOFACIAL FINDINGS  Osseous: No fracture or mandibular dislocation. No destructive process. Orbits: Negative. No traumatic or inflammatory finding. Sinuses: Clear. Soft tissues: Soft tissue swelling overlying the chin noted. CT CERVICAL SPINE FINDINGS Alignment: Normal. Skull base and vertebrae: No acute fracture. No primary bone lesion or focal pathologic process. Soft tissues and spinal canal: No prevertebral fluid or swelling. No visible canal hematoma. Disc levels: Unremarkable except for mild multilevel facet arthropathy Upper chest: Negative. Other: None IMPRESSION: No evidence of acute intracranial abnormality. Atrophy and chronic small-vessel white matter ischemic changes. Mild facial soft tissue swelling without acute facial fracture. No static evidence of acute injury to the cervical spine. Electronically Signed   By: Margarette Canada M.D.   On: 11/03/2016 17:38    EKG: Independently reviewed. Sinus rhythm with R, R prime in V1 and V2. No acute ST changes.  Assessment/Plan: Principal Problem:   Syncope and collapse Active Problems:   GERD (gastroesophageal reflux disease)   IBS (irritable bowel syndrome)   Type 2 diabetes mellitus without complication (HCC)   Facial contusion, initial encounter   Facial abrasion, initial encounter    This patient was discussed with the ED physician, including pertinent vitals, physical exam findings, labs, and imaging.  We also discussed care given by the ED provider.  #1 syncope and collapse  Observation  Telemetry monitoring  Echocardiogram tomorrow  No focal deficits, so I don't feel that an MRI is warranted at this point  Rule out hypoglycemia - hemoglobin A1c, CBGs before meals and daily at bedtime #2 type 2 diabetes  Diet-controlled #3 facial contusion and abrasion  Symptomatic treatment #4 IBS  Home medications #5 GERD  Home medications  DVT prophylaxis: SCDs as patient is on risk Consultants: None Code Status: Full code Family Communication:  Husband and daughter in the room  Disposition Plan: Observation   Jacob Moores [facility] Pager [phone]  If 7PM-7AM, please contact night-coverage [email or web address] Password [email or web address]

## 2016-11-03 NOTE — ED Notes (Signed)
Patient ambulated to bathroom.

## 2016-11-04 ENCOUNTER — Observation Stay (HOSPITAL_BASED_OUTPATIENT_CLINIC_OR_DEPARTMENT_OTHER): Payer: Medicare Other

## 2016-11-04 DIAGNOSIS — I351 Nonrheumatic aortic (valve) insufficiency: Secondary | ICD-10-CM

## 2016-11-04 LAB — CBC
HEMATOCRIT: 38.9 % (ref 36.0–46.0)
HEMOGLOBIN: 13.2 g/dL (ref 12.0–15.0)
MCH: 30.8 pg (ref 26.0–34.0)
MCHC: 33.9 g/dL (ref 30.0–36.0)
MCV: 90.7 fL (ref 78.0–100.0)
Platelets: 328 10*3/uL (ref 150–400)
RBC: 4.29 MIL/uL (ref 3.87–5.11)
RDW: 13 % (ref 11.5–15.5)
WBC: 9.3 10*3/uL (ref 4.0–10.5)

## 2016-11-04 LAB — BASIC METABOLIC PANEL
Anion gap: 8 (ref 5–15)
BUN: 18 mg/dL (ref 6–20)
CALCIUM: 9.2 mg/dL (ref 8.9–10.3)
CHLORIDE: 107 mmol/L (ref 101–111)
CO2: 22 mmol/L (ref 22–32)
CREATININE: 1.01 mg/dL — AB (ref 0.44–1.00)
GFR calc non Af Amer: 50 mL/min — ABNORMAL LOW (ref >60)
GFR, EST AFRICAN AMERICAN: 58 mL/min — AB (ref >60)
GLUCOSE: 108 mg/dL — AB (ref 65–99)
Potassium: 3.9 mmol/L (ref 3.5–5.1)
Sodium: 137 mmol/L (ref 135–145)

## 2016-11-04 LAB — ECHOCARDIOGRAM COMPLETE
CHL CUP STROKE VOLUME: 23 mL
CHL CUP TV REG PEAK VELOCITY: 159 cm/s
E decel time: 169 msec
EERAT: 11.21
FS: 29 % (ref 28–44)
HEIGHTINCHES: 60 in
IV/PV OW: 1.07
LA diam end sys: 32 mm
LA diam index: 2.24 cm/m2
LA vol A4C: 33.4 ml
LA vol: 29.7 mL
LASIZE: 32 mm
LAVOLIN: 20.8 mL/m2
LDCA: 1.77 cm2
LV TDI E'LATERAL: 6.42
LV sys vol: 11 mL — AB
LVDIAVOL: 34 mL — AB (ref 46–106)
LVDIAVOLIN: 24 mL/m2
LVEEAVG: 11.21
LVEEMED: 11.21
LVELAT: 6.42 cm/s
LVOT VTI: 24.4 cm
LVOT diameter: 15 mm
LVOT peak grad rest: 4 mmHg
LVOTPV: 98.6 cm/s
LVOTSV: 43 mL
LVSYSVOLIN: 8 mL/m2
MV Dec: 169
MV pk A vel: 99 m/s
MV pk E vel: 72 m/s
MVPG: 2 mmHg
P 1/2 time: 626 ms
PW: 8.22 mm — AB (ref 0.6–1.1)
RV LATERAL S' VELOCITY: 11 cm/s
RV TAPSE: 17.5 mm
RV sys press: 13 mmHg
Simpson's disk: 68
TDI e' medial: 5.66
TR max vel: 159 cm/s
WEIGHTICAEL: 1697.6 [oz_av]

## 2016-11-04 LAB — TSH: TSH: 4.203 u[IU]/mL (ref 0.350–4.500)

## 2016-11-04 LAB — GLUCOSE, CAPILLARY: Glucose-Capillary: 108 mg/dL — ABNORMAL HIGH (ref 65–99)

## 2016-11-04 NOTE — Progress Notes (Signed)
Pt's IV catheter removed and intact. Pt's IV site clean dry and intact. Discharge instructions including medications and follow up appointments were reviewed and discussed with patient's daughter. All questions were answered and no further questions at this time. Pt's daughter verbalized understanding of discharge instructions. Pt in stable condition and in no acute distress at time of discharge. Pt escorted by nurse tech.

## 2016-11-04 NOTE — Progress Notes (Signed)
*  PRELIMINARY RESULTS* Echocardiogram 2D Echocardiogram has been performed.  Angela Galvan 11/04/2016, 9:33 AM

## 2016-11-04 NOTE — Discharge Summary (Signed)
Physician Discharge Summary  Patient ID: Angela Galvan MRN: [id number] DOB/AGE: August 10, 1932 83 y.o. Primary Care Physician:Fagan, Carloyn Manner, MD Admit date: 11/03/2016 Discharge date: 11/04/2016    Discharge Diagnoses:   Principal Problem:   Syncope and collapse Active Problems:   GERD (gastroesophageal reflux disease)   IBS (irritable bowel syndrome)   Type 2 diabetes mellitus without complication (HCC)   Facial contusion, initial encounter   Facial abrasion, initial encounter   Allergies as of 11/04/2016   No Known Allergies     Medication List    TAKE these medications   acetaminophen 500 MG tablet Commonly known as:  TYLENOL Take 500-1,000 mg by mouth every 6 (six) hours as needed. Pain   ALIGN PO Take 1 tablet by mouth daily.   ALPRAZolam 0.5 MG tablet Commonly known as:  XANAX Take 0.5 mg by mouth 2 (two) times daily as needed for sleep or anxiety.   aspirin 325 MG tablet Take 325 mg by mouth daily.   atorvastatin 10 MG tablet Commonly known as:  LIPITOR Take 10 mg by mouth daily after breakfast.   cholestyramine 4 g packet Commonly known as:  QUESTRAN Take 1 packet by mouth at bedtime.   clidinium-chlordiazePOXIDE 5-2.5 MG capsule Commonly known as:  LIBRAX TAKE 1 CAPSULE BY MOUTH FOUR TIMES DAILY BEFORE MEALS AND AT BEDTIME AS NEEDED.   FLUoxetine 20 MG capsule Commonly known as:  PROZAC Take 20 mg by mouth daily.   levothyroxine 50 MCG tablet Commonly known as:  SYNTHROID, LEVOTHROID Take 50 mcg by mouth daily before breakfast.   loperamide 2 MG capsule Commonly known as:  IMODIUM Take 2 mg by mouth as needed for diarrhea or loose stools.   losartan 100 MG tablet Commonly known as:  COZAAR Take 100 mg by mouth daily.   RABEprazole 20 MG tablet Commonly known as:  ACIPHEX Take 1 tablet (20 mg total) by mouth daily.   RESTASIS 0.05 % ophthalmic emulsion Generic drug:  cycloSPORINE Place 1 drop into both eyes every 12 (twelve) hours.   topiramate  25 MG tablet Commonly known as:  TOPAMAX Take 25 mg by mouth daily.   UNABLE TO FIND Rx: L8000- Post Surgical Bras (Quantity: 6) W0981- Silicone Breast Prosthesis (Quantity: 1) Dx: 174.9; Left Mastectomy   UNABLE TO FIND Rx: [id number]- Mastectomy Form, left (Quantity: 1) Dx: 174.9; Left mastectomy       Discharged Condition: improved    Consults: none  Significant Diagnostic Studies: Dg Chest 2 View  Result Date: 11/03/2016 CLINICAL DATA:  Cough for 1 year.  Syncope today. EXAM: CHEST  2 VIEW COMPARISON:  PA and lateral chest 03/03/2013. FINDINGS: The lungs are clear. Heart size is normal. No pneumothorax or pleural effusion. No bony abnormality. IMPRESSION: Negative chest. Electronically Signed   By: Inge Rise M.D.   On: 11/03/2016 17:09   Ct Head Wo Contrast  Result Date: 11/03/2016 CLINICAL DATA:  83 year old female with fall, syncope and head face and neck injury with pain. EXAM: CT HEAD WITHOUT CONTRAST CT MAXILLOFACIAL WITHOUT CONTRAST CT CERVICAL SPINE WITHOUT CONTRAST TECHNIQUE: Multidetector CT imaging of the head, cervical spine, and maxillofacial structures were performed using the standard protocol without intravenous contrast. Multiplanar CT image reconstructions of the cervical spine and maxillofacial structures were also generated. COMPARISON:  08/10/2006 head CT FINDINGS: CT HEAD FINDINGS Brain: No evidence of acute infarction, hemorrhage, hydrocephalus, extra-axial collection or mass lesion/mass effect. Atrophy and moderate chronic small-vessel white matter ischemic changes are again noted. Vascular: Intracranial  atherosclerotic calcifications noted. Skull: Normal. Negative for fracture or focal lesion. Other: None. CT MAXILLOFACIAL FINDINGS Osseous: No fracture or mandibular dislocation. No destructive process. Orbits: Negative. No traumatic or inflammatory finding. Sinuses: Clear. Soft tissues: Soft tissue swelling overlying the chin noted. CT CERVICAL SPINE FINDINGS  Alignment: Normal. Skull base and vertebrae: No acute fracture. No primary bone lesion or focal pathologic process. Soft tissues and spinal canal: No prevertebral fluid or swelling. No visible canal hematoma. Disc levels: Unremarkable except for mild multilevel facet arthropathy Upper chest: Negative. Other: None IMPRESSION: No evidence of acute intracranial abnormality. Atrophy and chronic small-vessel white matter ischemic changes. Mild facial soft tissue swelling without acute facial fracture. No static evidence of acute injury to the cervical spine. Electronically Signed   By: Margarette Canada M.D.   On: 11/03/2016 17:38   Ct Cervical Spine Wo Contrast  Result Date: 11/03/2016 CLINICAL DATA:  83 year old female with fall, syncope and head face and neck injury with pain. EXAM: CT HEAD WITHOUT CONTRAST CT MAXILLOFACIAL WITHOUT CONTRAST CT CERVICAL SPINE WITHOUT CONTRAST TECHNIQUE: Multidetector CT imaging of the head, cervical spine, and maxillofacial structures were performed using the standard protocol without intravenous contrast. Multiplanar CT image reconstructions of the cervical spine and maxillofacial structures were also generated. COMPARISON:  08/10/2006 head CT FINDINGS: CT HEAD FINDINGS Brain: No evidence of acute infarction, hemorrhage, hydrocephalus, extra-axial collection or mass lesion/mass effect. Atrophy and moderate chronic small-vessel white matter ischemic changes are again noted. Vascular: Intracranial atherosclerotic calcifications noted. Skull: Normal. Negative for fracture or focal lesion. Other: None. CT MAXILLOFACIAL FINDINGS Osseous: No fracture or mandibular dislocation. No destructive process. Orbits: Negative. No traumatic or inflammatory finding. Sinuses: Clear. Soft tissues: Soft tissue swelling overlying the chin noted. CT CERVICAL SPINE FINDINGS Alignment: Normal. Skull base and vertebrae: No acute fracture. No primary bone lesion or focal pathologic process. Soft tissues and  spinal canal: No prevertebral fluid or swelling. No visible canal hematoma. Disc levels: Unremarkable except for mild multilevel facet arthropathy Upper chest: Negative. Other: None IMPRESSION: No evidence of acute intracranial abnormality. Atrophy and chronic small-vessel white matter ischemic changes. Mild facial soft tissue swelling without acute facial fracture. No static evidence of acute injury to the cervical spine. Electronically Signed   By: Margarette Canada M.D.   On: 11/03/2016 17:38   Ct Maxillofacial Wo Cm  Result Date: 11/03/2016 CLINICAL DATA:  83 year old female with fall, syncope and head face and neck injury with pain. EXAM: CT HEAD WITHOUT CONTRAST CT MAXILLOFACIAL WITHOUT CONTRAST CT CERVICAL SPINE WITHOUT CONTRAST TECHNIQUE: Multidetector CT imaging of the head, cervical spine, and maxillofacial structures were performed using the standard protocol without intravenous contrast. Multiplanar CT image reconstructions of the cervical spine and maxillofacial structures were also generated. COMPARISON:  08/10/2006 head CT FINDINGS: CT HEAD FINDINGS Brain: No evidence of acute infarction, hemorrhage, hydrocephalus, extra-axial collection or mass lesion/mass effect. Atrophy and moderate chronic small-vessel white matter ischemic changes are again noted. Vascular: Intracranial atherosclerotic calcifications noted. Skull: Normal. Negative for fracture or focal lesion. Other: None. CT MAXILLOFACIAL FINDINGS Osseous: No fracture or mandibular dislocation. No destructive process. Orbits: Negative. No traumatic or inflammatory finding. Sinuses: Clear. Soft tissues: Soft tissue swelling overlying the chin noted. CT CERVICAL SPINE FINDINGS Alignment: Normal. Skull base and vertebrae: No acute fracture. No primary bone lesion or focal pathologic process. Soft tissues and spinal canal: No prevertebral fluid or swelling. No visible canal hematoma. Disc levels: Unremarkable except for mild multilevel facet  arthropathy Upper chest: Negative. Other: None  IMPRESSION: No evidence of acute intracranial abnormality. Atrophy and chronic small-vessel white matter ischemic changes. Mild facial soft tissue swelling without acute facial fracture. No static evidence of acute injury to the cervical spine. Electronically Signed   By: Margarette Canada M.D.   On: 11/03/2016 17:38    Lab Results: Basic Metabolic Panel:  Recent Labs  11/03/16 1635 11/04/16 0715  NA 138 137  K 3.8 3.9  CL 107 107  CO2 21* 22  GLUCOSE 100* 108*  BUN 22* 18  CREATININE 1.05* 1.01*  CALCIUM 9.3 9.2   Liver Function Tests:  Recent Labs  11/03/16 1635  AST 25  ALT 25  ALKPHOS 81  BILITOT 1.2  PROT 7.8  ALBUMIN 4.4     CBC:  Recent Labs  11/03/16 1635 11/04/16 0715  WBC 8.7 9.3  NEUTROABS 5.2  --   HGB 13.8 13.2  HCT 40.7 38.9  MCV 91.3 90.7  PLT 337 328    No results found for this or any previous visit (from the past 240 hour(s)).   Hospital Course:   This is an 83 years old female with history of multiple medical illnesses who was admitted due to syncopal episode.  She had no seizure or chest pain. She was admitted under telemetry. Her labs and EKG were normal. Patient feels better. She want to go home today and being followed in out patient. Patient is being duscharged on her regular medications to be followed be followed in out patient.  Discharge Exam: Blood pressure (!) 117/54, pulse 83, temperature 98.3 F (36.8 C), temperature source Oral, resp. rate 18, height 5' (1.524 m), weight 48.1 kg (106 lb 1.6 oz), SpO2 100 %.    Disposition:  home      Signed: Xachary Hambly   11/04/2016, 8:27 AM

## 2016-11-05 LAB — URINE CULTURE

## 2016-11-05 LAB — HEMOGLOBIN A1C
Hgb A1c MFr Bld: 5.5 % (ref 4.8–5.6)
MEAN PLASMA GLUCOSE: 111 mg/dL

## 2016-11-22 ENCOUNTER — Other Ambulatory Visit: Payer: Self-pay

## 2016-11-22 MED ORDER — CHOLESTYRAMINE 4 G PO PACK: 1.0000 | PACK | Freq: Every day | ORAL | 3 refills | Status: DC

## 2016-11-22 NOTE — Progress Notes (Signed)
Cardiology Office Note   Date:  11/23/2016   ID:  Angela Galvan, DOB 25-Jun-1932, MRN [id number]  PCP:  Asencion Noble, MD  Cardiologist:   Jenkins Rouge, MD   No chief complaint on file.     History of Present Illness: Angela Galvan is a 83 y.o. female who presents for consultation regarding syncope. Referred by Dr Willey Blade PMH includes HTN, Hypothyroidism, HL, Recent hospitalization for syncope. Walking into [place] and passed out. R/O telemetry NSR no arrhythmia. No seizure activity. CT head reviewed and negative 2 episodes over last 6 months No warning symptoms or prodrome. She has had a cough for a year CXR NAD.  Labs ok not azotemicHct 38.9  TSH 4.2  ECG no acute changes or signs of electrical abnormality No chest pain    Echo done during hospitalization reviewed 11/04/16   Normal EF 60-65%  Mild AR Otherwise normal   First episode in December 2017 she may have tripped in snow at [facility] Was able to walk to care but doesn't remember it 2nd episode she was getting a frosty at [facility] parked the car and next thing she knew  EMS was working on her  No prodrome no chest pain palpitations   Past Medical History:  Diagnosis Date  . Anxiety   . Arthritis   . Cancer [facility])    breast cancer left with mastectomy  . 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 Depression   . Diabetes mellitus    no meds.-diet controlled  . Diarrhea   . Diverticulitis    s/p colectomy in 1991  . GERD (gastroesophageal reflux disease)   . HOH (hard of hearing)   . HTN (hypertension)    Does not see a cardiologist  . Hyperlipidemia   . Hypothyroidism   . IBS (irritable bowel syndrome)   . S/P colonoscopy 2003   minimal internal hemorrhoids, pancolonic diverticula, biopsy of rectum: lymphoplasmacytic microscopic colitis, colon biopsies normal  . S/P colonoscopy 2012   pancolonic diverticula, nl TI, external  hemorrhoidal tag,/rectum bx no abnormalities  . S/P endoscopy 2012   normal esophagus, stenotic pyloric channel, TTS dilation  . Stroke ([place]) 08/10/2006   no residual    Past Surgical History:  Procedure Laterality Date  . ABDOMINAL HYSTERECTOMY  1983  . bilateral cataracts    . BIOPSY  03/23/2016   Procedure: BIOPSY;  Surgeon: Daneil Dolin, MD;  Location[facility];  Service: Endoscopy;;  colon   . BREAST BIOPSY Left 01/10/2013   Procedure: BREAST BIOPSY;  Surgeon: Jamesetta So, MD;  Location: [facility];  Service: General;  Laterality: Left;  . BREAST LUMPECTOMY Right 01/29/2015   Procedure: RIGHT BREAST LUMPECTOMY;  Surgeon: Fanny Skates, MD;  Location: [facility];  Service: General;  Laterality: Right;  . CHOLECYSTECTOMY    . COLECTOMY    . COLONOSCOPY  09/11/01   Minimal internal hemorrhoids, otherwise normal rectum . Scattered pancolonic diverticula (few) The colonic mucosa otherwise appeared normal status post biopsies and stool sampling as described above.  . COLONOSCOPY N/A 03/23/2016   Procedure: COLONOSCOPY;  Surgeon: Daneil Dolin, MD;  [facility];  Service: Endoscopy;  Laterality: N/A;  1115  . ESOPHAGOGASTRODUODENOSCOPY  10/11/2010   Normal esophagus, small hiatal hernia,Stenotic pyloric channel likely critical.  Likely contributing to some of the patient's symptoms, status post TTS balloon  dilation Normal D1-D3 status post biopsy.  D2-D3 atrophic-appearing gastric mucosa status post biopsy  . ileocolonoscopy  10/11/2010   external hemorrhoidal tag.  Normal rectum status post biopsy Pancolonic diverticula status post segmental biopsy.  Normal  terminal ileum  . JOINT REPLACEMENT Bilateral   . KNEE ARTHROPLASTY Right 2004  . KNEE SURGERY Bilateral    2004- [facility]- total; knee  . right ovarian tumor removed as teenager    . SIMPLE MASTECTOMY WITH AXILLARY SENTINEL NODE BIOPSY Left 03/07/2013   Procedure: TOTAL  MASTECTOMY WITH AXILLARY  SENTINEL NODE BIOPSY;  Surgeon: Adin Hector, MD;  Location: [facility];  Service: General;  Laterality: Left;  . TOTAL KNEE ARTHROPLASTY  09/25/2011   Procedure: TOTAL KNEE ARTHROPLASTY;  Surgeon: Gearlean Alf, MD;  Location: [facility];  Service: Orthopedics;  Laterality: Left;     Current Outpatient Prescriptions  Medication Sig Dispense Refill  . acetaminophen (TYLENOL) 500 MG tablet Take 500-1,000 mg by mouth every 6 (six) hours as needed. Pain     . ALPRAZolam (XANAX) 0.5 MG tablet Take 0.5 mg by mouth 2 (two) times daily as needed for sleep or anxiety.    Marland Kitchen aspirin 325 MG tablet Take 325 mg by mouth daily.    Marland Kitchen atorvastatin (LIPITOR) 10 MG tablet Take 10 mg by mouth daily after breakfast.    . cholestyramine (QUESTRAN) 4 g packet Take 1 packet by mouth at bedtime. 60 packet 3  . clidinium-chlordiazePOXIDE (LIBRAX) 5-2.5 MG capsule TAKE 1 CAPSULE BY MOUTH FOUR TIMES DAILY BEFORE MEALS AND AT BEDTIME AS NEEDED. 40 capsule 1  . FLUoxetine (PROZAC) 20 MG capsule Take 20 mg by mouth daily.    Marland Kitchen levothyroxine (SYNTHROID, LEVOTHROID) 50 MCG tablet Take 50 mcg by mouth daily before breakfast.     . loperamide (IMODIUM) 2 MG capsule Take 2 mg by mouth as needed for diarrhea or loose stools.    Marland Kitchen losartan (COZAAR) 100 MG tablet Take 100 mg by mouth daily.    . Probiotic Product (ALIGN PO) Take 1 tablet by mouth daily.     . RABEprazole (ACIPHEX) 20 MG tablet Take 1 tablet (20 mg total) by mouth daily. 90 tablet 3  . RESTASIS 0.05 % ophthalmic emulsion Place 1 drop into both eyes every 12 (twelve) hours.     . topiramate (TOPAMAX) 25 MG tablet Take 25 mg by mouth daily.     Marland Kitchen UNABLE TO FIND Rx: L8000- Post Surgical Bras (Quantity: 6) F6384- Silicone Breast Prosthesis (Quantity: 1) Dx: 174.9; Left Mastectomy 1 each 0  . UNABLE TO FIND Rx: [id number]- Mastectomy Form, left (Quantity: 1) Dx: 174.9; Left mastectomy 1 each 0   No current facility-administered medications for this visit.     Allergies:    Patient has no known allergies.    Social History:  The patient  reports that she has never smoked. She has never used smokeless tobacco. She reports that she drinks alcohol. She reports that she does not use drugs.   Family History:  The patient's family history includes Diabetes in her father; Stroke in her maternal grandmother, mother, and sister.    ROS:  Please see the history of present illness.   Otherwise, review of systems are positive for none.   All other systems are reviewed and negative.    PHYSICAL EXAM: VS:  BP (!) 118/58 (BP Location: Right Arm) Comment (BP Location): NO procedures LEFt arm  Pulse 77   Ht 5' (1.524 m)  Wt 48.5 kg (107 lb)   SpO2 98%   BMI 20.90 kg/m  , BMI Body mass index is 20.9 kg/m. Affect appropriate Healthy:  appears stated age 25: normal Neck supple with no adenopathy JVP normal no bruits no thyromegaly Lungs clear with no wheezing and good diaphragmatic motion Heart:  S1/S2 no murmur, no rub, gallop or click PMI normal Abdomen: benighn, BS positve, no tenderness, no AAA no bruit.  No HSM or HJR Distal pulses intact with no bruits No edema Neuro non-focal Skin warm and dry No muscular weakness    EKG:  11/06/16 SR rate 86 normal intervals nonspecific lateral T wave changes    Recent Labs: 11/03/2016: ALT 25 11/04/2016: BUN 18; Creatinine, Ser 1.01; Hemoglobin 13.2; Platelets 328; Potassium 3.9; Sodium 137; TSH 4.203    Lipid Panel    Component Value Date/Time   CHOL  10/20/2008 1320    175        ATP III CLASSIFICATION:  <200     mg/dL   Desirable  200-239  mg/dL   Borderline High  >=240    mg/dL   High          TRIG 148 10/20/2008 1320   HDL 60 10/20/2008 1320   CHOLHDL 2.9 10/20/2008 1320   VLDL 30 10/20/2008 1320   LDLCALC  10/20/2008 1320    85        Total Cholesterol/HDL:CHD Risk Coronary Heart Disease Risk Table                     Men   Women  1/2 Average Risk   3.4   3.3  Average Risk       5.0   4.4   2 X Average Risk   9.6   7.1  3 X Average Risk  23.4   11.0        Use the calculated Patient Ratio above and the CHD Risk Table to determine the patient's CHD Risk.        ATP III CLASSIFICATION (LDL):  <100     mg/dL   Optimal  100-129  mg/dL   Near or Above                    Optimal  130-159  mg/dL   Borderline  160-189  mg/dL   High  >190     mg/dL   Very High      Wt Readings from Last 3 Encounters:  11/23/16 48.5 kg (107 lb)  11/03/16 48.1 kg (106 lb 1.6 oz)  10/24/16 48.2 kg (106 lb 3.2 oz)      Other studies Reviewed: Additional studies/ records that were reviewed today include: Notes Dr Willey Blade [facility] hospital labs ECG and echo and head CT.    ASSESSMENT AND PLAN:  1.  Syncope does not appear to be cardiac Telemetry and testing in hospital negative No need for stress testing no chest Pain normal EF echo and normal ECG Discussed no driving for 6 months Should f/u with Dr Willey Blade to arrange neurology F/u as petit mal seizures would appear to be more likely  If she has recurrence would refer to EP for loop recorder 2. HTN Well controlled.  Continue current medications and low sodium Dash type diet.   3. GERD low carb diet continue aciphex  4. Thyroid on replacement TSH normal  5. Cholesterol continue questran and Lipitor labs with Dr Willey Blade   Current medicines are reviewed at  length with the patient today.  The patient does not have concerns regarding medicines.  The following changes have been made:  no change  Labs/ tests ordered today include: None  No orders of the defined types were placed in this encounter.    Disposition:   FU with cardiology PRN      Signed, Jenkins Rouge, MD  11/23/2016 2:51 PM    [facility] [place], [place], [place]  [address] Phone: [phone]; Fax: [phone]

## 2016-11-23 ENCOUNTER — Encounter: Payer: Self-pay | Admitting: Cardiovascular Disease

## 2016-11-23 ENCOUNTER — Ambulatory Visit (INDEPENDENT_AMBULATORY_CARE_PROVIDER_SITE_OTHER): Payer: Medicare Other | Admitting: Cardiovascular Disease

## 2016-11-23 VITALS — BP 118/58 | HR 77 | Ht 60.0 in | Wt 107.0 lb

## 2016-11-23 DIAGNOSIS — R55 Syncope and collapse: Secondary | ICD-10-CM | POA: Diagnosis not present

## 2016-11-23 NOTE — Patient Instructions (Signed)
Your physician recommends that you schedule a follow-up appointment in:  As needed     No testing or lab work today        Thank you for choosing [facility] !

## 2016-11-30 ENCOUNTER — Encounter: Payer: Self-pay | Admitting: Internal Medicine

## 2016-12-14 ENCOUNTER — Encounter: Payer: Self-pay | Admitting: Neurology

## 2016-12-14 ENCOUNTER — Ambulatory Visit (INDEPENDENT_AMBULATORY_CARE_PROVIDER_SITE_OTHER): Payer: Medicare Other | Admitting: Neurology

## 2016-12-14 VITALS — BP 120/69 | HR 68 | Ht 60.0 in | Wt 107.8 lb

## 2016-12-14 DIAGNOSIS — R55 Syncope and collapse: Secondary | ICD-10-CM

## 2016-12-14 NOTE — Patient Instructions (Signed)
I had a long discussion with patient and her husband regarding her 2 episodes of brief loss of consciousness which likely represent syncopal events without any obvious triggers. Complex partial seizures or vertebral basilar TIAs seem less likely but will need evaluation. Check EEG, MRI scan of the brain with MRA of the brain and neck and 30 day external heart monitor her cardiac arrhythmias. I have advised the patient not to drive as per [facility] law for 4-6 months. She will return for follow-up with me in 2 months or call earlier if necessary.   Syncope Syncope is when you temporarily lose consciousness. Syncope may also be called fainting or passing out. It is caused by a sudden decrease in blood flow to the brain. Even though most causes of syncope are not dangerous, syncope can be a sign of a serious medical problem. Signs that you may be about to faint include:  Feeling dizzy or light-headed.  Feeling nauseous.  Seeing all white or all black in your field of vision.  Having cold, clammy skin.  If you fainted, get medical help right away.Call your local emergency services (911 in the [place] Do not drive yourself to the hospital. Follow these instructions at home: Pay attention to any changes in your symptoms. Take these actions to help with your condition:  Have someone stay with you until you feel stable.  Do not drive, use machinery, or play sports until your health care provider says it is okay.  Keep all follow-up visits as told by your health care provider. This is important.  If you start to feel like you might faint, lie down right away and raise (elevate) your feet above the level of your heart. Breathe deeply and steadily. Wait until all of the symptoms have passed.  Drink enough fluid to keep your urine clear or pale yellow.  If you are taking blood pressure or heart medicine, get up slowly and take several minutes to sit and then stand. This can reduce  dizziness.  Take over-the-counter and prescription medicines only as told by your health care provider.  Get help right away if:  You have a severe headache.  You have unusual pain in your chest, abdomen, or back.  You are bleeding from your mouth or rectum, or you have black or tarry stool.  You have a very fast or irregular heartbeat (palpitations).  You have pain with breathing.  You faint once or repeatedly.  You have a seizure.  You are confused.  You have trouble walking.  You have severe weakness.  You have vision problems. These symptoms may represent a serious problem that is an emergency. Do not wait to see if your symptoms will go away. Get medical help right away. Call your local emergency services (911 in the [place] Do not drive yourself to the hospital. This information is not intended to replace advice given to you by your health care provider. Make sure you discuss any questions you have with your health care provider. Document Released: 05/15/2005 Document Revised: 10/21/2015 Document Reviewed: 01/27/2015 Elsevier Interactive Patient Education  2017 [employer].
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
## 2016-12-18 ENCOUNTER — Encounter (HOSPITAL_COMMUNITY): Payer: Self-pay

## 2016-12-18 ENCOUNTER — Ambulatory Visit (HOSPITAL_COMMUNITY)
Admission: RE | Admit: 2016-12-18 | Discharge: 2016-12-18 | Disposition: A | Discharge status: Home / Self Care | Payer: Medicare Other | Source: Ambulatory Visit | Attending: Internal Medicine | Admitting: Internal Medicine

## 2016-12-18 DIAGNOSIS — Z1231 Encounter for screening mammogram for malignant neoplasm of breast: Secondary | ICD-10-CM | POA: Diagnosis not present

## 2016-12-22 ENCOUNTER — Other Ambulatory Visit: Payer: Self-pay | Admitting: Nurse Practitioner

## 2017-01-10 ENCOUNTER — Ambulatory Visit (INDEPENDENT_AMBULATORY_CARE_PROVIDER_SITE_OTHER): Payer: Medicare Other

## 2017-01-10 DIAGNOSIS — R55 Syncope and collapse: Secondary | ICD-10-CM
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
## 2017-01-13 ENCOUNTER — Ambulatory Visit: Payer: Medicare Other

## 2017-01-13 DIAGNOSIS — R55 Syncope and collapse: Secondary | ICD-10-CM

## 2017-01-15 ENCOUNTER — Other Ambulatory Visit: Payer: Self-pay

## 2017-01-15 ENCOUNTER — Ambulatory Visit (INDEPENDENT_AMBULATORY_CARE_PROVIDER_SITE_OTHER): Payer: Medicare Other

## 2017-01-15 DIAGNOSIS — R55 Syncope and collapse: Secondary | ICD-10-CM

## 2017-01-16 ENCOUNTER — Telehealth: Payer: Self-pay

## 2017-01-16 NOTE — Telephone Encounter (Signed)
Rn call patient back that the EEG was normal. Pt verbalized understanding.

## 2017-01-16 NOTE — Telephone Encounter (Signed)
-----   Message from Garvin Fila, MD sent at 01/16/2017  4:42 PM EDT ----- Angela Galvan inform the patient that EEG study was normal

## 2017-01-17 ENCOUNTER — Telehealth: Payer: Self-pay | Admitting: Neurology

## 2017-01-17 NOTE — Telephone Encounter (Signed)
Patient schedule

## 2017-01-17 NOTE — Telephone Encounter (Signed)
Patient is scheduled to have the MRI Brain & MRA Head at [facility] on 01/24/17 arrive at 12:45 pm. Then she will have the MRA Neck 01/26/17 arrive at 1:45 pm. Patient is aware of time and day of her appointments.

## 2017-01-24 ENCOUNTER — Ambulatory Visit (HOSPITAL_COMMUNITY)
Admission: RE | Admit: 2017-01-24 | Discharge: 2017-01-24 | Disposition: A | Discharge status: Home / Self Care | Payer: Medicare Other | Source: Ambulatory Visit | Attending: Neurology | Admitting: Neurology

## 2017-01-24 DIAGNOSIS — I672 Cerebral atherosclerosis: Secondary | ICD-10-CM | POA: Diagnosis not present

## 2017-01-24 DIAGNOSIS — G319 Degenerative disease of nervous system, unspecified: Secondary | ICD-10-CM | POA: Diagnosis not present

## 2017-01-24 DIAGNOSIS — R55 Syncope and collapse: Secondary | ICD-10-CM | POA: Insufficient documentation

## 2017-01-24 LAB — POCT I-STAT CREATININE: Creatinine, Ser: 1 mg/dL (ref 0.44–1.00)

## 2017-01-24 MED ORDER — GADOBENATE DIMEGLUMINE 529 MG/ML IV SOLN
10.0000 mL | Freq: Once | INTRAVENOUS | Status: AC | PRN
  Administered 2017-01-24: 9 mL via INTRAVENOUS

## 2017-01-26 ENCOUNTER — Ambulatory Visit (HOSPITAL_COMMUNITY): Payer: Medicare Other

## 2017-01-30 ENCOUNTER — Telehealth: Payer: Self-pay

## 2017-01-30 NOTE — Telephone Encounter (Signed)
LEft vm on patients daughter JErr vm to call back about pts mri of brain, mra head, and neck results.

## 2017-01-30 NOTE — Telephone Encounter (Signed)
RN return pts daughter phone number to call back about all three images results.

## 2017-01-30 NOTE — Telephone Encounter (Signed)
Pt's daughter returned RN's call °

## 2017-01-30 NOTE — Telephone Encounter (Signed)
-----   Message from Garvin Fila, MD sent at 01/26/2017  2:35 PM EDT ----- Mitchell Heir inform the patient that the MRI scan of the brain shows mild changes of age-related shrinkage of the brain and hardening of the arteries but no new or worrisome finding. MRA of the brain and neck both do not show any major blockages that need any attention.

## 2017-01-31 NOTE — Telephone Encounter (Signed)
RN receive call from patients daughter Avie Echevaria. Rn stated the patient that the MRI scan of the brain shows mild changes of age-related shrinkage of the brain and hardening of the arteries but no new or worrisome finding. MRA of the brain and neck both do not show any major blockages that need any attention.Pts daughter verbalized understanding.

## 2017-02-06 ENCOUNTER — Ambulatory Visit: Payer: Medicare Other | Admitting: Internal Medicine

## 2017-02-13 ENCOUNTER — Ambulatory Visit (INDEPENDENT_AMBULATORY_CARE_PROVIDER_SITE_OTHER): Payer: Medicare Other | Admitting: Internal Medicine

## 2017-02-13 ENCOUNTER — Encounter: Payer: Self-pay | Admitting: Internal Medicine

## 2017-02-13 VITALS — BP 119/63 | HR 66 | Temp 97.6°F | Ht 60.0 in | Wt 106.8 lb

## 2017-02-13 DIAGNOSIS — K580 Irritable bowel syndrome with diarrhea: Secondary | ICD-10-CM | POA: Diagnosis not present

## 2017-02-13 DIAGNOSIS — K219 Gastro-esophageal reflux disease without esophagitis: Secondary | ICD-10-CM | POA: Diagnosis not present

## 2017-02-13 NOTE — Progress Notes (Signed)
Primary Care Physician:  Asencion Noble, MD Primary Gastroenterologist:  Dr. Gala Romney  Pre-Procedure History & Physical: HPI:  Angela Galvan is a 83 y.o. female here for follow-up of GERD and IBD-D symptoms. She brings a diary with her. Over the past 5 months she's had 6 days with diarrhea;  Normal bowel function the remainder of the time. Continues on probiotic and Questran 4 g daily. Also takes AcipHex for reflux.  Has had a couple of syncopal/fall episodes which are being worked up.  Past Medical History:  Diagnosis Date  . Anxiety   . Arthritis   . Cancer [facility])    breast cancer left with mastectomy  . 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 Depression   . Diabetes mellitus    no meds.-diet controlled  . Diarrhea   . Diverticulitis    s/p colectomy in 1991  . GERD (gastroesophageal reflux disease)   . HOH (hard of hearing)   . HTN (hypertension)    Does not see a cardiologist  . Hyperlipidemia   . Hypothyroidism   . IBS (irritable bowel syndrome)   . S/P colonoscopy 2003   minimal internal hemorrhoids, pancolonic diverticula, biopsy of rectum: lymphoplasmacytic microscopic colitis, colon biopsies normal  . S/P colonoscopy 2012   pancolonic diverticula, nl TI, external hemorrhoidal tag,/rectum bx no abnormalities  . S/P endoscopy 2012   normal esophagus, stenotic pyloric channel, TTS dilation  . Stroke ([place]) 08/10/2006   no residual    Past Surgical History:  Procedure Laterality Date  . ABDOMINAL HYSTERECTOMY  1983  . bilateral cataracts    . BIOPSY  03/23/2016   Procedure: BIOPSY;  Surgeon: Daneil Dolin, MD;  Location: [facility];  Service: Endoscopy;;  colon   . BREAST BIOPSY Left 01/10/2013   Procedure: BREAST BIOPSY;  Surgeon: Jamesetta So, MD;  Location: [facility];  Service: General;  Laterality: Left;  . BREAST LUMPECTOMY Right 01/29/2015   Procedure: RIGHT BREAST  LUMPECTOMY;  Surgeon: Fanny Skates, MD;  Location: [place];  Service: General;  Laterality: Right;  . CHOLECYSTECTOMY    . COLECTOMY    . COLONOSCOPY  09/11/01   Minimal internal hemorrhoids, otherwise normal rectum . Scattered pancolonic diverticula (few) The colonic mucosa otherwise appeared normal status post biopsies and stool sampling as described above.  . COLONOSCOPY N/A 03/23/2016   Procedure: COLONOSCOPY;  Surgeon: Daneil Dolin, MD;  Location: [facility];  Service: Endoscopy;  Laterality: N/A;  1115  . ESOPHAGOGASTRODUODENOSCOPY  10/11/2010   Normal esophagus, small hiatal hernia,Stenotic pyloric channel likely critical.  Likely contributing to some of the patient's symptoms, status post TTS balloon dilation Normal D1-D3 status post biopsy.  D2-D3 atrophic-appearing gastric mucosa status post biopsy  . ileocolonoscopy  10/11/2010   external hemorrhoidal tag.  Normal rectum status post biopsy Pancolonic diverticula status post segmental biopsy.  Normal  terminal ileum  . JOINT REPLACEMENT Bilateral   . KNEE ARTHROPLASTY Right 2004  . KNEE SURGERY Bilateral    2004- [facility]- total; knee  . right ovarian tumor removed as teenager    . SIMPLE MASTECTOMY WITH AXILLARY SENTINEL NODE BIOPSY Left 03/07/2013   Procedure: TOTAL  MASTECTOMY WITH AXILLARY SENTINEL NODE BIOPSY;  Surgeon: Adin Hector, MD;  Location: [place];  Service: General;  Laterality: Left;  . TOTAL KNEE ARTHROPLASTY  09/25/2011   Procedure: TOTAL KNEE  ARTHROPLASTY;  Surgeon: Gearlean Alf, MD;  Location: [facility];  Service: Orthopedics;  Laterality: Left;    Prior to Admission medications   Medication Sig Start Date End Date Taking? Authorizing Provider  acetaminophen (TYLENOL) 500 MG tablet Take 500-1,000 mg by mouth every 6 (six) hours as needed. Pain    Yes [provider]  ALPRAZolam (XANAX) 0.5 MG tablet Take 0.5 mg by mouth 2 (two) times daily as needed for sleep or anxiety.   Yes  [provider]  aspirin 325 MG tablet Take 325 mg by mouth daily.   Yes [provider]  atorvastatin (LIPITOR) 10 MG tablet Take 10 mg by mouth daily after breakfast.   Yes [provider]  cholestyramine (QUESTRAN) 4 g packet Take 1 packet by mouth at bedtime. 11/22/16  Yes Annitta Needs, NP  clidinium-chlordiazePOXIDE (LIBRAX) 5-2.5 MG capsule TAKE 1 CAPSULE BY MOUTH FOUR TIMES DAILY BEFORE MEALS AND AT BEDTIME AS NEEDED. September 01, 2016  Yes Carlis Stable, NP  FLUoxetine (PROZAC) 20 MG capsule Take 20 mg by mouth daily.   Yes [provider]  levothyroxine (SYNTHROID, LEVOTHROID) 50 MCG tablet Take 50 mcg by mouth daily before breakfast.    Yes [provider]  loperamide (IMODIUM) 2 MG capsule Take 2 mg by mouth as needed for diarrhea or loose stools.   Yes [provider]  losartan (COZAAR) 100 MG tablet Take 100 mg by mouth daily.   Yes [provider]  Probiotic Product (ALIGN PO) Take 1 tablet by mouth daily.    Yes [provider]  RABEprazole (ACIPHEX) 20 MG tablet TAKE 1 TABLET DAILY 12/22/16  Yes Annitta Needs, NP  RESTASIS 0.05 % ophthalmic emulsion Place 1 drop into both eyes every 12 (twelve) hours.  01/11/11  Yes [provider]  topiramate (TOPAMAX) 25 MG tablet Take 25 mg by mouth daily.    Yes [provider]  UNABLE TO FIND Rx: [place] (Quantity: 6) G9211- Silicone Breast Prosthesis (Quantity: 1) Dx: 174.9; Left Mastectomy 05/01/13  Yes Alphonsa Overall, MD  UNABLE TO FIND Rx: [phone]- Mastectomy Form, left (Quantity: 1) Dx: 174.9; Left mastectomy 07/01/13  Yes Fanny Skates, MD    Allergies as of 02/13/2017  . (No Known Allergies)    Family History  Problem Relation Age of Onset  . Stroke Mother        deceased after 3 strokes Sep 02, 1999  . Diabetes Father   . Stroke Maternal Grandmother        deceased after 3 strokes  . Stroke Sister        twin sister  . Colon cancer Neg Hx       Social History   Social History  . Marital status: Married    Spouse name: N/A  . Number of children: N/A  . Years of education: N/A   Occupational History  . retired Retired   Social History Main Topics  . Smoking status: Never Smoker  . Smokeless tobacco: Never Used     Comment: Never smoked  . Alcohol use 0.6 oz/week    1 Glasses of wine per week     Comment: occassional wine  . Drug use: No  . Sexual activity: No   Other Topics Concern  . Not on file   Social History Narrative  . No narrative on file    Review of Systems: See HPI, otherwise negative ROS  Physical Exam: BP 119/63   Pulse 66  Temp 97.6 F (36.4 C) (Oral)   Ht 5' (1.524 m)   Wt 106 lb 12.8 oz (48.4 kg)   BMI 20.86 kg/m  General:   Alert,   pleasant and cooperative in NAD Lungs:  Clear throughout to auscultation.   No wheezes, crackles, or rhonchi. No acute distress. Heart:  Regular rate and rhythm; no murmurs, clicks, rubs,  or gallops. Abdomen: Non-distended, normal bowel sounds.  Soft and nontender without appreciable mass or hepatosplenomegaly.  Pulses:  Normal pulses noted. Extremities:  Without clubbing or edema.  Impression:  Pleasant 83 year old lady with history of IBS-D and GERD -  both doing well on her current regimen. No need to change anything at this time.  Recommendations:  Continue probiotic and Questran daily  Continue Aciphex daily  Office visit in 6 months  Call if any interim problems                            Notice: This dictation was prepared with Dragon dictation along with smaller phrase technology. Any transcriptional errors that result from this process are unintentional and may not be corrected upon review.

## 2017-02-13 NOTE — Patient Instructions (Signed)
Continue probiotic and Questran daily  Continue Aciphex daily  Office visit in 6 months  Call if any interim problems

## 2017-02-15 ENCOUNTER — Telehealth: Payer: Self-pay | Admitting: *Deleted

## 2017-02-15 NOTE — Telephone Encounter (Signed)
Called patient with test results. No answer. Left message to call back.  

## 2017-02-15 NOTE — Telephone Encounter (Signed)
-----   Message from Josue Hector, MD sent at 02/15/2017 12:11 PM EDT ----- NSR no significant arrhythmia

## 2017-02-15 NOTE — Telephone Encounter (Signed)
Pt called you back. Can be reached on home phone.

## 2017-02-16 NOTE — Telephone Encounter (Signed)
Pt notified of results

## 2017-02-19 ENCOUNTER — Ambulatory Visit (INDEPENDENT_AMBULATORY_CARE_PROVIDER_SITE_OTHER): Payer: Medicare Other | Admitting: Neurology

## 2017-02-19 ENCOUNTER — Encounter: Payer: Self-pay | Admitting: Neurology

## 2017-02-19 VITALS — BP 136/66 | HR 64 | Wt 108.4 lb

## 2017-02-19 DIAGNOSIS — R55 Syncope and collapse: Secondary | ICD-10-CM | POA: Diagnosis not present

## 2017-02-19 NOTE — Progress Notes (Signed)
[facility] [address] [place]. [place] [address] [phone]       OFFICE FOLLOW UP VISIT NOTE  Angela. Angela Galvan Date of Birth:  1932-06-05 Medical Record Number:  [id number]   Referring MD:  Angela Galvan  Reason for Referral:  Passing out  HPI: Initial Consult 12/11/2016 ; Angela Galvan is a pleasant 83 year old Caucasian lady whose had 2 episodes of brief loss of consciousness in December 2017 as well as Akaysha 2018. In the first episode she remembers that she was sitting in a car. She remembers is that some passersby helped her back to her car and her hand had a lot of dirt in it. She had apparently fallen down in a pothole but she did not remember how it happened. She did not see medical help at that time. She is did not have any headache or confusion or any other focal symptoms at that time. The second episode occurred on 11/03/2017. She was planning to walk in doing the splits and velocities and had parked her car. The next thing she remembers is that she was sitting up in a chair inside the restaurant with EMS and code talking to her. She apparently had fallen in the parking lot and some passersby had called for help. She needed some help to walk to the chair inside the restaurant. She denied again any headache and states her mind was clear. She had no trouble speaking or moving her extremities. She has no prior history of known seizures, significant head injury, strokes or TIAs. She states her memory is good. She is fairly independent in all her to do daily living. Interestingly she informs me that her twin sister who was involved in 2 separate car wrecks apparently due to having passed out. She cannot provide me with more specific details about her workup. The patient complains of delayed sleep and is unable to perform asleep late in the night. Take however does not get up to around known every day. She denies any daytime sleepiness, sleepiness while driving. She was taken to  [facility] after her last episode of syncope in [place]i 2018 CT scan of the head showed marked abnormality. CT scan of his spine showed no broken bones. Consequently echo showed normal ejection fraction. Telemetry monitoring during hospitalization did not reveal significant cardiac arrhythmias. She has seen Dr. Jenkins Galvan cardiologist as an outpatient who is a patient to see me for neurological causes of syncope. She has not had any brain vascular imaging or EEG studies done. She denies any remote history of syncope child  Or earlier in Update 02/19/2017 : She returns for follow-up after last visit with me 2 months ago. She is doing well and states she's had no further episodes of passing out or nearly passing out. She denies any headaches any other new problems. She states she is sleeping much better. She had 30 day outpatient Holter monitor study on 02/20/17 which showed no evidence of cardiac arrhythmias. EEG done on 01/10/17 was normal. She had previously had a tonsillar echo done on 11/04/16 which was unremarkable. MRI scan the brain done on 8/29 ferritin should mild age-related changes of generalized atrophy and small vessel disease. MRA of the brain showed no significant intracranial stenosis or aneurysms. MRA of the neck showed no significant narrowing at either carotid bifurcation. Both vertebral arteries have antegrade flow but there is area of signal loss in moderate proximal right vertebral artery which may be artifact versus stenosis. ROS:  14 system review of systems is positive for   no complaints and all other systems negative  PMH:  Past Medical History:  Diagnosis Date  . Anxiety   . Arthritis   . Cancer [facility])    breast cancer left with mastectomy  . 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 Depression   . Diabetes mellitus    no meds.-diet controlled  . Diarrhea   . Diverticulitis    s/p  colectomy in 1991  . GERD (gastroesophageal reflux disease)   . HOH (hard of hearing)   . HTN (hypertension)    Does not see a cardiologist  . Hyperlipidemia   . Hypothyroidism   . IBS (irritable bowel syndrome)   . S/P colonoscopy 2003   minimal internal hemorrhoids, pancolonic diverticula, biopsy of rectum: lymphoplasmacytic microscopic colitis, colon biopsies normal  . S/P colonoscopy 2012   pancolonic diverticula, nl TI, external hemorrhoidal tag,/rectum bx no abnormalities  . S/P endoscopy 2012   normal esophagus, stenotic pyloric channel, TTS dilation  . Stroke ([place]) 08/10/2006   no residual    Social History:  Social History   Social History  . Marital status: Married    Spouse name: N/A  . Number of children: N/A  . Years of education: N/A   Occupational History  . retired Retired   Social History Main Topics  . Smoking status: Never Smoker  . Smokeless tobacco: Never Used     Comment: Never smoked  . Alcohol use 0.6 oz/week    1 Glasses of wine per week     Comment: occassional wine  . Drug use: No  . Sexual activity: No   Other Topics Concern  . Not on file   Social History Narrative  . No narrative on file    Medications:   Current Outpatient Prescriptions on File Prior to Visit  Medication Sig Dispense Refill  . acetaminophen (TYLENOL) 500 MG tablet Take 500-1,000 mg by mouth every 6 (six) hours as needed. Pain     . ALPRAZolam (XANAX) 0.5 MG tablet Take 0.5 mg by mouth 2 (two) times daily as needed for sleep or anxiety.    Marland Kitchen aspirin 325 MG tablet Take 325 mg by mouth daily.    Marland Kitchen atorvastatin (LIPITOR) 10 MG tablet Take 10 mg by mouth daily after breakfast.    . cholestyramine (QUESTRAN) 4 g packet Take 1 packet by mouth at bedtime. 60 packet 3  . clidinium-chlordiazePOXIDE (LIBRAX) 5-2.5 MG capsule TAKE 1 CAPSULE BY MOUTH FOUR TIMES DAILY BEFORE MEALS AND AT BEDTIME AS NEEDED. 40 capsule 1  . FLUoxetine (PROZAC) 20 MG capsule Take 20 mg by mouth  daily.    Marland Kitchen levothyroxine (SYNTHROID, LEVOTHROID) 50 MCG tablet Take 50 mcg by mouth daily before breakfast.     . loperamide (IMODIUM) 2 MG capsule Take 2 mg by mouth as needed for diarrhea or loose stools.    Marland Kitchen losartan (COZAAR) 100 MG tablet Take 100 mg by mouth daily.    . Probiotic Product (ALIGN PO) Take 1 tablet by mouth daily.     . RABEprazole (ACIPHEX) 20 MG tablet TAKE 1 TABLET DAILY 90 tablet 3  . RESTASIS 0.05 % ophthalmic emulsion Place 1 drop into both eyes every 12 (twelve) hours.     . topiramate (TOPAMAX) 25 MG tablet Take 25 mg by mouth daily.     Marland Kitchen UNABLE TO  FIND Rx: L8000- Post Surgical Bras (Quantity: 6) U2725- Silicone Breast Prosthesis (Quantity: 1) Dx: 174.9; Left Mastectomy 1 each 0  . UNABLE TO FIND Rx: [id number]- Mastectomy Form, left (Quantity: 1) Dx: 174.9; Left mastectomy 1 each 0   No current facility-administered medications on file prior to visit.     Allergies:  No Known Allergies  Physical Exam General:  Frail elderly pleasant Caucasian lady, seated, in no evident distress Head: head normocephalic and atraumatic.   Neck: supple with no carotid or supraclavicular bruits Cardiovascular: regular rate and rhythm, no murmurs Musculoskeletal: no deformity Skin:  no rash/petichiae Vascular:  Normal pulses all extremities  Neurologic Exam Mental Status: Awake and fully alert. Oriented to place and time. Recent and remote memory intact. Attention span, concentration and fund of knowledge appropriate. Mood and affect appropriate.  Cranial Nerves: Fundoscopic exam not done . Pupils equal, briskly reactive to light. Extraocular movements full without nystagmus. Visual fields full to confrontation. Hearing intact. Facial sensation intact. Face, tongue, palate moves normally and symmetrically.  Motor: Normal bulk and tone. Normal strength in all tested extremity muscles. Sensory.: intact to touch , pinprick , position and vibratory sensation.  Coordination: Rapid  alternating movements normal in all extremities. Finger-to-nose and heel-to-shin performed accurately bilaterally. Gait and Station: Arises from chair without difficulty. Stance is normal. Gait demonstrates normal stride length and balance . Able to heel, toe and tandem walk without difficulty.  Reflexes: 1+ and symmetric. Toes downgoing.       ASSESSMENT: 83 year old lady with 2 separate episodes of brief loss of consciousness without warning or associated symptoms likely syncope. Complex partial seizures or vertebral basilar TIAs are possible though less likely    PLAN: I had a long discussion with the patient and her daughter regarding her episodes of brief loss of consciousness and discuss results of EEG, Holter monitor, MRI and MRAs. I recommend checking CT angiogram of the neck with and without contrast to evaluate the proximal right vertebral artery origin stenosis versus artifact. If stenosis is confirmed may consider elective angioplasty stenting. I advised her to keep herself well-hydrated and not to drive till 6 months from her episode of passing out. She will return for follow-up in 6 months or call earlier if necessary. .Greater than 50% time during this 35 minute  isit was spent on counseling and coordination of care about her syncope and discussing workup and treatment She will return for follow-up with me in 6 months or call earlier if necessary. Antony Contras, MD  [facility] [address] [place], [place] [address]  Phone [phone] Fax [phone] Note: This document was prepared with digital dictation and possible smart phrase technology. Any transcriptional errors that result from this process are unintentional.

## 2017-02-19 NOTE — Patient Instructions (Signed)
I had a long discussion with the patient and her daughter regarding her episodes of brief loss of consciousness and discuss results of EEG, Holter monitor, MRI and MRAs. I recommend checking CT angiogram of the neck with and without contrast to evaluate the proximal right vertebral artery origin stenosis versus artifact. If stenosis is confirmed may consider elective angioplasty stenting. I advised her to keep herself well-hydrated and not to drive till 6 months from her episode of passing out. She will return for follow-up in 6 months or call earlier if necessary.   Syncope Syncope is when you lose temporarily pass out (faint). Signs that you may be about to pass out include:  Feeling dizzy or light-headed.  Feeling sick to your stomach (nauseous).  Seeing all white or all black.  Having cold, clammy skin.  If you passed out, get help right away. Call your local emergency services (911 in the [place] Do not drive yourself to the hospital. Follow these instructions at home: Pay attention to any changes in your symptoms. Take these actions to help with your condition:  Have someone stay with you until you feel stable.  Do not drive, use machinery, or play sports until your doctor says it is okay.  Keep all follow-up visits as told by your doctor. This is important.  If you start to feel like you might pass out, lie down right away and raise (elevate) your feet above the level of your heart. Breathe deeply and steadily. Wait until all of the symptoms are gone.  Drink enough fluid to keep your pee (urine) clear or pale yellow.  If you are taking blood pressure or heart medicine, get up slowly and spend many minutes getting ready to sit and then stand. This can help with dizziness.  Take over-the-counter and prescription medicines only as told by your doctor.  Get help right away if:  You have a very bad headache.  You have unusual pain in your chest, tummy, or back.  You are  bleeding from your mouth or rectum.  You have black or tarry poop (stool).  You have a very fast or uneven heartbeat (palpitations).  It hurts to breathe.  You pass out once or more than once.  You have jerky movements that you cannot control (seizure).  You are confused.  You have trouble walking.  You are very weak.  You have vision problems. These symptoms may be an emergency. Do not wait to see if the symptoms will go away. Get medical help right away. Call your local emergency services (911 in the [place] Do not drive yourself to the hospital. This information is not intended to replace advice given to you by your health care provider. Make sure you discuss any questions you have with your health care provider. Document Released: 11/01/2007 Document Revised: 10/21/2015 Document Reviewed: 01/27/2015 Elsevier Interactive Patient Education  [employer].

## 2017-02-20 ENCOUNTER — Telehealth: Payer: Self-pay | Admitting: Neurology

## 2017-02-20 NOTE — Telephone Encounter (Signed)
Patient is scheduled to have her CT Angio Neck w or wo contrast at [facility] for Tuesday 05/30/16 arrival time is 12:45 pm. Patient is aware of time and day.

## 2017-02-27 ENCOUNTER — Ambulatory Visit (HOSPITAL_COMMUNITY)
Admission: RE | Admit: 2017-02-27 | Discharge: 2017-02-27 | Disposition: A | Discharge status: Home / Self Care | Payer: Medicare Other | Source: Ambulatory Visit | Attending: Neurology | Admitting: Neurology

## 2017-02-27 DIAGNOSIS — R55 Syncope and collapse: Secondary | ICD-10-CM | POA: Diagnosis not present

## 2017-02-27 DIAGNOSIS — I6501 Occlusion and stenosis of right vertebral artery: Secondary | ICD-10-CM | POA: Insufficient documentation

## 2017-02-27 DIAGNOSIS — I6529 Occlusion and stenosis of unspecified carotid artery: Secondary | ICD-10-CM | POA: Insufficient documentation

## 2017-02-27 MED ORDER — IOPAMIDOL (ISOVUE-370) INJECTION 76%
75.0000 mL | Freq: Once | INTRAVENOUS | Status: AC | PRN
  Administered 2017-02-27: 75 mL via INTRAVENOUS

## 2017-03-05 ENCOUNTER — Telehealth: Payer: Self-pay

## 2017-03-05 NOTE — Telephone Encounter (Signed)
Rn call patient that the CT angiogram shows only mild narrowing at the origin of the right vertebral artery in the neck. This was likely overestimated on the previous MRA. She does not need to worry about angioplasty or stenting treatment at the present time PT verbalized understanding.

## 2017-03-05 NOTE — Telephone Encounter (Signed)
-----   Message from Garvin Fila, MD sent at 03/03/2017 11:29 AM EDT ----- Mitchell Heir inform the patient that CT angiogram shows only mild narrowing at the origin of the right vertebral artery in the neck. This was likely overestimated on the previous MRA. She does not need to worry about angioplasty or stenting treatment at the present time

## 2017-05-10 LAB — BASIC METABOLIC PANEL
BUN: 14 (ref 4–21)
Chloride: 110 — AB (ref 99–108)
Creatinine: 1 (ref <=1.1)
Glucose: 110
Potassium: 4.6 (ref 3.4–5.3)
Sodium: 143 (ref 137–147)

## 2017-05-23 ENCOUNTER — Other Ambulatory Visit (HOSPITAL_COMMUNITY): Payer: Self-pay | Admitting: Internal Medicine

## 2017-05-23 DIAGNOSIS — Z780 Asymptomatic menopausal state: Secondary | ICD-10-CM

## 2017-05-24 ENCOUNTER — Other Ambulatory Visit: Payer: Self-pay | Admitting: Gastroenterology

## 2017-06-07 ENCOUNTER — Inpatient Hospital Stay (HOSPITAL_COMMUNITY)
Admission: RE | Admit: 2017-06-07 | Discharge: 2017-06-07 | Disposition: A | Discharge status: Home / Self Care | Payer: Medicare Other | Source: Ambulatory Visit | Attending: Internal Medicine | Admitting: Internal Medicine

## 2017-06-07 ENCOUNTER — Encounter (HOSPITAL_COMMUNITY): Payer: Self-pay

## 2017-06-26 ENCOUNTER — Encounter: Payer: Self-pay | Admitting: Internal Medicine

## 2017-07-26 ENCOUNTER — Other Ambulatory Visit: Payer: Self-pay | Admitting: Gastroenterology

## 2017-07-30 ENCOUNTER — Telehealth: Payer: Self-pay | Admitting: Internal Medicine

## 2017-07-30 MED ORDER — CHOLESTYRAMINE 4 G PO PACK: PACK | ORAL | 5 refills | Status: DC

## 2017-07-30 MED ORDER — CHOLESTYRAMINE 4 G PO PACK: 1.0000 | PACK | Freq: Every day | ORAL | 3 refills | Status: DC

## 2017-07-30 NOTE — Telephone Encounter (Signed)
Done

## 2017-07-30 NOTE — Telephone Encounter (Signed)
Pt took her last [place] packet today and needs her  Prescription called into [place]. She is needing this today.

## 2017-07-30 NOTE — Addendum Note (Signed)
Addended by: Annitta Needs on: 07/30/2017 04:19 PM   Modules accepted: Orders

## 2017-07-30 NOTE — Telephone Encounter (Signed)
Routing to [facility] refill 

## 2017-08-14 ENCOUNTER — Ambulatory Visit: Payer: Medicare Other | Admitting: Internal Medicine

## 2017-08-14 ENCOUNTER — Encounter: Payer: Self-pay | Admitting: Internal Medicine

## 2017-08-14 VITALS — BP 114/68 | HR 69 | Temp 97.2°F | Ht 60.0 in | Wt 111.6 lb

## 2017-08-14 DIAGNOSIS — K582 Mixed irritable bowel syndrome: Secondary | ICD-10-CM

## 2017-08-14 DIAGNOSIS — K219 Gastro-esophageal reflux disease without esophagitis: Secondary | ICD-10-CM

## 2017-08-14 NOTE — Progress Notes (Signed)
Primary Care Physician:  Asencion Noble, MD Primary Gastroenterologist:  Dr.   Pre-Procedure History & Physical: HPI:  Angela Galvan is a 84 y.o. female here for follow-up of diarrhea and GERD. Felt to have IBS-D. Has done very well on the cholestyramine. Only 4 bouts of diarrhea (single episodes) since I last saw her 6 months ago. All in all, things are going well although the new generic preparation is little harder for her to swallow.  Appetite is good. We have her up 6 pounds since her last visit.  Past Medical History:  Diagnosis Date  . Anxiety   . Arthritis   . Cancer [facility])    breast cancer left with mastectomy  . 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 Depression   . Diabetes mellitus    no meds.-diet controlled  . Diarrhea   . Diverticulitis    s/p colectomy in 1991  . GERD (gastroesophageal reflux disease)   . HOH (hard of hearing)   . HTN (hypertension)    Does not see a cardiologist  . Hyperlipidemia   . Hypothyroidism   . IBS (irritable bowel syndrome)   . S/P colonoscopy 2003   minimal internal hemorrhoids, pancolonic diverticula, biopsy of rectum: lymphoplasmacytic microscopic colitis, colon biopsies normal  . S/P colonoscopy 2012   pancolonic diverticula, nl TI, external hemorrhoidal tag,/rectum bx no abnormalities  . S/P endoscopy 2012   normal esophagus, stenotic pyloric channel, TTS dilation  . Stroke ([place]) 08/10/2006   no residual    Past Surgical History:  Procedure Laterality Date  . ABDOMINAL HYSTERECTOMY  1983  . bilateral cataracts    . BIOPSY  03/23/2016   Procedure: BIOPSY;  Surgeon: Daneil Dolin, MD;  [facility];  Service: Endoscopy;;  colon   . BREAST BIOPSY Left 01/10/2013   Procedure: BREAST BIOPSY;  Surgeon: Jamesetta So, MD;  Location: [facility];  Service: General;  Laterality: Left;  . BREAST LUMPECTOMY Right 01/29/2015   Procedure:  RIGHT BREAST LUMPECTOMY;  Surgeon: Fanny Skates, MD;  Location: [facility];  Service: General;  Laterality: Right;  . CHOLECYSTECTOMY    . COLECTOMY    . COLONOSCOPY  09/11/01   Minimal internal hemorrhoids, otherwise normal rectum . Scattered pancolonic diverticula (few) The colonic mucosa otherwise appeared normal status post biopsies and stool sampling as described above.  . COLONOSCOPY N/A 03/23/2016   Procedure: COLONOSCOPY;  Surgeon: Daneil Dolin, MD;  [facility]: [facility];  Service: Endoscopy;  Laterality: N/A;  1115  . ESOPHAGOGASTRODUODENOSCOPY  10/11/2010   Normal esophagus, small hiatal hernia,Stenotic pyloric channel likely critical.  Likely contributing to some of the patient's symptoms, status post TTS balloon dilation Normal D1-D3 status post biopsy.  D2-D3 atrophic-appearing gastric mucosa status post biopsy  . ileocolonoscopy  10/11/2010   external hemorrhoidal tag.  Normal rectum status post biopsy Pancolonic diverticula status post segmental biopsy.  Normal  terminal ileum  . JOINT REPLACEMENT Bilateral   . KNEE ARTHROPLASTY Right 2004  . KNEE SURGERY Bilateral    2004- [facility]- total; knee  . right ovarian tumor removed as teenager    . SIMPLE MASTECTOMY WITH AXILLARY SENTINEL NODE BIOPSY Left 03/07/2013   Procedure: TOTAL  MASTECTOMY WITH AXILLARY SENTINEL NODE BIOPSY;  Surgeon: Adin Hector, MD;  Location: [place];  Service: General;  Laterality: Left;  . TOTAL KNEE ARTHROPLASTY  09/25/2011   Procedure: TOTAL KNEE ARTHROPLASTY;  Surgeon: Gearlean Alf, MD;  Location: [facility];  Service: Orthopedics;  Laterality: Left;    Prior to Admission medications   Medication Sig Start Date End Date Taking? Authorizing Provider  acetaminophen (TYLENOL) 500 MG tablet Take 500-1,000 mg by mouth every 6 (six) hours as needed. Pain    Yes [provider]  ALPRAZolam (XANAX) 0.5 MG tablet Take 0.5 mg by mouth 2 (two) times daily as needed for sleep or  anxiety.   Yes [provider]  aspirin 325 MG tablet Take 325 mg by mouth daily.   Yes [provider]  atorvastatin (LIPITOR) 10 MG tablet Take 10 mg by mouth daily after breakfast.   Yes [provider]  cholestyramine (QUESTRAN) 4 g packet Take 1 packet by mouth at bedtime. 07/30/17  Yes Annitta Needs, NP  clidinium-chlordiazePOXIDE (LIBRAX) 5-2.5 MG capsule TAKE 1 CAPSULE BY MOUTH FOUR TIMES DAILY BEFORE MEALS AND AT BEDTIME AS NEEDED. 2016-09-19  Yes Carlis Stable, NP  FLUoxetine (PROZAC) 20 MG capsule Take 20 mg by mouth daily.   Yes [provider]  levothyroxine (SYNTHROID, LEVOTHROID) 50 MCG tablet Take 50 mcg by mouth daily before breakfast.    Yes [provider]  loperamide (IMODIUM) 2 MG capsule Take 2 mg by mouth as needed for diarrhea or loose stools.   Yes [provider]  losartan (COZAAR) 100 MG tablet Take 100 mg by mouth daily.   Yes [provider]  Probiotic Product (ALIGN PO) Take 1 tablet by mouth daily.    Yes [provider]  RABEprazole (ACIPHEX) 20 MG tablet TAKE 1 TABLET DAILY 12/22/16  Yes Annitta Needs, NP  RESTASIS 0.05 % ophthalmic emulsion Place 1 drop into both eyes every 12 (twelve) hours.  01/11/11  Yes [provider]  topiramate (TOPAMAX) 25 MG tablet Take 25 mg by mouth daily.    Yes [provider]  UNABLE TO FIND Rx: [place] (Quantity: 6) O7564- Silicone Breast Prosthesis (Quantity: 1) Dx: 174.9; Left Mastectomy 05/01/13  Yes Alphonsa Overall, MD  UNABLE TO FIND Rx: [id number]- Mastectomy Form, left (Quantity: 1) Dx: 174.9; Left mastectomy 07/01/13  Yes Fanny Skates, MD  cholestyramine (QUESTRAN) 4 g packet MIX 1 PACKET IN 4 TO 8 OUNCES OF BEVERAGE & DRINK AT BEDTIME AS DIRECTED. DO NOT TAKE WITHIN 2 HOURS OF OTHER MEDS. Patient not taking: Reported on 08/14/2017 07/30/17   Annitta Needs, NP  cholestyramine (QUESTRAN) 4 g packet MIX 1 PACKET IN 4 TO 8 OUNCES OF  BEVERAGE & DRINK AT BEDTIME AS DIRECTED. DO NOT TAKE WITHIN 2 HOURS OF OTHER MEDS. Patient not taking: Reported on 08/14/2017 07/30/17   Annitta Needs, NP    Allergies as of 08/14/2017  . (No Known Allergies)    Family History  Problem Relation Age of Onset  . Stroke Mother        deceased after 3 strokes 09/20/1999  . Diabetes Father   . Stroke Maternal Grandmother        deceased after 3 strokes  . Stroke Sister        twin sister  . Colon cancer Neg Hx     Social History   Socioeconomic History  . Marital status: Married    Spouse name: Not on file  . Number of children: Not on file  . Years of education: Not on file  . Highest education level: Not on  file  Social Needs  . Financial resource strain: Not on file  . Food insecurity - worry: Not on file  . Food insecurity - inability: Not on file  . Transportation needs - medical: Not on file  . Transportation needs - non-medical: Not on file  Occupational History  . Occupation: retired    Fish farm manager: RETIRED  Tobacco Use  . Smoking status: Never Smoker  . Smokeless tobacco: Never Used  . Tobacco comment: Never smoked  Substance and Sexual Activity  . Alcohol use: Yes    Alcohol/week: 0.6 oz    Types: 1 Glasses of wine per week    Comment: occassional wine  . Drug use: No  . Sexual activity: No  Other Topics Concern  . Not on file  Social History Narrative  . Not on file    Review of Systems: See HPI, otherwise negative ROS  Physical Exam: BP 114/68   Pulse 69   Temp (!) 97.2 F (36.2 C) (Oral)   Ht 5' (1.524 m)   Wt 111 lb 9.6 oz (50.6 kg)   BMI 21.80 kg/m  General:   Alert,  Well-developed, well-nourished, pleasant and cooperative in NAD Mouth:  No deformity or lesions. Neck:  Supple; no masses or thyromegaly. No significant cervical adenopathy. Lungs:  Clear throughout to auscultation.   No wheezes, crackles, or rhonchi. No acute distress. Heart:  Regular rate and rhythm; no murmurs, clicks, rubs,  or  gallops. Abdomen: Non-distended, normal bowel sounds.  Soft and nontender without appreciable mass or hepatosplenomegaly.  Pulses:  Normal pulses noted. Extremities:  Without clubbing or edema.  Impression:  Pleasant 84 year old lady with long-standing GERD well controlled on AcipHex 20 mg daily. IBS D managed with the cholestyramine 4 g daily. She is well versed in not taking this with any of her other medications.  Recommendations:Continue Rabeprazole 20 mg daily  Continue Cholestyramine daily with adequate fluids  Office visit with you in 9 months    Notice: This dictation was prepared with Dragon dictation along with smaller phrase technology. Any transcriptional errors that result from this process are unintentional and may not be corrected upon review.

## 2017-08-14 NOTE — Patient Instructions (Signed)
Continue Rabeprazole 20 mg daily  Continue Cholestyramine daily with adequate fluids  Office visit with you in 9 months

## 2017-08-20 ENCOUNTER — Ambulatory Visit: Payer: Medicare Other | Admitting: Neurology

## 2017-08-20 ENCOUNTER — Encounter: Payer: Self-pay | Admitting: Neurology

## 2017-08-21 ENCOUNTER — Telehealth: Payer: Self-pay

## 2017-08-21 NOTE — Telephone Encounter (Signed)
Appt cancel the same day 08/20/2017. NO show appt.

## 2017-08-23 ENCOUNTER — Ambulatory Visit: Payer: Medicare Other | Admitting: Neurology

## 2017-09-12 LAB — BASIC METABOLIC PANEL
BUN: 18 (ref 4–21)
Creatinine: 1.1 (ref 0.5–1.1)
Glucose: 101
Potassium: 4.6 (ref 3.4–5.3)
Sodium: 141 (ref 137–147)

## 2017-09-12 LAB — HEMOGLOBIN A1C: Hemoglobin A1C: 6

## 2017-10-31 ENCOUNTER — Encounter: Payer: Self-pay | Admitting: Neurology

## 2017-10-31 ENCOUNTER — Ambulatory Visit: Payer: Medicare Other | Admitting: Neurology

## 2017-10-31 VITALS — BP 132/64 | HR 69 | Ht 60.0 in | Wt 109.6 lb

## 2017-10-31 DIAGNOSIS — G479 Sleep disorder, unspecified: Secondary | ICD-10-CM | POA: Diagnosis not present

## 2017-10-31 NOTE — Patient Instructions (Signed)
I had a long discussion with the patient regarding her episodes of brief loss of consciousness which appears stable and did not recommend further work-up for that.  She has concerns about her disturbed sleep habits.  I talked about improving sleep hygiene and recommend she take melatonin 5 mg at sunset and avoid taking cat naps during the day and try to sleep only at night.  If her sleeping difficulties persist she was advised to seek further advice from her primary physician.  No routine scheduled follow-up appointment with me is necessary but she may be referred back in the future if needed Insomnia Insomnia is a sleep disorder that makes it difficult to fall asleep or to stay asleep. Insomnia can cause tiredness (fatigue), low energy, difficulty concentrating, mood swings, and poor performance at work or school. There are three different ways to classify insomnia:  Difficulty falling asleep.  Difficulty staying asleep.  Waking up too early in the morning.  Any type of insomnia can be long-term (chronic) or short-term (acute). Both are common. Short-term insomnia usually lasts for three months or less. Chronic insomnia occurs at least three times a week for longer than three months. What are the causes? Insomnia may be caused by another condition, situation, or substance, such as:  Anxiety.  Certain medicines.  Gastroesophageal reflux disease (GERD) or other gastrointestinal conditions.  Asthma or other breathing conditions.  Restless legs syndrome, sleep apnea, or other sleep disorders.  Chronic pain.  Menopause. This may include hot flashes.  Stroke.  Abuse of alcohol, tobacco, or illegal drugs.  Depression.  Caffeine.  Neurological disorders, such as Alzheimer disease.  An overactive thyroid (hyperthyroidism).  The cause of insomnia may not be known. What increases the risk? Risk factors for insomnia include:  Gender. Women are more commonly affected than  men.  Age. Insomnia is more common as you get older.  Stress. This may involve your professional or personal life.  Income. Insomnia is more common in people with lower income.  Lack of exercise.  Irregular work schedule or night shifts.  Traveling between different time zones.  What are the signs or symptoms? If you have insomnia, trouble falling asleep or trouble staying asleep is the main symptom. This may lead to other symptoms, such as:  Feeling fatigued.  Feeling nervous about going to sleep.  Not feeling rested in the morning.  Having trouble concentrating.  Feeling irritable, anxious, or depressed.  How is this treated? Treatment for insomnia depends on the cause. If your insomnia is caused by an underlying condition, treatment will focus on addressing the condition. Treatment may also include:  Medicines to help you sleep.  Counseling or therapy.  Lifestyle adjustments.  Follow these instructions at home:  Take medicines only as directed by your health care provider.  Keep regular sleeping and waking hours. Avoid naps.  Keep a sleep diary to help you and your health care provider figure out what could be causing your insomnia. Include: ? When you sleep. ? When you wake up during the night. ? How well you sleep. ? How rested you feel the next day. ? Any side effects of medicines you are taking. ? What you eat and drink.  Make your bedroom a comfortable place where it is easy to fall asleep: ? Put up shades or special blackout curtains to block light from outside. ? Use a white noise machine to block noise. ? Keep the temperature cool.  Exercise regularly as directed by your health care  provider. Avoid exercising right before bedtime.  Use relaxation techniques to manage stress. Ask your health care provider to suggest some techniques that may work well for you. These may include: ? Breathing exercises. ? Routines to release muscle  tension. ? Visualizing peaceful scenes.  Cut back on alcohol, caffeinated beverages, and cigarettes, especially close to bedtime. These can disrupt your sleep.  Do not overeat or eat spicy foods right before bedtime. This can lead to digestive discomfort that can make it hard for you to sleep.  Limit screen use before bedtime. This includes: ? Watching TV. ? Using your smartphone, tablet, and computer.  Stick to a routine. This can help you fall asleep faster. Try to do a quiet activity, brush your teeth, and go to bed at the same time each night.  Get out of bed if you are still awake after 15 minutes of trying to sleep. Keep the lights down, but try reading or doing a quiet activity. When you feel sleepy, go back to bed.  Make sure that you drive carefully. Avoid driving if you feel very sleepy.  Keep all follow-up appointments as directed by your health care provider. This is important. Contact a health care provider if:  You are tired throughout the day or have trouble in your daily routine due to sleepiness.  You continue to have sleep problems or your sleep problems get worse. Get help right away if:  You have serious thoughts about hurting yourself or someone else. This information is not intended to replace advice given to you by your health care provider. Make sure you discuss any questions you have with your health care provider. Document Released: 05/12/2000 Document Revised: 10/15/2015 Document Reviewed: 02/13/2014 Elsevier Interactive Patient Education  [employer].

## 2017-10-31 NOTE — Progress Notes (Signed)
[facility] [address] [place]. [place] [address] [phone]       OFFICE FOLLOW UP VISIT NOTE  Angela Galvan. Angela Galvan Galvan Date of Birth:  Angela Galvan Galvan 27, 1934 Medical Record Number:  [id number]   Referring MD:  Asencion Noble  Reason for Referral:  Passing out  HPI: Initial Consult 12/11/2016 ; Angela Galvan Galvan is a pleasant 84 year old Caucasian lady whose had 2 episodes of brief loss of consciousness in December 2017 as well as [name] 2018. In the first episode she remembers that she was sitting in a car. She remembers is that some passersby helped her back to her car and her hand had a lot of dirt in it. She had apparently fallen down in a pothole but she did not remember how it happened. She did not see medical help at that time. She is did not have any headache or confusion or any other focal symptoms at that time. The second episode occurred on 11/03/2017. She was planning to walk in doing the splits and velocities and had parked her car. The next thing she remembers is that she was sitting up in a chair inside the restaurant with EMS and code talking to her. She apparently had fallen in the parking lot and some passersby had called for help. She needed some help to walk to the chair inside the restaurant. She denied again any headache and states her mind was clear. She had no trouble speaking or moving her extremities. She has no prior history of known seizures, significant head injury, strokes or TIAs. She states her memory is good. She is fairly independent in all her to do daily living. Interestingly she informs me that her twin sister who was involved in 2 separate car wrecks apparently due to having passed out. She cannot provide me with more specific details about her workup. The patient complains of delayed sleep and is unable to perform asleep late in the night. Take however does not get up to around known every day. She denies any daytime sleepiness, sleepiness while driving. She was taken to  [facility] after her last episode of syncope in [place] 2018 CT scan of the head showed marked abnormality. CT scan of his spine showed no broken bones. Consequently echo showed normal ejection fraction. Telemetry monitoring during hospitalization did not reveal significant cardiac arrhythmias. She has seen Dr. Jenkins [place] cardiologist as an outpatient who is a patient to see me for neurological causes of syncope. She has not had any brain vascular imaging or EEG studies done. She denies any remote history of syncope child  Or earlier in Update 02/19/2017 : She returns for follow-up after last visit with me 2 months ago. She is doing well and states she's had no further episodes of passing out or nearly passing out. She denies any headaches any other new problems. She states she is sleeping much better. She had 30 day outpatient Holter monitor study on 02/20/17 which showed no evidence of cardiac arrhythmias. EEG done on 01/10/17 was normal. She had previously had a tonsillar echo done on 11/04/16 which was unremarkable. MRI scan the brain done on 8/29 ferritin should mild age-related changes of generalized atrophy and small vessel disease. MRA of the brain showed no significant intracranial stenosis or aneurysms. MRA of the neck showed no significant narrowing at either carotid bifurcation. Both vertebral arteries have antegrade flow but there is area of signal loss in moderate proximal right vertebral artery which may be artifact versus stenosis. Update 10/31/2017 :  She returns for follow-up after last visit 6 months ago.  She has had no further episodes of passing out or nearly passing out and has done well.  She did undergo CT angiogram of the neck performed on 02/27/2017 which have personally reviewed showed no significant right vertebral artery origin stenosis in the stenosis noted on the MRA was likely overestimated.  Patient states she is tolerating aspirin well without bleeding or bruising.  Her blood  pressure remains well controlled and today it is 1 three 2/6 4.  She is tolerating Lipitor well without any side effects.  She has no complaints about disturbed sleep.  She has trouble falling asleep and at times cannot sleep ~early morning.  She then sleeps most of the morning and does not wake up till afternoon.  She also has some nightmares.  She does not take any sleep aids.  She has not tried melatonin so far.  She has not discussed this with the primary physician yet ROS:   14 system review of systems is positive for   Insomnia, nighmares, dreams, disturbed sleep and all other systems negative  PMH:  Past Medical History:  Diagnosis Date  . Anxiety   . Arthritis   . Cancer [facility])    breast cancer left with mastectomy  . 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 Depression   . Diabetes mellitus    no meds.-diet controlled  . Diarrhea   . Diverticulitis    s/p colectomy in 1991  . GERD (gastroesophageal reflux disease)   . HOH (hard of hearing)   . HTN (hypertension)    Does not see a cardiologist  . Hyperlipidemia   . Hypothyroidism   . IBS (irritable bowel syndrome)   . S/P colonoscopy 2003   minimal internal hemorrhoids, pancolonic diverticula, biopsy of rectum: lymphoplasmacytic microscopic colitis, colon biopsies normal  . S/P colonoscopy 2012   pancolonic diverticula, nl TI, external hemorrhoidal tag,/rectum bx no abnormalities  . S/P endoscopy 2012   normal esophagus, stenotic pyloric channel, TTS dilation  . Stroke ([place]) 08/10/2006   no residual    Social History:  Social History   Socioeconomic History  . Marital status: Married    Spouse name: Not on file  . Number of children: Not on file  . Years of education: Not on file  . Highest education level: Not on file  Occupational History  . Occupation: retired    Fish farm manager: RETIRED  Social Needs  . Financial resource strain:  Not on file  . Food insecurity:    Worry: Not on file    Inability: Not on file  . Transportation needs:    Medical: Not on file    Non-medical: Not on file  Tobacco Use  . Smoking status: Never Smoker  . Smokeless tobacco: Never Used  . Tobacco comment: Never smoked  Substance and Sexual Activity  . Alcohol use: Yes    Alcohol/week: 0.6 oz    Types: 1 Glasses of wine per week    Comment: occassional wine  . Drug use: No  . Sexual activity: Never  Lifestyle  . Physical activity:    Days per week: Not on file    Minutes per session: Not on file  . Stress: Not on file  Relationships  . Social connections:    Talks on phone: Not on file    Gets together: Not on file  Attends religious service: Not on file    Active member of club or organization: Not on file    Attends meetings of clubs or organizations: Not on file    Relationship status: Not on file  . Intimate partner violence:    Fear of current or ex partner: Not on file    Emotionally abused: Not on file    Physically abused: Not on file    Forced sexual activity: Not on file  Other Topics Concern  . Not on file  Social History Narrative  . Not on file    Medications:   Current Outpatient Medications on File Prior to Visit  Medication Sig Dispense Refill  . acetaminophen (TYLENOL) 500 MG tablet Take 500-1,000 mg by mouth every 6 (six) hours as needed. Pain     . ALPRAZolam (XANAX) 0.5 MG tablet Take 0.5 mg by mouth 2 (two) times daily as needed for sleep or anxiety.    Marland Kitchen aspirin 325 MG tablet Take 325 mg by mouth daily.    Marland Kitchen atorvastatin (LIPITOR) 10 MG tablet Take 10 mg by mouth daily after breakfast.    . cholestyramine (QUESTRAN) 4 g packet Take 1 packet by mouth at bedtime. 60 packet 3  . clidinium-chlordiazePOXIDE (LIBRAX) 5-2.5 MG capsule TAKE 1 CAPSULE BY MOUTH FOUR TIMES DAILY BEFORE MEALS AND AT BEDTIME AS NEEDED. 40 capsule 1  . FLUoxetine (PROZAC) 20 MG capsule Take 20 mg by mouth daily.    Marland Kitchen  levothyroxine (SYNTHROID, LEVOTHROID) 50 MCG tablet Take 50 mcg by mouth daily before breakfast.     . loperamide (IMODIUM) 2 MG capsule Take 2 mg by mouth as needed for diarrhea or loose stools.    Marland Kitchen losartan (COZAAR) 100 MG tablet Take 100 mg by mouth daily.    . Probiotic Product (ALIGN PO) Take 1 tablet by mouth daily.     . RABEprazole (ACIPHEX) 20 MG tablet TAKE 1 TABLET DAILY 90 tablet 3  . topiramate (TOPAMAX) 25 MG tablet Take 25 mg by mouth daily.     Marland Kitchen UNABLE TO FIND Rx: L8000- Post Surgical Bras (Quantity: 6) N2778- Silicone Breast Prosthesis (Quantity: 1) Dx: 174.9; Left Mastectomy 1 each 0  . UNABLE TO FIND Rx: E4235- Mastectomy Form, left (Quantity: 1) Dx: 174.9; Left mastectomy 1 each 0   No current facility-administered medications on file prior to visit.     Allergies:  No Known Allergies  Physical Exam General:  Frail elderly pleasant Caucasian lady, seated, in no evident distress Head: head normocephalic and atraumatic.   Neck: supple with no carotid or supraclavicular bruits Cardiovascular: regular rate and rhythm, no murmurs Musculoskeletal: no deformity Skin:  no rash/petichiae Vascular:  Normal pulses all extremities  Neurologic Exam Mental Status: Awake and fully alert. Oriented to place and time. Recent and remote memory intact. Attention span, concentration and fund of knowledge appropriate. Mood and affect appropriate.  Cranial Nerves: Fundoscopic exam not done . Pupils equal, briskly reactive to light. Extraocular movements full without nystagmus. Visual fields full to confrontation. Hearing intact. Facial sensation intact. Face, tongue, palate moves normally and symmetrically.  Motor: Normal bulk and tone. Normal strength in all tested extremity muscles. Sensory.: intact to touch , pinprick , position and vibratory sensation.  Coordination: Rapid alternating movements normal in all extremities. Finger-to-nose and heel-to-shin performed accurately  bilaterally. Gait and Station: Arises from chair without difficulty. Stance is normal. Gait demonstrates normal stride length and balance . Able to heel, toe and tandem walk without  difficulty.  Reflexes: 1+ and symmetric. Toes downgoing.       ASSESSMENT: 84 year old lady with 2 separate episodes of brief loss of consciousness without warning or associated symptoms likely syncope. New complaints of disturbed sleep   PLAN: I had a long discussion with the patient regarding her episodes of brief loss of consciousness which appears stable and did not recommend further work-up for that.  She has concerns about her disturbed sleep habits.  I talked about improving sleep hygiene and recommend she take melatonin 5 mg at sunset and avoid taking cat naps during the day and try to sleep only at night.  If her sleeping difficulties persist she was advised to seek further advice from her primary physician.  No routine scheduled follow-up appointment with me is necessary but she may be referred back in the future if needed.Greater than 50% time during this 25 minute  isit was spent on counseling and coordination of care about her syncope and discussing workup and treatment   Antony Contras, MD  [facility] [address] [place], [place] [address]  Phone [phone] Fax [phone] Note: This document was prepared with digital dictation and possible smart phrase technology. Any transcriptional errors that result from this process are unintentional.

## 2017-11-08 ENCOUNTER — Other Ambulatory Visit (HOSPITAL_COMMUNITY): Payer: Self-pay | Admitting: Internal Medicine

## 2017-11-08 DIAGNOSIS — Z1231 Encounter for screening mammogram for malignant neoplasm of breast: Secondary | ICD-10-CM

## 2017-11-26 ENCOUNTER — Other Ambulatory Visit (HOSPITAL_COMMUNITY): Payer: Self-pay | Admitting: Otolaryngology

## 2017-11-26 DIAGNOSIS — R490 Dysphonia: Secondary | ICD-10-CM

## 2017-12-04 ENCOUNTER — Ambulatory Visit (HOSPITAL_COMMUNITY)
Admission: RE | Admit: 2017-12-04 | Discharge: 2017-12-04 | Disposition: A | Discharge status: Home / Self Care | Payer: Medicare Other | Source: Ambulatory Visit | Attending: Otolaryngology | Admitting: Otolaryngology

## 2017-12-04 DIAGNOSIS — K225 Diverticulum of esophagus, acquired: Secondary | ICD-10-CM | POA: Diagnosis not present

## 2017-12-04 DIAGNOSIS — R490 Dysphonia: Secondary | ICD-10-CM | POA: Diagnosis not present

## 2017-12-07 ENCOUNTER — Other Ambulatory Visit: Payer: Self-pay

## 2017-12-07 ENCOUNTER — Telehealth (HOSPITAL_COMMUNITY): Payer: Self-pay | Admitting: Speech Pathology

## 2017-12-07 MED ORDER — RABEPRAZOLE SODIUM 20 MG PO TBEC: 20.0000 mg | DELAYED_RELEASE_TABLET | Freq: Every day | ORAL | 3 refills | Status: DC

## 2017-12-07 NOTE — Telephone Encounter (Signed)
Spoke to [place] @ Dr. Janace Hoard office - she only wanted the patient to have Barium Swallow and disreguard the order that was faxed to our office. The Barium Swallow test has been completed. NF 12/07/17

## 2017-12-19 ENCOUNTER — Encounter (HOSPITAL_COMMUNITY): Payer: Self-pay

## 2017-12-19 ENCOUNTER — Ambulatory Visit (HOSPITAL_COMMUNITY)
Admission: RE | Admit: 2017-12-19 | Discharge: 2017-12-19 | Disposition: A | Discharge status: Home / Self Care | Payer: Medicare Other | Source: Ambulatory Visit | Attending: Internal Medicine | Admitting: Internal Medicine

## 2017-12-19 DIAGNOSIS — Z1231 Encounter for screening mammogram for malignant neoplasm of breast: Secondary | ICD-10-CM | POA: Diagnosis not present

## 2018-01-29 LAB — LIPID PANEL
Cholesterol: 142 (ref 0–200)
HDL: 70 (ref 35–70)
LDL Cholesterol: 44
Triglycerides: 141 (ref 40–160)

## 2018-01-29 LAB — BASIC METABOLIC PANEL
BUN: 23 — AB (ref 4–21)
Chloride: 105 (ref 99–108)
Creatinine: 1.5 — AB (ref <=1.1)
Glucose: 95
Potassium: 4.9 (ref 3.4–5.3)
Sodium: 139 (ref 137–147)

## 2018-01-29 LAB — CBC AND DIFFERENTIAL
HCT: 40 (ref 36–46)
Hemoglobin: 13.4 (ref 12.0–16.0)
WBC: 6.9

## 2018-01-29 LAB — HEPATIC FUNCTION PANEL
ALT: 13 (ref 7–35)
AST: 14 (ref 13–35)
Alkaline Phosphatase: 82 (ref 25–125)

## 2018-01-29 LAB — TSH: TSH: 5.74 (ref <=5.90)

## 2018-01-29 LAB — HEMOGLOBIN A1C: Hemoglobin A1C: 5.9

## 2018-02-26 ENCOUNTER — Encounter: Payer: Self-pay | Admitting: Internal Medicine

## 2018-02-26 ENCOUNTER — Telehealth: Payer: Self-pay

## 2018-02-26 ENCOUNTER — Ambulatory Visit: Payer: Medicare Other | Admitting: Internal Medicine

## 2018-02-26 VITALS — BP 112/64 | HR 79 | Temp 97.1°F | Ht 60.0 in | Wt 110.2 lb

## 2018-02-26 DIAGNOSIS — R05 Cough: Secondary | ICD-10-CM

## 2018-02-26 DIAGNOSIS — R059 Cough, unspecified: Secondary | ICD-10-CM

## 2018-02-26 DIAGNOSIS — K219 Gastro-esophageal reflux disease without esophagitis: Secondary | ICD-10-CM | POA: Diagnosis not present

## 2018-02-26 NOTE — Telephone Encounter (Signed)
Called pt, informed her [facility] would contact her for scheduling. Informed her RMR advised for her to stay on Protonix twice a day for study. Verbalized understanding.

## 2018-02-26 NOTE — Progress Notes (Addendum)
Primary Care Physician:  Asencion Noble, MD Primary Gastroenterologist:  Dr.   Pre-Procedure History & Physical: HPI:  Angela Galvan is a 84 y.o. female here for with chronic cough and some hoarseness.  Symptoms now for over a year.  Small Zenker's diverticulum noted.  Pulmonary evaluation initiated starting with a chest x-ray.  So far, after a few days worth of a steroid nasal spray, has not helped her cough.  She has been on high-dose acid suppression therapy in the way of twice daily Protonix for several months without any improvement. Several days of amoxicillin have not helped her symptoms  EGD 2012 - patient only had a small hiatal hernia; esophagus appeared normal.  Barium esophagram recently demonstrated a small Zenker's diverticulum - no obstruction to passage of a barium pill.  No reflux noted. Also, of note, no penetration or aspiration.    Past Medical History:  Diagnosis Date  . Anxiety   . Arthritis   . Cancer [facility])    breast cancer left with mastectomy  . 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 Depression   . Diabetes mellitus    no meds.-diet controlled  . Diarrhea   . Diverticulitis    s/p colectomy in 1991  . GERD (gastroesophageal reflux disease)   . HOH (hard of hearing)   . HTN (hypertension)    Does not see a cardiologist  . Hyperlipidemia   . Hypothyroidism   . IBS (irritable bowel syndrome)   . S/P colonoscopy 2003   minimal internal hemorrhoids, pancolonic diverticula, biopsy of rectum: lymphoplasmacytic microscopic colitis, colon biopsies normal  . S/P colonoscopy 2012   pancolonic diverticula, nl TI, external hemorrhoidal tag,/rectum bx no abnormalities  . S/P endoscopy 2012   normal esophagus, stenotic pyloric channel, TTS dilation  . Stroke ([place]) 08/10/2006   no residual    Past Surgical History:  Procedure Laterality Date  . ABDOMINAL HYSTERECTOMY  1983   . bilateral cataracts    . BIOPSY  03/23/2016   Procedure: BIOPSY;  Surgeon: Daneil Dolin, MD;  [facility];  Service: Endoscopy;;  colon   . BREAST BIOPSY Left 01/10/2013   Procedure: BREAST BIOPSY;  Surgeon: Jamesetta So, MD;  Location: [facility] OR[facility];  Service: General;  Laterality: Left;  . BREAST LUMPECTOMY Right 01/29/2015   Procedure: RIGHT BREAST LUMPECTOMY;  Surgeon: Fanny Skates, MD;  Location: [place] [facility];  Service: General;  Laterality: Right;  . CHOLECYSTECTOMY    . COLECTOMY    . COLONOSCOPY  09/11/01   Minimal internal hemorrhoids, otherwise normal rectum . Scattered pancolonic diverticula (few) The colonic mucosa otherwise appeared normal status post biopsies and stool sampling as described above.  . COLONOSCOPY N/A 03/23/2016   Procedure: COLONOSCOPY;  Surgeon: Daneil Dolin, MD;  [facility];  Service: Endoscopy;  Laterality: N/A;  1115  . ESOPHAGOGASTRODUODENOSCOPY  10/11/2010   Normal esophagus, small hiatal hernia,Stenotic pyloric channel likely critical.  Likely contributing to some of the patient's symptoms, status post TTS balloon dilation Normal D1-D3 status post biopsy.  D2-D3 atrophic-appearing gastric mucosa status post biopsy  . ileocolonoscopy  10/11/2010   external hemorrhoidal tag.  Normal rectum status post biopsy Pancolonic diverticula status post segmental biopsy.  Normal  terminal ileum  . JOINT REPLACEMENT Bilateral   . KNEE ARTHROPLASTY Right 2004  . KNEE SURGERY Bilateral    2004-  [place]- total; knee  . right ovarian tumor removed as teenager    . SIMPLE MASTECTOMY WITH AXILLARY SENTINEL NODE BIOPSY Left 03/07/2013   Procedure: TOTAL  MASTECTOMY WITH AXILLARY SENTINEL NODE BIOPSY;  Surgeon: Adin Hector, MD;  Location: [place];  Service: General;  Laterality: Left;  . TOTAL KNEE ARTHROPLASTY  09/25/2011   Procedure: TOTAL KNEE ARTHROPLASTY;  Surgeon: Gearlean Alf, MD;  Location: [facility];  Service: Orthopedics;   Laterality: Left;    Prior to Admission medications   Medication Sig Start Date End Date Taking? Authorizing Provider  acetaminophen (TYLENOL) 500 MG tablet Take 500-1,000 mg by mouth every 6 (six) hours as needed. Pain    Yes [provider]  ALPRAZolam (XANAX) 0.5 MG tablet Take 0.5 mg by mouth 2 (two) times daily as needed for sleep or anxiety.   Yes [provider]  amoxicillin (AMOXIL) 875 MG tablet Take 875 mg by mouth 2 (two) times daily.   Yes [provider]  aspirin 325 MG tablet Take 325 mg by mouth daily.   Yes [provider]  atorvastatin (LIPITOR) 10 MG tablet Take 10 mg by mouth daily after breakfast.   Yes [provider]  cholestyramine (QUESTRAN) 4 g packet Take 1 packet by mouth at bedtime. 07/30/17  Yes Annitta Needs, NP  clidinium-chlordiazePOXIDE (LIBRAX) 5-2.5 MG capsule TAKE 1 CAPSULE BY MOUTH FOUR TIMES DAILY BEFORE MEALS AND AT BEDTIME AS NEEDED. 08/24/16  Yes Carlis Stable, NP  FLUoxetine (PROZAC) 20 MG capsule Take 20 mg by mouth daily.   Yes [provider]  levothyroxine (SYNTHROID, LEVOTHROID) 50 MCG tablet Take 50 mcg by mouth daily before breakfast.    Yes [provider]  loperamide (IMODIUM) 2 MG capsule Take 2 mg by mouth as needed for diarrhea or loose stools.   Yes [provider]  losartan (COZAAR) 100 MG tablet Take 100 mg by mouth daily.   Yes [provider]  pantoprazole (PROTONIX) 40 MG tablet Take 40 mg by mouth 2 (two) times daily before a meal.   Yes [provider]  Probiotic Product (ALIGN PO) Take 1 tablet by mouth daily.    Yes [provider]  topiramate (TOPAMAX) 25 MG tablet Take 25 mg by mouth daily.    Yes [provider]  UNABLE TO FIND Rx: [place] (Quantity: 6) D9833- Silicone Breast Prosthesis (Quantity: 1) Dx: 174.9; Left Mastectomy 05/01/13  Yes Alphonsa Overall, MD  UNABLE TO FIND Rx: [phone]- Mastectomy Form, left  (Quantity: 1) Dx: 174.9; Left mastectomy 07/01/13  Yes Fanny Skates, MD  RABEprazole (ACIPHEX) 20 MG tablet Take 1 tablet (20 mg total) by mouth daily. Patient not taking: Reported on 02/26/2018 12/07/17   Annitta Needs, NP    Allergies as of 02/26/2018  . (No Known Allergies)    Family History  Problem Relation Age of Onset  . Stroke Mother        deceased after 3 strokes 18-Sep-1999  . Diabetes Father   . Stroke Maternal Grandmother        deceased after 3 strokes  . Stroke Sister        twin sister  . Colon cancer Neg Hx     Social History   Socioeconomic History  . Marital status: Married    Spouse name: Not on file  . Number of children: Not on file  . Years of education: Not on file  . Highest education level:  Not on file  Occupational History  . Occupation: retired    Fish farm manager: RETIRED  Social Needs  . Financial resource strain: Not on file  . Food insecurity:    Worry: Not on file    Inability: Not on file  . Transportation needs:    Medical: Not on file    Non-medical: Not on file  Tobacco Use  . Smoking status: Never Smoker  . Smokeless tobacco: Never Used  . Tobacco comment: Never smoked  Substance and Sexual Activity  . Alcohol use: Yes    Alcohol/week: 1.0 standard drinks    Types: 1 Glasses of wine per week    Comment: occassional wine  . Drug use: No  . Sexual activity: Never  Lifestyle  . Physical activity:    Days per week: Not on file    Minutes per session: Not on file  . Stress: Not on file  Relationships  . Social connections:    Talks on phone: Not on file    Gets together: Not on file    Attends religious service: Not on file    Active member of club or organization: Not on file    Attends meetings of clubs or organizations: Not on file    Relationship status: Not on file  . Intimate partner violence:    Fear of current or ex partner: Not on file    Emotionally abused: Not on file    Physically abused: Not on file    Forced sexual  activity: Not on file  Other Topics Concern  . Not on file  Social History Narrative  . Not on file    Review of Systems: See HPI, otherwise negative ROS  Physical Exam: BP 112/64   Pulse 79   Temp (!) 97.1 F (36.2 C) (Oral)   Ht 5' (1.524 m)   Wt 110 lb 3.2 oz (50 kg)   BMI 21.52 kg/m  General:   Alert,  pleasant and cooperative in NAD Neck:  Supple; no masses or thyromegaly. No significant cervical adenopathy. Lungs:  Clear throughout to auscultation.   No wheezes, crackles, or rhonchi. No acute distress. Heart:  Regular rate and rhythm; no murmurs, clicks, rubs,  or gallops. Abdomen: Non-distended, normal bowel sounds.  Soft and nontender without appreciable mass or hepatosplenomegaly.  Pulses:  Normal pulses noted. Extremities:  Without clubbing or edema.  Impression/Plan: 84 year old lady with chronic cough, predominantly when talking along with frequent episodes of hoarseness in the setting of a very small Zenker's diverticulum.  She has not improved with high-dose acid suppression therapy for at least 6 months now.  I agree with Dr. Janace Hoard, further evaluation of her sinuses and a pulmonary evaluation may be in order. In my experience, symptomatic Zenker's are almost always associated with cough when eating.  Angela Galvan has no such symptoms.  Pulled up the barium swallow images and reviewed with the patient.  The Zenker's appears to be very small.  She does have a history of CVA and she asked about it playing a role here.  Given her discrete symptoms and no aspiration or penetration on barium swallow,  I doubt this is the case.  It is remotely possible the patient could have non- acid reflux as a contributing factor to her cough.  This would require a ambulatory pH/impedance study to sort through further.   Recommendations:  Continue Protonix 40 mg twice daily  Will arrange ambulatory pH, impedence testing to look for non-acid causes of cough while  remaining on high-dose  acid suppression therapy.  No need to take Zantac  Get chest x-ray and F/u with Dr. Janace Hoard as directed  Further recommendations to follow from me in the near future   Notice: This dictation was prepared with Dragon dictation along with smaller phrase technology. Any transcriptional errors that result from this process are unintentional and may not be corrected upon review.

## 2018-02-26 NOTE — Patient Instructions (Signed)
Continue Protonix 40 mg twice daily  Will arrange ambulatory pH, impedence testing to look for non-acid causes of cough  No need to take Zantac  Get chest x-ray and F/u with Dr. Janace Hoard as directed  Further recommendations to follow from me in the near future

## 2018-02-26 NOTE — Telephone Encounter (Signed)
Rourk, Cristopher Estimable, MD  Derrick Ravel, [place]        Let us schedule patient for a pH/impedance study to further evaluate cough potentially caused by acidic or nonacidic reflux. Patient should stay on Protonix twice a day for study. Please call [facility] to get scheduled.    Spoke with spouse and he is aware someone will be calling to schedule procedure. Pt isn't at home at this moment. Will touch f/u with pt so she knows to take Protonix bid.

## 2018-02-26 NOTE — Telephone Encounter (Signed)
Referral sent to [facility] via Epic. [facility] will contact pt for scheduling.

## 2018-02-26 NOTE — Addendum Note (Signed)
Addended by: Zara Council C on: 02/26/2018 01:56 PM   Modules accepted: Orders

## 2018-02-27 NOTE — Telephone Encounter (Signed)
Noted  

## 2018-03-04 ENCOUNTER — Telehealth: Payer: Self-pay | Admitting: Gastroenterology

## 2018-03-04 NOTE — Telephone Encounter (Signed)
We should schedule the procedure directly for esophageal manometry with 24hr Ph impedance on PPI at [facility] once they are able to do it. Thanks

## 2018-03-04 NOTE — Telephone Encounter (Signed)
Pt called office, she hasn't heard from [facility]. Noted in Epic referral to [facility] was "closed" 02/28/18. Called [facility] to f/u on referral. Receptionist states referral was closed d/t pt was seen by [facility] 02/26/18. Informed receptionist pt was only referred for pH/impedence as our office doesn't do procedure. Receptionist spoke to nurse. They will send referral to doctor for review to schedule office visit first. Endoscopy center at hospital is closed until 03/20/18. Called pt back and informed her. Advised pt if she hasn't heard for  GI in couple weeks to contact our office.

## 2018-03-04 NOTE — Telephone Encounter (Signed)
Received referral from [facility]: Dr.Rourk for patient to be seen for "Needs pH/impedence study to further evaluate cough potentially caused by acidic or nonacidic reflux. Patient should stay on Protonix twice a day for study." I spoke to Dr.Rourk's office who states they can not do this study. Also notified [facility] that the endoscopy center at the hosp where this is done is shut done for the time being. Per nurse Barbera Setters please review chart and advise on scheduling ov.

## 2018-03-06 ENCOUNTER — Other Ambulatory Visit: Payer: Self-pay

## 2018-03-06 ENCOUNTER — Other Ambulatory Visit (HOSPITAL_COMMUNITY): Payer: Self-pay | Admitting: Internal Medicine

## 2018-03-06 ENCOUNTER — Ambulatory Visit (HOSPITAL_COMMUNITY)
Admission: RE | Admit: 2018-03-06 | Discharge: 2018-03-06 | Disposition: A | Discharge status: Home / Self Care | Payer: Medicare Other | Source: Ambulatory Visit | Attending: Internal Medicine | Admitting: Internal Medicine

## 2018-03-06 DIAGNOSIS — R059 Cough, unspecified: Secondary | ICD-10-CM

## 2018-03-06 DIAGNOSIS — K219 Gastro-esophageal reflux disease without esophagitis: Secondary | ICD-10-CM

## 2018-03-06 DIAGNOSIS — R05 Cough: Secondary | ICD-10-CM | POA: Insufficient documentation

## 2018-03-06 DIAGNOSIS — R079 Chest pain, unspecified: Secondary | ICD-10-CM | POA: Diagnosis not present

## 2018-03-06 NOTE — Telephone Encounter (Signed)
Scheduled the patient and notified her. [facility] 04/01/18 at 12:30. See the detailed instruction letter for further information.

## 2018-03-08 ENCOUNTER — Encounter: Payer: Self-pay | Admitting: Internal Medicine

## 2018-03-27 ENCOUNTER — Telehealth: Payer: Self-pay | Admitting: Gastroenterology

## 2018-03-27 NOTE — Telephone Encounter (Signed)
Ok, thank you

## 2018-03-27 NOTE — Telephone Encounter (Signed)
FYI Very sweet lady referred to [place] for an esophageal manometry and impedance study. Patient wants to wait on having this study. Her sister is terminal and is not expected to live past a week. She requests to call me once "everything is done." I have given her my name. She has our number to call.

## 2018-04-01 ENCOUNTER — Ambulatory Visit (HOSPITAL_COMMUNITY): Admit: 2018-04-01 | Payer: Medicare Other | Admitting: Gastroenterology

## 2018-04-01 ENCOUNTER — Encounter (HOSPITAL_COMMUNITY): Payer: Self-pay

## 2018-04-01 SURGERY — MANOMETRY, ESOPHAGUS

## 2018-04-17 ENCOUNTER — Telehealth: Payer: Self-pay | Admitting: Gastroenterology

## 2018-04-17 NOTE — Telephone Encounter (Signed)
Calling again to try and reschedule manometry appointment. Assured her we have not forgotten and will get her scheduled as soon as possible.

## 2018-04-17 NOTE — Telephone Encounter (Signed)
Patient calling regarding a letter she received to schedule manometry when she was ready to schedule

## 2018-04-17 NOTE — Telephone Encounter (Signed)
Patient has been contacted and scheduled for 05/13/18 at 8:30 am. Spoke with her. She still has her written instructions from the previously scheduled appointment.

## 2018-05-07 ENCOUNTER — Ambulatory Visit: Payer: Medicare Other | Admitting: Internal Medicine

## 2018-05-09 ENCOUNTER — Encounter: Payer: Self-pay | Admitting: Pulmonary Disease

## 2018-05-09 ENCOUNTER — Institutional Professional Consult (permissible substitution): Payer: Medicare Other | Admitting: Pulmonary Disease

## 2018-05-09 ENCOUNTER — Ambulatory Visit: Payer: Medicare Other | Admitting: Pulmonary Disease

## 2018-05-09 VITALS — BP 140/62 | HR 64 | Ht 61.0 in | Wt 113.4 lb

## 2018-05-09 DIAGNOSIS — R05 Cough: Secondary | ICD-10-CM | POA: Diagnosis not present

## 2018-05-09 DIAGNOSIS — R053 Chronic cough: Secondary | ICD-10-CM

## 2018-05-09 MED ORDER — ALBUTEROL SULFATE HFA 108 (90 BASE) MCG/ACT IN AERS: 2.0000 | INHALATION_SPRAY | Freq: Four times a day (QID) | RESPIRATORY_TRACT | 3 refills | Status: DC | PRN

## 2018-05-09 NOTE — Progress Notes (Signed)
Synopsis: Referred in 04/2018 for chronic cough  Subjective:   PATIENT ID: Angela Galvan GENDER: female DOB: [birth year], MRN: [id number]   HPI  Chief Complaint  Patient presents with  . consult    cough on and off about 1 year.  Hoarseness, hiccups and nasal drip. was told small diverticuli in esophagus and having a PH monitor test next week   [name] is a 84 year old female never smoker with type 2 diabetes, OA, GERD who presents as a new consultation for chronic cough.  Records including ENT clinic note have been reviewed and summarized: Patient with chronic nonproductive cough.  Never has seen pulmonary.  Planning for pH probe for reflux. Per in ENT she has no significant sinusitis that would contribute to her cough.  She reports gradually worsening cough that began one year ago. Does not recall any preceding illness. Cough is unproductive and associated with hoarseness. Denies hemoptysis, wheezing or shortness of breath. Coughing occurs throughout the day and night. No association with weather changes. Has taken protonox and zantac without change. Zantac has been discontinued. Scheduled for pH monitoring.  Does not improve with OTC medications. No fevers, chills, weight loss. No rash. Reports headaches. Denies rhinorrhea. Has difficulty sleep.  No respiratory issues or asthma in personal or family history.  Environmental exposures: None  I have personally reviewed patient's past medical/family/social history, allergies, current medications.  Past Medical History:  Diagnosis Date  . Anxiety   . Arthritis   . Cancer [facility])    breast cancer left with mastectomy  . 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 Depression   . Diabetes mellitus    no meds.-diet controlled  . Diarrhea   . Diverticulitis    s/p colectomy in 1989-08-30  . GERD (gastroesophageal reflux disease)   . HOH (hard of  hearing)   . HTN (hypertension)    Does not see a cardiologist  . Hyperlipidemia   . Hypothyroidism   . IBS (irritable bowel syndrome)   . S/P colonoscopy 30-Aug-2001   minimal internal hemorrhoids, pancolonic diverticula, biopsy of rectum: lymphoplasmacytic microscopic colitis, colon biopsies normal  . S/P colonoscopy Aug 31, 2010   pancolonic diverticula, nl TI, external hemorrhoidal tag,/rectum bx no abnormalities  . S/P endoscopy 08-31-2010   normal esophagus, stenotic pyloric channel, TTS dilation  . Stroke ([place]) 08/10/2006   no residual     Family History  Problem Relation Age of Onset  . Stroke Mother        deceased after 3 strokes 08/31/1999  . Diabetes Father   . Stroke Maternal Grandmother        deceased after 3 strokes  . Stroke Sister        twin sister  . Colon cancer Neg Hx      Social History   Occupational History  . Occupation: retired    Fish farm manager: RETIRED  Tobacco Use  . Smoking status: Never Smoker  . Smokeless tobacco: Never Used  . Tobacco comment: Never smoked  Substance and Sexual Activity  . Alcohol use: Yes    Alcohol/week: 1.0 standard drinks    Types: 1 Glasses of wine per week    Comment: occassional wine  . Drug use: No  . Sexual activity: Never    No Known Allergies   Outpatient Medications Prior to Visit  Medication Sig Dispense Refill  . acetaminophen (TYLENOL) 500 MG tablet Take  500-1,000 mg by mouth every 6 (six) hours as needed. Pain     . aspirin 325 MG tablet Take 325 mg by mouth daily.    Marland Kitchen atorvastatin (LIPITOR) 10 MG tablet Take 10 mg by mouth daily after breakfast.    . cholestyramine (QUESTRAN) 4 g packet Take 1 packet by mouth at bedtime. 60 packet 3  . clidinium-chlordiazePOXIDE (LIBRAX) 5-2.5 MG capsule TAKE 1 CAPSULE BY MOUTH FOUR TIMES DAILY BEFORE MEALS AND AT BEDTIME AS NEEDED. (Patient taking differently: No sig reported) 40 capsule 1  . FLUoxetine (PROZAC) 20 MG capsule Take 20 mg by mouth daily.    Marland Kitchen levothyroxine (SYNTHROID,  LEVOTHROID) 50 MCG tablet Take 50 mcg by mouth daily before breakfast.     . loperamide (IMODIUM) 2 MG capsule Take 2 mg by mouth as needed for diarrhea or loose stools.    Marland Kitchen losartan (COZAAR) 100 MG tablet Take 100 mg by mouth daily.    . pantoprazole (PROTONIX) 40 MG tablet Take 40 mg by mouth 2 (two) times daily before a meal.    . Probiotic Product (ALIGN PO) Take 1 tablet by mouth daily.     Marland Kitchen topiramate (TOPAMAX) 25 MG tablet Take 25 mg by mouth daily.     Marland Kitchen UNABLE TO FIND Rx: L8000- Post Surgical Bras (Quantity: 6) B8466- Silicone Breast Prosthesis (Quantity: 1) Dx: 174.9; Left Mastectomy 1 each 0  . UNABLE TO FIND Rx: [id number]- Mastectomy Form, left (Quantity: 1) Dx: 174.9; Left mastectomy 1 each 0  . amoxicillin (AMOXIL) 875 MG tablet Take 875 mg by mouth 2 (two) times daily.    . RABEprazole (ACIPHEX) 20 MG tablet Take 1 tablet (20 mg total) by mouth daily. 90 tablet 3  . ALPRAZolam (XANAX) 0.5 MG tablet Take 0.5 mg by mouth 2 (two) times daily as needed for sleep or anxiety.     No facility-administered medications prior to visit.     Review of Systems  Constitutional: Positive for malaise/fatigue. Negative for chills and fever.  HENT: Positive for congestion. Negative for sinus pain.        Hoarseness  Respiratory: Positive for cough. Negative for hemoptysis, sputum production, shortness of breath and wheezing.   Cardiovascular: Negative for chest pain and leg swelling.  Gastrointestinal: Positive for heartburn. Negative for abdominal pain.  Genitourinary: Negative for frequency.  Musculoskeletal: Negative for myalgias.  Neurological: Positive for headaches. Negative for dizziness.     Objective:  Physical Exam   Vitals:   05/09/18 1046  BP: 140/62  Pulse: 64  SpO2: 99%  Weight: 113 lb 6.4 oz (51.4 kg)  Height: 5\' 1"  (1.549 m)   SpO2: 99 % O2 Device: None (Room air)  Physical Exam: General: Well-appearing, no acute distress HENT: [place], AT, OP clear, MMM Eyes:  EOMI, no scleral icterus Respiratory: Clear to auscultation bilaterally.  No crackles, wheezing or rales Cardiovascular: RRR, -M/R/G, no JVD GI: BS+, soft, nontender Extremities:-Edema,-tenderness Neuro: AAO x4, CNII-XII grossly intact Skin: Intact, no rashes or bruising Psych: Normal mood, normal affect  Chest imaging: None on file  PFT: None on file  I have personally reviewed the above labs, images and tests noted above.    Assessment & Plan:   Discussion: 84 year old female with DM2 and reflux who presents with chronic cough. Patient currently being evaluated for uncontrolled reflux. Other etiologies considered include upper airway cough syndrome and undiagnosed asthma.  #Chronic cough --START albuterol inhaler 2 puffs every 4 hours as needed for shortness  of breath or wheezing --ORDER for pulmonary function tests to evaluate for obstructive or restrictive lung disease  Follow-up with me in 3 weeks  Orders Placed This Encounter  Procedures  . Pulmonary function test    Standing Status:   Future    Standing Expiration Date:   05/10/2019    Order Specific Question:   Where should this test be performed?    Answer:   [facility]    Order Specific Question:   Full PFT: includes the following: basic spirometry, spirometry pre & post bronchodilator, diffusion capacity (DLCO), lung volumes    Answer:   Full PFT   Meds ordered this encounter  Medications  . albuterol (PROVENTIL HFA;VENTOLIN HFA) 108 (90 Base) MCG/ACT inhaler    Sig: Inhale 2 puffs into the lungs every 6 (six) hours as needed for wheezing or shortness of breath.    Dispense:  1 Inhaler    Refill:  3    Return in about 3 weeks (around 05/30/2018).   Thank you for choosing [facility] for your health needs!   Chi Rodman Pickle, MD [facility] 05/12/2018 7:08 PM  Personal pager: [phone] If unanswered, please page CCM On-call: [phone]

## 2018-05-09 NOTE — Patient Instructions (Signed)
#  Chronic cough --START albuterol inhaler 2 puffs every 4 hours as needed for shortness of breath or wheezing --ORDER for pulmonary function tests to evaluate for obstructive or restrictive lung disease  Follow-up with me in 3 weeks

## 2018-05-12 ENCOUNTER — Encounter: Payer: Self-pay | Admitting: Pulmonary Disease

## 2018-05-13 ENCOUNTER — Encounter (HOSPITAL_COMMUNITY)
Admission: RE | Disposition: A | Discharge status: Home / Self Care | Payer: Self-pay | Source: Home / Self Care | Attending: Gastroenterology

## 2018-05-13 ENCOUNTER — Ambulatory Visit (HOSPITAL_COMMUNITY)
Admission: RE | Admit: 2018-05-13 | Discharge: 2018-05-13 | Disposition: A | Discharge status: Home / Self Care | Payer: Medicare Other | Attending: Gastroenterology | Admitting: Gastroenterology

## 2018-05-13 DIAGNOSIS — K219 Gastro-esophageal reflux disease without esophagitis: Secondary | ICD-10-CM | POA: Diagnosis not present

## 2018-05-13 DIAGNOSIS — K589 Irritable bowel syndrome without diarrhea: Secondary | ICD-10-CM | POA: Insufficient documentation

## 2018-05-13 DIAGNOSIS — R05 Cough: Secondary | ICD-10-CM | POA: Diagnosis present

## 2018-05-13 DIAGNOSIS — R053 Chronic cough: Secondary | ICD-10-CM

## 2018-05-13 HISTORY — PX: PH IMPEDANCE STUDY: SHX5565

## 2018-05-13 HISTORY — PX: ESOPHAGEAL MANOMETRY: SHX5429

## 2018-05-13 SURGERY — MANOMETRY, ESOPHAGUS

## 2018-05-13 MED ORDER — LIDOCAINE VISCOUS HCL 2 % MT SOLN
OROMUCOSAL | Status: AC
  Filled 2018-05-13: qty 15

## 2018-05-13 SURGICAL SUPPLY — 2 items
FACESHIELD LNG OPTICON STERILE (SAFETY) IMPLANT
GLOVE BIO SURGEON STRL SZ8 (GLOVE) ×4 IMPLANT

## 2018-05-13 NOTE — Progress Notes (Signed)
Esophageal manometry performed per protocol.  Patient tolerated procedure without complications.  PH probe placed per protocol at 33cm.  Patient tolerated placement without complications.  Instructions on how to use monitor and journal given to patient.  Patient verbalized understanding.  Patient to return tomorrow at 0940 for probe to be removed.  Dr. Silverio Decamp to interpret results.

## 2018-05-15 ENCOUNTER — Encounter (HOSPITAL_COMMUNITY): Payer: Self-pay | Admitting: Gastroenterology

## 2018-05-26 DIAGNOSIS — R05 Cough: Secondary | ICD-10-CM

## 2018-05-26 DIAGNOSIS — R053 Chronic cough: Secondary | ICD-10-CM

## 2018-06-06 ENCOUNTER — Ambulatory Visit: Payer: Medicare Other | Admitting: Pulmonary Disease

## 2018-06-06 ENCOUNTER — Encounter: Payer: Self-pay | Admitting: Pulmonary Disease

## 2018-06-06 ENCOUNTER — Ambulatory Visit (INDEPENDENT_AMBULATORY_CARE_PROVIDER_SITE_OTHER): Payer: Medicare Other | Admitting: Pulmonary Disease

## 2018-06-06 VITALS — BP 118/60 | HR 71

## 2018-06-06 DIAGNOSIS — R942 Abnormal results of pulmonary function studies: Secondary | ICD-10-CM

## 2018-06-06 DIAGNOSIS — R05 Cough: Secondary | ICD-10-CM | POA: Diagnosis not present

## 2018-06-06 DIAGNOSIS — R053 Chronic cough: Secondary | ICD-10-CM

## 2018-06-06 DIAGNOSIS — R059 Cough, unspecified: Secondary | ICD-10-CM

## 2018-06-06 LAB — PULMONARY FUNCTION TEST
DL/VA % PRED: 98 %
DL/VA: 4.32 ml/min/mmHg/L
DLCO UNC % PRED: 56 %
DLCO UNC: 11.41 ml/min/mmHg
FEF 25-75 POST: 1.57 L/s
FEF 25-75 PRE: 1.7 L/s
FEF2575-%Change-Post: -7 %
FEF2575-%PRED-POST: 145 %
FEF2575-%PRED-PRE: 156 %
FEV1-%Change-Post: 0 %
FEV1-%Pred-Post: 90 %
FEV1-%Pred-Pre: 91 %
FEV1-Post: 1.42 L
FEV1-Pre: 1.42 L
FEV1FVC-%Change-Post: -2 %
FEV1FVC-%PRED-PRE: 113 %
FEV6-%Change-Post: 1 %
FEV6-%PRED-PRE: 85 %
FEV6-%Pred-Post: 86 %
FEV6-Post: 1.73 L
FEV6-Pre: 1.7 L
FEV6FVC-%CHANGE-POST: -1 %
FEV6FVC-%PRED-POST: 105 %
FEV6FVC-%Pred-Pre: 106 %
FVC-%Change-Post: 2 %
FVC-%PRED-POST: 82 %
FVC-%Pred-Pre: 80 %
FVC-PRE: 1.71 L
FVC-Post: 1.75 L
POST FEV1/FVC RATIO: 81 %
PRE FEV1/FVC RATIO: 83 %
Post FEV6/FVC ratio: 98 %
Pre FEV6/FVC Ratio: 100 %
RV % pred: 79 %
RV: 1.83 L
TLC % PRED: 79 %
TLC: 3.64 L

## 2018-06-06 MED ORDER — BENZONATATE 100 MG PO CAPS: 200.0000 mg | ORAL_CAPSULE | Freq: Three times a day (TID) | ORAL | 1 refills | Status: DC | PRN

## 2018-06-06 NOTE — Progress Notes (Signed)
Synopsis: Referred in 04/2018 for chronic cough  Subjective:   PATIENT ID: Angela Galvan: female DOB: 08/24/1932, MRN: [id number]   HPI  Chief Complaint  Patient presents with  . Follow-up    dry cough continues but albuterol does help- sneezing - had PFT today   [name] is a 85 year old female never smoker with type 2 diabetes, OA, GERD who presents for follow-up for chronic cough.  Last seen in clinic with me on 05/09/18 for gradually worsening unproductive cough over one year. Presents today to discuss her PFT results. Her cough still occurs throughout the day and night with some improvement with albuterol inhaler. Associated with hoarseness. She notices that aerosolized products such as hairsprays, cleaner sprays and perfumes aggravate her symptoms. For her reflux, takes Protonix once a day and eating small, frequent meals. Scheduled for pH monitoring. Previously followed by ENT - no significant sinusitis to contribute to cough  No fevers, chills, weight loss. No rash. Reports headaches. Denies rhinorrhea.  No wrist/ankle or isolated joint pain. No rashes.   Environmental exposures: None. 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   I have personally reviewed patient's past medical/family/social history, allergies, current medications.  Past Medical History:  Diagnosis Date  . Anxiety   . Arthritis   . Cancer [facility])    breast cancer left with mastectomy  . 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 Depression   . Diabetes mellitus    no meds.-diet controlled  . Diarrhea   . Diverticulitis    s/p colectomy in 09-06-1989  . GERD (gastroesophageal reflux disease)   . HOH (hard of hearing)   . HTN (hypertension)    Does not see a cardiologist  . Hyperlipidemia   . Hypothyroidism   . IBS (irritable bowel syndrome)   . S/P colonoscopy 09/06/2001   minimal internal hemorrhoids, pancolonic  diverticula, biopsy of rectum: lymphoplasmacytic microscopic colitis, colon biopsies normal  . S/P colonoscopy Sep 07, 2010   pancolonic diverticula, nl TI, external hemorrhoidal tag,/rectum bx no abnormalities  . S/P endoscopy 09-07-2010   normal esophagus, stenotic pyloric channel, TTS dilation  . Stroke ([place]) 08/10/2006   no residual     Family History  Problem Relation Age of Onset  . Stroke Mother        deceased after 3 strokes 1999-09-07  . Diabetes Father   . Stroke Maternal Grandmother        deceased after 3 strokes  . Stroke Sister        twin sister  . Colon cancer Neg Hx      Social History   Occupational History  . Occupation: retired    Fish farm manager: RETIRED  Tobacco Use  . Smoking status: Never Smoker  . Smokeless tobacco: Never Used  . Tobacco comment: Never smoked  Substance and Sexual Activity  . Alcohol use: Yes    Alcohol/week: 1.0 standard drinks    Types: 1 Glasses of wine per week    Comment: occassional wine  . Drug use: No  . Sexual activity: Never    No Known Allergies   Outpatient Medications Prior to Visit  Medication Sig Dispense Refill  . acetaminophen (TYLENOL) 500 MG tablet Take 500-1,000 mg by mouth every 6 (six) hours as needed. Pain     . albuterol (PROVENTIL HFA;VENTOLIN HFA) 108 (90 Base) MCG/ACT inhaler Inhale 2 puffs into the lungs every 6 (six)  hours as needed for wheezing or shortness of breath. 1 Inhaler 3  . ALPRAZolam (XANAX) 0.5 MG tablet Take 0.5 mg by mouth 2 (two) times daily as needed for sleep or anxiety.    Marland Kitchen aspirin 325 MG tablet Take 325 mg by mouth daily.    Marland Kitchen atorvastatin (LIPITOR) 10 MG tablet Take 10 mg by mouth daily after breakfast.    . cholestyramine (QUESTRAN) 4 g packet Take 1 packet by mouth at bedtime. 60 packet 3  . clidinium-chlordiazePOXIDE (LIBRAX) 5-2.5 MG capsule TAKE 1 CAPSULE BY MOUTH FOUR TIMES DAILY BEFORE MEALS AND AT BEDTIME AS NEEDED. (Patient taking differently: No sig reported) 40 capsule 1  . FLUoxetine  (PROZAC) 20 MG capsule Take 20 mg by mouth daily.    Marland Kitchen levothyroxine (SYNTHROID, LEVOTHROID) 50 MCG tablet Take 50 mcg by mouth daily before breakfast.     . loperamide (IMODIUM) 2 MG capsule Take 2 mg by mouth as needed for diarrhea or loose stools.    Marland Kitchen losartan (COZAAR) 100 MG tablet Take 100 mg by mouth daily.    . pantoprazole (PROTONIX) 40 MG tablet Take 40 mg by mouth 2 (two) times daily before a meal.    . Probiotic Product (ALIGN PO) Take 1 tablet by mouth daily.     Marland Kitchen topiramate (TOPAMAX) 25 MG tablet Take 25 mg by mouth daily.     Marland Kitchen UNABLE TO FIND Rx: L8000- Post Surgical Bras (Quantity: 6) J8563- Silicone Breast Prosthesis (Quantity: 1) Dx: 174.9; Left Mastectomy 1 each 0  . UNABLE TO FIND Rx: J4970- Mastectomy Form, left (Quantity: 1) Dx: 174.9; Left mastectomy 1 each 0   No facility-administered medications prior to visit.     Review of Systems  Constitutional: Positive for malaise/fatigue. Negative for chills, fever and weight loss.  HENT: Positive for congestion.        Hoarseness  Eyes: Negative for blurred vision.  Respiratory: Positive for cough. Negative for hemoptysis, sputum production, shortness of breath and wheezing.   Cardiovascular: Negative for chest pain and palpitations.  Gastrointestinal: Positive for heartburn. Negative for nausea.     Objective:    Vitals:   06/06/18 1332  BP: 118/60  Pulse: 71  SpO2: 97%   SpO2: 97 %  Physical Exam: General: Well-appearing, no acute distress HENT: , AT, OP clear, MMM Eyes: EOMI, no scleral icterus Lymph: no cervical lymphadenopathy Respiratory: Clear to auscultation bilaterally.  No crackles, wheezing or rales Cardiovascular: RRR, -M/R/G, no JVD GI: BS+, soft, nontender Extremities:-Edema,-tenderness Neuro: AAO x4, CNII-XII grossly intact Skin: Intact, no rashes or bruising Psych: Normal mood, normal affect GU: Foley in place  Chest imaging:  CXR 03/06/18 No acute infiltrate, effusion or  edema  PFT:  06/06/18- FEV1/FVC 83 FEV1 1.42 (90%) FVC 1.71 (80%). No sig BD. DLCO 56%.  Mild restrictive defect with reduced DLCO may represent early ILD  I have personally reviewed the above labs, images and tests noted above.    Assessment & Plan:   Chronic Cough Abnormal PFTs  Discussion: 85 year old female with DM2 and reflux who presents with chronic cough. Patient currently being evaluated for uncontrolled reflux. Reviewed PFTs in-clinic with patient. No obstructive defect however mild restrictive defect with moderately reduced DLCO present that is suggestive of early ILD. We discussed that this could represent interstitial lung disease but would need additional testing with CT imaging as the next step.  #Chronic cough --CT Chest without contrast --START tessalon perles as needed --CONTINUE home protonix 40  mg daily --CONTINUE albuterol inhaler 2 puffs every 4 hours as needed for shortness of breath or wheezing  Follow-up with me in 1 month  Orders Placed This Encounter  Procedures  . CT Chest Wo Contrast    Standing Status:   Future    Standing Expiration Date:   08/05/2019    Scheduling Instructions:     Schedule as soon as convenient for patient     ROV will be in one month to discuss CT    Order Specific Question:   ** REASON FOR EXAM (FREE TEXT)    Answer:   abnormal PFT    Order Specific Question:   Preferred imaging location?    Answer:   [facility]    Order Specific Question:   Radiology Contrast Protocol - do NOT remove file path    Answer:   [email or web address]   Meds ordered this encounter  Medications  . benzonatate (TESSALON PERLES) 100 MG capsule    Sig: Take 2 capsules (200 mg total) by mouth 3 (three) times daily as needed for cough.    Dispense:  20 capsule    Refill:  1    Return in about 1 month (around 07/07/2018).   Thank you for choosing [facility] for your health needs!   Chi Rodman Pickle, MD [facility] 06/09/2018 2:00 PM  Personal pager: [phone] If unanswered, please page CCM On-call: [phone]

## 2018-06-06 NOTE — Progress Notes (Signed)
PFT done today. 

## 2018-06-06 NOTE — Patient Instructions (Addendum)
Chronic Cough and Abnormal Pulmonary Function Test  --We will order CT Chest without contrast --CONTINUE albuterol inhaler as instructed --START tessalon perles PRN --CONTINUE Protonix 40 mg once a day --Avoid foods and drinks that cause heartburn   Heartburn Heartburn is a type of pain or discomfort that can happen in the throat or chest. It is often described as a burning pain. It may also cause a bad, acid-like taste in the mouth. Heartburn may feel worse when you lie down or bend over. It may be worse at night. It may be caused by stomach contents that move back up (reflux) into the tube that connects the mouth with the stomach (esophagus). Follow these instructions at home: Eating and drinking   Avoid certain foods and drinks as told by your doctor. This may include: ? Coffee and tea (with or without caffeine). ? Drinks that have alcohol. ? Energy drinks and sports drinks. ? Carbonated drinks or sodas. ? Chocolate and cocoa. ? Peppermint and mint flavorings. ? Garlic and onions. ? Horseradish. ? Spicy and acidic foods, such as:  Peppers.  Chili powder and curry powder.  Vinegar.  Hot sauces and BBQ sauce. ? Citrus fruit juices and citrus fruits, such as:  Oranges.  Lemons.  Limes. ? Tomato-based foods, such as:  Red sauce and pizza with red sauce.  Chili.  Salsa. ? Fried and fatty foods, such as:  Donuts.  [place] fries and potato chips.  High-fat dressings. ? High-fat meats, such as:  Hot dogs and sausage.  Rib eye steak.  Ham and bacon. ? High-fat dairy items, such as:  Whole milk.  Butter.  Cream cheese.  Eat small meals often. Avoid eating large meals.  Avoid drinking large amounts of liquid with your meals.  Avoid eating meals during the 2-3 hours before bedtime.  Avoid lying down right after you eat.  Do not exercise right after you eat. Lifestyle      If you are overweight, lose an amount of weight that is healthy for you.  Ask your doctor about a safe weight loss goal.  Do not use any products that contain nicotine or tobacco, including cigarettes, e-cigarettes, and chewing tobacco. These can make your symptoms worse. If you need help quitting, ask your doctor.  Wear loose clothes. Do not wear anything tight around your waist.  Raise (elevate) the head of your bed about 6 inches (15 cm) when you sleep.  Try to lower your stress. If you need help doing this, ask your doctor. General instructions  Pay attention to any changes in your symptoms.  Take over-the-counter and prescription medicines only as told by your doctor. ? Do not take aspirin, ibuprofen, or other NSAIDs unless your doctor says it is okay. ? Stop medicines only as told by your doctor.  Keep all follow-up visits as told by your doctor. This is important. Contact a doctor if:  You have new symptoms.  You lose weight and you do not know why it is happening.  You have trouble swallowing, or it hurts to swallow.  You have wheezing or a cough that keeps happening.  Your symptoms do not get better with treatment.  You have heartburn often for more than 2 weeks. Get help right away if:  You have pain in your arms, neck, jaw, teeth, or back.  You feel sweaty, dizzy, or light-headed.  You have chest pain or shortness of breath.  You throw up (vomit) and your throw up looks like blood or  coffee grounds.  Your poop (stool) is bloody or black. These symptoms may represent a serious problem that is an emergency. Do not wait to see if the symptoms will go away. Get medical help right away. Call your local emergency services (911 in the [place] Do not drive yourself to the hospital. Summary  Heartburn is a type of pain that can happen in the throat or chest. It can feel like a burning pain. It may also cause a bad, acid-like taste in the mouth.  You may need to avoid certain foods and drinks to help your symptoms. Ask your doctor what foods  and drinks you should avoid.  Take over-the-counter and prescription medicines only as told by your doctor. Do not take aspirin, ibuprofen, or other NSAIDs unless your doctor told you to do so.  Contact your doctor if your symptoms do not get better or they get worse. This information is not intended to replace advice given to you by your health care provider. Make sure you discuss any questions you have with your health care provider. Document Released: 01/25/2011 Document Revised: 10/15/2017 Document Reviewed: 10/15/2017 Elsevier Interactive Patient Education  2019 [employer].

## 2018-06-11 ENCOUNTER — Ambulatory Visit: Payer: Medicare Other | Admitting: Internal Medicine

## 2018-06-20 ENCOUNTER — Ambulatory Visit (HOSPITAL_COMMUNITY)
Admission: RE | Admit: 2018-06-20 | Discharge: 2018-06-20 | Disposition: A | Discharge status: Home / Self Care | Payer: Medicare Other | Source: Ambulatory Visit | Attending: Pulmonary Disease | Admitting: Pulmonary Disease

## 2018-06-20 DIAGNOSIS — R05 Cough: Secondary | ICD-10-CM | POA: Diagnosis present

## 2018-06-20 DIAGNOSIS — R059 Cough, unspecified: Secondary | ICD-10-CM

## 2018-07-02 ENCOUNTER — Ambulatory Visit: Payer: Medicare Other | Admitting: Internal Medicine

## 2018-07-02 ENCOUNTER — Encounter: Payer: Self-pay | Admitting: Internal Medicine

## 2018-07-02 VITALS — BP 112/62 | HR 77 | Temp 96.5°F | Ht 61.0 in | Wt 114.6 lb

## 2018-07-02 DIAGNOSIS — R059 Cough, unspecified: Secondary | ICD-10-CM

## 2018-07-02 DIAGNOSIS — K219 Gastro-esophageal reflux disease without esophagitis: Secondary | ICD-10-CM | POA: Diagnosis not present

## 2018-07-02 DIAGNOSIS — R05 Cough: Secondary | ICD-10-CM

## 2018-07-02 NOTE — Progress Notes (Signed)
Primary Care Physician:  Asencion Noble, MD Primary Gastroenterologist:  Dr.  Gala Romney  Pre-Procedure History & Physical: HPI:  Angela Galvan is a 85 y.o. female here for follow-up of a chronic cough/hoarseness.  She has been extensively evaluated from a GI standpoint.  She has not improved on a twice daily PPI therapy (most recently Protonix and rabeprazole previously).  Recent esophageal manometry, pH/impedance study in [place] demonstrated essentially no reflux during the study while remaining on twice daily PPI therapy.  Esophageal motility reportedly looked good as well. Prior EGD and barium esophagram demonstrated only a tiny Zenker's.  She has been seen by Dr. Janace Hoard with ENT.  She does not have any meal associated symptoms.  She is being followed by Dr. [name].  Recent chest CT reviewed.  She has symptoms basically all the time.  She lives at home and has not had the opportunity to reside outside of her home for any period of time to determine whether or not she has any allergens at home which may be implicated.  No recent allergy testing.  Past Medical History:  Diagnosis Date  . Anxiety   . Arthritis   . Cancer [facility])    breast cancer left with mastectomy  . 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 Depression   . Diabetes mellitus    no meds.-diet controlled  . Diarrhea   . Diverticulitis    s/p colectomy in 1991  . GERD (gastroesophageal reflux disease)   . HOH (hard of hearing)   . HTN (hypertension)    Does not see a cardiologist  . Hyperlipidemia   . Hypothyroidism   . IBS (irritable bowel syndrome)   . S/P colonoscopy 2003   minimal internal hemorrhoids, pancolonic diverticula, biopsy of rectum: lymphoplasmacytic microscopic colitis, colon biopsies normal  . S/P colonoscopy 2012   pancolonic diverticula, nl TI, external hemorrhoidal tag,/rectum bx no abnormalities  . S/P endoscopy  2012   normal esophagus, stenotic pyloric channel, TTS dilation  . Stroke ([place]) 08/10/2006   no residual    Past Surgical History:  Procedure Laterality Date  . ABDOMINAL HYSTERECTOMY  1983  . bilateral cataracts    . BIOPSY  03/23/2016   Procedure: BIOPSY;  Surgeon: Daneil Dolin, MD;  [facility];  Service: Endoscopy;;  colon   . BREAST BIOPSY Left 01/10/2013   Procedure: BREAST BIOPSY;  Surgeon: Jamesetta So, MD;  Location: [facility];  Service: General;  Laterality: Left;  . BREAST LUMPECTOMY Right 01/29/2015   Procedure: RIGHT BREAST LUMPECTOMY;  Surgeon: Fanny Skates, MD;  Location: [facility];  Service: General;  Laterality: Right;  . CHOLECYSTECTOMY    . COLECTOMY    . COLONOSCOPY  09/11/01   Minimal internal hemorrhoids, otherwise normal rectum . Scattered pancolonic diverticula (few) The colonic mucosa otherwise appeared normal status post biopsies and stool sampling as described above.  . COLONOSCOPY N/A 03/23/2016   Procedure: COLONOSCOPY;  Surgeon: Daneil Dolin, MD;  [facility];  Service: Endoscopy;  Laterality: N/A;  1115  . ESOPHAGEAL MANOMETRY N/A 05/13/2018   Procedure: ESOPHAGEAL MANOMETRY (EM);  Surgeon: Mauri Pole, MD;  [facility];  Service: Endoscopy;  Laterality: N/A;  Dr Julious Payer  . ESOPHAGOGASTRODUODENOSCOPY  10/11/2010   Normal esophagus, small hiatal hernia,Stenotic pyloric channel likely critical.  Likely contributing to some of the patient's symptoms, status  post TTS balloon dilation Normal D1-D3 status post biopsy.  D2-D3 atrophic-appearing gastric mucosa status post biopsy  . ileocolonoscopy  10/11/2010   external hemorrhoidal tag.  Normal rectum status post biopsy Pancolonic diverticula status post segmental biopsy.  Normal  terminal ileum  . JOINT REPLACEMENT Bilateral   . KNEE ARTHROPLASTY Right 2004  . KNEE SURGERY Bilateral    2004- [facility]- total; knee  . [place] IMPEDANCE STUDY N/A 05/13/2018    Procedure: [place] IMPEDANCE STUDY;  Surgeon: Mauri Pole, MD;  [facility];  Service: Endoscopy;  Laterality: N/A;  On PPI  . right ovarian tumor removed as teenager    . SIMPLE MASTECTOMY WITH AXILLARY SENTINEL NODE BIOPSY Left 03/07/2013   Procedure: TOTAL  MASTECTOMY WITH AXILLARY SENTINEL NODE BIOPSY;  Surgeon: Adin Hector, MD;  Location: [facility];  Service: General;  Laterality: Left;  . TOTAL KNEE ARTHROPLASTY  09/25/2011   Procedure: TOTAL KNEE ARTHROPLASTY;  Surgeon: Gearlean Alf, MD;  [facility];  Service: Orthopedics;  Laterality: Left;    Prior to Admission medications   Medication Sig Start Date End Date Taking? Authorizing Provider  acetaminophen (TYLENOL) 500 MG tablet Take 500-1,000 mg by mouth every 6 (six) hours as needed. Pain    Yes [provider]  albuterol (PROVENTIL HFA;VENTOLIN HFA) 108 (90 Base) MCG/ACT inhaler Inhale 2 puffs into the lungs every 6 (six) hours as needed for wheezing or shortness of breath. 05/09/18  Yes Margaretha Seeds, MD  ALPRAZolam Duanne Moron) 0.5 MG tablet Take 0.5 mg by mouth 2 (two) times daily as needed for sleep or anxiety.   Yes [provider]  aspirin 325 MG tablet Take 325 mg by mouth daily.   Yes [provider]  atorvastatin (LIPITOR) 10 MG tablet Take 10 mg by mouth daily after breakfast.   Yes [provider]  benzonatate (TESSALON PERLES) 100 MG capsule Take 2 capsules (200 mg total) by mouth 3 (three) times daily as needed for cough. 06/06/18  Yes Margaretha Seeds, MD  cholestyramine Lucrezia Starch) 4 g packet Take 1 packet by mouth at bedtime. 07/30/17  Yes Annitta Needs, NP  clidinium-chlordiazePOXIDE (LIBRAX) 5-2.5 MG capsule TAKE 1 CAPSULE BY MOUTH FOUR TIMES DAILY BEFORE MEALS AND AT BEDTIME AS NEEDED. Patient taking differently: No sig reported 08/24/16  Yes Carlis Stable, NP  FLUoxetine (PROZAC) 20 MG capsule Take 20 mg by mouth daily.   Yes [provider]  levothyroxine  (SYNTHROID, LEVOTHROID) 50 MCG tablet Take 50 mcg by mouth daily before breakfast.    Yes [provider]  loperamide (IMODIUM) 2 MG capsule Take 2 mg by mouth as needed for diarrhea or loose stools.   Yes [provider]  losartan (COZAAR) 100 MG tablet Take 100 mg by mouth daily.   Yes [provider]  pantoprazole (PROTONIX) 40 MG tablet Take 40 mg by mouth 2 (two) times daily before a meal.   Yes [provider]  Probiotic Product (ALIGN PO) Take 1 tablet by mouth daily.    Yes [provider]  topiramate (TOPAMAX) 25 MG tablet Take 25 mg by mouth daily.    Yes [provider]  UNABLE TO FIND Rx: [place] (Quantity: 6) P8099- Silicone Breast Prosthesis (Quantity: 1) Dx: 174.9; Left Mastectomy 05/01/13  Yes Alphonsa Overall, MD  UNABLE TO FIND Rx: [phone]- Mastectomy Form, left (Quantity: 1) Dx: 174.9; Left mastectomy 07/01/13  Yes Fanny Skates, MD    Allergies  as of 07/02/2018  . (No Known Allergies)    Family History  Problem Relation Age of Onset  . Stroke Mother        deceased after 3 strokes 14-Sep-1999  . Diabetes Father   . Stroke Maternal Grandmother        deceased after 3 strokes  . Stroke Sister        twin sister  . Colon cancer Neg Hx     Social History   Socioeconomic History  . Marital status: Married    Spouse name: Not on file  . Number of children: Not on file  . Years of education: Not on file  . Highest education level: Not on file  Occupational History  . Occupation: retired    Fish farm manager: RETIRED  Social Needs  . Financial resource strain: Not on file  . Food insecurity:    Worry: Not on file    Inability: Not on file  . Transportation needs:    Medical: Not on file    Non-medical: Not on file  Tobacco Use  . Smoking status: Never Smoker  . Smokeless tobacco: Never Used  . Tobacco comment: Never smoked  Substance and Sexual Activity  . Alcohol use: Yes    Alcohol/week: 1.0 standard  drinks    Types: 1 Glasses of wine per week    Comment: occassional wine  . Drug use: No  . Sexual activity: Never  Lifestyle  . Physical activity:    Days per week: Not on file    Minutes per session: Not on file  . Stress: Not on file  Relationships  . Social connections:    Talks on phone: Not on file    Gets together: Not on file    Attends religious service: Not on file    Active member of club or organization: Not on file    Attends meetings of clubs or organizations: Not on file    Relationship status: Not on file  . Intimate partner violence:    Fear of current or ex partner: Not on file    Emotionally abused: Not on file    Physically abused: Not on file    Forced sexual activity: Not on file  Other Topics Concern  . Not on file  Social History Narrative  . Not on file    Review of Systems: See HPI, otherwise negative ROS  Physical Exam: BP 112/62   Pulse 77   Temp (!) 96.5 F (35.8 C) (Oral)   Ht 5\' 1"  (1.549 m)   Wt 114 lb 9.6 oz (52 kg)   BMI 21.65 kg/m  General:   Alert,  pleasant and cooperative in NAD Lungs:  Clear throughout to auscultation.   No wheezes, crackles, or rhonchi. No acute distress. Heart:  Regular rate and rhythm; no murmurs, clicks, rubs,  or gallops. Abdomen: Non-distended, normal bowel sounds.  Soft and nontender without appreciable mass or hepatosplenomegaly.  Pulses:  Normal pulses noted. Extremities:  Without clubbing or edema.  Impression/Plan: Very pleasant 85 year old lady with chronic cough and hoarseness.  She is very frustrated by her persistent symptoms and is looking for answers.  She has been extensively evaluated here as well as by ear nose and throat and more recently pulmonary medicine.  We have now demonstrated no clinically significant reflux via pH/impedance while on twice daily PPI therapy.  I am now hard pressed to implicate any degree of reflux as a causative factor in producing her symptoms.  I  wonder if she is  having neuropathic symptoms.  In addition, I wonder if she is being exposed to an occult allergen at home.  Very, very small Zenker's diverticulum not likely has anything to do with her symptoms.  Symptoms not consistent.  She has been thoroughly evaluated by ENT.    Recommendations:  May continue Rabeprazole 20 mg twice daily (ran out of Protonix and has plenty of rabeprazole on hand).  In this clinical setting, either agent therapeutically equivalent).  I have asked her to continue PPI twice daily for continued acid suppression while her non-GI work-up continues.  Otherwise,  no further GI evaluation at this time  Follow-up with Dr. [name]  Office visit in 6 months  Keep appointment with Dr. Willey Blade     Notice: This dictation was prepared with Dragon dictation along with smaller phrase technology. Any transcriptional errors that result from this process are unintentional and may not be corrected upon review.

## 2018-07-02 NOTE — Patient Instructions (Addendum)
May continue Rabeprazole 20 mg twice daily  No further GI evaluation at this time  Follow-up with Dr. [name]  Ask Dr. [name] about occult home allergens as possibility  of ENT/pulmonary symptoms.  Office visit in 6 months  Keep appointment with Dr. Willey Blade

## 2018-07-04 ENCOUNTER — Ambulatory Visit: Payer: Medicare Other | Admitting: Pulmonary Disease

## 2018-07-04 ENCOUNTER — Encounter: Payer: Self-pay | Admitting: Pulmonary Disease

## 2018-07-04 VITALS — BP 118/68 | HR 59 | Ht 60.0 in | Wt 115.2 lb

## 2018-07-04 DIAGNOSIS — R05 Cough: Secondary | ICD-10-CM

## 2018-07-04 DIAGNOSIS — R053 Chronic cough: Secondary | ICD-10-CM

## 2018-07-04 MED ORDER — FLUTICASONE FUROATE-VILANTEROL 100-25 MCG/INH IN AEPB: 1.0000 | INHALATION_SPRAY | Freq: Every day | RESPIRATORY_TRACT | 0 refills | Status: DC

## 2018-07-04 MED ORDER — GABAPENTIN 300 MG PO CAPS: 300.0000 mg | ORAL_CAPSULE | Freq: Three times a day (TID) | ORAL | 3 refills | Status: DC | PRN

## 2018-07-04 NOTE — Progress Notes (Signed)
Patient seen in the office today and instructed on use of Breo Ellipta 100/25.  Patient expressed understanding and demonstrated technique.

## 2018-07-04 NOTE — Progress Notes (Signed)
Synopsis: Referred in 04/2018 for chronic cough  Subjective:   PATIENT ID: Angela Galvan: female DOB: 06/08/1932, MRN: [id number]   HPI  Chief Complaint  Patient presents with  . Follow-up    follows for chronic cough   [name] is a 85 year old female never smoker with type 2 diabetes, OA, GERD who presents for follow-up for chronic cough.  Last seen in clinic with me on 05/09/18 for gradually worsening unproductive cough over one year. Presents today to discuss her PFT results. Her cough still occurs throughout the day and night with some improvement with albuterol inhaler. Associated with hoarseness. She notices that aerosolized products such as hairsprays, cleaner sprays and perfumes aggravate her symptoms. For her reflux, takes Protonix once a day and eating small, frequent meals. Esophageal and PH test were normal. Previously followed by ENT - no significant sinusitis to contribute to cough  Denies fevers, chills, chest pain, shortness of breath, wheezing, hemoptysis, nausea, heartburn dysphagia.   Environmental exposures: None. 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   I have personally reviewed patient's past medical/family/social history/allergies/current medications.  Past Medical History:  Diagnosis Date  . Anxiety   . Arthritis   . Cancer [facility])    breast cancer left with mastectomy  . 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 Depression   . Diabetes mellitus    no meds.-diet controlled  . Diarrhea   . Diverticulitis    s/p colectomy in Sep 04, 1989  . GERD (gastroesophageal reflux disease)   . HOH (hard of hearing)   . HTN (hypertension)    Does not see a cardiologist  . Hyperlipidemia   . Hypothyroidism   . IBS (irritable bowel syndrome)   . S/P colonoscopy 2001-09-04   minimal internal hemorrhoids, pancolonic diverticula, biopsy of rectum: lymphoplasmacytic microscopic colitis,  colon biopsies normal  . S/P colonoscopy September 05, 2010   pancolonic diverticula, nl TI, external hemorrhoidal tag,/rectum bx no abnormalities  . S/P endoscopy September 05, 2010   normal esophagus, stenotic pyloric channel, TTS dilation  . Stroke ([place]) 08/10/2006   no residual     Family History  Problem Relation Age of Onset  . Stroke Mother        deceased after 3 strokes 09/05/1999  . Diabetes Father   . Stroke Maternal Grandmother        deceased after 3 strokes  . Stroke Sister        twin sister  . Colon cancer Neg Hx      Social History   Occupational History  . Occupation: retired    Fish farm manager: RETIRED  Tobacco Use  . Smoking status: Never Smoker  . Smokeless tobacco: Never Used  . Tobacco comment: Never smoked  Substance and Sexual Activity  . Alcohol use: Yes    Alcohol/week: 1.0 standard drinks    Types: 1 Glasses of wine per week    Comment: occassional wine  . Drug use: No  . Sexual activity: Never    No Known Allergies   Outpatient Medications Prior to Visit  Medication Sig Dispense Refill  . acetaminophen (TYLENOL) 500 MG tablet Take 500-1,000 mg by mouth every 6 (six) hours as needed. Pain     . albuterol (PROVENTIL HFA;VENTOLIN HFA) 108 (90 Base) MCG/ACT inhaler Inhale 2 puffs into the lungs every 6 (six) hours as needed for wheezing or shortness of breath. 1 Inhaler 3  . ALPRAZolam (  XANAX) 0.5 MG tablet Take 0.5 mg by mouth 2 (two) times daily as needed for sleep or anxiety.    Marland Kitchen aspirin 325 MG tablet Take 325 mg by mouth daily.    Marland Kitchen atorvastatin (LIPITOR) 10 MG tablet Take 10 mg by mouth daily after breakfast.    . benzonatate (TESSALON PERLES) 100 MG capsule Take 2 capsules (200 mg total) by mouth 3 (three) times daily as needed for cough. 20 capsule 1  . cholestyramine (QUESTRAN) 4 g packet Take 1 packet by mouth at bedtime. 60 packet 3  . clidinium-chlordiazePOXIDE (LIBRAX) 5-2.5 MG capsule TAKE 1 CAPSULE BY MOUTH FOUR TIMES DAILY BEFORE MEALS AND AT BEDTIME AS NEEDED.  (Patient taking differently: No sig reported) 40 capsule 1  . FLUoxetine (PROZAC) 20 MG capsule Take 20 mg by mouth daily.    . fluticasone (FLONASE) 50 MCG/ACT nasal spray Place into both nostrils daily.    Marland Kitchen levothyroxine (SYNTHROID, LEVOTHROID) 50 MCG tablet Take 50 mcg by mouth daily before breakfast.     . loperamide (IMODIUM) 2 MG capsule Take 2 mg by mouth as needed for diarrhea or loose stools.    Marland Kitchen loratadine (CLARITIN) 10 MG tablet Take 10 mg by mouth daily.    Marland Kitchen losartan (COZAAR) 100 MG tablet Take 100 mg by mouth daily.    . pantoprazole (PROTONIX) 40 MG tablet Take 40 mg by mouth 2 (two) times daily before a meal.    . Probiotic Product (ALIGN PO) Take 1 tablet by mouth daily.     Marland Kitchen topiramate (TOPAMAX) 25 MG tablet Take 25 mg by mouth daily.     Marland Kitchen UNABLE TO FIND Rx: L8000- Post Surgical Bras (Quantity: 6) J4970- Silicone Breast Prosthesis (Quantity: 1) Dx: 174.9; Left Mastectomy 1 each 0  . UNABLE TO FIND Rx: Y6378- Mastectomy Form, left (Quantity: 1) Dx: 174.9; Left mastectomy 1 each 0   No facility-administered medications prior to visit.     Review of Systems  Constitutional: Negative for chills and fever.  HENT: Positive for congestion. Negative for sinus pain.        Hoarseness  Respiratory: Positive for cough. Negative for sputum production, shortness of breath and wheezing.   Cardiovascular: Negative for chest pain and palpitations.  Gastrointestinal: Negative for abdominal pain, heartburn and nausea.  Neurological: Negative for weakness.  Endo/Heme/Allergies: Positive for environmental allergies.     Objective:    Vitals:   07/04/18 1323  BP: 118/68  Pulse: (!) 59  SpO2: 96%  Weight: 115 lb 3.2 oz (52.3 kg)  Height: 5' (1.524 m)   SpO2: 96 % O2 Device: None (Room air)  Physical Exam: General: Well-appearing, no acute distress HENT: [place], AT, OP clear, MMM Eyes: EOMI, no scleral icterus Respiratory: Clear to auscultation bilaterally.  No crackles,  wheezing or rales Cardiovascular: RRR, -M/R/G, no JVD GI: BS+, soft, nontender Extremities:-Edema,-tenderness Neuro: AAO x4, CNII-XII grossly intact Skin: Intact, no rashes or bruising Psych: Normal mood, normal affect  Chest imaging:  CXR 03/06/18 No acute infiltrate, effusion or edema  CT Chest 06/20/18 Normal lung airways and parenchyma. No infiltrate, effusion or edema.  PFT:  06/06/18- FEV1/FVC 83 FEV1 1.42 (90%) FVC 1.71 (80%). No sig BD. DLCO 56%.  Mild restrictive defect with reduced DLCO may represent early ILD  Esophageal Manometry 05/13/18: No esophageal abnormality  PH Study 05/13/18: Good acid suppression on PPI  I have personally reviewed the above labs, images and tests noted above.    Assessment & Plan:  Chronic Cough Abnormal PFTs  Discussion: 85 year old female with DM2 and reflux who presents with chronic cough. Initial ddx included underlying lung defect (PFTs with mild restriction however CT without evidence of ILD), reflux (PH and esophageal testing normal) and UACS (patient denies rhinorrhea but does have mild congestion and hoarseness). Can also consider non-asthmatic eosinophilic bronchitis.  Chronic Cough Our plan is for symptom control.  --START Breo inhaler 1 inhalation daily for one month. Please call our office to order if inhaler improves your symptoms. --If cough is unchanged after one month--> START Gabapentin 300 mg once a day. This can be taken up to three times a day as tolerated. --Take nasal spray and allergy medication as noted below  Rhinorrhea --START Flonase nasal spray. Administer 1 spray per nostril daily --START Loratadine (Claritin) 10 mg daily Both these medications can be purchased over the counter at your local pharmacy  No orders of the defined types were placed in this encounter.  Meds ordered this encounter  Medications  . fluticasone furoate-vilanterol (BREO ELLIPTA) 100-25 MCG/INH AEPB    Sig: Inhale 1 puff into  the lungs daily.    Dispense:  2 each    Refill:  0    Order Specific Question:   Lot Number?    Answer:   [id number]    Order Specific Question:   Expiration Date?    Answer:   06/04/2019    Order Specific Question:   Manufacturer?    Answer:   [employer] [[id number]]    Order Specific Question:   Quantity    Answer:   2    Comments:   samples  . gabapentin (NEURONTIN) 300 MG capsule    Sig: Take 1 capsule (300 mg total) by mouth 3 (three) times daily as needed.    Dispense:  90 capsule    Refill:  3    Return in about 1 month (around 08/02/2018).  Devian Bartolomei Rodman Pickle, MD [facility] 07/07/2018 10:46 AM  Personal pager: [phone] If unanswered, please page CCM On-call: [phone]

## 2018-07-04 NOTE — Patient Instructions (Addendum)
Chronic Cough Our plan is for symptom control.  --START Breo inhaler 1 inhalation daily for one month. Please call our office to order if inhaler improves your symptoms. --If cough is unchanged after one month--> START Gabapentin 300 mg once a day. This can be taken up to three times a day as tolerated. --Take nasal spray and allergy medication as noted below  Rhinorrhea --START Flonase nasal spray. Administer 1 spray per nostril daily --START Loratadine (Claritin) 10 mg daily Both these medications can be purchased over the counter at your local pharmacy   Follow-up with me in 2 months

## 2018-07-09 ENCOUNTER — Other Ambulatory Visit: Payer: Self-pay | Admitting: Nurse Practitioner

## 2018-07-23 ENCOUNTER — Telehealth: Payer: Self-pay | Admitting: Pulmonary Disease

## 2018-07-23 NOTE — Telephone Encounter (Signed)
Spoke with pt, she states the [employer] helped some but was wondering if she should start the gabapentin. She still has a bad cough and feels really anxious. Dr. [name] please advise if you would like her to start the gabapentin.    Patient Instructions by Margaretha Seeds, MD at 07/04/2018 1:45 PM  Author: Margaretha Seeds, MD Author Type: Physician Filed: 07/04/2018 2:13 PM  Note Status: Addendum Cosign: Cosign Not Required Encounter Date: 07/04/2018  Editor: Margaretha Seeds, MD (Physician)  Prior Versions: 1. Margaretha Seeds, MD (Physician) at 07/04/2018 2:06 PM - Signed    Chronic Cough Our plan is for symptom control.  --START Breo inhaler 1 inhalation daily for one month. Please call our office to order if inhaler improves your symptoms. --If cough is unchanged after one month--> START Gabapentin 300 mg once a day. This can be taken up to three times a day as tolerated. --Take nasal spray and allergy medication as noted below

## 2018-07-24 MED ORDER — FLUTICASONE FUROATE-VILANTEROL 100-25 MCG/INH IN AEPB: 1.0000 | INHALATION_SPRAY | Freq: Every day | RESPIRATORY_TRACT | 3 refills | Status: DC

## 2018-07-24 NOTE — Telephone Encounter (Signed)
Yes, please order gabapentin 300 mg BID PRN. In the instructions: Start taking 300 mg daily. If able to tolerate medication, you can increase to 300 mg twice a day as needed for cough.

## 2018-07-24 NOTE — Telephone Encounter (Signed)
Called pt and advised message from the provider. Pt understood and verbalized understanding. Nothing further is needed.  Rx for [facility] was sent as well.

## 2018-07-30 LAB — TSH: TSH: 0.65 (ref 0.41–5.90)

## 2018-08-01 ENCOUNTER — Emergency Department (HOSPITAL_COMMUNITY)
Admission: EM | Admit: 2018-08-01 | Discharge: 2018-08-01 | Disposition: A | Discharge status: Home / Self Care | Payer: Medicare Other | Attending: Emergency Medicine | Admitting: Emergency Medicine

## 2018-08-01 ENCOUNTER — Encounter (HOSPITAL_COMMUNITY): Payer: Self-pay | Admitting: Emergency Medicine

## 2018-08-01 ENCOUNTER — Other Ambulatory Visit: Payer: Self-pay

## 2018-08-01 ENCOUNTER — Ambulatory Visit (HOSPITAL_COMMUNITY)
Admission: EM | Admit: 2018-08-01 | Discharge: 2018-08-01 | Disposition: A | Discharge status: Home / Self Care | Payer: Medicare Other | Source: Home / Self Care | Attending: Family Medicine | Admitting: Family Medicine

## 2018-08-01 ENCOUNTER — Emergency Department (HOSPITAL_COMMUNITY): Payer: Medicare Other

## 2018-08-01 DIAGNOSIS — W19XXXA Unspecified fall, initial encounter: Secondary | ICD-10-CM | POA: Insufficient documentation

## 2018-08-01 DIAGNOSIS — S0181XA Laceration without foreign body of other part of head, initial encounter: Secondary | ICD-10-CM

## 2018-08-01 DIAGNOSIS — E119 Type 2 diabetes mellitus without complications: Secondary | ICD-10-CM | POA: Diagnosis not present

## 2018-08-01 DIAGNOSIS — E039 Hypothyroidism, unspecified: Secondary | ICD-10-CM | POA: Insufficient documentation

## 2018-08-01 DIAGNOSIS — Y9301 Activity, walking, marching and hiking: Secondary | ICD-10-CM | POA: Insufficient documentation

## 2018-08-01 DIAGNOSIS — S61212A Laceration without foreign body of right middle finger without damage to nail, initial encounter: Secondary | ICD-10-CM | POA: Diagnosis present

## 2018-08-01 DIAGNOSIS — Y999 Unspecified external cause status: Secondary | ICD-10-CM | POA: Diagnosis not present

## 2018-08-01 DIAGNOSIS — I10 Essential (primary) hypertension: Secondary | ICD-10-CM | POA: Diagnosis not present

## 2018-08-01 DIAGNOSIS — Z79899 Other long term (current) drug therapy: Secondary | ICD-10-CM | POA: Diagnosis not present

## 2018-08-01 DIAGNOSIS — Y929 Unspecified place or not applicable: Secondary | ICD-10-CM | POA: Diagnosis not present

## 2018-08-01 DIAGNOSIS — S61312A Laceration without foreign body of right middle finger with damage to nail, initial encounter: Secondary | ICD-10-CM

## 2018-08-01 MED ORDER — BACITRACIN ZINC 500 UNIT/GM EX OINT
TOPICAL_OINTMENT | Freq: Once | CUTANEOUS | Status: AC
  Administered 2018-08-01: 1 via TOPICAL

## 2018-08-01 MED ORDER — BUPIVACAINE HCL 0.5 % IJ SOLN
20.0000 mL | Freq: Once | INTRAMUSCULAR | Status: AC
  Administered 2018-08-01: 20 mL
  Filled 2018-08-01: qty 20

## 2018-08-01 NOTE — ED Notes (Signed)
Patient transported to X-ray 

## 2018-08-01 NOTE — ED Notes (Signed)
Pt has been advised by Dr. Marcille Blanco that she needs to go the Er for her finger laceration.

## 2018-08-01 NOTE — ED Triage Notes (Signed)
Pt co laceration  Right hand  Middle finger this happened around 4 pm. Pt states she was bring trash cans in from the street.  Pt states some how she fell and   Has a laceration to her middle finger  And her nail came off. Pt has some bursing to her right knee.

## 2018-08-01 NOTE — ED Triage Notes (Signed)
Patient reports injury to R middle finger after mechanical fall while trying to pull 2 big trash cans back up to her house. Not on blood thinner, denies hitting head or LOC. Abrasion to R knee, but patient denies pain and is ambulatory with steady gait. R fingers have pressure bandage on.

## 2018-08-01 NOTE — ED Triage Notes (Signed)
Pt went to x-ray.

## 2018-08-01 NOTE — ED Notes (Signed)
ED Provider at bedside. 

## 2018-08-01 NOTE — ED Provider Notes (Signed)
[facility] EMERG[facility] DEPARTMENT Provider Note   CSN: [id number] Arrival date & time: 08/01/18  1802    History   Chief Complaint Chief Complaint  Patient presents with  . Finger Injury    HPI Angela Galvan is a 85 y.o. female who presents for evaluation of right 3rd finger laceration after a mechanical fall that occurred at approximately 4pm this afternoon. Patient reports she was walking with two trash cans and thinks they started going towards each other, causing her to fall. Patient states she thinks her hand got caught on the garbage can. Patient states she did not hit her head and denies any LOC. She is not currently on any blood thinners. She reports her tetanus shot was last year. Patient states that she can move the finger without any difficulty. Patient denies any neck or back pain, numbness/weakness.      The history is provided by the patient.    Past Medical History:  Diagnosis Date  . Anxiety   . Arthritis   . Cancer [facility])    breast cancer left with mastectomy  . 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 Depression   . Diabetes mellitus    no meds.-diet controlled  . Diarrhea   . Diverticulitis    s/p colectomy in 1991  . GERD (gastroesophageal reflux disease)   . HOH (hard of hearing)   . HTN (hypertension)    Does not see a cardiologist  . Hyperlipidemia   . Hypothyroidism   . IBS (irritable bowel syndrome)   . S/P colonoscopy 2003   minimal internal hemorrhoids, pancolonic diverticula, biopsy of rectum: lymphoplasmacytic microscopic colitis, colon biopsies normal  . S/P colonoscopy 2012   pancolonic diverticula, nl TI, external hemorrhoidal tag,/rectum bx no abnormalities  . S/P endoscopy 2012   normal esophagus, stenotic pyloric channel, TTS dilation  . Stroke ([place]) 08/10/2006   no residual    Patient Active Problem List   Diagnosis Date Noted    . Chronic cough   . Syncope and collapse 11/03/2016  . Type 2 diabetes mellitus without complication ([place])   . Breast mass, right 01/29/2015  . IBS (irritable bowel syndrome) 10/29/2014  . 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/07/2013  . Paget's disease of left breast ([place]) 01/24/2013  . Psychophysiologic insomnia 06/20/2012  . 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 Stiffness of joint, not elsewhere classified, lower leg 10/24/2011  . Difficulty in walking(719.7) 10/24/2011  . Pain in joint, lower leg 10/24/2011  . OA (osteoarthritis) of knee 09/25/2011  . GERD (gastroesophageal reflux disease) 09/15/2011  . Epigastric pain 09/29/2010  . Diarrhea 09/29/2010    Past Surgical History:  Procedure Laterality Date  . ABDOMINAL HYSTERECTOMY  1983  . bilateral cataracts    . BIOPSY  03/23/2016   Procedure: BIOPSY;  Surgeon: Daneil Dolin, MD;  [facility];  Service: Endoscopy;;  colon   . BREAST BIOPSY Left 01/10/2013   Procedure: BREAST BIOPSY;  Surgeon: Jamesetta So, MD;  Location: [facility] OR[facility];  Service: General;  Laterality: Left;  . BREAST LUMPECTOMY Right 01/29/2015   Procedure: RIGHT BREAST LUMPECTOMY;  Surgeon: Fanny Skates, MD;  Location: [facility];  Service: General;  Laterality: Right;  . CHOLECYSTECTOMY    . COLECTOMY    . COLONOSCOPY  09/11/01   Minimal internal hemorrhoids, otherwise normal rectum .  Scattered pancolonic diverticula (few) The colonic mucosa otherwise appeared normal status post biopsies and stool sampling as described above.  . COLONOSCOPY N/A 03/23/2016   Procedure: COLONOSCOPY;  Surgeon: Daneil Dolin, MD;  [facility];  Service: Endoscopy;  Laterality: N/A;  1115  . ESOPHAGEAL MANOMETRY N/A 05/13/2018   Procedure: ESOPHAGEAL MANOMETRY (EM);  Surgeon: Mauri Pole, MD;  [facility];  Service: Endoscopy;  Laterality: N/A;  Dr Julious Payer  . ESOPHAGOGASTRODUODENOSCOPY  10/11/2010   Normal  esophagus, small hiatal hernia,Stenotic pyloric channel likely critical.  Likely contributing to some of the patient's symptoms, status post TTS balloon dilation Normal D1-D3 status post biopsy.  D2-D3 atrophic-appearing gastric mucosa status post biopsy  . ileocolonoscopy  10/11/2010   external hemorrhoidal tag.  Normal rectum status post biopsy Pancolonic diverticula status post segmental biopsy.  Normal  terminal ileum  . JOINT REPLACEMENT Bilateral   . KNEE ARTHROPLASTY Right 2004  . KNEE SURGERY Bilateral    2004- [facility]- total; knee  . [place] IMPEDANCE STUDY N/A 05/13/2018   Procedure: [place] IMPEDANCE STUDY;  Surgeon: Mauri Pole, MD;  [facility];  Service: Endoscopy;  Laterality: N/A;  On PPI  . right ovarian tumor removed as teenager    . SIMPLE MASTECTOMY WITH AXILLARY SENTINEL NODE BIOPSY Left 03/07/2013   Procedure: TOTAL  MASTECTOMY WITH AXILLARY SENTINEL NODE BIOPSY;  Surgeon: Adin Hector, MD;  Location: [facility];  Service: General;  Laterality: Left;  . TOTAL KNEE ARTHROPLASTY  09/25/2011   Procedure: TOTAL KNEE ARTHROPLASTY;  Surgeon: Gearlean Alf, MD;  [facility];  Service: Orthopedics;  Laterality: Left;     OB History   No obstetric history on file.      Home Medications    Prior to Admission medications   Medication Sig Start Date End Date Taking? Authorizing Provider  acetaminophen (TYLENOL) 500 MG tablet Take 500-1,000 mg by mouth every 6 (six) hours as needed. Pain     [provider]  albuterol (PROVENTIL HFA;VENTOLIN HFA) 108 (90 Base) MCG/ACT inhaler Inhale 2 puffs into the lungs every 6 (six) hours as needed for wheezing or shortness of breath. 05/09/18   Margaretha Seeds, MD  ALPRAZolam Duanne Moron) 0.5 MG tablet Take 0.5 mg by mouth 2 (two) times daily as needed for sleep or anxiety.    [provider]  aspirin 325 MG tablet Take 325 mg by mouth daily.    [provider]  atorvastatin (LIPITOR) 10 MG tablet Take 10  mg by mouth daily after breakfast.    [provider]  benzonatate (TESSALON PERLES) 100 MG capsule Take 2 capsules (200 mg total) by mouth 3 (three) times daily as needed for cough. 06/06/18   Margaretha Seeds, MD  cholestyramine (QUESTRAN) 4 g packet MIX 1 PACKET IN 4-8 OUNCES OF BEVERAGE & DRINK AT BEDTIME AS DIRECTED. DO NOT TAKE WITHIN 2 HOURS OF OTHER MEDS. 07/11/18   Mahala Menghini, PA-C  clidinium-chlordiazePOXIDE (LIBRAX) 5-2.5 MG capsule TAKE 1 CAPSULE BY MOUTH FOUR TIMES DAILY BEFORE MEALS AND AT BEDTIME AS NEEDED. Patient taking differently: No sig reported 08/24/16   Carlis Stable, NP  FLUoxetine (PROZAC) 20 MG capsule Take 20 mg by mouth daily.    [provider]  fluticasone (FLONASE) 50 MCG/ACT nasal spray Place into both nostrils daily.    [provider]  fluticasone furoate-vilanterol (BREO ELLIPTA) 100-25 MCG/INH AEPB Inhale 1 puff into the lungs daily. 07/24/18   [name],  Darlis Loan, MD  gabapentin (NEURONTIN) 300 MG capsule Take 1 capsule (300 mg total) by mouth 3 (three) times daily as needed. 07/04/18   Margaretha Seeds, MD  levothyroxine (SYNTHROID, LEVOTHROID) 50 MCG tablet Take 50 mcg by mouth daily before breakfast.     [provider]  loperamide (IMODIUM) 2 MG capsule Take 2 mg by mouth as needed for diarrhea or loose stools.    [provider]  loratadine (CLARITIN) 10 MG tablet Take 10 mg by mouth daily.    [provider]  losartan (COZAAR) 100 MG tablet Take 100 mg by mouth daily.    [provider]  pantoprazole (PROTONIX) 40 MG tablet Take 40 mg by mouth 2 (two) times daily before a meal.    [provider]  Probiotic Product (ALIGN PO) Take 1 tablet by mouth daily.     [provider]  topiramate (TOPAMAX) 25 MG tablet Take 25 mg by mouth daily.     [provider]  UNABLE TO FIND Rx: [place] (Quantity: 6) X3235- Silicone Breast Prosthesis (Quantity: 1) Dx:  174.9; Left Mastectomy 05/01/13   Alphonsa Overall, MD  UNABLE TO FIND Rx: [phone]- Mastectomy Form, left (Quantity: 1) Dx: 174.9; Left mastectomy 07/01/13   Fanny Skates, MD    Family History Family History  Problem Relation Age of Onset  . Stroke Mother        deceased after 3 strokes 1999-09-16  . Diabetes Father   . Stroke Maternal Grandmother        deceased after 3 strokes  . Stroke Sister        twin sister  . Colon cancer Neg Hx     Social History Social History   Tobacco Use  . Smoking status: Never Smoker  . Smokeless tobacco: Never Used  . Tobacco comment: Never smoked  Substance Use Topics  . Alcohol use: Yes    Alcohol/week: 1.0 standard drinks    Types: 1 Glasses of wine per week    Comment: occassional wine  . Drug use: No     Allergies   Patient has no known allergies.   Review of Systems Review of Systems  Skin: Positive for wound.  Neurological: Negative for weakness and numbness.  All other systems reviewed and are negative.    Physical Exam Updated Vital Signs BP (!) 157/87 (BP Location: Right Arm)   Pulse (!) 58   Temp 98 F (36.7 C) (Oral)   Resp 16   SpO2 99%   Physical Exam Vitals signs and nursing note reviewed.  Constitutional:      Appearance: She is well-developed.  HENT:     Head: Normocephalic and atraumatic.  Eyes:     General: No scleral icterus.       Right eye: No discharge.        Left eye: No discharge.     Conjunctiva/sclera: Conjunctivae normal.  Cardiovascular:     Pulses:          Radial pulses are 2+ on the right side and 2+ on the left side.  Pulmonary:     Effort: Pulmonary effort is normal.  Musculoskeletal:     Comments: Flexion/tension of right third digit intact without any difficulty.  She can fully extend and flex the PIP and DIP when held in isolation.  She can easily make a fist.  No tenderness palpation noted to palm or wrist.  Skin:    General: Skin is warm  and dry.     Capillary Refill: Capillary  refill takes less than 2 seconds.     Comments: 1.5 cm curvilinear laceration noted to the dorsal aspect of the right third digit just proximal to to the PIP.  It does not extend over the PIP joint.  Right third digit nail is partially removed. Good distal cap refill.  RUE is not dusky in appearance or cool to touch.  Neurological:     Mental Status: She is alert.     Comments: Sensation intact along major nerve distributions of BUE  Psychiatric:        Speech: Speech normal.        Behavior: Behavior normal.      ED Treatments / Results  Labs (all labs ordered are listed, but only abnormal results are displayed) Labs Reviewed - No data to display  EKG None  Radiology Dg Finger Middle Right  Result Date: 08/01/2018 CLINICAL DATA:  Laceration EXAM: RIGHT MIDDLE FINGER 2+V COMPARISON:  None. FINDINGS: No fracture or malalignment. No radiopaque foreign body. Laceration dorsal soft tissues overlying the distal phalanx. IMPRESSION: No acute osseous abnormality Electronically Signed   By: Donavan Foil M.D.   On: 08/01/2018 19:04    Procedures .Marland KitchenLaceration Repair Date/Time: 08/01/2018 8:04 PM Performed by: Volanda Napoleon, PA-C Authorized by: Volanda Napoleon, PA-C   Consent:    Consent obtained:  Verbal   Consent given by:  Patient   Risks discussed:  Infection, need for additional repair, pain, poor cosmetic result and poor wound healing   Alternatives discussed:  No treatment and delayed treatment Universal protocol:    Procedure explained and questions answered to patient or proxy's satisfaction: yes     Relevant documents present and verified: yes     Test results available and properly labeled: yes     Imaging studies available: yes     Required blood products, implants, devices, and special equipment available: yes     Site/side marked: yes     Immediately prior to procedure, a time out was called: yes     Patient identity confirmed:  Verbally with patient Anesthesia  (see MAR for exact dosages):    Anesthesia method:  Local infiltration   Local anesthetic:  Bupivacaine 0.5% w/o epi Laceration details:    Location:  Finger   Finger location:  R long finger   Length (cm):  1.5 Repair type:    Repair type:  Intermediate Pre-procedure details:    Preparation:  Patient was prepped and draped in usual sterile fashion Exploration:    Wound extent: no foreign bodies/material noted and no tendon damage noted   Treatment:    Area cleansed with:  Betadine   Amount of cleaning:  Extensive   Irrigation solution:  Sterile saline   Irrigation method:  Syringe   Visualized foreign bodies/material removed: no   Skin repair:    Repair method:  Sutures   Suture size:  5-0   Suture material:  Nylon   Suture technique:  Simple interrupted   Number of sutures:  5 Approximation:    Approximation:  Close Post-procedure details:    Dressing:  Antibiotic ointment, non-adherent dressing and splint for protection   Patient tolerance of procedure:  Tolerated well, no immediate complications .Nail Removal Date/Time: 08/01/2018 8:05 PM Performed by: Volanda Napoleon, PA-C Authorized by: Volanda Napoleon, PA-C   Consent:    Consent obtained:  Verbal   Consent given by:  Patient   Risks  discussed:  Bleeding, incomplete removal, infection, pain and permanent nail deformity Location:    Hand:  R long finger Pre-procedure details:    Skin preparation:  Betadine Anesthesia (see MAR for exact dosages):    Anesthesia method:  Nerve block   Block needle gauge:  25 G   Block anesthetic:  Bupivacaine 0.5% w/o epi   Block injection procedure:  Anatomic landmarks identified, introduced needle, incremental injection and negative aspiration for blood   Block outcome:  Anesthesia achieved Nail Removal:    Nail removed:  Complete   Nail bed repaired: yes     Nail bed repair material:  5-0 vicryl   Number of sutures:  3   Removed nail replaced and anchored: yes     Post-procedure details:    Dressing:  Antibiotic ointment   Patient tolerance of procedure:  Tolerated well, no immediate complications   (including critical care time)  Medications Ordered in ED Medications  bupivacaine (MARCAINE) 0.5 % (with pres) injection 20 mL (20 mLs Other Given by Other 08/01/18 2012)  bacitracin ointment (1 application Topical Given 08/01/18 2043)     Initial Impression / Assessment and Plan / ED Course  I have reviewed the triage vital signs and the nursing notes.  Pertinent labs & imaging results that were available during my care of the patient were reviewed by me and considered in my medical decision making (see chart for details).        85 year old female who presents for evaluation of right third digit laceration that occurred just prior to ED arrival.  Patient reports she had mechanical fall.  No preceding chest pain or dizziness.  No head injury, LOC. Patient is afebrile, non-toxic appearing, sitting comfortably on examination table. Vital signs reviewed and stable. Patient is neurovascularly intact.  She has full range of motion without any difficulty.  Do not suspect tendon injury.  She does have an obvious nailbed deformity.  Will get x-ray imaging to ensure no open fracture.  Tetanus is up-to-date.  X-ray reviewed.  Negative for any acute bony abnormality.  Laceration repaired as documented above.  Patient tolerated procedure well.  Nail was removed and cleaned extensively.  Nailbed laceration was repaired with Vicryl sutures. Nailbed laceration was repaired, the nail was reinserted and attached in place.  Patient tolerated procedure well without any difficulties. At this time, patient exhibits no emergent life-threatening condition that require further evaluation in ED or admission. Patient had ample opportunity for questions and discussion. All patient's questions were answered with full understanding. Strict return precautions discussed. Patient  expresses understanding and agreement to plan.    Portions of this note were generated with Lobbyist. Dictation errors may occur despite best attempts at proofreading.   Final Clinical Impressions(s) / ED Diagnoses   Final diagnoses:  Laceration of right middle finger without foreign body with damage to nail, initial encounter    ED Discharge Orders    None       Desma Mcgregor 08/01/18 2322    Quintella Reichert, MD 08/02/18 1046

## 2018-08-01 NOTE — ED Notes (Signed)
Patient verbalizes understanding of discharge instructions. Opportunity for questioning and answers were provided. Armband removed by staff, pt discharged from ED. Pt ambulatory to lobby. Wound change supplies given.

## 2018-08-01 NOTE — Discharge Instructions (Signed)
Keep the wound clean and dry for the first 24 hours. After that you may gently clean the wound with soap and water. Make sure to pat dry the wound before covering it with any dressing. You can use topical antibiotic ointment and bandage. Ice and elevate for pain relief.   You can take Tylenol or Ibuprofen as directed for pain. You can alternate Tylenol and Ibuprofen every 4 hours for additional pain relief.   Return to the Emergency Department, your primary care doctor, or the [facility] in 5-7 days for suture removal.   As we discussed, the sutures in your nail may need to stay there for 1-2 week in order for the nail to grow back.  Monitor closely for any signs of infection. Return to the Emergency Department for any worsening redness/swelling of the area that begins to spread, drainage from the site, worsening pain, fever or any other worsening or concerning symptoms.

## 2018-08-29 ENCOUNTER — Other Ambulatory Visit: Payer: Self-pay | Admitting: *Deleted

## 2018-08-29 DIAGNOSIS — K219 Gastro-esophageal reflux disease without esophagitis: Secondary | ICD-10-CM

## 2018-08-29 DIAGNOSIS — R1013 Epigastric pain: Secondary | ICD-10-CM

## 2018-08-29 NOTE — Telephone Encounter (Signed)
Per last OV patient was to take Rabeprazole 20 mg twice daily. She needs an updated Rx sent to [facility].

## 2018-08-30 MED ORDER — RABEPRAZOLE SODIUM 20 MG PO TBEC: 20.0000 mg | DELAYED_RELEASE_TABLET | Freq: Two times a day (BID) | ORAL | 1 refills | Status: DC

## 2018-08-30 NOTE — Telephone Encounter (Signed)
Rx sent per patient request  

## 2018-08-30 NOTE — Addendum Note (Signed)
Addended by: Gordy Levan, Nezzie Manera A on: 08/30/2018 10:32 AM   Modules accepted: Orders

## 2018-09-02 ENCOUNTER — Telehealth: Payer: Self-pay

## 2018-09-02 NOTE — Telephone Encounter (Signed)
PA for Rabeprazole started on [email or web address]. waiting on an approval or denial.

## 2018-09-03 ENCOUNTER — Ambulatory Visit: Payer: Medicare Other | Admitting: Pulmonary Disease

## 2018-09-04 ENCOUNTER — Telehealth: Payer: Self-pay

## 2018-09-04 NOTE — Telephone Encounter (Signed)
Lmom, waiting on a return call. PA for Rabeprazole was denied. Will discuss other options when pt returns call.

## 2018-09-10 NOTE — Telephone Encounter (Signed)
Received an approval letter after completing an appeal. Rabeprazole was approved 06/07/18-09/05/19. Approval letter will be scanned in chart.

## 2018-09-12 NOTE — Telephone Encounter (Signed)
Pt called to say that she picked her Aciphex up from [place] and she received the approval from [facility]. Pt doesn't need the medication at this time from [facility]. Pt was asked to call them to see if her shipment can be delayed since she picked her RX up.

## 2018-11-08 LAB — HEMOGLOBIN A1C: Hemoglobin A1C: 5.9

## 2018-11-08 LAB — BASIC METABOLIC PANEL
BUN: 15 (ref 4–21)
Chloride: 109 — AB (ref 99–108)
Creatinine: 1.1 (ref 0.5–1.1)
Glucose: 128
Potassium: 4 (ref 3.4–5.3)
Sodium: 141 (ref 137–147)

## 2018-11-08 LAB — COMPREHENSIVE METABOLIC PANEL
Calcium: 9.1 (ref 8.7–10.7)
GFR calc non Af Amer: 46

## 2018-11-13 ENCOUNTER — Other Ambulatory Visit (HOSPITAL_COMMUNITY): Payer: Self-pay | Admitting: Internal Medicine

## 2018-11-13 DIAGNOSIS — Z1231 Encounter for screening mammogram for malignant neoplasm of breast: Secondary | ICD-10-CM

## 2018-11-14 ENCOUNTER — Ambulatory Visit: Payer: Medicare Other | Admitting: Pulmonary Disease

## 2018-11-14 ENCOUNTER — Encounter: Payer: Self-pay | Admitting: Pulmonary Disease

## 2018-11-14 ENCOUNTER — Other Ambulatory Visit: Payer: Self-pay

## 2018-11-14 VITALS — BP 126/60 | HR 72 | Ht 60.0 in | Wt 120.2 lb

## 2018-11-14 DIAGNOSIS — R490 Dysphonia: Secondary | ICD-10-CM | POA: Diagnosis not present

## 2018-11-14 NOTE — Patient Instructions (Signed)
--  CHANGE Breo inhaler to every other day --USE Albuterol inhaler (RESCUE inhaler) as needed --USE nasal spray as needed

## 2018-11-14 NOTE — Progress Notes (Signed)
Synopsis: Referred in 04/2018 for chronic cough  Subjective:   PATIENT ID: Angela Galvan: female DOB: 05-06-1933, MRN: [id number]   HPI  Chief Complaint  Patient presents with  . Follow-up    f/U re: Cough. She states her cough has gotten alot better. She reports she is still loosing her voice. She states she has seen the ENT but they were not able to find out why it is happening.    Angela Galvan is a 85 year old female never smoker with type 2 diabetes, OA, GERD who presents for follow-up for chronic cough.  Last seen in clinic with me on 07/04/2018.  She was started on gabapentin and Breo for chronic cough.  Since then her cough has resolved by 75%.  She is compliant with her Breo daily and takes albuterol once a day usually due to activity such as vacuuming around the house.  During these activities she will have an occasional unproductive cough but not near to the severity of before.  She still complains of sudden onset of hoarseness after talking for 4 to 5 minutes that sometimes leads to loss of voice.  Symptoms are triggered by hairspray, cleaner sprays and perfumes.  Reflux well controlled.  Takes Claritin daily for allergies.  Previously followed by [facility] ENT - no significant sinusitis to contribute to cough. Has occasional congestion.  Denies fevers, chills, chest pain, shortness of breath, wheezing, hemoptysis, nausea.  Environmental exposures: None. 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   I have personally reviewed patient's past medical/family/social history.  Past Medical History:  Diagnosis Date  . Anxiety   . Arthritis   . Cancer [facility])    breast cancer left with mastectomy  . 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 Depression   . Diabetes mellitus    no meds.-diet controlled  . Diarrhea   . Diverticulitis    s/p colectomy in 09-18-89  . GERD (gastroesophageal reflux disease)    . HOH (hard of hearing)   . HTN (hypertension)    Does not see a cardiologist  . Hyperlipidemia   . Hypothyroidism   . IBS (irritable bowel syndrome)   . S/P colonoscopy 09-18-2001   minimal internal hemorrhoids, pancolonic diverticula, biopsy of rectum: lymphoplasmacytic microscopic colitis, colon biopsies normal  . S/P colonoscopy 2010-09-19   pancolonic diverticula, nl TI, external hemorrhoidal tag,/rectum bx no abnormalities  . S/P endoscopy Sep 19, 2010   normal esophagus, stenotic pyloric channel, TTS dilation  . Stroke ([place]) 08/10/2006   no residual     Family History  Problem Relation Age of Onset  . Stroke Mother        deceased after 3 strokes 09-19-99  . Diabetes Father   . Stroke Maternal Grandmother        deceased after 3 strokes  . Stroke Sister        twin sister  . Colon cancer Neg Hx      Social History   Occupational History  . Occupation: retired    Fish farm manager: RETIRED  Tobacco Use  . Smoking status: Never Smoker  . Smokeless tobacco: Never Used  . Tobacco comment: Never smoked  Substance and Sexual Activity  . Alcohol use: Yes    Alcohol/week: 1.0 standard drinks    Types: 1 Glasses of wine per week    Comment: occassional wine  . Drug use: No  . Sexual activity: Never  No Known Allergies   Outpatient Medications Prior to Visit  Medication Sig Dispense Refill  . acetaminophen (TYLENOL) 500 MG tablet Take 500-1,000 mg by mouth every 6 (six) hours as needed. Pain     . albuterol (PROVENTIL HFA;VENTOLIN HFA) 108 (90 Base) MCG/ACT inhaler Inhale 2 puffs into the lungs every 6 (six) hours as needed for wheezing or shortness of breath. 1 Inhaler 3  . ALPRAZolam (XANAX) 0.5 MG tablet Take 0.5 mg by mouth 2 (two) times daily as needed for sleep or anxiety.    Marland Kitchen aspirin 325 MG tablet Take 325 mg by mouth daily.    Marland Kitchen atorvastatin (LIPITOR) 10 MG tablet Take 10 mg by mouth daily after breakfast.    . benzonatate (TESSALON PERLES) 100 MG capsule Take 2 capsules (200 mg  total) by mouth 3 (three) times daily as needed for cough. 20 capsule 1  . cholestyramine (QUESTRAN) 4 g packet MIX 1 PACKET IN 4-8 OUNCES OF BEVERAGE & DRINK AT BEDTIME AS DIRECTED. DO NOT TAKE WITHIN 2 HOURS OF OTHER MEDS. 60 packet 2  . clidinium-chlordiazePOXIDE (LIBRAX) 5-2.5 MG capsule TAKE 1 CAPSULE BY MOUTH FOUR TIMES DAILY BEFORE MEALS AND AT BEDTIME AS NEEDED. (Patient taking differently: No sig reported) 40 capsule 1  . FLUoxetine (PROZAC) 20 MG capsule Take 20 mg by mouth daily.    . fluticasone (FLONASE) 50 MCG/ACT nasal spray Place into both nostrils daily.    . fluticasone furoate-vilanterol (BREO ELLIPTA) 100-25 MCG/INH AEPB Inhale 1 puff into the lungs daily. 60 each 3  . gabapentin (NEURONTIN) 300 MG capsule Take 1 capsule (300 mg total) by mouth 3 (three) times daily as needed. 90 capsule 3  . levothyroxine (SYNTHROID, LEVOTHROID) 50 MCG tablet Take 50 mcg by mouth daily before breakfast.     . loperamide (IMODIUM) 2 MG capsule Take 2 mg by mouth as needed for diarrhea or loose stools.    Marland Kitchen loratadine (CLARITIN) 10 MG tablet Take 10 mg by mouth daily.    Marland Kitchen losartan (COZAAR) 100 MG tablet Take 100 mg by mouth daily.    . pantoprazole (PROTONIX) 40 MG tablet Take 40 mg by mouth 2 (two) times daily before a meal.    . Probiotic Product (ALIGN PO) Take 1 tablet by mouth daily.     . RABEprazole (ACIPHEX) 20 MG tablet Take 1 tablet (20 mg total) by mouth 2 (two) times daily. 180 tablet 1  . topiramate (TOPAMAX) 25 MG tablet Take 25 mg by mouth daily.     Marland Kitchen UNABLE TO FIND Rx: L8000- Post Surgical Bras (Quantity: 6) E0814- Silicone Breast Prosthesis (Quantity: 1) Dx: 174.9; Left Mastectomy 1 each 0  . UNABLE TO FIND Rx: G8185- Mastectomy Form, left (Quantity: 1) Dx: 174.9; Left mastectomy 1 each 0   No facility-administered medications prior to visit.     Review of Systems  Constitutional: Negative for chills, diaphoresis, fever, malaise/fatigue and weight loss.  HENT: Negative  for congestion, ear pain and sore throat.        Hoarseness  Respiratory: Positive for cough. Negative for hemoptysis, sputum production, shortness of breath and wheezing.   Cardiovascular: Negative for chest pain, palpitations and leg swelling.  Gastrointestinal: Negative for heartburn and nausea.  Skin: Negative for itching and rash.  Neurological: Negative for dizziness, weakness and headaches.     Objective:    Vitals:   11/14/18 1448  BP: 126/60  Pulse: 72  SpO2: 96%  Weight: 120 lb 3.2 oz (54.5 kg)  Height: 5' (1.524 m)   SpO2: 96 %  Physical Exam: General: Well-appearing, no acute distress HENT: [place], AT, OP with mild erythema, MMM Eyes: EOMI, no scleral icterus Respiratory: Clear to auscultation bilaterally.  No crackles, wheezing or rales Cardiovascular: RRR, -M/R/G, no JVD GI: BS+, soft, nontender Extremities:-Edema,-tenderness Neuro: AAO x4, CNII-XII grossly intact Skin: Intact, no rashes or bruising Psych: Normal mood, normal affect  Chest imaging:  CXR 03/06/18 No acute infiltrate, effusion or edema  CT Chest 06/20/18 Normal lung airways and parenchyma. No infiltrate, effusion or edema.  PFT:  06/06/18- FEV1/FVC 83 FEV1 1.42 (90%) FVC 1.71 (80%). No sig BD. DLCO 56%.  Mild restrictive defect with reduced DLCO may represent early ILD  Esophageal Manometry 05/13/18: No esophageal abnormality  PH Study 05/13/18: Good acid suppression on PPI  I have personally reviewed the above labs, images and tests noted above.    Assessment & Plan:   Chronic Cough Abnormal PFTs  Discussion: 85 year old female with diabetes and reflux who initially presented with chronic cough.  After starting ICS inhaler and gabapentin, cough has improved.  Patient's primary concern is now her persistent hoarseness that leads to inability to phonate with conversation.  She would like a second opinion from an ENT.  Chronic Cough Controlled on current regimen --CHANGE Breo inhaler  to every other day --USE Albuterol inhaler (RESCUE inhaler) as needed --USE nasal spray as needed --CONTINUE gabapentin as directed  Rhinorrhea --Continue Flonase Nasal spray. Administer 1 spray per nostril daily --CONTINUE loratadine (Claritin) 10 mg daily  Hoarseness Concerned secondary to inhaler use. --Reduced inhaler usage as noted above --Refer to [facility] ENT   Orders Placed This Encounter  Procedures  . Ambulatory referral to ENT    Referral Priority:   Routine    Referral Type:   Consultation    Referral Reason:   Specialty Services Required    Requested Specialty:   Otolaryngology    Number of Visits Requested:   1   No orders of the defined types were placed in this encounter.   Return in about 3 months (around 02/14/2019).  Greater than 50% of this patient 30-minute office visit was spent face-to-face in counseling with the patient/family. We discussed medical diagnosis and treatment plan as noted.   Corley Maffeo Rodman Pickle, MD [facility] 11/14/2018 4:00 PM  Personal pager: [phone] If unanswered, please page CCM On-call: [phone]

## 2018-11-27 ENCOUNTER — Other Ambulatory Visit: Payer: Self-pay | Admitting: Pulmonary Disease

## 2018-12-02 ENCOUNTER — Telehealth: Payer: Self-pay | Admitting: Pulmonary Disease

## 2018-12-02 ENCOUNTER — Other Ambulatory Visit: Payer: Self-pay | Admitting: Gastroenterology

## 2018-12-02 NOTE — Telephone Encounter (Signed)
Call returned to patient, she states her cough has started getting bad since she decreased the anoro to every other day. She denies coughing up anything but she is coughing more. She is requesting advice on whether or not she should start the inhaler back to daily or what she should do. Declined in office visit, declined mychart visit, televisit made. Nothing further is needed at this time. Covid screen negative.

## 2018-12-03 ENCOUNTER — Encounter: Payer: Self-pay | Admitting: Adult Health

## 2018-12-03 ENCOUNTER — Other Ambulatory Visit: Payer: Self-pay

## 2018-12-03 ENCOUNTER — Ambulatory Visit (INDEPENDENT_AMBULATORY_CARE_PROVIDER_SITE_OTHER): Payer: Medicare Other | Admitting: Adult Health

## 2018-12-03 DIAGNOSIS — R05 Cough: Secondary | ICD-10-CM | POA: Diagnosis not present

## 2018-12-03 DIAGNOSIS — R490 Dysphonia: Secondary | ICD-10-CM

## 2018-12-03 DIAGNOSIS — R053 Chronic cough: Secondary | ICD-10-CM

## 2018-12-03 NOTE — Patient Instructions (Signed)
Voice rest .  Begin Delsym over-the-counter cough syrup 2 teaspoons twice daily for cough Increase BREO 1 inhalation daily , rinse well after use.  Sips of water to soothe throat to to avoid throat clearing and cough No mint products  GERD diet Continue on AcipHex twice daily Continue on Claritin and Flonase daily Continue on gabapentin 3 times daily Follow-up with ENT this month as planned Follow up with Dr. [name] or Nakema Fake NP in 4 weeks and As needed   Please contact office for sooner follow up if symptoms do not improve or worsen or seek emergency care

## 2018-12-03 NOTE — Progress Notes (Signed)
Virtual Visit via Telephone Note  I connected with Angela Galvan on 12/03/18 at 10:00 AM EDT by telephone and verified that I am speaking with the correct person using two identifiers.  Location: Patient: Home  Provider: Office    I discussed the limitations, risks, security and privacy concerns of performing an evaluation and management service by telephone and the availability of in person appointments. I also discussed with the patient that there may be a patient responsible charge related to this service. The patient expressed understanding and agreed to proceed.   History of Present Illness: Today's televisit is for a follow up for Cough   85 year old female never smoker followed for chronic cough. Initial pulmonary consult 04/2018  Medical history significant for diabetes, osteoarthritis, GERD. Patient has had a chronic cough for the last 2 years.  Describes as a severe dry cough with severe coughing paroxysms.  Patient was initially seen for pulmonary consult December 2019.  Pulmonary function testing showed mild restrictive defect with a reduced DLCO.  No airflow obstruction.  CT chest showed normal lung airways and parenchyma.  Patient also had associated hoarseness that has been getting progressively worse.  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  She has been seen by ENT with patient reports of no identifiable cause.  She has been started on gabapentin 300 mg 3 times a day for chronic cough.  Started on [place] daily.  Recommend to use Claritin and Flonase.  Patient says that her cough has gotten much better and was nearly totally resolved.  Was seen in the office last month.  With improved cough.  But ongoing hoarseness.  She was recommend to decrease Breo to every other day.  And was referred to ENT for a second opinion.  She has an upcoming appointment with ENT on December 19, 2018.  She has used Tessalon in the past but says it is too expensive.  No associated muscle  weakness, gait abnormalities.  Does complain of nasal drainage.  Says that she does not use her Claritin every day.  Feels that since she decreased her Breo use that her cough has returned and is getting worse.  The cough is keeping her up at night. No discolored mucus or fever. No travel . No known sick contacts.    Observations/Objective: CXR 03/06/18 No acute infiltrate, effusion or edema  CT Chest 06/20/18 Normal lung airways and parenchyma. No infiltrate, effusion or edema.  PFT:  06/06/18- FEV1/FVC 83 FEV1 1.42 (90%) FVC 1.71 (80%). No sig BD. DLCO 56%.  Mild restrictive defect with reduced DLCO may represent early ILD  Esophageal Manometry 05/13/18: No esophageal abnormality  PH Study 05/13/18: Good acid suppression on PPI  Assessment and Plan: Chronic Cough ?etiology .  Work-up shows CT chest without acute abnormality.  Possibly secondary to cough variant asthma as she improved with Breo.  No airflow obstruction on PFTs.  Continue on GERD regimen.  Will control for triggers and add cough suppressant  Increase BREO dosing to daily (perceived benefit from [facility] daily dosing )  Continue on Gabapentin  Add delsym   Chronic Hoarseness-?etiology   Go for ENT second opinion . Control triggers -GERD/AR  Voice rest  Inhaler Oral care   Plan  Patient Instructions  Voice rest .  Begin Delsym over-the-counter cough syrup 2 teaspoons twice daily for cough Increase BREO 1 inhalation daily , rinse well after use.  Sips of water to soothe throat to to avoid throat clearing  and cough No mint products  GERD diet Continue on AcipHex twice daily Continue on Claritin and Flonase daily Continue on gabapentin 3 times daily Follow-up with ENT this month as planned Follow up with Dr. [name] or Raelynne Ludwick NP in 4 weeks and As needed   Please contact office for sooner follow up if symptoms do not improve or worsen or seek emergency care          Follow Up Instructions: Follow up 4  weeks and As needed      I discussed the assessment and treatment plan with the patient. The patient was provided an opportunity to ask questions and all were answered. The patient agreed with the plan and demonstrated an understanding of the instructions.   The patient was advised to call back or seek an in-person evaluation if the symptoms worsen or if the condition fails to improve as anticipated.  I provided 32 minutes of non-face-to-face time during this encounter.   Rexene Edison, NP

## 2018-12-05 ENCOUNTER — Telehealth: Payer: Self-pay | Admitting: Internal Medicine

## 2018-12-05 DIAGNOSIS — R197 Diarrhea, unspecified: Secondary | ICD-10-CM

## 2018-12-05 NOTE — Telephone Encounter (Signed)
PATIENT CALLED AND I MADE HER AN APPOINTMENT FOR AUGUST AND ADDED HER TO A RECALL, SHE STATED THAT SHE IS HAVING HORRIBLE DIARRHEA. IS THERE SOMETHING THAT CAN HELP HER PRIOR TO HER APPOINTMENT?, PLEASE GIVE HER A CALL.

## 2018-12-05 NOTE — Telephone Encounter (Signed)
How long has she had diarrhea.  Any abx's.. May need stool studies.  Consult APP in my absence.

## 2018-12-05 NOTE — Telephone Encounter (Signed)
Spoke with pt. She is having very loose/watery stool several times daily. Pt is taking 3-4 Imodium AD tabs and Librax with meals 3-4 times daily. Pt statesPt is scheduled for 12/2018 but isn't sure if there is anything she can take to help her not pt doesn't report nausea or vomiting. Pt gets gas and hasn't changed any of the foods she's eating.

## 2018-12-06 NOTE — Addendum Note (Signed)
Addended by: Gordy Levan, Cherie Lasalle A on: 12/06/2018 07:55 AM   Modules accepted: Orders

## 2018-12-06 NOTE — Telephone Encounter (Signed)
Please ask her if she's had any antibiotics in the past couple months, any "healthcare exposure" or been around any sick contacts.  I've put in orders for stool studies to make sure she hasn't picked up something that's causing her diarrhea. Please ask her to complete these as soon as possible.  Make sure she's drinking plenty of fluids to stay hydrated. If she has significant bleeding, inability to keep down food/fluids, worsening weakness/dizziness (ie- significant dehydration) call us or go to the ER.  Let me know if any questions or problems.

## 2018-12-06 NOTE — Telephone Encounter (Signed)
Called pt back. Pt is going to complete orders for stool studies.

## 2018-12-06 NOTE — Telephone Encounter (Signed)
Tried calling pt. No VM, not able to leave a message. Will call pt back.

## 2018-12-10 ENCOUNTER — Encounter: Payer: Self-pay | Admitting: Nurse Practitioner

## 2018-12-10 ENCOUNTER — Ambulatory Visit: Payer: Medicare Other | Admitting: Nurse Practitioner

## 2018-12-10 ENCOUNTER — Other Ambulatory Visit: Payer: Self-pay | Admitting: *Deleted

## 2018-12-10 ENCOUNTER — Other Ambulatory Visit: Payer: Self-pay

## 2018-12-10 DIAGNOSIS — R159 Full incontinence of feces: Secondary | ICD-10-CM | POA: Insufficient documentation

## 2018-12-10 NOTE — Progress Notes (Signed)
Referring Provider: Asencion Noble, MD Primary Care Physician:  Asencion Noble, MD Primary GI:  Dr. Gala Romney  Chief Complaint  Patient presents with  . Diarrhea    HPI:   Angela Galvan is a 85 y.o. female who presents for diarrhea.  The patient was last seen in our office 07/02/2018 for GERD and cough.  At that time noted extensive evaluation for GI standpoint, not improved on twice daily PPI (most recently Protonix and rabeprazole previously).  Recent manometry and pH/impedance in [place] with no reflux during the study.  Esophageal motility looks good as well.  Previous EGD with barium esophagram demonstrated only a tiny Zenker's diverticulum.  Follows ENT with Dr. Janace Hoard.  Recent CT reviewed, has symptoms all the time.  Overall chronic cough and hoarseness and frustrated by persistent symptoms despite extensive evaluation by GI, ENT, and pulmonary.  No clinically significant reflux on pH/impedance.  Continue rabeprazole 20 mg twice daily, otherwise no further GI evaluation.  Follow-up in 6 months.  Today she states she's doing ok overall. She still has diarrhea, states she's had this "seems like forever." Has a history of IBS. In the last 3-4 months has had worsening diarrhea, sometimes stools are loose/liquidy, sometimes not but it "just slips out." S/P cholecystectomy. Typically has a bowel movement ("It just slips out") when she has to urinate. She states it's rare to have "loose or liquidy" stools; only occurs about once or twice a month. Otherwise it's a formed stool. Has to wear depends. Denies abdominal pain, N/V, fever, chills, unintentional weight loss. Denies URI or flu-like symptoms. Denies loss of sense of taste or smell. Denies chest pain, dyspnea, dizziness, lightheadedness, syncope, near syncope. Denies any other upper or lower GI symptoms.  Past Medical History:  Diagnosis Date  . Anxiety   . Arthritis   . Cancer [facility])    breast cancer left with mastectomy  . DCIS (ductal carcinoma  in situ) of breast 03/07/2013   Grade III, 3.5 cm, ER-/PR- left breast s/p mastectomy with SLN (0/1)  . Depression   . Diabetes mellitus    no meds.-diet controlled  . Diarrhea   . Diverticulitis    s/p colectomy in 1991  . GERD (gastroesophageal reflux disease)   . HOH (hard of hearing)   . HTN (hypertension)    Does not see a cardiologist  . Hyperlipidemia   . Hypothyroidism   . IBS (irritable bowel syndrome)   . S/P colonoscopy 2003   minimal internal hemorrhoids, pancolonic diverticula, biopsy of rectum: lymphoplasmacytic microscopic colitis, colon biopsies normal  . S/P colonoscopy 2012   pancolonic diverticula, nl TI, external hemorrhoidal tag,/rectum bx no abnormalities  . S/P endoscopy 2012   normal esophagus, stenotic pyloric channel, TTS dilation  . Stroke ([place]) 08/10/2006   no residual    Past Surgical History:  Procedure Laterality Date  . ABDOMINAL HYSTERECTOMY  1983  . bilateral cataracts    . BIOPSY  03/23/2016   Procedure: BIOPSY;  Surgeon: Daneil Dolin, MD;  [facility];  Service: Endoscopy;;  colon   . BREAST BIOPSY Left 01/10/2013   Procedure: BREAST BIOPSY;  Surgeon: Jamesetta So, MD;  Location: [facility];  Service: General;  Laterality: Left;  . BREAST LUMPECTOMY Right 01/29/2015   Procedure: RIGHT BREAST LUMPECTOMY;  Surgeon: Fanny Skates, MD;  Location: [place];  Service: General;  Laterality: Right;  . CHOLECYSTECTOMY    . COLECTOMY    . COLONOSCOPY  09/11/01  Minimal internal hemorrhoids, otherwise normal rectum . Scattered pancolonic diverticula (few) The colonic mucosa otherwise appeared normal status post biopsies and stool sampling as described above.  . COLONOSCOPY N/A 03/23/2016   Procedure: COLONOSCOPY;  Surgeon: Daneil Dolin, MD;  [facility];  Service: Endoscopy;  Laterality: N/A;  1115  . ESOPHAGEAL MANOMETRY N/A 05/13/2018   Procedure: ESOPHAGEAL MANOMETRY (EM);  Surgeon: Mauri Pole,  MD;  [facility];  Service: Endoscopy;  Laterality: N/A;  Dr Julious Payer  . ESOPHAGOGASTRODUODENOSCOPY  10/11/2010   Normal esophagus, small hiatal hernia,Stenotic pyloric channel likely critical.  Likely contributing to some of the patient's symptoms, status post TTS balloon dilation Normal D1-D3 status post biopsy.  D2-D3 atrophic-appearing gastric mucosa status post biopsy  . ileocolonoscopy  10/11/2010   external hemorrhoidal tag.  Normal rectum status post biopsy Pancolonic diverticula status post segmental biopsy.  Normal  terminal ileum  . JOINT REPLACEMENT Bilateral   . KNEE ARTHROPLASTY Right 2004  . KNEE SURGERY Bilateral    2004- [facility]- total; knee  . [place] IMPEDANCE STUDY N/A 05/13/2018   Procedure: [place] IMPEDANCE STUDY;  Surgeon: Mauri Pole, MD;  [facility];  Service: Endoscopy;  Laterality: N/A;  On PPI  . right ovarian tumor removed as teenager    . SIMPLE MASTECTOMY WITH AXILLARY SENTINEL NODE BIOPSY Left 03/07/2013   Procedure: TOTAL  MASTECTOMY WITH AXILLARY SENTINEL NODE BIOPSY;  Surgeon: Adin Hector, MD;  Location: [place];  Service: General;  Laterality: Left;  . TOTAL KNEE ARTHROPLASTY  09/25/2011   Procedure: TOTAL KNEE ARTHROPLASTY;  Surgeon: Gearlean Alf, MD;  [facility];  Service: Orthopedics;  Laterality: Left;    Current Outpatient Medications  Medication Sig Dispense Refill  . acetaminophen (TYLENOL) 500 MG tablet Take 500-1,000 mg by mouth every 6 (six) hours as needed. Pain     . albuterol (PROVENTIL HFA;VENTOLIN HFA) 108 (90 Base) MCG/ACT inhaler Inhale 2 puffs into the lungs every 6 (six) hours as needed for wheezing or shortness of breath. 1 Inhaler 3  . ALPRAZolam (XANAX) 0.5 MG tablet Take 0.5 mg by mouth 2 (two) times daily as needed for sleep or anxiety.    Marland Kitchen aspirin 325 MG tablet Take 325 mg by mouth daily.    Marland Kitchen atorvastatin (LIPITOR) 10 MG tablet Take 10 mg by mouth daily after breakfast.    . benzonatate (TESSALON  PERLES) 100 MG capsule Take 2 capsules (200 mg total) by mouth 3 (three) times daily as needed for cough. 20 capsule 1  . BREO ELLIPTA 100-25 MCG/INH AEPB INHALE 1 PUFF INTO THE LUNGS DAILY. 60 each 3  . cholestyramine (QUESTRAN) 4 g packet MIX 1 PACKET IN 4-8 OUNCES OF BEVERAGE & DRINK AT BEDTIME AS DIRECTED. DO NOT TAKE WITHIN 2 HOURS OF OTHER MEDS. 60 each 3  . clidinium-chlordiazePOXIDE (LIBRAX) 5-2.5 MG capsule TAKE 1 CAPSULE BY MOUTH FOUR TIMES DAILY BEFORE MEALS AND AT BEDTIME AS NEEDED. (Patient taking differently: No sig reported) 40 capsule 1  . FLUoxetine (PROZAC) 20 MG capsule Take 20 mg by mouth daily.    . fluticasone (FLONASE) 50 MCG/ACT nasal spray Place into both nostrils daily.    Marland Kitchen gabapentin (NEURONTIN) 300 MG capsule TAKE 1 CAPSULE BY MOUTH 3 TIMES A DAY AS NEEDED (Patient taking differently: Take 300 mg by mouth 3 (three) times daily. ) 90 capsule 3  . levothyroxine (SYNTHROID, LEVOTHROID) 50 MCG tablet Take 50 mcg by mouth daily before breakfast.     .  loperamide (IMODIUM) 2 MG capsule Take 2 mg by mouth as needed for diarrhea or loose stools.    Marland Kitchen loratadine (CLARITIN) 10 MG tablet Take 10 mg by mouth daily.    Marland Kitchen losartan (COZAAR) 100 MG tablet Take 100 mg by mouth daily.    . pantoprazole (PROTONIX) 40 MG tablet Take 40 mg by mouth 2 (two) times daily before a meal.    . Probiotic Product (ALIGN PO) Take 1 tablet by mouth daily.     . RABEprazole (ACIPHEX) 20 MG tablet Take 1 tablet (20 mg total) by mouth 2 (two) times daily. 180 tablet 1  . topiramate (TOPAMAX) 25 MG tablet Take 25 mg by mouth daily.     Marland Kitchen UNABLE TO FIND Rx: L8000- Post Surgical Bras (Quantity: 6) H2992- Silicone Breast Prosthesis (Quantity: 1) Dx: 174.9; Left Mastectomy 1 each 0  . UNABLE TO FIND Rx: E2683- Mastectomy Form, left (Quantity: 1) Dx: 174.9; Left mastectomy 1 each 0   No current facility-administered medications for this visit.     Allergies as of 12/10/2018  . (No Known Allergies)     Family History  Problem Relation Age of Onset  . Stroke Mother        deceased after 3 strokes 09-14-99  . Diabetes Father   . Stroke Maternal Grandmother        deceased after 3 strokes  . Stroke Sister        twin sister  . Colon cancer Neg Hx     Social History   Socioeconomic History  . Marital status: Married    Spouse name: Not on file  . Number of children: Not on file  . Years of education: Not on file  . Highest education level: Not on file  Occupational History  . Occupation: retired    Fish farm manager: RETIRED  Social Needs  . Financial resource strain: Not on file  . Food insecurity    Worry: Not on file    Inability: Not on file  . Transportation needs    Medical: Not on file    Non-medical: Not on file  Tobacco Use  . Smoking status: Never Smoker  . Smokeless tobacco: Never Used  . Tobacco comment: Never smoked  Substance and Sexual Activity  . Alcohol use: Yes    Alcohol/week: 1.0 standard drinks    Types: 1 Glasses of wine per week    Comment: occassional wine  . Drug use: No  . Sexual activity: Never  Lifestyle  . Physical activity    Days per week: Not on file    Minutes per session: Not on file  . Stress: Not on file  Relationships  . Social Herbalist on phone: Not on file    Gets together: Not on file    Attends religious service: Not on file    Active member of club or organization: Not on file    Attends meetings of clubs or organizations: Not on file    Relationship status: Not on file  Other Topics Concern  . Not on file  Social History Narrative  . Not on file    Review of Systems: General: Negative for anorexia, weight loss, fever, chills, fatigue, weakness. ENT: Negative for hoarseness, difficulty swallowing. CV: Negative for chest pain, angina, palpitations, peripheral edema.  Respiratory: Negative for dyspnea at rest, cough, sputum, wheezing.  GI: See history of present illness. Endo: Negative for unusual weight change.   Heme: Negative for bruising or  bleeding. Allergy: Negative for rash or hives.   Physical Exam: BP 139/67   Pulse 73   Temp (!) 97.1 F (36.2 C) (Oral)   Ht 5' (1.524 m)   Wt 120 lb (54.4 kg)   BMI 23.44 kg/m  General:   Alert and oriented. Pleasant and cooperative. Well-nourished and well-developed.  Eyes:  Without icterus, sclera clear and conjunctiva pink.  Ears:  Normal auditory acuity. Cardiovascular:  S1, S2 present without murmurs appreciated.Extremities without clubbing or edema. Respiratory:  Clear to auscultation bilaterally. No wheezes, rales, or rhonchi. No distress.  Gastrointestinal:  +BS, soft, non-tender and non-distended. No HSM noted. No guarding or rebound. No masses appreciated.  Rectal:  Deferred  Musculoskalatal:  Symmetrical without gross deformities. Neurologic:  Alert and oriented x4;  grossly normal neurologically. Psych:  Alert and cooperative. Normal mood and affect. Heme/Lymph/Immune: No excessive bruising noted.    12/10/2018 9:07 AM   Disclaimer: This note was dictated with voice recognition software. Similar sounding words can inadvertently be transcribed and may not be corrected upon review.

## 2018-12-10 NOTE — Patient Instructions (Signed)
Your health issues we discussed today were:   Fecal incontinence (inability to control passage of bowel movements): 1. As discussed, we will send a referral to [facility] to check the strength of your sphincter muscle in your rectum 2. Further recommendations to follow testing 3. Call us if you have any persistent loose/watery stools  Overall I recommend:  1. Continue current medications 2. Follow-up in 2 months 3. Call us if you have any questions or concerns   Because of recent events of COVID-19 ("Coronavirus"), follow CDC recommendations:  1. Wash your hand frequently 2. Avoid touching your face 3. Stay away from people who are sick 4. If you have symptoms such as fever, cough, shortness of breath then call your healthcare provider for further guidance 5. If you are sick, STAY AT HOME unless otherwise directed by your healthcare provider. 6. Follow directions from state and national officials regarding staying safe   At [facility] we value your feedback. You may receive a survey about your visit today. Please share your experience as we strive to create trusting relationships with our patients to provide genuine, compassionate, quality care.  We appreciate your understanding and patience as we review any laboratory studies, imaging, and other diagnostic tests that are ordered as we care for you. Our office policy is 5 business days for review of these results, and any emergent or urgent results are addressed in a timely manner for your best interest. If you do not hear from our office in 1 week, please contact us.   We also encourage the use of MyChart, which contains your medical information for your review as well. If you are not enrolled in this feature, an access code is on this after visit summary for your convenience. Thank you for allowing [place] to be involved in your care.  It was great to see you today!  I hope you have a great summer!!

## 2018-12-10 NOTE — Assessment & Plan Note (Signed)
Patient indicated she was having diarrhea.  Upon further discussion she rarely has loose or watery stools, typically 1 or 2 times a month.  Her complaint is more what sounds to be fecal incontinence where she cannot control her bowel movements.  She will have a bowel movement even when she just intends to urinate.  She has to wear depends due to her symptoms.  This is been ongoing and quite bothersome for her.  [place] does not change her symptoms.  I feel because she is dealing more with fecal incontinence rather than loose stools adding an antidiarrheal would not be effective.  Recommend anorectal manometry in [facility] to evaluate for possible weak sphincter that might be responsive to physical therapy and/for biofeedback therapy.  Further recommendations to follow testing.  Follow-up in 2 months.  Call if any worsening symptoms.

## 2018-12-11 NOTE — Progress Notes (Signed)
CC'D TO PCP °

## 2018-12-12 ENCOUNTER — Telehealth: Payer: Self-pay

## 2018-12-12 ENCOUNTER — Other Ambulatory Visit: Payer: Self-pay

## 2018-12-12 DIAGNOSIS — Z1159 Encounter for screening for other viral diseases: Secondary | ICD-10-CM

## 2018-12-12 DIAGNOSIS — R159 Full incontinence of feces: Secondary | ICD-10-CM

## 2018-12-12 NOTE — Telephone Encounter (Signed)
Spoke with the patient. Agrees to [place] on 01/02/19 at [facility] site. Agrees to anorectal procedure date on 01/06/19. Instructions reviewed. Written instructions mailed.

## 2018-12-16 ENCOUNTER — Encounter: Payer: Self-pay | Admitting: Pulmonary Disease

## 2018-12-16 NOTE — Telephone Encounter (Signed)
Error

## 2018-12-18 ENCOUNTER — Other Ambulatory Visit: Payer: Self-pay | Admitting: Pulmonary Disease

## 2018-12-19 ENCOUNTER — Ambulatory Visit (INDEPENDENT_AMBULATORY_CARE_PROVIDER_SITE_OTHER): Payer: Medicare Other | Admitting: Otolaryngology

## 2018-12-19 ENCOUNTER — Other Ambulatory Visit (HOSPITAL_COMMUNITY): Payer: Medicare Other

## 2018-12-19 NOTE — Telephone Encounter (Signed)
Is this appropriate for refill ? 

## 2018-12-23 ENCOUNTER — Other Ambulatory Visit: Payer: Self-pay

## 2018-12-23 ENCOUNTER — Ambulatory Visit (HOSPITAL_COMMUNITY)
Admission: RE | Admit: 2018-12-23 | Discharge: 2018-12-23 | Disposition: A | Discharge status: Home / Self Care | Payer: Medicare Other | Source: Ambulatory Visit | Attending: Internal Medicine | Admitting: Internal Medicine

## 2018-12-23 ENCOUNTER — Other Ambulatory Visit (HOSPITAL_COMMUNITY): Payer: Self-pay | Admitting: Internal Medicine

## 2018-12-23 DIAGNOSIS — Z1231 Encounter for screening mammogram for malignant neoplasm of breast: Secondary | ICD-10-CM | POA: Diagnosis not present

## 2018-12-25 ENCOUNTER — Other Ambulatory Visit: Payer: Self-pay | Admitting: Nurse Practitioner

## 2018-12-25 DIAGNOSIS — K219 Gastro-esophageal reflux disease without esophagitis: Secondary | ICD-10-CM

## 2018-12-25 DIAGNOSIS — R1013 Epigastric pain: Secondary | ICD-10-CM

## 2018-12-30 ENCOUNTER — Ambulatory Visit: Payer: Medicare Other | Admitting: Pulmonary Disease

## 2019-01-02 ENCOUNTER — Other Ambulatory Visit (HOSPITAL_COMMUNITY)
Admission: RE | Admit: 2019-01-02 | Discharge: 2019-01-02 | Disposition: A | Discharge status: Home / Self Care | Payer: Medicare Other | Source: Ambulatory Visit | Attending: Gastroenterology | Admitting: Gastroenterology

## 2019-01-02 ENCOUNTER — Other Ambulatory Visit: Payer: Self-pay

## 2019-01-02 DIAGNOSIS — Z01812 Encounter for preprocedural laboratory examination: Secondary | ICD-10-CM | POA: Insufficient documentation

## 2019-01-02 DIAGNOSIS — Z20828 Contact with and (suspected) exposure to other viral communicable diseases: Secondary | ICD-10-CM | POA: Diagnosis not present

## 2019-01-02 LAB — SARS CORONAVIRUS 2 (TAT 6-24 HRS): SARS Coronavirus 2: NEGATIVE

## 2019-01-06 ENCOUNTER — Ambulatory Visit (HOSPITAL_COMMUNITY)
Admission: RE | Admit: 2019-01-06 | Discharge: 2019-01-06 | Disposition: A | Discharge status: Home / Self Care | Payer: Medicare Other | Attending: Gastroenterology | Admitting: Gastroenterology

## 2019-01-06 ENCOUNTER — Encounter (HOSPITAL_COMMUNITY)
Admission: RE | Disposition: A | Discharge status: Home / Self Care | Payer: Self-pay | Source: Home / Self Care | Attending: Gastroenterology

## 2019-01-06 DIAGNOSIS — R159 Full incontinence of feces: Secondary | ICD-10-CM | POA: Insufficient documentation

## 2019-01-06 DIAGNOSIS — R197 Diarrhea, unspecified: Secondary | ICD-10-CM | POA: Diagnosis not present

## 2019-01-06 DIAGNOSIS — K6289 Other specified diseases of anus and rectum: Secondary | ICD-10-CM | POA: Diagnosis not present

## 2019-01-06 HISTORY — PX: ANAL RECTAL MANOMETRY: SHX6358

## 2019-01-06 SURGERY — MANOMETRY, ANORECTAL

## 2019-01-06 NOTE — Progress Notes (Signed)
Anal rectal manometry performed per protocol.  Balloon expulsion test performed per protocol.  Patient able to expel balloon after 20 seconds. Patient tolerated both procedures well.

## 2019-01-07 ENCOUNTER — Encounter (HOSPITAL_COMMUNITY): Payer: Self-pay | Admitting: Gastroenterology

## 2019-01-08 ENCOUNTER — Ambulatory Visit: Payer: Medicare Other | Admitting: Nurse Practitioner

## 2019-01-13 NOTE — Progress Notes (Signed)
Synopsis: Referred in 04/2018 for chronic cough  Subjective:   PATIENT ID: Angela Galvan: female DOB: 06-30-1932, MRN: [id number]   HPI  Chief Complaint  Patient presents with  . Follow-up    hoarseness, chronic cough   [name] is a 85 year old female never smoker with type 2 diabetes, osteoarthritis, GERD presents for follow-up.  She has previously seen me for cough and has been treated with Breo and gabapentin.  On her last visit we tried to reduce her Breo to every other day however she noticed increased coughing and had a telemetry visit with PCCM-NP.  Advised to resume daily Breo and her cough has improved.  Continues to remain unproductive but is not near the severity as it was before.  Her primary concern for this visit is her persistent hoarseness.  She has an appointment with ENT scheduled for next week.  She used to sing in choir at church however since she had developed her cough, she has not been able to participate.  She notices that after a few minutes of talking she can lose her voice.  Her hoarseness worsens with emotion.  Denies audible wheezing or shortness of breath.  She takes Claritin for her allergies.  Denies rhinorrhea.  Reports that her reflux is well controlled.  Environmental exposures: None. 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   I have personally reviewed patient's past medical/family/social history.  Past Medical History:  Diagnosis Date  . Anxiety   . Arthritis   . Cancer [facility])    breast cancer left with mastectomy  . 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 Depression   . Diabetes mellitus    no meds.-diet controlled  . Diarrhea   . Diverticulitis    s/p colectomy in 09-10-89  . GERD (gastroesophageal reflux disease)   . HOH (hard of hearing)   . HTN (hypertension)    Does not see a cardiologist  . Hyperlipidemia   . Hypothyroidism   . IBS  (irritable bowel syndrome)   . S/P colonoscopy Sep 10, 2001   minimal internal hemorrhoids, pancolonic diverticula, biopsy of rectum: lymphoplasmacytic microscopic colitis, colon biopsies normal  . S/P colonoscopy Sep 11, 2010   pancolonic diverticula, nl TI, external hemorrhoidal tag,/rectum bx no abnormalities  . S/P endoscopy Sep 11, 2010   normal esophagus, stenotic pyloric channel, TTS dilation  . Stroke ([place]) 08/10/2006   no residual     Family History  Problem Relation Age of Onset  . Stroke Mother        deceased after 3 strokes 09-11-1999  . Diabetes Father   . Stroke Maternal Grandmother        deceased after 3 strokes  . Stroke Sister        twin sister  . Colon cancer Neg Hx      Social History   Occupational History  . Occupation: retired    Fish farm manager: RETIRED  Tobacco Use  . Smoking status: Never Smoker  . Smokeless tobacco: Never Used  . Tobacco comment: Never smoked  Substance and Sexual Activity  . Alcohol use: Yes    Alcohol/week: 1.0 standard drinks    Types: 1 Glasses of wine per week    Comment: occassional wine  . Drug use: No  . Sexual activity: Never    No Known Allergies   Outpatient Medications Prior to Visit  Medication Sig Dispense Refill  . acetaminophen (TYLENOL) 500  MG tablet Take 500-1,000 mg by mouth every 6 (six) hours as needed. Pain     . albuterol (PROVENTIL HFA;VENTOLIN HFA) 108 (90 Base) MCG/ACT inhaler Inhale 2 puffs into the lungs every 6 (six) hours as needed for wheezing or shortness of breath. 1 Inhaler 3  . ALPRAZolam (XANAX) 0.5 MG tablet Take 0.5 mg by mouth 2 (two) times daily as needed for sleep or anxiety.    Marland Kitchen aspirin 325 MG tablet Take 325 mg by mouth daily.    Marland Kitchen atorvastatin (LIPITOR) 10 MG tablet Take 10 mg by mouth daily after breakfast.    . benzonatate (TESSALON) 100 MG capsule TAKE 2 CAPSULES THREE TIMES DAILY AS NEEDED. 20 capsule 1  . BREO ELLIPTA 100-25 MCG/INH AEPB INHALE 1 PUFF INTO THE LUNGS DAILY. 60 each 3  . cholestyramine  (QUESTRAN) 4 g packet MIX 1 PACKET IN 4-8 OUNCES OF BEVERAGE & DRINK AT BEDTIME AS DIRECTED. DO NOT TAKE WITHIN 2 HOURS OF OTHER MEDS. 60 each 3  . clidinium-chlordiazePOXIDE (LIBRAX) 5-2.5 MG capsule TAKE 1 CAPSULE BY MOUTH FOUR TIMES DAILY BEFORE MEALS AND AT BEDTIME AS NEEDED. (Patient taking differently: No sig reported) 40 capsule 1  . FLUoxetine (PROZAC) 20 MG capsule Take 20 mg by mouth daily.    . fluticasone (FLONASE) 50 MCG/ACT nasal spray Place into both nostrils daily.    Marland Kitchen gabapentin (NEURONTIN) 300 MG capsule TAKE 1 CAPSULE BY MOUTH 3 TIMES A DAY AS NEEDED (Patient taking differently: Take 300 mg by mouth 3 (three) times daily. ) 90 capsule 3  . levothyroxine (SYNTHROID, LEVOTHROID) 50 MCG tablet Take 50 mcg by mouth daily before breakfast.     . loperamide (IMODIUM) 2 MG capsule Take 2 mg by mouth as needed for diarrhea or loose stools.    Marland Kitchen loratadine (CLARITIN) 10 MG tablet Take 10 mg by mouth daily.    Marland Kitchen losartan (COZAAR) 100 MG tablet Take 100 mg by mouth daily.    . pantoprazole (PROTONIX) 40 MG tablet Take 40 mg by mouth 2 (two) times daily before a meal.    . Probiotic Product (ALIGN PO) Take 1 tablet by mouth daily.     . RABEprazole (ACIPHEX) 20 MG tablet TAKE 1 TABLET BY MOUTH 2 TIMES DAILY 180 tablet 2  . topiramate (TOPAMAX) 25 MG tablet Take 25 mg by mouth daily.     Marland Kitchen UNABLE TO FIND Rx: L8000- Post Surgical Bras (Quantity: 6) U7253- Silicone Breast Prosthesis (Quantity: 1) Dx: 174.9; Left Mastectomy 1 each 0  . UNABLE TO FIND Rx: [id number]- Mastectomy Form, left (Quantity: 1) Dx: 174.9; Left mastectomy 1 each 0   No facility-administered medications prior to visit.     Review of Systems  Constitutional: Negative for chills, diaphoresis, fever, malaise/fatigue and weight loss.  HENT: Negative for congestion, ear pain and sore throat.        Hoarseness   Respiratory: Negative for cough, hemoptysis, sputum production, shortness of breath and wheezing.    Cardiovascular: Negative for chest pain, palpitations and leg swelling.  Gastrointestinal: Negative for abdominal pain, heartburn and nausea.  Genitourinary: Negative for frequency.  Musculoskeletal: Negative for joint pain and myalgias.  Skin: Negative for itching and rash.  Neurological: Negative for dizziness, weakness and headaches.  Endo/Heme/Allergies: Does not bruise/bleed easily.  Psychiatric/Behavioral: Negative for depression. The patient is not nervous/anxious.      Objective:    Vitals:   01/14/19 1337  BP: (!) 124/54  Pulse: 65  Temp: 98.6 F (  37 C)  TempSrc: Oral  SpO2: 94%  Weight: 120 lb 6.4 oz (54.6 kg)  Height: 5' (1.524 m)   SpO2: 94 % O2 Device: None (Room air)  Physical Exam: General: Well-appearing, no acute distress HENT: [place], AT Eyes: EOMI, no scleral icterus Respiratory: Clear to auscultation bilaterally.  No crackles, wheezing or rales Cardiovascular: RRR, -M/R/G, no JVD GI: BS+, soft, nontender Extremities:-Edema,-tenderness Neuro: AAO x4, CNII-XII grossly intact Skin: Intact, no rashes or bruising Psych: Normal mood, normal affect  Chest imaging:  CXR 03/06/18 No acute infiltrate, effusion or edema  CT Chest 06/20/18 Normal lung airways and parenchyma. No infiltrate, effusion or edema.  PFT:  06/06/18- FEV1/FVC 83 FEV1 1.42 (90%) FVC 1.71 (80%). TLC 70 No sig BD. DLCO 56%.  Borderline mild restrictive defect with reduced DLCO may suggest early ILD. Will need to clinically correlate  Esophageal Manometry 05/13/18: No esophageal abnormality  PH Study 05/13/18: Good acid suppression on PPI  I have personally reviewed the above labs, images and tests noted above.    Assessment & Plan:   Hoarseness Chronic cough  Discussion: 85 year old female never smoker with non-insulin dependent Type 2 diabetes, reflux who presents for chronic cough and hoarseness. Cough is better controlled on ICS inhaler. As for her hoarseness, I suspect this  may be related to chronic laryngitis. Prior ENT evaluations at [facility] report normal exam however patient would like to seek second opinion since her hoarseness has worsened.  Hoarseness Keep your ENT appointment. Please have them send records to our office  Chronic cough CONTINUE Breo 1 puff daily CONTINUE gabapentin as directed  Allergic Rhinitis --CONTINUE Flonase Nasal spray. Administer 1 spray per nostril daily --CONTINUE loratadine (Claritin) 10 mg daily  No orders of the defined types were placed in this encounter.  No orders of the defined types were placed in this encounter.  Return in about 6 months (around 07/17/2019).  Greater than 50% of this patient 15-minute office visit was spent face-to-face in counseling with the patient/family. We discussed medical diagnosis and treatment plan as noted.    Llana Deshazo Rodman Pickle, MD [facility] 01/14/2019 2:03 PM  Personal pager: [phone] If unanswered, please page CCM On-call: [phone]

## 2019-01-14 ENCOUNTER — Other Ambulatory Visit: Payer: Self-pay

## 2019-01-14 ENCOUNTER — Encounter: Payer: Self-pay | Admitting: Pulmonary Disease

## 2019-01-14 ENCOUNTER — Ambulatory Visit: Payer: Medicare Other | Admitting: Pulmonary Disease

## 2019-01-14 VITALS — BP 124/54 | HR 65 | Temp 98.6°F | Ht 60.0 in | Wt 120.4 lb

## 2019-01-14 DIAGNOSIS — R053 Chronic cough: Secondary | ICD-10-CM

## 2019-01-14 DIAGNOSIS — R490 Dysphonia: Secondary | ICD-10-CM

## 2019-01-14 DIAGNOSIS — R05 Cough: Secondary | ICD-10-CM | POA: Diagnosis not present

## 2019-01-14 NOTE — Patient Instructions (Addendum)
Hoarseness Keep your ENT appointment. Please have them send records to our office  Chronic cough CONTINUE Breo 1 puff daily CONTINUE gabapentin as directed  Follow-up in 6 months

## 2019-01-16 ENCOUNTER — Encounter: Payer: Self-pay | Admitting: Pulmonary Disease

## 2019-01-23 ENCOUNTER — Ambulatory Visit (INDEPENDENT_AMBULATORY_CARE_PROVIDER_SITE_OTHER): Payer: Medicare Other | Admitting: Otolaryngology

## 2019-01-23 DIAGNOSIS — R490 Dysphonia: Secondary | ICD-10-CM | POA: Diagnosis not present

## 2019-01-23 DIAGNOSIS — K219 Gastro-esophageal reflux disease without esophagitis: Secondary | ICD-10-CM | POA: Diagnosis not present

## 2019-02-04 ENCOUNTER — Encounter: Payer: Self-pay | Admitting: Pulmonary Disease

## 2019-02-04 NOTE — Progress Notes (Unsigned)
Received [facility] fax for visit on 01/23/19. Reviewed note: Laryngoscopy revealed posterior laryngeal edema. Remainder of exam normal. 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 Offered Voice rehab at the [facility] however she elected to proceed with conservative observation.

## 2019-02-11 ENCOUNTER — Encounter: Payer: Self-pay | Admitting: Nurse Practitioner

## 2019-02-11 ENCOUNTER — Ambulatory Visit: Payer: Medicare Other | Admitting: Nurse Practitioner

## 2019-02-11 ENCOUNTER — Other Ambulatory Visit: Payer: Self-pay

## 2019-02-11 VITALS — BP 143/64 | HR 69 | Temp 96.9°F | Ht 60.0 in | Wt 122.4 lb

## 2019-02-11 DIAGNOSIS — R197 Diarrhea, unspecified: Secondary | ICD-10-CM | POA: Diagnosis not present

## 2019-02-11 DIAGNOSIS — K219 Gastro-esophageal reflux disease without esophagitis: Secondary | ICD-10-CM

## 2019-02-11 MED ORDER — CILIDINIUM-CHLORDIAZEPOXIDE 2.5-5 MG PO CAPS: ORAL_CAPSULE | ORAL | 5 refills | Status: DC

## 2019-02-11 NOTE — Assessment & Plan Note (Signed)
The patient has ongoing diarrhea.  She stopped Questran and did not notice a difference and therefore does not think Questran is helping.  She is currently taking Imodium.  Uses Librax as needed when things are "really bad".  Still with 2-4 loose stools a day.  Under significant stress with the imminent passing of her husband.  Recommended she try Librax once a day with Imodium in the morning.  She can take further Imodium later in the day.  She indicates she will try this and is requesting a refill of her Librax which I will send to her pharmacy.  Follow-up in 3 months.

## 2019-02-11 NOTE — Assessment & Plan Note (Signed)
GERD symptoms doing well on Protonix twice daily despite significant increase in stress as per HPI.  Recommend she continue her current medications and follow-up in 3 months.

## 2019-02-11 NOTE — Patient Instructions (Signed)
Your health issues we discussed today were:   Persistent diarrhea: 1. It is okay to stop taking Questran 2. Try taking Librax and a dose of Imodium every morning. 3. Use further Imodium as needed later in the day 4. You can also use additional doses of Librax later in the day, for "rescue use" with severe diarrhea. 5. Call us if you have any worsening or severe symptoms  History of GERD (reflux/heartburn): 1. I am glad your GERD symptoms are staying well controlled with your medication despite your increased stress 2. Call us if you have any worsening or severe symptoms  Overall I recommend:  1. Continue your other current medications 2. Follow-up in 3 months 3. Call us if you have any questions or concerns.   Because of recent events of COVID-19 ("Coronavirus"), follow CDC recommendations:  1. Wash your hand frequently 2. Avoid touching your face 3. Stay away from people who are sick 4. If you have symptoms such as fever, cough, shortness of breath then call your healthcare provider for further guidance 5. If you are sick, STAY AT HOME unless otherwise directed by your healthcare provider. 6. Follow directions from state and national officials regarding staying safe   At [facility] we value your feedback. You may receive a survey about your visit today. Please share your experience as we strive to create trusting relationships with our patients to provide genuine, compassionate, quality care.  We appreciate your understanding and patience as we review any laboratory studies, imaging, and other diagnostic tests that are ordered as we care for you. Our office policy is 5 business days for review of these results, and any emergent or urgent results are addressed in a timely manner for your best interest. If you do not hear from our office in 1 week, please contact us.   We also encourage the use of MyChart, which contains your medical information for your review as  well. If you are not enrolled in this feature, an access code is on this after visit summary for your convenience. Thank you for allowing [place] to be involved in your care.  It was great to see you today!  I am sorry to hear of your husbands poor health and I am thinking of you at this tough time.

## 2019-02-11 NOTE — Progress Notes (Signed)
Referring Provider: Asencion Noble, MD Primary Care Physician:  Asencion Noble, MD Primary GI:  Dr. Gala Romney  Chief Complaint  Patient presents with  . Encopresis    imodium, questran powder    HPI:   Angela Galvan is a 85 y.o. female who presents for follow-up on fecal incontinence.  The patient was last seen in our office 12/10/2018 for the same.  History of persistent GERD with extensive GI evaluation.  Recent manometry and pH/impedance in [place] with no reflux during the study.  Esophageal motility looks good.  Previous EGD with barium esophagram only found a tiny Zenker's diverticulum.  Follows ENT with Dr. Janace Hoard.  Frustrated with persistent symptoms despite extensive evaluation by GI, ENT, pulmonary.  On rabeprazole 20 mg twice daily.  At her last visit noting persistent diarrhea and has had this "seems like forever."  History of IBS and worsening in the past 3 to 4 months and some's time stools are loose/liquidy, sometimes stated "just slipped out."  Status post cholecystectomy.  Typically has fecal incontinence when she has to urinate.  Rare to have "loose or liquidy" stools which only occurs about twice a month.  Has to wear depends.  No other GI complaints.  Recommended referral for sphincter manometry, continue current medications, follow-up in 2 months.  Anal manometry completed 01/06/2019 which found weak sphincter muscles and poor coordination when trying to have a bowel movement.  Sent to physical therapy for strengthening of anal sphincters and treatment of pelvic floor dyssynergy.  The patient indicated she would like to hold off on PT at this time because her husband has been ill and possibly going into a nursing home.  Today she states she's doing so-so. She unfortunately brought her husband to the [place] last night. She has been relying on her daughter and son-in-law who have been off work to help. The have given him about 5 days to live. She is understandably under a lot of  stress. Also had a stress fracture in her left foot. She also lost her sister a few months back. Denies abdominal pain, N/V, hematochezia, melena, fever, chills, unintentional weight loss. Still with loose stools, Imodium helps but doesn't think questran helps. Takes Librax for worsening diarrhea. Just about every stools is loose, had 4 stools yesterday. Has had 2 stools today. Denies URI or flu-like symptoms. Denies loss of sense of taste or smell. Denies chest pain, dyspnea, dizziness, lightheadedness, syncope, near syncope. Denies any other upper or lower GI symptoms.   GERD well managed.   Past Medical History:  Diagnosis Date  . Anxiety   . Arthritis   . Cancer [facility])    breast cancer left with mastectomy  . 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 Depression   . Diabetes mellitus    no meds.-diet controlled  . Diarrhea   . Diverticulitis    s/p colectomy in 1991  . GERD (gastroesophageal reflux disease)   . HOH (hard of hearing)   . HTN (hypertension)    Does not see a cardiologist  . Hyperlipidemia   . Hypothyroidism   . IBS (irritable bowel syndrome)   . S/P colonoscopy 2003   minimal internal hemorrhoids, pancolonic diverticula, biopsy of rectum: lymphoplasmacytic microscopic colitis, colon biopsies normal  . S/P colonoscopy 2012   pancolonic diverticula, nl TI, external hemorrhoidal tag,/rectum bx no abnormalities  . S/P endoscopy 2012  normal esophagus, stenotic pyloric channel, TTS dilation  . Stroke ([place]) 08/10/2006   no residual    Past Surgical History:  Procedure Laterality Date  . ABDOMINAL HYSTERECTOMY  1983  . ANAL RECTAL MANOMETRY N/A 01/06/2019   Procedure: ANO RECTAL MANOMETRY;  Surgeon: Mauri Pole, MD;  Location: [facility];  Service: Endoscopy;  Laterality: N/A;  . bilateral cataracts    . BIOPSY  03/23/2016   Procedure: BIOPSY;  Surgeon: Daneil Dolin,  MD;  Location: [facility];  Service: Endoscopy;;  colon   . BREAST BIOPSY Left 01/10/2013   Procedure: BREAST BIOPSY;  Surgeon: Jamesetta So, MD;  Location: [facility];  Service: General;  Laterality: Left;  . BREAST LUMPECTOMY Right 01/29/2015   Procedure: RIGHT BREAST LUMPECTOMY;  Surgeon: Fanny Skates, MD;  Location: [place];  Service: General;  Laterality: Right;  . CHOLECYSTECTOMY    . COLECTOMY    . COLONOSCOPY  09/11/01   Minimal internal hemorrhoids, otherwise normal rectum . Scattered pancolonic diverticula (few) The colonic mucosa otherwise appeared normal status post biopsies and stool sampling as described above.  . COLONOSCOPY N/A 03/23/2016   Procedure: COLONOSCOPY;  Surgeon: Daneil Dolin, MD;  [facility];  Service: Endoscopy;  Laterality: N/A;  1115  . ESOPHAGEAL MANOMETRY N/A 05/13/2018   Procedure: ESOPHAGEAL MANOMETRY (EM);  Surgeon: Mauri Pole, MD;  [facility];  Service: Endoscopy;  Laterality: N/A;  Dr Julious Payer  . ESOPHAGOGASTRODUODENOSCOPY  10/11/2010   Normal esophagus, small hiatal hernia,Stenotic pyloric channel likely critical.  Likely contributing to some of the patient's symptoms, status post TTS balloon dilation Normal D1-D3 status post biopsy.  D2-D3 atrophic-appearing gastric mucosa status post biopsy  . ileocolonoscopy  10/11/2010   external hemorrhoidal tag.  Normal rectum status post biopsy Pancolonic diverticula status post segmental biopsy.  Normal  terminal ileum  . JOINT REPLACEMENT Bilateral   . KNEE ARTHROPLASTY Right 2004  . KNEE SURGERY Bilateral    2004- [facility]- total; knee  . [place] IMPEDANCE STUDY N/A 05/13/2018   Procedure: [place] IMPEDANCE STUDY;  Surgeon: Mauri Pole, MD;  [facility]: [facility];  Service: Endoscopy;  Laterality: N/A;  On PPI  . right ovarian tumor removed as teenager    . SIMPLE MASTECTOMY WITH AXILLARY SENTINEL NODE BIOPSY Left 03/07/2013   Procedure: TOTAL  MASTECTOMY WITH  AXILLARY SENTINEL NODE BIOPSY;  Surgeon: Adin Hector, MD;  Location: [place];  Service: General;  Laterality: Left;  . TOTAL KNEE ARTHROPLASTY  09/25/2011   Procedure: TOTAL KNEE ARTHROPLASTY;  Surgeon: Gearlean Alf, MD;  Location: [facility];  Service: Orthopedics;  Laterality: Left;    Current Outpatient Medications  Medication Sig Dispense Refill  . acetaminophen (TYLENOL) 500 MG tablet Take 500-1,000 mg by mouth every 6 (six) hours as needed. Pain     . albuterol (PROVENTIL HFA;VENTOLIN HFA) 108 (90 Base) MCG/ACT inhaler Inhale 2 puffs into the lungs every 6 (six) hours as needed for wheezing or shortness of breath. 1 Inhaler 3  . ALPRAZolam (XANAX) 0.5 MG tablet Take 0.5 mg by mouth 2 (two) times daily as needed for sleep or anxiety.    Marland Kitchen aspirin 325 MG tablet Take 325 mg by mouth daily.    Marland Kitchen atorvastatin (LIPITOR) 10 MG tablet Take 10 mg by mouth daily after breakfast.    . benzonatate (TESSALON) 100 MG capsule TAKE 2 CAPSULES THREE TIMES DAILY AS NEEDED. 20 capsule 1  . BREO ELLIPTA 100-25 MCG/INH  AEPB INHALE 1 PUFF INTO THE LUNGS DAILY. 60 each 3  . cholestyramine (QUESTRAN) 4 g packet MIX 1 PACKET IN 4-8 OUNCES OF BEVERAGE & DRINK AT BEDTIME AS DIRECTED. DO NOT TAKE WITHIN 2 HOURS OF OTHER MEDS. 60 each 3  . clidinium-chlordiazePOXIDE (LIBRAX) 5-2.5 MG capsule TAKE 1 CAPSULE BY MOUTH FOUR TIMES DAILY BEFORE MEALS AND AT BEDTIME AS NEEDED. (Patient taking differently: No sig reported) 40 capsule 1  . FLUoxetine (PROZAC) 20 MG capsule Take 20 mg by mouth daily.    . fluticasone (FLONASE) 50 MCG/ACT nasal spray Place into both nostrils daily.    Marland Kitchen gabapentin (NEURONTIN) 300 MG capsule TAKE 1 CAPSULE BY MOUTH 3 TIMES A DAY AS NEEDED (Patient taking differently: Take 300 mg by mouth 3 (three) times daily. ) 90 capsule 3  . levothyroxine (SYNTHROID, LEVOTHROID) 50 MCG tablet Take 50 mcg by mouth daily before breakfast.     . loperamide (IMODIUM) 2 MG capsule Take 2 mg by mouth as needed for  diarrhea or loose stools.    Marland Kitchen loratadine (CLARITIN) 10 MG tablet Take 10 mg by mouth daily.    Marland Kitchen losartan (COZAAR) 100 MG tablet Take 100 mg by mouth daily.    . pantoprazole (PROTONIX) 40 MG tablet Take 40 mg by mouth 2 (two) times daily before a meal.    . Probiotic Product (ALIGN PO) Take 1 tablet by mouth daily.     . RABEprazole (ACIPHEX) 20 MG tablet TAKE 1 TABLET BY MOUTH 2 TIMES DAILY 180 tablet 2  . topiramate (TOPAMAX) 25 MG tablet Take 25 mg by mouth daily.     Marland Kitchen UNABLE TO FIND Rx: L8000- Post Surgical Bras (Quantity: 6) [id number]- Silicone Breast Prosthesis (Quantity: 1) Dx: 174.9; Left Mastectomy 1 each 0  . UNABLE TO FIND Rx: [id number] Mastectomy Form, left (Quantity: 1) Dx: 174.9; Left mastectomy 1 each 0   No current facility-administered medications for this visit.     Allergies as of 02/11/2019  . (No Known Allergies)    Family History  Problem Relation Age of Onset  . Stroke Mother        deceased after 3 strokes 1999/08/26  . Diabetes Father   . Stroke Maternal Grandmother        deceased after 3 strokes  . Stroke Sister        twin sister  . Colon cancer Neg Hx     Social History   Socioeconomic History  . Marital status: Married    Spouse name: Not on file  . Number of children: Not on file  . Years of education: Not on file  . Highest education level: Not on file  Occupational History  . Occupation: retired    Fish farm manager: RETIRED  Social Needs  . Financial resource strain: Not on file  . Food insecurity    Worry: Not on file    Inability: Not on file  . Transportation needs    Medical: Not on file    Non-medical: Not on file  Tobacco Use  . Smoking status: Never Smoker  . Smokeless tobacco: Never Used  . Tobacco comment: Never smoked  Substance and Sexual Activity  . Alcohol use: Yes    Alcohol/week: 1.0 standard drinks    Types: 1 Glasses of wine per week    Comment: occassional wine  . Drug use: No  . Sexual activity: Never  Lifestyle  .  Physical activity    Days per week: Not on file  Minutes per session: Not on file  . Stress: Not on file  Relationships  . Social Herbalist on phone: Not on file    Gets together: Not on file    Attends religious service: Not on file    Active member of club or organization: Not on file    Attends meetings of clubs or organizations: Not on file    Relationship status: Not on file  Other Topics Concern  . Not on file  Social History Narrative  . Not on file    Review of Systems: General: Negative for anorexia, weight loss, fever, chills, fatigue, weakness. ENT: Negative for hoarseness, difficulty swallowing. CV: Negative for chest pain, angina, palpitations, peripheral edema.  Respiratory: Negative for dyspnea at rest, cough, sputum, wheezing.  GI: See history of present illness. Psych: Admits increased anxiety with husband's imminent passing.  Endo: Negative for unusual weight change.  Heme: Negative for bruising or bleeding. Allergy: Negative for rash or hives.   Physical Exam: BP (!) 143/64   Pulse 69   Temp (!) 96.9 F (36.1 C) (Oral)   Ht 5' (1.524 m)   Wt 122 lb 6.4 oz (55.5 kg)   BMI 23.90 kg/m  General:   Alert and oriented. Pleasant and cooperative. Well-nourished and well-developed.  Eyes:  Without icterus, sclera clear and conjunctiva pink.  Ears:  Normal auditory acuity. Cardiovascular:  S1, S2 present without murmurs appreciated. Normal pulses noted. Extremities without clubbing or edema. Respiratory:  Clear to auscultation bilaterally. No wheezes, rales, or rhonchi. No distress.  Gastrointestinal:  +BS, soft, non-tender and non-distended. No HSM noted. No guarding or rebound. No masses appreciated.  Rectal:  Deferred  Musculoskalatal:  Symmetrical without gross deformities. Neurologic:  Alert and oriented x4;  grossly normal neurologically. Psych:  Alert and cooperative. Normal mood and affect. Heme/Lymph/Immune: No excessive bruising noted.     02/11/2019 10:41 AM   Disclaimer: This note was dictated with voice recognition software. Similar sounding words can inadvertently be transcribed and may not be corrected upon review.

## 2019-03-11 ENCOUNTER — Other Ambulatory Visit: Payer: Self-pay

## 2019-03-11 ENCOUNTER — Encounter: Payer: Self-pay | Admitting: Family Medicine

## 2019-03-11 ENCOUNTER — Ambulatory Visit: Payer: Medicare Other | Admitting: Family Medicine

## 2019-03-11 VITALS — BP 142/72 | HR 76 | Temp 98.2°F | Ht 60.0 in | Wt 125.2 lb

## 2019-03-11 DIAGNOSIS — K589 Irritable bowel syndrome without diarrhea: Secondary | ICD-10-CM

## 2019-03-11 DIAGNOSIS — K219 Gastro-esophageal reflux disease without esophagitis: Secondary | ICD-10-CM

## 2019-03-11 DIAGNOSIS — E039 Hypothyroidism, unspecified: Secondary | ICD-10-CM | POA: Diagnosis not present

## 2019-03-11 DIAGNOSIS — R1013 Epigastric pain: Secondary | ICD-10-CM

## 2019-03-11 DIAGNOSIS — C50012 Malignant neoplasm of nipple and areola, left female breast: Secondary | ICD-10-CM

## 2019-03-11 DIAGNOSIS — Z780 Asymptomatic menopausal state: Secondary | ICD-10-CM | POA: Insufficient documentation

## 2019-03-11 DIAGNOSIS — E119 Type 2 diabetes mellitus without complications: Secondary | ICD-10-CM

## 2019-03-11 DIAGNOSIS — R053 Chronic cough: Secondary | ICD-10-CM

## 2019-03-11 DIAGNOSIS — R05 Cough: Secondary | ICD-10-CM

## 2019-03-11 DIAGNOSIS — Z23 Encounter for immunization: Secondary | ICD-10-CM | POA: Diagnosis not present

## 2019-03-11 DIAGNOSIS — R490 Dysphonia: Secondary | ICD-10-CM

## 2019-03-11 DIAGNOSIS — R159 Full incontinence of feces: Secondary | ICD-10-CM

## 2019-03-11 DIAGNOSIS — E785 Hyperlipidemia, unspecified: Secondary | ICD-10-CM

## 2019-03-11 DIAGNOSIS — Z1239 Encounter for other screening for malignant neoplasm of breast: Secondary | ICD-10-CM

## 2019-03-11 LAB — POCT GLYCOSYLATED HEMOGLOBIN (HGB A1C): Hemoglobin A1C: 6.1 % — AB (ref 4.0–5.6)

## 2019-03-11 NOTE — Patient Instructions (Addendum)
STOP PROTONIX and continue ACIPHEX twice a day Stop Topamax Concern for taking Gabapentin during the day-recommendation to take if needed at night Fasting labwork Try stopping claritin to eliminate dry mouth Consider starting grief therapy program

## 2019-03-11 NOTE — Progress Notes (Signed)
New Patient Office Visit  Subjective:  Patient ID: Angela Galvan, female    DOB: 10-22-1932  Age: 85 y.o. MRN: [id number]  CC:  Chief Complaint  Patient presents with  . Establish Care  . Dehydration    mouth    HPI Angela Galvan presents for HTN paget's disease-mastectomy-not currently being followed by oncology Left foot fx-5 weeks ago-podiatry following Dry mouth Hoarseness-not felt related to reflux, ENT evaluation IBS/GERD-followed by GI-Imodium, Librax-helping diarrhea Hypothyroid-recent increase in dosage of levothyroixine to 71mcg Chronic cough-pulmonary following-tessalon, Breo, Flonase, albuterol Anxiety-takes Xanax prn-#30 given 6/20 Depression-prozac HTN-Losartan-daily Hyperlipidemia-atorvastatin AR-claritin   Fill Date ID   Written Drug Qty Days Prescriber Rx # Pharmacy Refill   Daily Dose* Pymt Type PMP    02/11/2019  1   02/11/2019  Chlordiazepoxide-Clidinium Cap  40.00  10 Er A   [id number]   Car ([id number])   0  0.80 LME  Private Pay   [place]  11/15/2018  1   11/15/2018  Alprazolam 0.5 MG Tablet  30.00  15 Ro Fag   [id number]   Car ([id number])   0  2.00 LME       Past Medical History:  Diagnosis Date  . Anxiety   . Arthritis   . Asthma   . Cancer [facility])    breast cancer left with mastectomy  . Cataract   . 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 Depression   . Diabetes mellitus    no meds.-diet controlled  . Diarrhea   . Diverticulitis    s/p colectomy in 1991  . GERD (gastroesophageal reflux disease)   . HOH (hard of hearing)   . HTN (hypertension)    Does not see a cardiologist  . Hyperlipidemia   . Hypothyroidism   . IBS (irritable bowel syndrome)   . S/P colonoscopy 2003   minimal internal hemorrhoids, pancolonic diverticula, biopsy of rectum: lymphoplasmacytic microscopic colitis, colon biopsies normal  . S/P colonoscopy 2012   pancolonic diverticula, nl TI, external  hemorrhoidal tag,/rectum bx no abnormalities  . S/P endoscopy 2012   normal esophagus, stenotic pyloric channel, TTS dilation  . Stroke ([place]) 08/10/2006   no residual    Past Surgical History:  Procedure Laterality Date  . ABDOMINAL HYSTERECTOMY  1983  . ANAL RECTAL MANOMETRY N/A 01/06/2019   Procedure: ANO RECTAL MANOMETRY;  Surgeon: Mauri Pole, MD;  Location: [facility];  Service: Endoscopy;  Laterality: N/A;  . bilateral cataracts    . BIOPSY  03/23/2016   Procedure: BIOPSY;  Surgeon: Daneil Dolin, MD;  [facility];  Service: Endoscopy;;  colon   . BREAST BIOPSY Left 01/10/2013   Procedure: BREAST BIOPSY;  Surgeon: Jamesetta So, MD;  Location: [facility];  Service: General;  Laterality: Left;  . BREAST LUMPECTOMY Right 01/29/2015   Procedure: RIGHT BREAST LUMPECTOMY;  Surgeon: Fanny Skates, MD;  Location: [facility];  Service: General;  Laterality: Right;  . CHOLECYSTECTOMY    . COLECTOMY    . COLONOSCOPY  09/11/01   Minimal internal hemorrhoids, otherwise normal rectum . Scattered pancolonic diverticula (few) The colonic mucosa otherwise appeared normal status post biopsies and stool sampling as described above.  . COLONOSCOPY N/A 03/23/2016   Procedure: COLONOSCOPY;  Surgeon: Daneil Dolin, MD;  [facility];  Service: Endoscopy;  Laterality: N/A;  1115  . ESOPHAGEAL MANOMETRY N/A  05/13/2018   Procedure: ESOPHAGEAL MANOMETRY (EM);  Surgeon: Mauri Pole, MD;  [facility];  Service: Endoscopy;  Laterality: N/A;  Dr Julious Payer  . ESOPHAGOGASTRODUODENOSCOPY  10/11/2010   Normal esophagus, small hiatal hernia,Stenotic pyloric channel likely critical.  Likely contributing to some of the patient's symptoms, status post TTS balloon dilation Normal D1-D3 status post biopsy.  D2-D3 atrophic-appearing gastric mucosa status post biopsy  . ileocolonoscopy  10/11/2010   external hemorrhoidal tag.  Normal rectum status post biopsy  Pancolonic diverticula status post segmental biopsy.  Normal  terminal ileum  . JOINT REPLACEMENT Bilateral   . KNEE ARTHROPLASTY Right 2002-09-16  . KNEE SURGERY Bilateral    09-16-02- [facility]- total; knee  . [place] IMPEDANCE STUDY N/A 05/13/2018   Procedure: [place] IMPEDANCE STUDY;  Surgeon: Mauri Pole, MD;  [facility];  Service: Endoscopy;  Laterality: N/A;  On PPI  . right ovarian tumor removed as teenager    . SIMPLE MASTECTOMY WITH AXILLARY SENTINEL NODE BIOPSY Left 03/07/2013   Procedure: TOTAL  MASTECTOMY WITH AXILLARY SENTINEL NODE BIOPSY;  Surgeon: Adin Hector, MD;  Location: [facility];  Service: General;  Laterality: Left;  . TOTAL KNEE ARTHROPLASTY  09/25/2011   Procedure: TOTAL KNEE ARTHROPLASTY;  Surgeon: Gearlean Alf, MD;  Location[facility];  Service: Orthopedics;  Laterality: Left;    Family History  Problem Relation Age of Onset  . Stroke Mother        deceased after 3 strokes 09/16/99  . Diabetes Father   . Stroke Maternal Grandmother        deceased after 3 strokes  . Stroke Sister        twin sister  . Colon cancer Neg Hx     Social History   Socioeconomic History  . Marital status: Widowed    Spouse name: Not on file  . Number of children: Not on file  . Years of education: Not on file  . Highest education level: Not on file  Occupational History  . Occupation: retired    Fish farm manager: RETIRED  Social Needs  . Financial resource strain: Not on file  . Food insecurity    Worry: Not on file    Inability: Not on file  . Transportation needs    Medical: Not on file    Non-medical: Not on file  Tobacco Use  . Smoking status: Never Smoker  . Smokeless tobacco: Never Used  . Tobacco comment: Never smoked  Substance and Sexual Activity  . Alcohol use: Yes    Alcohol/week: 1.0 standard drinks    Types: 1 Glasses of wine per week    Comment: occassional wine  . Drug use: No  . Sexual activity: Never  Lifestyle  . Physical activity    Days per week: Not on  file    Minutes per session: Not on file  . Stress: Not on file  Relationships  . Social Herbalist on phone: Not on file    Gets together: Not on file    Attends religious service: Not on file    Active member of club or organization: Not on file    Attends meetings of clubs or organizations: Not on file    Relationship status: Not on file  . Intimate partner violence    Fear of current or ex partner: Not on file    Emotionally abused: Not on file    Physically abused: Not on file    Forced sexual  activity: Not on file  Other Topics Concern  . Not on file  Social History Narrative  . Not on file    ROS Review of Systems  Constitutional: Negative.   HENT: Positive for hearing loss and voice change.        Hearing loss  Eyes: Positive for visual disturbance.       Double vision Cataract-lens defect  Respiratory: Positive for cough.        Chronic cough  Cardiovascular: Negative.   Gastrointestinal:       IBS-Librax and Imodium controlling symptoms  Endocrine:       Hypothyroid  Genitourinary: Negative.   Musculoskeletal: Positive for back pain.  Skin: Negative.   Allergic/Immunologic: Positive for environmental allergies.  Neurological: Positive for headaches.  Hematological: Negative.   Psychiatric/Behavioral: The patient is nervous/anxious.        Pts husband died in 03-20-2023    Objective:   Today's Vitals: BP (!) 165/78 (BP Location: Right Arm, Patient Position: Sitting, Cuff Size: Normal)   Pulse 76   Temp 98.2 F (36.8 C) (Oral)   Ht 5' (1.524 m)   Wt 125 lb 3.2 oz (56.8 kg)   SpO2 95%   BMI 24.45 kg/m   Physical Exam Constitutional:      Appearance: Normal appearance.  HENT:     Head: Normocephalic and atraumatic.     Ears:     Comments: Hearing aids in place    Nose: Nose normal.     Mouth/Throat:     Mouth: Mucous membranes are moist.  Eyes:     Conjunctiva/sclera: Conjunctivae normal.  Neck:     Musculoskeletal: Normal range of  motion.  Cardiovascular:     Rate and Rhythm: Normal rate and regular rhythm.     Pulses: Normal pulses.     Heart sounds: Normal heart sounds.  Pulmonary:     Effort: Pulmonary effort is normal.     Breath sounds: Normal breath sounds.  Musculoskeletal:     Right lower leg: Edema present.     Comments: fx left foot  Neurological:     Mental Status: She is alert and oriented to person, place, and time.  Psychiatric:        Mood and Affect: Mood normal.        Behavior: Behavior normal.     Assessment & Plan:    Outpatient Encounter Medications as of 03/11/2019  Medication Sig  . acetaminophen (TYLENOL) 500 MG tablet Take 500-1,000 mg by mouth every 6 (six) hours as needed. Pain   . albuterol (PROVENTIL HFA;VENTOLIN HFA) 108 (90 Base) MCG/ACT inhaler Inhale 2 puffs into the lungs every 6 (six) hours as needed for wheezing or shortness of breath.  . ALPRAZolam (XANAX) 0.5 MG tablet Take 0.5 mg by mouth 2 (two) times daily as needed for sleep or anxiety.  Marland Kitchen aspirin 325 MG tablet Take 325 mg by mouth daily.  Marland Kitchen atorvastatin (LIPITOR) 10 MG tablet Take 10 mg by mouth daily after breakfast.  . benzonatate (TESSALON) 100 MG capsule TAKE 2 CAPSULES THREE TIMES DAILY AS NEEDED.  Marland Kitchen BREO ELLIPTA 100-25 MCG/INH AEPB INHALE 1 PUFF INTO THE LUNGS DAILY.  . cholestyramine (QUESTRAN) 4 g packet MIX 1 PACKET IN 4-8 OUNCES OF BEVERAGE & DRINK AT BEDTIME AS DIRECTED. DO NOT TAKE WITHIN 2 HOURS OF OTHER MEDS.  . clidinium-chlordiazePOXIDE (LIBRAX) 5-2.5 MG capsule Use once daily in the morning. Further doses as needed up to 4 a day  .  FLUoxetine (PROZAC) 20 MG capsule Take 20 mg by mouth daily.  . fluticasone (FLONASE) 50 MCG/ACT nasal spray Place into both nostrils daily.  Marland Kitchen gabapentin (NEURONTIN) 300 MG capsule TAKE 1 CAPSULE BY MOUTH 3 TIMES A DAY AS NEEDED (Patient taking differently: Take 300 mg by mouth 3 (three) times daily. )  . levothyroxine (SYNTHROID, LEVOTHROID) 50 MCG tablet Take 50 mcg  by mouth daily before breakfast.   . loperamide (IMODIUM) 2 MG capsule Take 2 mg by mouth as needed for diarrhea or loose stools.  Marland Kitchen loratadine (CLARITIN) 10 MG tablet Take 10 mg by mouth daily.  Marland Kitchen losartan (COZAAR) 100 MG tablet Take 100 mg by mouth daily.  . pantoprazole (PROTONIX) 40 MG tablet Take 40 mg by mouth 2 (two) times daily before a meal.  . Probiotic Product (ALIGN PO) Take 1 tablet by mouth daily.   . RABEprazole (ACIPHEX) 20 MG tablet TAKE 1 TABLET BY MOUTH 2 TIMES DAILY  . topiramate (TOPAMAX) 25 MG tablet Take 25 mg by mouth daily.   Marland Kitchen UNABLE TO FIND Rx: [id number]- Post Surgical Bras (Quantity: 6) [id number]- Silicone Breast Prosthesis (Quantity: 1) Dx: 174.9; Left Mastectomy  . UNABLE TO FIND Rx: [id number] Mastectomy Form, left (Quantity: 1) Dx: 174.9; Left mastectomy   No facility-administered encounter medications on file as of 03/11/2019.    1. Gastroesophageal reflux disease without esophagitis - Comprehensive Metabolic Panel (CMET) - CBC Aciphex-stop Protonix as per GI 2. Epigastric pain - Comprehensive Metabolic Panel (CMET) - CBC 3. Type 2 diabetes mellitus without complication, without long-term current use of insulin (HCC) A1c-6.1% Foot exam to be completed  Will obtain eye exam 4. Hypothyroidism, unspecified type Recent change to 48mcg -discussed with pt taking separate from other medication - TSH 5. Hyperlipidemia, unspecified hyperlipidemia type - Lipid panel 6. Menopause - DG Bone Density; Future No h/o osteoporosis 7. Encounter for screening for malignant neoplasm of breast, unspecified screening modality - MM Digital Screening; Future 8. Hoarseness ENT in the past-unable to determine cause 9. Incontinence of feces, unspecified fecal incontinence type Diarrhea has improved with imodium and librax-better than Questran 10. Irritable bowel syndrome, unspecified type  Librax 11. Chronic cough pulmonary 12. Paget's disease of left breast  ([place]) mammogram Follow-up:   Angela Tena Hannah Beat, MD

## 2019-03-18 ENCOUNTER — Telehealth: Payer: Self-pay

## 2019-03-18 DIAGNOSIS — E119 Type 2 diabetes mellitus without complications: Secondary | ICD-10-CM

## 2019-03-18 DIAGNOSIS — E785 Hyperlipidemia, unspecified: Secondary | ICD-10-CM

## 2019-03-18 DIAGNOSIS — E039 Hypothyroidism, unspecified: Secondary | ICD-10-CM

## 2019-03-18 NOTE — Telephone Encounter (Signed)
Angela Galvan, CMA  

## 2019-03-19 ENCOUNTER — Other Ambulatory Visit: Payer: Self-pay | Admitting: Family Medicine

## 2019-03-19 DIAGNOSIS — R7309 Other abnormal glucose: Secondary | ICD-10-CM

## 2019-03-19 LAB — CBC
Hematocrit: 41.4 % (ref 34.0–46.6)
Hemoglobin: 14.1 g/dL (ref 11.1–15.9)
MCH: 30.5 pg (ref 26.6–33.0)
MCHC: 34.1 g/dL (ref 31.5–35.7)
MCV: 89 fL (ref 79–97)
Platelets: 356 10*3/uL (ref 150–450)
RBC: 4.63 x10E6/uL (ref 3.77–5.28)
RDW: 12.9 % (ref 11.7–15.4)
WBC: 6.9 10*3/uL (ref 3.4–10.8)

## 2019-03-19 LAB — COMPREHENSIVE METABOLIC PANEL
ALT: 19 IU/L (ref 0–32)
AST: 20 IU/L (ref 0–40)
Albumin/Globulin Ratio: 1.7 (ref 1.2–2.2)
Albumin: 4.3 g/dL (ref 3.6–4.6)
Alkaline Phosphatase: 111 IU/L (ref 39–117)
BUN/Creatinine Ratio: 11 — ABNORMAL LOW (ref 12–28)
BUN: 12 mg/dL (ref 8–27)
Bilirubin Total: 0.5 mg/dL (ref 0.0–1.2)
CO2: 21 mmol/L (ref 20–29)
Calcium: 9.3 mg/dL (ref 8.7–10.3)
Chloride: 106 mmol/L (ref 96–106)
Creatinine, Ser: 1.08 mg/dL — ABNORMAL HIGH (ref 0.57–1.00)
GFR calc Af Amer: 54 mL/min/{1.73_m2} — ABNORMAL LOW (ref >59)
GFR calc non Af Amer: 47 mL/min/{1.73_m2} — ABNORMAL LOW (ref >59)
Globulin, Total: 2.5 g/dL (ref 1.5–4.5)
Glucose: 118 mg/dL — ABNORMAL HIGH (ref 65–99)
Potassium: 4.1 mmol/L (ref 3.5–5.2)
Sodium: 141 mmol/L (ref 134–144)
Total Protein: 6.8 g/dL (ref 6.0–8.5)

## 2019-03-19 LAB — TSH: TSH: 1.6 u[IU]/mL (ref 0.450–4.500)

## 2019-04-07 ENCOUNTER — Telehealth: Payer: Self-pay

## 2019-04-07 ENCOUNTER — Telehealth: Payer: Self-pay | Admitting: Family Medicine

## 2019-04-07 MED ORDER — LEVOTHYROXINE SODIUM 50 MCG PO TABS: 75.0000 ug | ORAL_TABLET | Freq: Every day | ORAL | 0 refills | Status: DC

## 2019-04-07 NOTE — Telephone Encounter (Signed)
Patient is requesting a refill on Synthroid 75 mcg tablet.   [employer], [place]

## 2019-04-07 NOTE — Telephone Encounter (Signed)
LeighAnn Dajanay Northrup, CMA  

## 2019-04-07 NOTE — Telephone Encounter (Signed)
Done

## 2019-05-05 ENCOUNTER — Telehealth: Payer: Self-pay

## 2019-05-05 ENCOUNTER — Telehealth: Payer: Self-pay | Admitting: Family Medicine

## 2019-05-05 DIAGNOSIS — I10 Essential (primary) hypertension: Secondary | ICD-10-CM

## 2019-05-05 DIAGNOSIS — E1159 Type 2 diabetes mellitus with other circulatory complications: Secondary | ICD-10-CM

## 2019-05-05 DIAGNOSIS — F419 Anxiety disorder, unspecified: Secondary | ICD-10-CM

## 2019-05-05 DIAGNOSIS — I152 Hypertension secondary to endocrine disorders: Secondary | ICD-10-CM

## 2019-05-05 DIAGNOSIS — E785 Hyperlipidemia, unspecified: Secondary | ICD-10-CM

## 2019-05-05 DIAGNOSIS — E782 Mixed hyperlipidemia: Secondary | ICD-10-CM

## 2019-05-05 MED ORDER — FLUOXETINE HCL 20 MG PO CAPS: 20.0000 mg | ORAL_CAPSULE | Freq: Every day | ORAL | 0 refills | Status: DC

## 2019-05-05 MED ORDER — LOSARTAN POTASSIUM 100 MG PO TABS: 100.0000 mg | ORAL_TABLET | Freq: Every day | ORAL | 0 refills | Status: AC

## 2019-05-05 MED ORDER — ATORVASTATIN CALCIUM 10 MG PO TABS: 10.0000 mg | ORAL_TABLET | Freq: Every day | ORAL | 0 refills | Status: AC

## 2019-05-05 NOTE — Telephone Encounter (Signed)
Patient is requesting a refill on the following medications.   atorvastatin (LIPITOR) 10 MG tablet   FLUoxetine (PROZAC) 20 MG capsule  losartan (COZAAR) 100 MG tablet      [facility] - [place], [place] - [place] [phone] (Phone) [phone] (Fax)

## 2019-05-05 NOTE — Telephone Encounter (Signed)
Angela Galvan, CMA  

## 2019-05-12 ENCOUNTER — Other Ambulatory Visit: Payer: Self-pay | Admitting: Pulmonary Disease

## 2019-05-12 ENCOUNTER — Telehealth: Payer: Self-pay | Admitting: Pulmonary Disease

## 2019-05-12 MED ORDER — BREO ELLIPTA 100-25 MCG/INH IN AEPB: INHALATION_SPRAY | RESPIRATORY_TRACT | 6 refills | Status: DC

## 2019-05-12 NOTE — Telephone Encounter (Signed)
Spoke with Angela Galvan and advised rx sent to pharmacy. Nothing further is needed.         Patient Instructions by Margaretha Seeds, MD at 01/14/2019 1:30 PM Author: Margaretha Seeds, MD Author Type: Physician Filed: 01/14/2019 2:07 PM  Note Status: Addendum Cosign: Cosign Not Required Encounter Date: 01/14/2019  Editor: Margaretha Seeds, MD (Physician)  Prior Versions: 1. Margaretha Seeds, MD (Physician) at 01/14/2019 1:58 PM - Addendum   2. Margaretha Seeds, MD (Physician) at 01/14/2019 1:56 PM - Signed    Hoarseness Keep your ENT appointment. Please have them send records to our office  Chronic cough CONTINUE Breo 1 puff daily CONTINUE gabapentin as directed  Follow-up in 6 months

## 2019-05-14 ENCOUNTER — Ambulatory Visit: Payer: Medicare Other | Admitting: Nurse Practitioner

## 2019-05-31 ENCOUNTER — Other Ambulatory Visit: Payer: Self-pay | Admitting: Pulmonary Disease

## 2019-06-04 ENCOUNTER — Ambulatory Visit: Payer: Medicare Other | Admitting: Nurse Practitioner

## 2019-06-16 ENCOUNTER — Ambulatory Visit: Payer: Medicare Other | Admitting: Nurse Practitioner

## 2019-06-17 ENCOUNTER — Other Ambulatory Visit: Payer: Self-pay | Admitting: Family Medicine

## 2019-06-20 ENCOUNTER — Ambulatory Visit (INDEPENDENT_AMBULATORY_CARE_PROVIDER_SITE_OTHER): Payer: Medicare Other | Admitting: Gastroenterology

## 2019-06-20 ENCOUNTER — Other Ambulatory Visit: Payer: Self-pay

## 2019-06-20 ENCOUNTER — Encounter: Payer: Self-pay | Admitting: Gastroenterology

## 2019-06-20 VITALS — BP 153/65 | HR 65 | Temp 96.9°F | Ht 60.0 in | Wt 121.0 lb

## 2019-06-20 DIAGNOSIS — R197 Diarrhea, unspecified: Secondary | ICD-10-CM | POA: Diagnosis not present

## 2019-06-20 NOTE — Patient Instructions (Signed)
I would like to try 1 to 2 teaspoons of Benefiber daily. We can increase if needed, but let's start off slow. I am hoping this will help regulate more. If it doesn't, we will come up with an alternative plan.   Limit Imodium unless significant episodes of diarrhea.  We will see you in 2-3 months or sooner if needed!  It was a pleasure to see you today. I want to create trusting relationships with patients to provide genuine, compassionate, and quality care. I value your feedback. If you receive a survey regarding your visit,  I greatly appreciate you taking time to fill this out.   Annitta Needs, PhD, ANP-BC [facility]

## 2019-06-20 NOTE — Progress Notes (Signed)
Referring Provider: Maryruth Hancock, MD Primary Care Physician:  Maryruth Hancock, MD Primary GI: Dr. Gala Romney   Chief Complaint  Patient presents with  . Diarrhea    once or twice/week; Questran doesn't help    HPI:   Angela Galvan is an 86 y.o. female presenting today with a history of chronic diarrhea, fecal incontinence. Chronic history of GERD, with manometry and pH/impedance without reflux during study, good esophageal motility. Known Zenker's. Anorectal manometry in August last year with weak sphincter muscles, poor coordination with BMs. Referred to PT but is holding off on this time.   Doesn't have diarrhea everyday, but she has it 2-3 times per week. Will wear her out. Will be watery. Will have several occasions with each episodes. Sometimes just once. Will take liquid Imodium. Will take Librax as needed. If not having loose stool, has very small balls for BMs.  Will be skipping days without BMs. Sometimes 2-3 days. Not taking Questran. Feels not helpful. Some days without BMs at all in a row, then loose stool.   Husband passed away in 02-26-2019.     Past Medical History:  Diagnosis Date  . Anxiety   . Arthritis   . Asthma   . Cancer [facility])    breast cancer left with mastectomy  . Cataract   . 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 Depression   . Diabetes mellitus    no meds.-diet controlled  . Diarrhea   . Diverticulitis    s/p colectomy in 1991  . GERD (gastroesophageal reflux disease)   . HOH (hard of hearing)   . HTN (hypertension)    Does not see a cardiologist  . Hyperlipidemia   . Hypothyroidism   . IBS (irritable bowel syndrome)   . S/P colonoscopy 2003   minimal internal hemorrhoids, pancolonic diverticula, biopsy of rectum: lymphoplasmacytic microscopic colitis, colon biopsies normal  . S/P colonoscopy 2012   pancolonic diverticula, nl TI, external hemorrhoidal  tag,/rectum bx no abnormalities  . S/P endoscopy 2012   normal esophagus, stenotic pyloric channel, TTS dilation  . Stroke ([place]) 08/10/2006   no residual    Past Surgical History:  Procedure Laterality Date  . ABDOMINAL HYSTERECTOMY  1983  . ANAL RECTAL MANOMETRY N/A 01/06/2019   Procedure: ANO RECTAL MANOMETRY;  Surgeon: Mauri Pole, MD;  [facility]: [facility];  Service: Endoscopy;  Laterality: N/A;  . bilateral cataracts    . BIOPSY  03/23/2016   Procedure: BIOPSY;  Surgeon: Daneil Dolin, MD;  [facility];  Service: Endoscopy;;  colon   . BREAST BIOPSY Left 01/10/2013   Procedure: BREAST BIOPSY;  Surgeon: Jamesetta So, MD;  Location: [facility];  Service: General;  Laterality: Left;  . BREAST LUMPECTOMY Right 01/29/2015   Procedure: RIGHT BREAST LUMPECTOMY;  Surgeon: Fanny Skates, MD;  Location: [place];  Service: General;  Laterality: Right;  . CHOLECYSTECTOMY    . COLECTOMY    . COLONOSCOPY  09/11/01   Minimal internal hemorrhoids, otherwise normal rectum . Scattered pancolonic diverticula (few) The colonic mucosa otherwise appeared normal status post biopsies and stool sampling as described above.  . COLONOSCOPY N/A 03/23/2016   left colonic diverticulosis, otherwise normal.   . ESOPHAGEAL MANOMETRY N/A 05/13/2018   Procedure: ESOPHAGEAL MANOMETRY (EM);  Surgeon: Mauri Pole, MD;  [facility];  Service: Endoscopy;  Laterality: N/A;  Dr Julious Payer  . ESOPHAGOGASTRODUODENOSCOPY  10/11/2010   Normal esophagus, small hiatal hernia,Stenotic pyloric channel likely critical.  Likely contributing to some of the patient's symptoms, status post TTS balloon dilation Normal D1-D3 status post biopsy.  D2-D3 atrophic-appearing gastric mucosa status post biopsy  . ileocolonoscopy  10/11/2010   external hemorrhoidal tag.  Normal rectum status post biopsy Pancolonic diverticula status post segmental biopsy.  Normal  terminal ileum  . JOINT  REPLACEMENT Bilateral   . KNEE ARTHROPLASTY Right 2004  . KNEE SURGERY Bilateral    2004- [facility]- total; knee  . [place] IMPEDANCE STUDY N/A 05/13/2018   Procedure: [place] IMPEDANCE STUDY;  Surgeon: Mauri Pole, MD;  [facility];  Service: Endoscopy;  Laterality: N/A;  On PPI  . right ovarian tumor removed as teenager    . SIMPLE MASTECTOMY WITH AXILLARY SENTINEL NODE BIOPSY Left 03/07/2013   Procedure: TOTAL  MASTECTOMY WITH AXILLARY SENTINEL NODE BIOPSY;  Surgeon: Adin Hector, MD;  Location: [place];  Service: General;  Laterality: Left;  . TOTAL KNEE ARTHROPLASTY  09/25/2011   Procedure: TOTAL KNEE ARTHROPLASTY;  Surgeon: Gearlean Alf, MD;  [facility];  Service: Orthopedics;  Laterality: Left;    Current Outpatient Medications  Medication Sig Dispense Refill  . acetaminophen (TYLENOL) 500 MG tablet Take 500-1,000 mg by mouth every 6 (six) hours as needed. Pain     . albuterol (PROVENTIL HFA;VENTOLIN HFA) 108 (90 Base) MCG/ACT inhaler Inhale 2 puffs into the lungs every 6 (six) hours as needed for wheezing or shortness of breath. 1 Inhaler 3  . ALPRAZolam (XANAX) 0.5 MG tablet Take 0.5 mg by mouth 2 (two) times daily as needed for sleep or anxiety.    Marland Kitchen aspirin 325 MG tablet Take 325 mg by mouth daily.    Marland Kitchen atorvastatin (LIPITOR) 10 MG tablet Take 1 tablet (10 mg total) by mouth daily after breakfast. 90 tablet 0  . benzonatate (TESSALON) 100 MG capsule TAKE 2 CAPSULES THREE TIMES DAILY AS NEEDED. 20 capsule 1  . clidinium-chlordiazePOXIDE (LIBRAX) 5-2.5 MG capsule Use once daily in the morning. Further doses as needed up to 4 a day 40 capsule 5  . FLUoxetine (PROZAC) 20 MG capsule Take 1 capsule (20 mg total) by mouth daily. 30 capsule 0  . fluticasone (FLONASE) 50 MCG/ACT nasal spray Place into both nostrils daily.    . fluticasone furoate-vilanterol (BREO ELLIPTA) 100-25 MCG/INH AEPB INHALE 1 PUFF INTO THE LUNGS DAILY. 60 each 6  . gabapentin (NEURONTIN) 300 MG  capsule TAKE 1 CAPSULE BY MOUTH 3 TIMES A DAY AS NEEDED 90 capsule 3  . levothyroxine (SYNTHROID) 50 MCG tablet TAKE 1&1/2 TABLETS DAILY BEFORE BREAKFAST. (Patient taking differently: Take 50 mcg by mouth daily. ) 90 tablet 0  . loperamide (IMODIUM) 2 MG capsule Take 2 mg by mouth as needed for diarrhea or loose stools.    Marland Kitchen losartan (COZAAR) 100 MG tablet Take 1 tablet (100 mg total) by mouth daily. 90 tablet 0  . Probiotic Product (ALIGN PO) Take 1 tablet by mouth daily.     . RABEprazole (ACIPHEX) 20 MG tablet TAKE 1 TABLET BY MOUTH 2 TIMES DAILY 180 tablet 2  . UNABLE TO FIND Rx: L8000- Post Surgical Bras (Quantity: 6) [id number]- Silicone Breast Prosthesis (Quantity: 1) Dx: 174.9; Left Mastectomy 1 each 0  . UNABLE TO FIND Rx: [id number]- Mastectomy Form, left (Quantity: 1) Dx: 174.9; Left mastectomy 1 each 0   No current facility-administered medications  for this visit.    Allergies as of 06/20/2019  . (No Known Allergies)    Family History  Problem Relation Age of Onset  . Stroke Mother        deceased after 3 strokes 1999-08-23  . Diabetes Father   . Heart attack Father   . Stroke Maternal Grandmother        deceased after 3 strokes  . Stroke Sister        twin sister  . Colon cancer Neg Hx     Social History   Socioeconomic History  . Marital status: Widowed    Spouse name: Not on file  . Number of children: Not on file  . Years of education: Not on file  . Highest education level: Not on file  Occupational History  . Occupation: retired    Fish farm manager: RETIRED  Tobacco Use  . Smoking status: Never Smoker  . Smokeless tobacco: Never Used  . Tobacco comment: Never smoked  Substance and Sexual Activity  . Alcohol use: Yes    Alcohol/week: 1.0 standard drinks    Types: 1 Glasses of wine per week    Comment: occassional wine  . Drug use: No  . Sexual activity: Never  Other Topics Concern  . Not on file  Social History Narrative  . Not on file   Social Determinants of  Health   Financial Resource Strain:   . Difficulty of Paying Living Expenses: Not on file  Food Insecurity:   . Worried About Charity fundraiser in the Last Year: Not on file  . Ran Out of Food in the Last Year: Not on file  Transportation Needs:   . Lack of Transportation (Medical): Not on file  . Lack of Transportation (Non-Medical): Not on file  Physical Activity:   . Days of Exercise per Week: Not on file  . Minutes of Exercise per Session: Not on file  Stress:   . Feeling of Stress : Not on file  Social Connections:   . Frequency of Communication with Friends and Family: Not on file  . Frequency of Social Gatherings with Friends and Family: Not on file  . Attends Religious Services: Not on file  . Active Member of Clubs or Organizations: Not on file  . Attends Archivist Meetings: Not on file  . Marital Status: Not on file    Review of Systems: Gen: Denies fever, chills, anorexia. Denies fatigue, weakness, weight loss.  CV: Denies chest pain, palpitations, syncope, peripheral edema, and claudication. Resp: Denies dyspnea at rest, cough, wheezing, coughing up blood, and pleurisy. GI: see HPI Derm: Denies rash, itching, dry skin Psych: Denies depression, anxiety, memory loss, confusion. No homicidal or suicidal ideation.  Heme: Denies bruising, bleeding, and enlarged lymph nodes.  Physical Exam: BP (!) 153/65   Pulse 65   Temp (!) 96.9 F (36.1 C) (Temporal)   Ht 5' (1.524 m)   Wt 121 lb (54.9 kg)   BMI 23.63 kg/m  General:   Alert and oriented. No distress noted. Pleasant and cooperative.  Head:  Normocephalic and atraumatic. Abdomen:  +BS, soft, non-tender and non-distended. No rebound or guarding. No HSM or masses noted. Msk:  Slight kyphosis Extremities:  Without edema. Neurologic:  Alert and  oriented x4 Psych:  Alert and cooperative. Normal mood and affect.  ASSESSMENT: Angela Galvan is an 86 y.o. female presenting today with chronic intermittent  diarrhea, previously on Questran without improvement and now taking Imodium and Librax  as needed. Upon further discussion with patient, she tends to be on the more constipated side, going days without BM or just small bristol stool scale #1 output, resulting in diarrhea after several days of limited output. After taking Imodium and Librax, she is back to limited BMs.   Discussed with patient she may actually be more on the constipated side. I do note that she has been sensitive to fiber foods before (noting an apple caused loose stool), but I would like to try regulating her BMs and avoiding anti-diarrheal, anti-spasmodic unless absolutely necessary, as this seems to overshoot the mark. Will trial very low dose of Benefiber daily, call with progress report, and return in 2-3 months. Goal is soft BM daily to every other day.    PLAN:  Hold off on Imodium and Librax unless absolutely needed  Start low dose Benefiber daily. May increase as needed (1-2 teaspoons to start very sparingly)  Call with update  Return in 2-3 months   Annitta Needs, PhD, [facility]

## 2019-07-08 ENCOUNTER — Ambulatory Visit: Payer: Medicare Other | Admitting: Pulmonary Disease

## 2019-07-11 ENCOUNTER — Other Ambulatory Visit: Payer: Self-pay | Admitting: Family Medicine

## 2019-07-11 MED ORDER — LEVOTHYROXINE SODIUM 75 MCG PO TABS: 75.0000 ug | ORAL_TABLET | Freq: Every day | ORAL | 3 refills | Status: AC

## 2019-07-29 ENCOUNTER — Other Ambulatory Visit: Payer: Self-pay | Admitting: Pulmonary Disease

## 2019-08-25 ENCOUNTER — Other Ambulatory Visit: Payer: Self-pay

## 2019-08-25 ENCOUNTER — Ambulatory Visit (INDEPENDENT_AMBULATORY_CARE_PROVIDER_SITE_OTHER): Payer: Medicare Other | Admitting: Pulmonary Disease

## 2019-08-25 DIAGNOSIS — R053 Chronic cough: Secondary | ICD-10-CM

## 2019-08-25 DIAGNOSIS — R490 Dysphonia: Secondary | ICD-10-CM

## 2019-08-25 DIAGNOSIS — R05 Cough: Secondary | ICD-10-CM

## 2019-08-25 MED ORDER — FLUTICASONE PROPIONATE 50 MCG/ACT NA SUSP: 1.0000 | Freq: Every day | NASAL | 12 refills | Status: DC

## 2019-08-25 MED ORDER — BREO ELLIPTA 100-25 MCG/INH IN AEPB: INHALATION_SPRAY | RESPIRATORY_TRACT | 12 refills | Status: DC

## 2019-08-25 NOTE — Patient Instructions (Signed)
Hoarseness Evaluated by ENT. Has not been able to go due to being unable to drive.  Try to start therapy once you are able to get transportation to your appointments   Chronic cough REFILL Breo 1 puff daily CONTINUE gabapentin as directed  Allergic Rhinitis --REFILL Flonase Nasal spray. Administer 1 spray per nostril daily --CONTINUE loratadine (Claritin) 10 mg daily

## 2019-08-25 NOTE — Progress Notes (Signed)
Virtual Visit via Telephone Note  I connected with Angela Galvan on 08/25/19 at  1:30 PM EDT by telephone and verified that I am speaking with the correct person using two identifiers.  Location: Patient: Home Provider: [facility]   I discussed the limitations, risks, security and privacy concerns of performing an evaluation and management service by telephone and the availability of in person appointments. I also discussed with the patient that there may be a patient responsible charge related to this service. The patient expressed understanding and agreed to proceed.  I discussed the assessment and treatment plan with the patient. The patient was provided an opportunity to ask questions and all were answered. The patient agreed with the plan and demonstrated an understanding of the instructions.   The patient was advised to call back or seek an in-person evaluation if the symptoms worsen or if the condition fails to improve as anticipated.  I provided 21 minutes of non-face-to-face time during this encounter.   Ithiel Liebler Rodman Pickle, MD    Synopsis: Referred in 04/2018 for chronic cough  Subjective:   PATIENT ID: Angela Galvan: female DOB: 1933-01-22, MRN: [id number]   HPI  Chief Complaint  Patient presents with  . Follow-up    hoarsness/cough improved.  Dry cough.  sob with exertion.  Using breo, feels it is helping.   [name] is a 86 year old female never smoker with type 2 diabetes, osteoarthritis, GERD presents for follow-up.  She has previously seen me for cough and has been treated with Breo and gabapentin.  On her last visit we tried to reduce her Breo to every other day however she noticed increased coughing and had a telemetry visit with PCCM-NP.  Advised to resume daily Breo and her cough has improved.  Continues to remain unproductive but is not near the severity as it was before.  Her primary concern for this visit is her persistent  hoarseness.  She has an appointment with ENT scheduled for next week.  She used to sing in choir at church however since she had developed her cough, she has not been able to participate.  She notices that after a few minutes of talking she can lose her voice.  Her hoarseness worsens with emotion.  Denies audible wheezing or shortness of breath.  She takes Claritin for her allergies.  Denies rhinorrhea.  Reports that her reflux is well controlled.  Interval She reports improved cough on Breo, fluticasone and gabapentin daily. She has been evaluated by ENT and told that she has "tight" cords. She has been advised to participate in therapy but since her husband passed, she has not had anyone to drive her. She is planning to move in with family eventually but not sure if they have time to drive her to her appointments. For now, her symptoms of hoarseness and cough are tolerable.  Environmental exposures: None. 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   I have personally reviewed patient's past medical/family/social history.  Past Medical History:  Diagnosis Date  . Anxiety   . Arthritis   . Asthma   . Cancer [facility])    breast cancer left with mastectomy  . Cataract   . 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 Depression   . Diabetes mellitus    no meds.-diet controlled  . Diarrhea   . Diverticulitis    s/p colectomy in  1991  . GERD (gastroesophageal reflux disease)   . HOH (hard of hearing)   . HTN (hypertension)    Does not see a cardiologist  . Hyperlipidemia   . Hypothyroidism   . IBS (irritable bowel syndrome)   . S/P colonoscopy 2003   minimal internal hemorrhoids, pancolonic diverticula, biopsy of rectum: lymphoplasmacytic microscopic colitis, colon biopsies normal  . S/P colonoscopy 2012   pancolonic diverticula, nl TI, external hemorrhoidal tag,/rectum bx no abnormalities  . S/P endoscopy 2012    normal esophagus, stenotic pyloric channel, TTS dilation  . Stroke ([employer]) 08/10/2006   no residual     Outpatient Medications Prior to Visit  Medication Sig Dispense Refill  . acetaminophen (TYLENOL) 500 MG tablet Take 500-1,000 mg by mouth every 6 (six) hours as needed. Pain     . albuterol (VENTOLIN HFA) 108 (90 Base) MCG/ACT inhaler INHALE 2 PUFFS EVERY 6 HOURS AS NEEDED FOR WHEEZING OR SHORTNESS OF BREATH 8.5 g 3  . ALPRAZolam (XANAX) 0.5 MG tablet Take 0.5 mg by mouth 2 (two) times daily as needed for sleep or anxiety.    Marland Kitchen aspirin 325 MG tablet Take 325 mg by mouth daily.    Marland Kitchen atorvastatin (LIPITOR) 10 MG tablet Take 1 tablet (10 mg total) by mouth daily after breakfast. 90 tablet 0  . benzonatate (TESSALON) 100 MG capsule TAKE 2 CAPSULES THREE TIMES DAILY AS NEEDED. 20 capsule 1  . clidinium-chlordiazePOXIDE (LIBRAX) 5-2.5 MG capsule Use once daily in the morning. Further doses as needed up to 4 a day 40 capsule 5  . FLUoxetine (PROZAC) 20 MG capsule Take 1 capsule (20 mg total) by mouth daily. 30 capsule 0  . fluticasone (FLONASE) 50 MCG/ACT nasal spray Place into both nostrils daily.    . fluticasone furoate-vilanterol (BREO ELLIPTA) 100-25 MCG/INH AEPB INHALE 1 PUFF INTO THE LUNGS DAILY. 60 each 6  . gabapentin (NEURONTIN) 300 MG capsule TAKE 1 CAPSULE BY MOUTH 3 TIMES A DAY AS NEEDED 90 capsule 3  . levothyroxine (SYNTHROID) 75 MCG tablet Take 1 tablet (75 mcg total) by mouth daily. 90 tablet 3  . loperamide (IMODIUM) 2 MG capsule Take 2 mg by mouth as needed for diarrhea or loose stools.    Marland Kitchen losartan (COZAAR) 100 MG tablet Take 1 tablet (100 mg total) by mouth daily. 90 tablet 0  . Probiotic Product (ALIGN PO) Take 1 tablet by mouth daily.     . RABEprazole (ACIPHEX) 20 MG tablet TAKE 1 TABLET BY MOUTH 2 TIMES DAILY 180 tablet 2  . UNABLE TO FIND Rx: L8000- Post Surgical Bras (Quantity: 6) [id number]- Silicone Breast Prosthesis (Quantity: 1) Dx: 174.9; Left Mastectomy 1 each 0  .  UNABLE TO FIND Rx: [id number]- Mastectomy Form, left (Quantity: 1) Dx: 174.9; Left mastectomy 1 each 0   No facility-administered medications prior to visit.    Review of Systems  Constitutional: Negative for chills, diaphoresis, fever, malaise/fatigue and weight loss.  HENT: Negative for congestion.        Hoarseness  Respiratory: Negative for cough, hemoptysis, sputum production, shortness of breath and wheezing.   Cardiovascular: Negative for chest pain, palpitations and leg swelling.     Objective:    There were no vitals filed for this visit.    Physical Exam: No apparent distress.   Chest imaging:  CXR 03/06/18 No acute infiltrate, effusion or edema  CT Chest 06/20/18 Normal lung airways and parenchyma. No infiltrate, effusion or edema.  PFT:  06/06/18- FEV1/FVC 83 FEV1  1.42 (90%) FVC 1.71 (80%). TLC 70 No sig BD. DLCO 56%.  Borderline mild restrictive defect with reduced DLCO may suggest early ILD. Will need to clinically correlate  Esophageal Manometry 05/13/18: No esophageal abnormality  PH Study 05/13/18: Good acid suppression on PPI  I have personally reviewed the above labs, images and tests noted above.    Assessment & Plan:   Hoarseness Chronic cough  Discussion: 86 year old female never smoker with NIDDM and reflux who presents for follow-up of chronic cough and hoarseness. Cough is well-controlled on ICS inhaler and fluticasone. She continues to have chronic hoarseness and followed by ENT. ENT recommends therapy however she is unable to complete at this time due to social circumstances.  Hoarseness Evaluated by ENT. Has not been able to go due to being unable to drive.  Try to start therapy once you are able to get transportation to your appointments   Chronic cough REFILL Breo 1 puff daily CONTINUE gabapentin as directed  Allergic Rhinitis --REFILL Flonase Nasal spray. Administer 1 spray per nostril daily --CONTINUE loratadine (Claritin) 10 mg  daily  No orders of the defined types were placed in this encounter.  Meds ordered this encounter  Medications  . fluticasone (FLONASE) 50 MCG/ACT nasal spray    Sig: Place 1 spray into both nostrils daily.    Dispense:  16 mL    Refill:  12  . fluticasone furoate-vilanterol (BREO ELLIPTA) 100-25 MCG/INH AEPB    Sig: INHALE 1 PUFF INTO THE LUNGS DAILY.    Dispense:  60 each    Refill:  12   Return in about 6 months (around 02/25/2020) for with me.  Kagan Mutchler Rodman Pickle, MD [facility] 08/25/2019 1:38 PM

## 2019-09-02 ENCOUNTER — Ambulatory Visit: Payer: Medicare Other | Admitting: Gastroenterology

## 2019-09-08 ENCOUNTER — Other Ambulatory Visit: Payer: Self-pay | Admitting: Family Medicine

## 2019-09-08 DIAGNOSIS — F419 Anxiety disorder, unspecified: Secondary | ICD-10-CM

## 2019-09-09 ENCOUNTER — Ambulatory Visit: Payer: Medicare Other | Admitting: Family Medicine

## 2019-10-28 ENCOUNTER — Other Ambulatory Visit: Payer: Self-pay | Admitting: Family Medicine

## 2019-10-28 DIAGNOSIS — F419 Anxiety disorder, unspecified: Secondary | ICD-10-CM

## 2019-12-01 ENCOUNTER — Encounter (HOSPITAL_COMMUNITY): Payer: Self-pay | Admitting: Emergency Medicine

## 2019-12-01 ENCOUNTER — Emergency Department (HOSPITAL_COMMUNITY): Payer: Medicare Other

## 2019-12-01 ENCOUNTER — Other Ambulatory Visit: Payer: Self-pay

## 2019-12-01 ENCOUNTER — Ambulatory Visit
Admission: EM | Admit: 2019-12-01 | Discharge: 2019-12-01 | Disposition: A | Discharge status: Home / Self Care | Payer: Medicare Other | Source: Home / Self Care

## 2019-12-01 ENCOUNTER — Emergency Department (HOSPITAL_COMMUNITY)
Admission: EM | Admit: 2019-12-01 | Discharge: 2019-12-01 | Disposition: A | Discharge status: Home / Self Care | Payer: Medicare Other | Attending: Emergency Medicine | Admitting: Emergency Medicine

## 2019-12-01 DIAGNOSIS — S0101XA Laceration without foreign body of scalp, initial encounter: Secondary | ICD-10-CM | POA: Insufficient documentation

## 2019-12-01 DIAGNOSIS — Y999 Unspecified external cause status: Secondary | ICD-10-CM | POA: Insufficient documentation

## 2019-12-01 DIAGNOSIS — Y939 Activity, unspecified: Secondary | ICD-10-CM | POA: Insufficient documentation

## 2019-12-01 DIAGNOSIS — Z79890 Hormone replacement therapy (postmenopausal): Secondary | ICD-10-CM | POA: Insufficient documentation

## 2019-12-01 DIAGNOSIS — Y929 Unspecified place or not applicable: Secondary | ICD-10-CM | POA: Diagnosis not present

## 2019-12-01 DIAGNOSIS — Z7951 Long term (current) use of inhaled steroids: Secondary | ICD-10-CM | POA: Diagnosis not present

## 2019-12-01 DIAGNOSIS — S32040A Wedge compression fracture of fourth lumbar vertebra, initial encounter for closed fracture: Secondary | ICD-10-CM

## 2019-12-01 DIAGNOSIS — Z853 Personal history of malignant neoplasm of breast: Secondary | ICD-10-CM | POA: Insufficient documentation

## 2019-12-01 DIAGNOSIS — W19XXXA Unspecified fall, initial encounter: Secondary | ICD-10-CM

## 2019-12-01 DIAGNOSIS — J45909 Unspecified asthma, uncomplicated: Secondary | ICD-10-CM | POA: Diagnosis not present

## 2019-12-01 DIAGNOSIS — S0990XA Unspecified injury of head, initial encounter: Secondary | ICD-10-CM

## 2019-12-01 DIAGNOSIS — Z7982 Long term (current) use of aspirin: Secondary | ICD-10-CM | POA: Insufficient documentation

## 2019-12-01 DIAGNOSIS — Z8673 Personal history of transient ischemic attack (TIA), and cerebral infarction without residual deficits: Secondary | ICD-10-CM | POA: Diagnosis not present

## 2019-12-01 DIAGNOSIS — E119 Type 2 diabetes mellitus without complications: Secondary | ICD-10-CM | POA: Diagnosis not present

## 2019-12-01 DIAGNOSIS — W108XXA Fall (on) (from) other stairs and steps, initial encounter: Secondary | ICD-10-CM | POA: Insufficient documentation

## 2019-12-01 DIAGNOSIS — Z96652 Presence of left artificial knee joint: Secondary | ICD-10-CM | POA: Diagnosis not present

## 2019-12-01 DIAGNOSIS — E039 Hypothyroidism, unspecified: Secondary | ICD-10-CM | POA: Insufficient documentation

## 2019-12-01 DIAGNOSIS — S32048A Other fracture of fourth lumbar vertebra, initial encounter for closed fracture: Secondary | ICD-10-CM | POA: Insufficient documentation

## 2019-12-01 DIAGNOSIS — I10 Essential (primary) hypertension: Secondary | ICD-10-CM | POA: Diagnosis not present

## 2019-12-01 DIAGNOSIS — Z9012 Acquired absence of left breast and nipple: Secondary | ICD-10-CM | POA: Insufficient documentation

## 2019-12-01 DIAGNOSIS — S3992XA Unspecified injury of lower back, initial encounter: Secondary | ICD-10-CM | POA: Diagnosis present

## 2019-12-01 NOTE — ED Notes (Signed)
Pt states she does not remember falling. Pt with 2 small lacerations to right side of head, bleeding controlled. Pt with bruise to right side of forehead. Pt states she has had multiple falls and unsure of why she is falling.

## 2019-12-01 NOTE — ED Notes (Signed)
Patient is being discharged from the Urgent Care and sent to the Emergency Department via pov. Per B. Wurst, patient is in need of higher level of care due to head injury w/ loc. Patient is aware and verbalizes understanding of plan of care. There were no vitals filed for this visit.

## 2019-12-01 NOTE — ED Provider Notes (Signed)
[facility] EMERGENCY DEPARTMENT Provider Note   CSN: [id number] Arrival date & time: 12/01/19  1858     History Chief Complaint  Patient presents with   Fall    Angela Galvan is a 86 y.o. female.  Patient with a fall coming out of the steps onto a carport.  No loss of consciousness.  Patient with 2 small lacerations to the right side of her head.  For previous fall has a bruise to the left hip.  And complaining of low back pain.  Patient is apparently had 3 falls here recently.  Patient not on blood thinners patient with complaint of pain to the left hip and lower back area.  Patient states tetanus is up-to-date.  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   Grade III, 3.5 cm, ER-/PR- left breast s/p mastectomy with SLN (0/1)   Depression    Diabetes mellitus    no meds.-diet controlled   Diarrhea    Diverticulitis    s/p colectomy in 1991   GERD (gastroesophageal reflux disease)    HOH (hard of hearing)    HTN (hypertension)    Does not see a cardiologist   Hyperlipidemia    Hypothyroidism    IBS (irritable bowel syndrome)    S/P colonoscopy 2003   minimal internal hemorrhoids, pancolonic diverticula, biopsy of rectum: lymphoplasmacytic microscopic colitis, colon biopsies normal   S/P colonoscopy 2012   pancolonic diverticula, nl TI, external hemorrhoidal tag,/rectum bx no abnormalities   S/P endoscopy 2012   normal esophagus, stenotic pyloric channel, TTS dilation   Stroke ([place]) 08/10/2006   no residual    Patient Active Problem List   Diagnosis Date Noted   Hypothyroidism 03/11/2019   Hyperlipidemia 03/11/2019   Menopause 03/11/2019   Encounter for screening for malignant neoplasm of breast 03/11/2019   Need for immunization  against influenza 03/11/2019   Fecal incontinence 12/10/2018   Hoarseness 11/14/2018   Chronic cough    Syncope and collapse 11/03/2016   Type 2 diabetes mellitus without complication ([place])    Breast mass, right 01/29/2015   IBS (irritable bowel syndrome) 10/29/2014   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/07/2013   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   Psychophysiologic insomnia 06/20/2012   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/20/2012   Stiffness of joint, not elsewhere classified, lower leg 10/24/2011   Difficulty in walking(719.7) 10/24/2011   Pain in joint, lower leg 10/24/2011   OA (osteoarthritis) of knee 09/25/2011   GERD (gastroesophageal reflux disease) 09/15/2011   Epigastric pain 09/29/2010   Diarrhea 09/29/2010    Past Surgical History:  Procedure Laterality Date   ABDOMINAL HYSTERECTOMY  1983   ANAL RECTAL MANOMETRY N/A 01/06/2019   Procedure: ANO RECTAL MANOMETRY;  Surgeon: Mauri Pole, MD;  Location: [facility];  Service: Endoscopy;  Laterality: N/A;   bilateral cataracts     BIOPSY  03/23/2016   Procedure: BIOPSY;  Surgeon: Daneil Dolin, MD;  Location: [facility];  Service: Endoscopy;;  colon    BREAST BIOPSY Left 01/10/2013   Procedure: BREAST BIOPSY;  Surgeon: Jamesetta So, MD;  Location: [facility];  Service: General;  Laterality: Left;  BREAST LUMPECTOMY Right 01/29/2015   Procedure: RIGHT BREAST LUMPECTOMY;  Surgeon: Fanny Skates, MD;  Location: [facility];  Service: General;  Laterality: Right;   CHOLECYSTECTOMY     COLECTOMY     COLONOSCOPY  09/11/01   Minimal internal hemorrhoids, otherwise normal rectum . Scattered pancolonic diverticula (few) The colonic mucosa otherwise appeared normal status post biopsies and stool sampling as described above.   COLONOSCOPY N/A 03/23/2016   left colonic diverticulosis, otherwise normal.    ESOPHAGEAL MANOMETRY N/A 05/13/2018   Procedure:  ESOPHAGEAL MANOMETRY (EM);  Surgeon: Mauri Pole, MD;  [facility];  Service: Endoscopy;  Laterality: N/A;  Dr Julious Payer   ESOPHAGOGASTRODUODENOSCOPY  10/11/2010   Normal esophagus, small hiatal hernia,Stenotic pyloric channel likely critical.  Likely contributing to some of the patient's symptoms, status post TTS balloon dilation Normal D1-D3 status post biopsy.  D2-D3 atrophic-appearing gastric mucosa status post biopsy   ileocolonoscopy  10/11/2010   external hemorrhoidal tag.  Normal rectum status post biopsy Pancolonic diverticula status post segmental biopsy.  Normal  terminal ileum   JOINT REPLACEMENT Bilateral    KNEE ARTHROPLASTY Right September 04, 2002   KNEE SURGERY Bilateral    09/04/02- [place]- total; knee   [place] IMPEDANCE STUDY N/A 05/13/2018   Procedure: [place] IMPEDANCE STUDY;  Surgeon: Mauri Pole, MD;  [facility];  Service: Endoscopy;  Laterality: N/A;  On PPI   right ovarian tumor removed as teenager     SIMPLE MASTECTOMY WITH AXILLARY SENTINEL NODE BIOPSY Left 03/07/2013   Procedure: TOTAL  MASTECTOMY WITH AXILLARY SENTINEL NODE BIOPSY;  Surgeon: Adin Hector, MD;  Location: [facility];  Service: General;  Laterality: Left;   TOTAL KNEE ARTHROPLASTY  09/25/2011   Procedure: TOTAL KNEE ARTHROPLASTY;  Surgeon: Gearlean Alf, MD;  [facility];  Service: Orthopedics;  Laterality: Left;     OB History   No obstetric history on file.     Family History  Problem Relation Age of Onset   Stroke Mother        deceased after 3 strokes 09/04/99   Diabetes Father    Heart attack Father    Stroke Maternal Grandmother        deceased after 3 strokes   Stroke Sister        twin sister   Colon cancer Neg Hx     Social History   Tobacco Use   Smoking status: Never Smoker   Smokeless tobacco: Never Used   Tobacco comment: Never smoked  Vaping Use   Vaping Use: Never used  Substance Use Topics   Alcohol use: Yes    Alcohol/week: 1.0  standard drink    Types: 1 Glasses of wine per week    Comment: occassional wine   Drug use: No    Home Medications Prior to Admission medications   Medication Sig Start Date End Date Taking? Authorizing Provider  acetaminophen (TYLENOL) 500 MG tablet Take 500-1,000 mg by mouth every 6 (six) hours as needed for mild pain or moderate pain. Pain    Yes [provider]  albuterol (VENTOLIN HFA) 108 (90 Base) MCG/ACT inhaler INHALE 2 PUFFS EVERY 6 HOURS AS NEEDED FOR WHEEZING OR SHORTNESS OF BREATH Patient taking differently: Inhale 2 puffs into the lungs every 6 (six) hours as needed for wheezing or shortness of breath.  07/29/19  Yes Margaretha Seeds, MD  ALPRAZolam Duanne Moron) 0.5 MG tablet Take 0.5 mg by mouth 2 (two) times daily as needed  for sleep or anxiety.   Yes [provider]  aspirin 325 MG tablet Take 325 mg by mouth daily.   Yes [provider]  atorvastatin (LIPITOR) 10 MG tablet Take 1 tablet (10 mg total) by mouth daily after breakfast. 05/05/19  Yes Corum, Rex Kras, MD  BREO ELLIPTA 200-25 MCG/INH AEPB Inhale 1 puff into the lungs daily. 10/28/19  Yes [provider]  clidinium-chlordiazePOXIDE (LIBRAX) 5-2.5 MG capsule Use once daily in the morning. Further doses as needed up to 4 a day Patient taking differently: Take 1 capsule by mouth daily as needed (constipation).  02/11/19  Yes Carlis Stable, NP  FLUoxetine (PROZAC) 20 MG capsule TAKE ONE CAPSULE BY MOUTH ONCE DAILY. Patient taking differently: Take 20 mg by mouth every morning.  10/28/19  Yes Corum, Rex Kras, MD  fluticasone (FLONASE) 50 MCG/ACT nasal spray Place 1 spray into both nostrils daily. Patient taking differently: Place 1 spray into both nostrils daily as needed for allergies or rhinitis.  08/25/19  Yes Margaretha Seeds, MD  gabapentin (NEURONTIN) 300 MG capsule TAKE 1 CAPSULE BY MOUTH 3 TIMES A DAY AS NEEDED Patient taking differently: Take 300 mg by mouth at bedtime.  06/02/19  Yes Margaretha Seeds, MD  levothyroxine (SYNTHROID) 75 MCG tablet Take 1 tablet (75 mcg total) by mouth daily. Patient taking differently: Take 75 mcg by mouth daily before breakfast.  07/11/19  Yes Corum, Rex Kras, MD  loperamide (IMODIUM) 2 MG capsule Take 2 mg by mouth as needed for diarrhea or loose stools.   Yes [provider]  losartan (COZAAR) 100 MG tablet Take 1 tablet (100 mg total) by mouth daily. 05/05/19  Yes Corum, Rex Kras, MD  RABEprazole (ACIPHEX) 20 MG tablet TAKE 1 TABLET BY MOUTH 2 TIMES DAILY Patient taking differently: Take 20 mg by mouth in the morning and at bedtime.  12/26/18  Yes Mahala Menghini, PA-C  RESTASIS 0.05 % ophthalmic emulsion Place 1 drop into both eyes 2 (two) times daily.  08/21/19  Yes [provider]  fluticasone furoate-vilanterol (BREO ELLIPTA) 100-25 MCG/INH AEPB INHALE 1 PUFF INTO THE LUNGS DAILY. Patient not taking: Reported on 12/01/2019 08/25/19   Margaretha Seeds, MD    Allergies    Patient has no known allergies.  Review of Systems   Review of Systems  Constitutional: Negative for chills and fever.  HENT: Positive for hearing loss. Negative for congestion, rhinorrhea and sore throat.   Eyes: Negative for visual disturbance.  Respiratory: Negative for cough and shortness of breath.   Cardiovascular: Negative for chest pain and leg swelling.  Gastrointestinal: Negative for abdominal pain, diarrhea, nausea and vomiting.  Genitourinary: Negative for dysuria.  Musculoskeletal: Positive for back pain. Negative for neck pain.  Skin: Positive for wound. Negative for rash.  Neurological: Negative for dizziness, light-headedness and headaches.  Hematological: Does not bruise/bleed easily.  Psychiatric/Behavioral: Negative for confusion.    Physical Exam Updated Vital Signs BP (!) 150/57    Pulse 69    Temp 98.2 F (36.8 C) (Oral)    Resp 18    Ht 1.524 m (5')    Wt 54.4 kg    SpO2 98%    BMI 23.44 kg/m   Physical Exam Vitals and nursing note  reviewed.  Constitutional:      General: She is not in acute distress.    Appearance: Normal appearance. She is well-developed.  HENT:     Head: Normocephalic.  Comments: Right parietal area with 2 small lacerations.  Each less than a centimeter.  No active bleeding. Eyes:     Extraocular Movements: Extraocular movements intact.     Conjunctiva/sclera: Conjunctivae normal.     Pupils: Pupils are equal, round, and reactive to light.  Cardiovascular:     Rate and Rhythm: Normal rate and regular rhythm.     Heart sounds: No murmur heard.   Pulmonary:     Effort: Pulmonary effort is normal. No respiratory distress.     Breath sounds: Normal breath sounds.  Abdominal:     Palpations: Abdomen is soft.     Tenderness: There is no abdominal tenderness.  Musculoskeletal:        General: Tenderness and signs of injury present. No swelling. Normal range of motion.     Cervical back: Normal range of motion and neck supple.     Comments: Bruise to the left hip area.  Good range of motion of lower extremities.  Tenderness to palpation lower lumbar area.  Skin:    General: Skin is warm and dry.     Capillary Refill: Capillary refill takes less than 2 seconds.  Neurological:     General: No focal deficit present.     Mental Status: She is alert and oriented to person, place, and time.     Cranial Nerves: No cranial nerve deficit.     Sensory: No sensory deficit.     ED Results / Procedures / Treatments   Labs (all labs ordered are listed, but only abnormal results are displayed) Labs Reviewed - No data to display  EKG None  Radiology DG Lumbar Spine Complete  Result Date: 12/01/2019 CLINICAL DATA:  [place] off porch steps tonight with low back pain. EXAM: LUMBAR SPINE - COMPLETE 4+ VIEW COMPARISON:  None. FINDINGS: Slight curvature of the lumbar spine convex right. Mild spondylosis throughout the lumbar spine to include facet arthropathy over the lower lumbar spine. Mild depression of  the superior endplate of L4 which may be acute or chronic. Mild disc space narrowing at the L3-4 level. Surgical clips over the pelvis and right upper quadrant. IMPRESSION: 1. Mild depression of the superior endplate of L4 which may be acute or chronic. 2. Mild spondylosis of the lumbar spine with mild disc disease at the L3-4 level. Electronically Signed   By: Marin Olp M.D.   On: 12/01/2019 21:09   CT Head Wo Contrast  Result Date: 12/01/2019 CLINICAL DATA:  Fall with right head injury. EXAM: CT HEAD WITHOUT CONTRAST TECHNIQUE: Contiguous axial images were obtained from the base of the skull through the vertex without intravenous contrast. COMPARISON:  11/03/2016 FINDINGS: Brain: Ventricles, cisterns and other CSF spaces are within normal. There is moderate chronic ischemic microvascular disease. There is no mass, mass effect, shift of midline structures or acute hemorrhage. No evidence of acute infarction. Vascular: No hyperdense vessel or unexpected calcification. Skull: Normal. Negative for fracture or focal lesion. Sinuses/Orbits: No acute finding. Other: None. IMPRESSION: 1.  No acute findings. 2.  Moderate chronic ischemic microvascular disease. Electronically Signed   By: Marin Olp M.D.   On: 12/01/2019 21:23   DG Hip Unilat W or Wo Pelvis 2-3 Views Left  Result Date: 12/01/2019 CLINICAL DATA:  [place] from porch steps tonight.  Left hip pain. EXAM: DG HIP (WITH OR WITHOUT PELVIS) 2-3V LEFT COMPARISON:  Left hip 02/28/2016 and 01/21/2015 FINDINGS: Minimal symmetric degenerative change of the hips. 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 No acute fracture or dislocation. Small sclerotic focus over the left ischium unchanged. Mild degenerative change of the spine. Several surgical clips over the pelvis unchanged. IMPRESSION: 1.  No acute findings. 2.  Stable chronic changes over the left proximal femur  and ischium. Electronically Signed   By: Marin Olp M.D.   On: 12/01/2019 21:13    Procedures Procedures (including critical care time)  Medications Ordered in ED Medications - No data to display  ED Course  I have reviewed the triage vital signs and the nursing notes.  Pertinent labs & imaging results that were available during my care of the patient were reviewed by me and considered in my medical decision making (see chart for details).    MDM Rules/Calculators/A&P                           Patient with 3 falls recently.  1 occurred today.  No loss of consciousness not related to syncope.  Patient with 2 small lacerations to the right parietal part.  CT head without any bony abnormalities no brain abnormalities.  X-rays of the pelvis and left hip without any acute injury.  X-ray of the lumbar vertebrae shows evidence of an L4 superior endplate injury.  That probably occurred with 1 of 3 falls.  Patient stable for discharge home follow-up with regular doctor as well as orthopedic referral provided.  Patient states tetanus is up-to-date.  Scalp wounds will heal on their own.  Do not require any suturing.  Patient is nontoxic no acute distress.    Final Clinical Impression(s) / ED Diagnoses Final diagnoses:  Fall, initial encounter  Injury of head, initial encounter  Compression fracture of L4 vertebra, initial encounter [facility])    Rx / DC Orders ED Discharge Orders    None       Fredia Sorrow, MD 12/01/19 2253

## 2019-12-01 NOTE — ED Notes (Signed)
Pt transported to CT ?

## 2019-12-01 NOTE — ED Triage Notes (Signed)
Pt states she went to leave from eating dinner at her daughters house and states "I dont remember falling, all I remember is looking at my legs when I was laying on the ground." pt states she doesn't know forsure but thinks she passed out. Pt has 2 small lacerations on the right side of head and small bruising noted to the right side of head. Bleeding controlled at this time.

## 2019-12-01 NOTE — ED Triage Notes (Signed)
Pt fell off of her daughters porch steps, did have loc.  Pt states when she woke up she was on the ground.  Laceration to RT side of head with no active bleeding. Pt is a&o x 4 and ambulatory.

## 2019-12-01 NOTE — Discharge Instructions (Addendum)
Scalp wounds to heal on their own.  Washing with soap and water.  Place a towel on the pillow there will be some bleeding.  Suture repair not required.  CT head without any acute findings.  X-ray of pelvis and left hip without any bony abnormalities from injury.  Lumbar x-ray shows superior endplate L4 stable fracture.  Is occurred from one of the 3 falls that have occurred recently.  May have not been today.  They can appointment follow-up with orthopedics and follow-up with your regular doctor.  Would recommend Tylenol for the back pain.  Walking will not make the lumbar injury worse.

## 2019-12-02 ENCOUNTER — Emergency Department (HOSPITAL_COMMUNITY)
Admission: EM | Admit: 2019-12-02 | Discharge: 2019-12-02 | Disposition: A | Discharge status: Home / Self Care | Payer: Medicare Other | Attending: Emergency Medicine | Admitting: Emergency Medicine

## 2019-12-02 ENCOUNTER — Other Ambulatory Visit: Payer: Self-pay

## 2019-12-02 ENCOUNTER — Encounter (HOSPITAL_COMMUNITY): Payer: Self-pay | Admitting: Emergency Medicine

## 2019-12-02 DIAGNOSIS — W010XXA Fall on same level from slipping, tripping and stumbling without subsequent striking against object, initial encounter: Secondary | ICD-10-CM | POA: Insufficient documentation

## 2019-12-02 DIAGNOSIS — E119 Type 2 diabetes mellitus without complications: Secondary | ICD-10-CM | POA: Insufficient documentation

## 2019-12-02 DIAGNOSIS — R55 Syncope and collapse: Secondary | ICD-10-CM

## 2019-12-02 DIAGNOSIS — J45909 Unspecified asthma, uncomplicated: Secondary | ICD-10-CM | POA: Diagnosis not present

## 2019-12-02 DIAGNOSIS — I10 Essential (primary) hypertension: Secondary | ICD-10-CM | POA: Insufficient documentation

## 2019-12-02 DIAGNOSIS — Z853 Personal history of malignant neoplasm of breast: Secondary | ICD-10-CM | POA: Insufficient documentation

## 2019-12-02 DIAGNOSIS — E039 Hypothyroidism, unspecified: Secondary | ICD-10-CM | POA: Insufficient documentation

## 2019-12-02 DIAGNOSIS — Y92009 Unspecified place in unspecified non-institutional (private) residence as the place of occurrence of the external cause: Secondary | ICD-10-CM | POA: Insufficient documentation

## 2019-12-02 DIAGNOSIS — Y998 Other external cause status: Secondary | ICD-10-CM | POA: Diagnosis not present

## 2019-12-02 DIAGNOSIS — Y9389 Activity, other specified: Secondary | ICD-10-CM | POA: Insufficient documentation

## 2019-12-02 DIAGNOSIS — S069X9A Unspecified intracranial injury with loss of consciousness of unspecified duration, initial encounter: Secondary | ICD-10-CM | POA: Diagnosis present

## 2019-12-02 DIAGNOSIS — Z96659 Presence of unspecified artificial knee joint: Secondary | ICD-10-CM | POA: Diagnosis not present

## 2019-12-02 LAB — COMPREHENSIVE METABOLIC PANEL
ALT: 31 U/L (ref 0–44)
AST: 35 U/L (ref 15–41)
Albumin: 4.1 g/dL (ref 3.5–5.0)
Alkaline Phosphatase: 111 U/L (ref 38–126)
Anion gap: 13 (ref 5–15)
BUN: 24 mg/dL — ABNORMAL HIGH (ref 8–23)
CO2: 19 mmol/L — ABNORMAL LOW (ref 22–32)
Calcium: 9.4 mg/dL (ref 8.9–10.3)
Chloride: 99 mmol/L (ref 98–111)
Creatinine, Ser: 0.94 mg/dL (ref 0.44–1.00)
GFR calc Af Amer: >60 mL/min (ref >60)
GFR calc non Af Amer: 55 mL/min — ABNORMAL LOW (ref >60)
Glucose, Bld: 184 mg/dL — ABNORMAL HIGH (ref 70–99)
Potassium: 4.8 mmol/L (ref 3.5–5.1)
Sodium: 131 mmol/L — ABNORMAL LOW (ref 135–145)
Total Bilirubin: 1.2 mg/dL (ref 0.3–1.2)
Total Protein: 7.4 g/dL (ref 6.5–8.1)

## 2019-12-02 LAB — CBC WITH DIFFERENTIAL/PLATELET
Abs Immature Granulocytes: 0.22 10*3/uL — ABNORMAL HIGH (ref 0.00–0.07)
Basophils Absolute: 0.1 10*3/uL (ref 0.0–0.1)
Basophils Relative: 1 %
Eosinophils Absolute: 0 10*3/uL (ref 0.0–0.5)
Eosinophils Relative: 0 %
HCT: 41.1 % (ref 36.0–46.0)
Hemoglobin: 13.5 g/dL (ref 12.0–15.0)
Immature Granulocytes: 2 %
Lymphocytes Relative: 15 %
Lymphs Abs: 2.2 10*3/uL (ref 0.7–4.0)
MCH: 30.1 pg (ref 26.0–34.0)
MCHC: 32.8 g/dL (ref 30.0–36.0)
MCV: 91.5 fL (ref 80.0–100.0)
Monocytes Absolute: 1.4 10*3/uL — ABNORMAL HIGH (ref 0.1–1.0)
Monocytes Relative: 9 %
Neutro Abs: 11 10*3/uL — ABNORMAL HIGH (ref 1.7–7.7)
Neutrophils Relative %: 73 %
Platelets: 354 10*3/uL (ref 150–400)
RBC: 4.49 MIL/uL (ref 3.87–5.11)
RDW: 13 % (ref 11.5–15.5)
WBC: 15 10*3/uL — ABNORMAL HIGH (ref 4.0–10.5)
nRBC: 0 % (ref 0.0–0.2)

## 2019-12-02 LAB — CBG MONITORING, ED: Glucose-Capillary: 175 mg/dL — ABNORMAL HIGH (ref 70–99)

## 2019-12-02 LAB — URINALYSIS, ROUTINE W REFLEX MICROSCOPIC
Bilirubin Urine: NEGATIVE
Glucose, UA: NEGATIVE mg/dL
Ketones, ur: 5 mg/dL — AB
Leukocytes,Ua: NEGATIVE
Nitrite: NEGATIVE
Protein, ur: NEGATIVE mg/dL
Specific Gravity, Urine: 1.014 (ref 1.005–1.030)
pH: 5 (ref 5.0–8.0)

## 2019-12-02 LAB — MAGNESIUM: Magnesium: 2.1 mg/dL (ref 1.7–2.4)

## 2019-12-02 MED ORDER — ONDANSETRON HCL 4 MG/2ML IJ SOLN
4.0000 mg | Freq: Once | INTRAMUSCULAR | Status: AC
  Administered 2019-12-02: 4 mg via INTRAVENOUS
  Filled 2019-12-02: qty 2

## 2019-12-02 MED ORDER — LACTATED RINGERS IV BOLUS
1000.0000 mL | Freq: Once | INTRAVENOUS | Status: AC
  Administered 2019-12-02: 1000 mL via INTRAVENOUS

## 2019-12-02 NOTE — ED Triage Notes (Signed)
Pt recently discharged from ED for fall and syncope. Per EMS family got pt home and was helping her to bed when she had another syncopal episode.

## 2019-12-02 NOTE — ED Provider Notes (Signed)
Emergency Department Provider Note  I have reviewed the triage vital signs and the nursing notes.  HISTORY  Chief Complaint Loss of Consciousness and Fall   HPI Angela Galvan is a 86 y.o. female who presents to the emerge department today secondary to a syncopal episode.  Patient is with her daughter and together they provide the history that she had gone home after being evaluated here for a fall.  She was walking to her bedroom and stated that she felt lightheaded so her daughter helped her into her bedroom.  The patient sat in the bed and subsequently fell backwards on the bed and was having stiff arms and unresponsiveness for 3 to 4 minutes along with some mild confusion for 5 or 10 minutes afterwards.  She had will episodes of vomiting during that as well.  When EMS got there blood pressure was low apparently.  Was given some fluids but then and her symptoms improved significantly at this time she is asymptomatic.  No recent illnesses.  Had a head CT on her recent visit.  Daughter also states she has a large hematoma to her left hip. Hip XR previously without fracture.    No other associated or modifying symptoms.    Past Medical History:  Diagnosis Date  . Anxiety   . Arthritis   . Asthma   . Cancer [facility])    breast cancer left with mastectomy  . Cataract   . 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 Depression   . Diabetes mellitus    no meds.-diet controlled  . Diarrhea   . Diverticulitis    s/p colectomy in 1991  . GERD (gastroesophageal reflux disease)   . HOH (hard of hearing)   . HTN (hypertension)    Does not see a cardiologist  . Hyperlipidemia   . Hypothyroidism   . IBS (irritable bowel syndrome)   . S/P colonoscopy 2003   minimal internal hemorrhoids, pancolonic diverticula, biopsy of rectum: lymphoplasmacytic microscopic colitis, colon biopsies normal  . S/P colonoscopy  2012   pancolonic diverticula, nl TI, external hemorrhoidal tag,/rectum bx no abnormalities  . S/P endoscopy 2012   normal esophagus, stenotic pyloric channel, TTS dilation  . Stroke ([place]) 08/10/2006   no residual    Patient Active Problem List   Diagnosis Date Noted  . Hypothyroidism 03/11/2019  . Hyperlipidemia 03/11/2019  . Menopause 03/11/2019  . Encounter for screening for malignant neoplasm of breast 03/11/2019  . Need for immunization against influenza 03/11/2019  . Fecal incontinence 12/10/2018  . Hoarseness 11/14/2018  . Chronic cough   . Syncope and collapse 11/03/2016  . Type 2 diabetes mellitus without complication ([place])   . Breast mass, right 01/29/2015  . IBS (irritable bowel syndrome) 10/29/2014  . 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/07/2013  . Paget's disease of left breast ([place]) 01/24/2013  . Psychophysiologic insomnia 06/20/2012  . 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 Stiffness of joint, not elsewhere classified, lower leg 10/24/2011  . Difficulty in walking(719.7) 10/24/2011  . Pain in joint, lower leg 10/24/2011  . OA (osteoarthritis) of knee 09/25/2011  . GERD (gastroesophageal reflux disease) 09/15/2011  . Epigastric pain 09/29/2010  . Diarrhea 09/29/2010    Past Surgical History:  Procedure Laterality Date  . ABDOMINAL HYSTERECTOMY  1983  . ANAL RECTAL MANOMETRY N/A 01/06/2019   Procedure: ANO RECTAL MANOMETRY;  Surgeon:  Mauri Pole, MD;  Location: [facility];  Service: Endoscopy;  Laterality: N/A;  . bilateral cataracts    . BIOPSY  03/23/2016   Procedure: BIOPSY;  Surgeon: Daneil Dolin, MD;  Location: [facility];  Service: Endoscopy;;  colon   . BREAST BIOPSY Left 01/10/2013   Procedure: BREAST BIOPSY;  Surgeon: Jamesetta So, MD;  Location: [facility];  Service: General;  Laterality: Left;  . BREAST LUMPECTOMY Right 01/29/2015   Procedure: RIGHT BREAST LUMPECTOMY;  Surgeon: Fanny Skates, MD;  Location: [facility];  Service: General;  Laterality: Right;  . CHOLECYSTECTOMY    . COLECTOMY    . COLONOSCOPY  09/11/01   Minimal internal hemorrhoids, otherwise normal rectum . Scattered pancolonic diverticula (few) The colonic mucosa otherwise appeared normal status post biopsies and stool sampling as described above.  . COLONOSCOPY N/A 03/23/2016   left colonic diverticulosis, otherwise normal.   . ESOPHAGEAL MANOMETRY N/A 05/13/2018   Procedure: ESOPHAGEAL MANOMETRY (EM);  Surgeon: Mauri Pole, MD;  Location: [facility];  Service: Endoscopy;  Laterality: N/A;  Dr Julious Payer  . ESOPHAGOGASTRODUODENOSCOPY  10/11/2010   Normal esophagus, small hiatal hernia,Stenotic pyloric channel likely critical.  Likely contributing to some of the patient's symptoms, status post TTS balloon dilation Normal D1-D3 status post biopsy.  D2-D3 atrophic-appearing gastric mucosa status post biopsy  . ileocolonoscopy  10/11/2010   external hemorrhoidal tag.  Normal rectum status post biopsy Pancolonic diverticula status post segmental biopsy.  Normal  terminal ileum  . JOINT REPLACEMENT Bilateral   . KNEE ARTHROPLASTY Right 2002/09/09  . KNEE SURGERY Bilateral    September 09, 2002- [facility]- total; knee  . [place] IMPEDANCE STUDY N/A 05/13/2018   Procedure: [place] IMPEDANCE STUDY;  Surgeon: Mauri Pole, MD;  Location: [facility];  Service: Endoscopy;  Laterality: N/A;  On PPI  . right ovarian tumor removed as teenager    . SIMPLE MASTECTOMY WITH AXILLARY SENTINEL NODE BIOPSY Left 03/07/2013   Procedure: TOTAL  MASTECTOMY WITH AXILLARY SENTINEL NODE BIOPSY;  Surgeon: Adin Hector, MD;  Location: [facility];  Service: General;  Laterality: Left;  . TOTAL KNEE ARTHROPLASTY  09/25/2011   Procedure: TOTAL KNEE ARTHROPLASTY;  Surgeon: Gearlean Alf, MD;  Location: [facility];  Service: Orthopedics;  Laterality: Left;    Current Outpatient Rx  . Order #: [id number] Class: Historical Med  . Order #: [id number] Class: Normal  . Order #:  [id number] Class: Historical Med  . Order #: [id number] Class: Historical Med  . Order #: [id number] Class: Normal  . Order #: [id number] Class: Historical Med  . Order #: [id number] Class: Normal  . Order #: [id number] Class: Normal  . Order #: [id number] Class: Normal  . Order #: [id number] Class: Normal  . Order #: [id number] Class: Normal  . Order #: [id number] Class: Normal  . Order #: [id number] Class: Historical Med  . Order #: [id number] Class: Normal  . Order #: [id number] Class: Normal  . Order #: [id number] Class: Historical Med    Allergies Patient has no known allergies.  Family History  Problem Relation Age of Onset  . Stroke Mother        deceased after 3 strokes 1999-09-09  . Diabetes Father   . Heart attack Father   . Stroke Maternal Grandmother        deceased after 3 strokes  . Stroke Sister        twin sister  . Colon cancer Neg Hx     Social History Social History   Tobacco Use  . Smoking status: Never Smoker  .  Smokeless tobacco: Never Used  . Tobacco comment: Never smoked  Vaping Use  . Vaping Use: Never used  Substance Use Topics  . Alcohol use: Yes    Alcohol/week: 1.0 standard drink    Types: 1 Glasses of wine per week    Comment: occassional wine  . Drug use: No    Review of Systems  All other systems negative except as documented in the HPI. All pertinent positives and negatives as reviewed in the HPI. ____________________________________________  PHYSICAL EXAM:  VITAL SIGNS: ED Triage Vitals  Enc Vitals Group     BP 12/02/19 0033 (!) 147/67     Pulse Rate 12/02/19 0033 63     Resp 12/02/19 0033 (!) 23     Temp 12/02/19 0033 (!) 97.5 F (36.4 C)     Temp src --      SpO2 12/02/19 0033 100 %     Weight 12/02/19 0030 120 lb 2.4 oz (54.5 kg)     Height 12/02/19 0030 5' (1.524 m)    Constitutional: Alert and oriented. Well appearing and in no acute distress. Eyes: Conjunctivae are normal. PERRL. EOMI. Head: Atraumatic. Nose: No  congestion/rhinnorhea. Mouth/Throat: Mucous membranes are moist.  Oropharynx non-erythematous. Neck: No stridor.  No meningeal signs.   Cardiovascular: Normal rate, regular rhythm. Good peripheral circulation. Grossly normal heart sounds.   Respiratory: Normal respiratory effort.  No retractions. Lungs CTAB. Gastrointestinal: Soft and nontender. No distention.  Musculoskeletal: No lower extremity tenderness nor edema. No gross deformities of extremities. Neurologic:  Normal speech and language. No gross focal neurologic deficits are appreciated.  Skin:  Skin is warm, dry and intact. No rash noted. Large hematoma to left hip, normal distal pulse.  ____________________________________________   LABS (all labs ordered are listed, but only abnormal results are displayed)  Labs Reviewed  CBC WITH DIFFERENTIAL/PLATELET - Abnormal; Notable for the following components:      Result Value   WBC 15.0 (*)    Neutro Abs 11.0 (*)    Monocytes Absolute 1.4 (*)    Abs Immature Granulocytes 0.22 (*)    All other components within normal limits  COMPREHENSIVE METABOLIC PANEL - Abnormal; Notable for the following components:   Sodium 131 (*)    CO2 19 (*)    Glucose, Bld 184 (*)    BUN 24 (*)    GFR calc non Af Amer 55 (*)    All other components within normal limits  URINALYSIS, ROUTINE W REFLEX MICROSCOPIC - Abnormal; Notable for the following components:   Hgb urine dipstick SMALL (*)    Ketones, ur 5 (*)    Bacteria, UA RARE (*)    All other components within normal limits  CBG MONITORING, ED - Abnormal; Notable for the following components:   Glucose-Capillary 175 (*)    All other components within normal limits  URINE CULTURE  MAGNESIUM   ____________________________________________  EKG   EKG Interpretation  Date/Time:  Tuesday December 02 2019 00:31:27 EDT Ventricular Rate:  60 PR Interval:    QRS Duration: 89 QT Interval:  461 QTC Calculation: 461 R Axis:   73 Text  Interpretation: Sinus rhythm Minimal ST depression, inferior leads Baseline wander in lead(s) V3 no obvious change from previous Confirmed by Merrily Pew [phone]) on 12/02/2019 12:48:32 AM       ____________________________________________  RADIOLOGY  DG Lumbar Spine Complete  Result Date: 12/01/2019 CLINICAL DATA:  [place] off porch steps tonight with low back pain. EXAM: LUMBAR SPINE - COMPLETE  4+ VIEW COMPARISON:  None. FINDINGS: Slight curvature of the lumbar spine convex right. Mild spondylosis throughout the lumbar spine to include facet arthropathy over the lower lumbar spine. Mild depression of the superior endplate of L4 which may be acute or chronic. Mild disc space narrowing at the L3-4 level. Surgical clips over the pelvis and right upper quadrant. IMPRESSION: 1. Mild depression of the superior endplate of L4 which may be acute or chronic. 2. Mild spondylosis of the lumbar spine with mild disc disease at the L3-4 level. Electronically Signed   By: Marin Olp M.D.   On: 12/01/2019 21:09   CT Head Wo Contrast  Result Date: 12/01/2019 CLINICAL DATA:  Fall with right head injury. EXAM: CT HEAD WITHOUT CONTRAST TECHNIQUE: Contiguous axial images were obtained from the base of the skull through the vertex without intravenous contrast. COMPARISON:  11/03/2016 FINDINGS: Brain: Ventricles, cisterns and other CSF spaces are within normal. There is moderate chronic ischemic microvascular disease. There is no mass, mass effect, shift of midline structures or acute hemorrhage. No evidence of acute infarction. Vascular: No hyperdense vessel or unexpected calcification. Skull: Normal. Negative for fracture or focal lesion. Sinuses/Orbits: No acute finding. Other: None. IMPRESSION: 1.  No acute findings. 2.  Moderate chronic ischemic microvascular disease. Electronically Signed   By: Marin Olp M.D.   On: 12/01/2019 21:23   DG Hip Unilat W or Wo Pelvis 2-3 Views Left  Result Date: 12/01/2019 CLINICAL  DATA:  [place] from porch steps tonight.  Left hip pain. EXAM: DG HIP (WITH OR WITHOUT PELVIS) 2-3V LEFT COMPARISON:  Left hip 02/28/2016 and 01/21/2015 FINDINGS: Minimal symmetric degenerative change of the hips. 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 No acute fracture or dislocation. Small sclerotic focus over the left ischium unchanged. Mild degenerative change of the spine. Several surgical clips over the pelvis unchanged. IMPRESSION: 1.  No acute findings. 2.  Stable chronic changes over the left proximal femur and ischium. Electronically Signed   By: Marin Olp M.D.   On: 12/01/2019 21:13   ____________________________________________  PROCEDURES  Procedure(s) performed:   Procedures ____________________________________________  INITIAL IMPRESSION / ASSESSMENT AND PLAN / ED COURSE   This patient presents to the ED for concern of loss of consciousness, this involves an extensive number of treatment options, and is a complaint that carries with it a high risk of complications and morbidity.  The differential diagnosis includes syncope (hypovolemic, vasovagal, and less likely cardiac) versus seizure.     Lab Tests:   I Ordered, reviewed, and interpreted labs, which included urinalysis which did not show any infection, CBC which elevated white blood cell count but no anemia.  Think this is likely related to stress of current events and falls.  No other evidence of infection.  Her sodium slightly low but not likely symptomatic.  Her bicarb is slightly consistent with dehydration.  Medicines ordered:   I ordered medication fluids for dehydration  Imaging Studies ordered:   None were indicated at this time as were previously done and no new falls.  Low suspicion for a missed bleed at this point.  Additional history obtained:   Additional history obtained from daughter at  bedside  Previous records obtained and reviewed in epic  Consultations Obtained:   I consulted no one and discussed lab and imaging findings  Reevaluation:  After the interventions stated above, I reevaluated the patient and found still mildly orthostatic  but able to ambulate around the department without difficulty which is much further that she is ambulated earlier apparently.  She is symptom-free.  Her rhythm strip is negative.  Rest of work-up is reassuring.  We will continue to follow-up with her primary doctor and take some blood pressure measurements at home to make sure she does not get hypotensive or significantly hypertensive.  Critical Interventions:    A medical screening exam was performed and I feel the patient has had an appropriate workup for their chief complaint at this time and likelihood of emergent condition existing is low. They have been counseled on decision, discharge, follow up and which symptoms necessitate immediate return to the emergency department. They or their family verbally stated understanding and agreement with plan and discharged in stable condition.   ____________________________________________  FINAL CLINICAL IMPRESSION(S) / ED DIAGNOSES  Final diagnoses:  Syncope and collapse    MEDICATIONS GIVEN DURING THIS VISIT:  Medications  lactated ringers bolus 1,000 mL (0 mLs Intravenous Stopped 12/02/19 0401)  ondansetron (ZOFRAN) injection 4 mg (4 mg Intravenous Given 12/02/19 0138)    NEW OUTPATIENT MEDICATIONS STARTED DURING THIS VISIT:  Discharge Medication List as of 12/02/2019  5:07 AM      Note:  This note was prepared with assistance of Dragon voice recognition software. Occasional wrong-word or sound-a-like substitutions may have occurred due to the inherent limitations of voice recognition software.   Zaharah Amir, Corene Cornea, MD 12/02/19 [phone]

## 2019-12-02 NOTE — ED Notes (Signed)
Pt attempted to provide urine sample but was unable to do so.

## 2019-12-04 LAB — URINE CULTURE: Culture: <10000 — AB

## 2020-04-23 ENCOUNTER — Ambulatory Visit (INDEPENDENT_AMBULATORY_CARE_PROVIDER_SITE_OTHER): Payer: Medicare Other

## 2020-04-23 ENCOUNTER — Ambulatory Visit
Admission: EM | Admit: 2020-04-23 | Discharge: 2020-04-23 | Disposition: A | Discharge status: Home / Self Care | Payer: Medicare Other

## 2020-04-23 ENCOUNTER — Other Ambulatory Visit: Payer: Self-pay

## 2020-04-23 ENCOUNTER — Encounter: Payer: Self-pay | Admitting: Emergency Medicine

## 2020-04-23 DIAGNOSIS — R079 Chest pain, unspecified: Secondary | ICD-10-CM

## 2020-04-23 DIAGNOSIS — J209 Acute bronchitis, unspecified: Secondary | ICD-10-CM

## 2020-04-23 DIAGNOSIS — R059 Cough, unspecified: Secondary | ICD-10-CM | POA: Diagnosis not present

## 2020-04-23 MED ORDER — PREDNISONE 10 MG (21) PO TBPK: ORAL_TABLET | Freq: Every day | ORAL | 0 refills | Status: AC

## 2020-04-23 MED ORDER — PREDNISONE 10 MG (21) PO TBPK
ORAL_TABLET | Freq: Every day | ORAL | 0 refills | Status: DC
Start: 2020-04-23 — End: 2020-04-23

## 2020-04-23 NOTE — Discharge Instructions (Addendum)
I have sent in a prednisone taper for you to take for 6 days. 6 tablets on day one, 5 tablets on day two, 4 tablets on day three, 3 tablets on day four, 2 tablets on day five, and 1 tablet on day six.  Chest xray was negative for pneumonia today  Follow up with this office or with primary care if symptoms are persisting.  Follow up in the ER for high fever, trouble swallowing, trouble breathing, other concerning symptoms.

## 2020-04-23 NOTE — ED Provider Notes (Signed)
[place]    CSN: [id number] Arrival date & time: 04/23/20  1049      History   Chief Complaint Chief Complaint  Patient presents with  . Cough    HPI Angela Galvan is a 86 y.o. female.   Presents today from [employer] living with daughter.  Reports that she has been having a cough for the last week.  Reports fatigue and runny nose as well.  Reports that she is concerned that it is down in her chest.  Denies coughing up anything.  Has negative history of Covid.  Has had Covid vaccines.  Reports that she has been taking Tessalon Perles that were given to her by her PCP.  Denies headache, nausea, vomiting, diarrhea, rash, fever, other symptoms.  ROS per HPI  The history is provided by the patient.  Cough   Past Medical History:  Diagnosis Date  . Anxiety   . Arthritis   . Asthma   . Cancer [facility])    breast cancer left with mastectomy  . Cataract   . 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 Depression   . Diabetes mellitus    no meds.-diet controlled  . Diarrhea   . Diverticulitis    s/p colectomy in 1991  . GERD (gastroesophageal reflux disease)   . HOH (hard of hearing)   . HTN (hypertension)    Does not see a cardiologist  . Hyperlipidemia   . Hypothyroidism   . IBS (irritable bowel syndrome)   . S/P colonoscopy 2003   minimal internal hemorrhoids, pancolonic diverticula, biopsy of rectum: lymphoplasmacytic microscopic colitis, colon biopsies normal  . S/P colonoscopy 2012   pancolonic diverticula, nl TI, external hemorrhoidal tag,/rectum bx no abnormalities  . S/P endoscopy 2012   normal esophagus, stenotic pyloric channel, TTS dilation  . Stroke ([place]) 08/10/2006   no residual    Patient Active Problem List   Diagnosis Date Noted  . Hypothyroidism 03/11/2019  . Hyperlipidemia 03/11/2019  . Menopause 03/11/2019  . Encounter for screening for  malignant neoplasm of breast 03/11/2019  . Need for immunization against influenza 03/11/2019  . Fecal incontinence 12/10/2018  . Hoarseness 11/14/2018  . Chronic cough   . Syncope and collapse 11/03/2016  . Type 2 diabetes mellitus without complication ([place])   . Breast mass, right 01/29/2015  . IBS (irritable bowel syndrome) 10/29/2014  . 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/07/2013  . 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 Psychophysiologic insomnia 06/20/2012  . 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 Stiffness of joint, not elsewhere classified, lower leg 10/24/2011  . Difficulty in walking(719.7) 10/24/2011  . Pain in joint, lower leg 10/24/2011  . OA (osteoarthritis) of knee 09/25/2011  . GERD (gastroesophageal reflux disease) 09/15/2011  . Epigastric pain 09/29/2010  . Diarrhea 09/29/2010    Past Surgical History:  Procedure Laterality Date  . ABDOMINAL HYSTERECTOMY  1983  . ANAL RECTAL MANOMETRY N/A 01/06/2019   Procedure: ANO RECTAL MANOMETRY;  Surgeon: Mauri Pole, MD;  Location: [facility];  Service: Endoscopy;  Laterality: N/A;  . bilateral cataracts    . BIOPSY  03/23/2016   Procedure: BIOPSY;  Surgeon: Daneil Dolin, MD;  [facility];  Service: Endoscopy;;  colon   . BREAST BIOPSY Left 01/10/2013   Procedure: BREAST BIOPSY;  Surgeon: Jamesetta So, MD;  Location:  [facility];  Service: General;  Laterality: Left;  . BREAST LUMPECTOMY Right 01/29/2015   Procedure: RIGHT BREAST LUMPECTOMY;  Surgeon: Fanny Skates, MD;  Location: [facility];  Service: General;  Laterality: Right;  . CHOLECYSTECTOMY    . COLECTOMY    . COLONOSCOPY  09/11/01   Minimal internal hemorrhoids, otherwise normal rectum . Scattered pancolonic diverticula (few) The colonic mucosa otherwise appeared normal status post biopsies and stool sampling as described above.  . COLONOSCOPY N/A 03/23/2016   left colonic diverticulosis, otherwise  normal.   . ESOPHAGEAL MANOMETRY N/A 05/13/2018   Procedure: ESOPHAGEAL MANOMETRY (EM);  Surgeon: Mauri Pole, MD;  [facility]: [facility];  Service: Endoscopy;  Laterality: N/A;  Dr Julious Payer  . ESOPHAGOGASTRODUODENOSCOPY  10/11/2010   Normal esophagus, small hiatal hernia,Stenotic pyloric channel likely critical.  Likely contributing to some of the patient's symptoms, status post TTS balloon dilation Normal D1-D3 status post biopsy.  D2-D3 atrophic-appearing gastric mucosa status post biopsy  . ileocolonoscopy  10/11/2010   external hemorrhoidal tag.  Normal rectum status post biopsy Pancolonic diverticula status post segmental biopsy.  Normal  terminal ileum  . JOINT REPLACEMENT Bilateral   . KNEE ARTHROPLASTY Right 2004  . KNEE SURGERY Bilateral    2004- [facility]- total; knee  . [place] IMPEDANCE STUDY N/A 05/13/2018   Procedure: [place] IMPEDANCE STUDY;  Surgeon: Mauri Pole, MD;  [facility];  Service: Endoscopy;  Laterality: N/A;  On PPI  . right ovarian tumor removed as teenager    . SIMPLE MASTECTOMY WITH AXILLARY SENTINEL NODE BIOPSY Left 03/07/2013   Procedure: TOTAL  MASTECTOMY WITH AXILLARY SENTINEL NODE BIOPSY;  Surgeon: Adin Hector, MD;  Location: [facility];  Service: General;  Laterality: Left;  . TOTAL KNEE ARTHROPLASTY  09/25/2011   Procedure: TOTAL KNEE ARTHROPLASTY;  Surgeon: Gearlean Alf, MD;  [facility]: [facility];  Service: Orthopedics;  Laterality: Left;    OB History   No obstetric history on file.      Home Medications    Prior to Admission medications   Medication Sig Start Date End Date Taking? Authorizing Provider  albuterol (VENTOLIN HFA) 108 (90 Base) MCG/ACT inhaler INHALE 2 PUFFS EVERY 6 HOURS AS NEEDED FOR WHEEZING OR SHORTNESS OF BREATH Patient taking differently: Inhale 2 puffs into the lungs every 6 (six) hours as needed for wheezing or shortness of breath.  07/29/19  Yes Margaretha Seeds, MD  aspirin EC 81 MG tablet Take 81 mg by mouth  daily. Swallow whole.   Yes [provider]  atorvastatin (LIPITOR) 10 MG tablet Take 1 tablet (10 mg total) by mouth daily after breakfast. 05/05/19  Yes Corum, Rex Kras, MD  BREO ELLIPTA 200-25 MCG/INH AEPB Inhale 1 puff into the lungs daily. 10/28/19  Yes [provider]  fluticasone (FLONASE) 50 MCG/ACT nasal spray Place 1 spray into both nostrils daily. Patient taking differently: Place 1 spray into both nostrils daily as needed for allergies or rhinitis.  08/25/19  Yes Margaretha Seeds, MD  levothyroxine (SYNTHROID) 88 MCG tablet Take 88 mcg by mouth daily before breakfast.   Yes [provider]  loperamide (IMODIUM) 2 MG capsule Take 2 mg by mouth as needed for diarrhea or loose stools.   Yes [provider]  loratadine (CLARITIN) 10 MG tablet Take 10 mg by mouth daily.   Yes [provider]  losartan (COZAAR) 100 MG tablet Take 1 tablet (100 mg total) by mouth daily. 05/05/19  Yes Corum,  Rex Kras, MD  RABEprazole (ACIPHEX) 20 MG tablet TAKE 1 TABLET BY MOUTH 2 TIMES DAILY Patient taking differently: Take 20 mg by mouth in the morning and at bedtime.  12/26/18  Yes Mahala Menghini, PA-C  RESTASIS 0.05 % ophthalmic emulsion Place 1 drop into both eyes 2 (two) times daily.  08/21/19  Yes [provider]  sertraline (ZOLOFT) 100 MG tablet Take 100 mg by mouth daily.   Yes [provider]  acetaminophen (TYLENOL) 500 MG tablet Take 500-1,000 mg by mouth every 6 (six) hours as needed for mild pain or moderate pain. Pain     [provider]  ALPRAZolam (XANAX) 0.5 MG tablet Take 0.5 mg by mouth 2 (two) times daily as needed for sleep or anxiety.    [provider]  aspirin 325 MG tablet Take 325 mg by mouth daily.    [provider]  clidinium-chlordiazePOXIDE (LIBRAX) 5-2.5 MG capsule Use once daily in the morning. Further doses as needed up to 4 a day Patient taking differently: Take 1 capsule by mouth daily as  needed (constipation).  02/11/19   Carlis Stable, NP  FLUoxetine (PROZAC) 20 MG capsule TAKE ONE CAPSULE BY MOUTH ONCE DAILY. Patient taking differently: Take 20 mg by mouth every morning.  10/28/19   Corum, Rex Kras, MD  fluticasone furoate-vilanterol (BREO ELLIPTA) 100-25 MCG/INH AEPB INHALE 1 PUFF INTO THE LUNGS DAILY. Patient not taking: Reported on 12/01/2019 08/25/19   Margaretha Seeds, MD  gabapentin (NEURONTIN) 300 MG capsule TAKE 1 CAPSULE BY MOUTH 3 TIMES A DAY AS NEEDED Patient taking differently: Take 300 mg by mouth at bedtime.  06/02/19   Margaretha Seeds, MD  levothyroxine (SYNTHROID) 75 MCG tablet Take 1 tablet (75 mcg total) by mouth daily. Patient taking differently: Take 75 mcg by mouth daily before breakfast.  07/11/19   Corum, Rex Kras, MD  predniSONE (STERAPRED UNI-PAK 21 TAB) 10 MG (21) TBPK tablet Take by mouth daily for 6 days. Take 6 tablets on day 1, 5 tablets on day 2, 4 tablets on day 3, 3 tablets on day 4, 2 tablets on day 5, 1 tablet on day 6 04/23/20 04/29/20  Faustino Congress, NP    Family History Family History  Problem Relation Age of Onset  . Stroke Mother        deceased after 3 strokes 08/21/99  . Diabetes Father   . Heart attack Father   . Stroke Maternal Grandmother        deceased after 3 strokes  . Stroke Sister        twin sister  . Colon cancer Neg Hx     Social History Social History   Tobacco Use  . Smoking status: Never Smoker  . Smokeless tobacco: Never Used  . Tobacco comment: Never smoked  Vaping Use  . Vaping Use: Never used  Substance Use Topics  . Alcohol use: Yes    Alcohol/week: 1.0 standard drink    Types: 1 Glasses of wine per week    Comment: occassional wine  . Drug use: No     Allergies   Patient has no known allergies.   Review of Systems Review of Systems  Respiratory: Positive for cough.      Physical Exam Triage Vital Signs ED Triage Vitals [04/23/20 1100]  Enc Vitals Group     BP (!) 134/56     Pulse Rate 87      Resp 17  Temp 98.4 F (36.9 C)     Temp Source Oral     SpO2 94 %     Weight 119 lb (54 kg)     Height 5' (1.524 m)     Head Circumference      Peak Flow      Pain Score 0     Pain Loc      Pain Edu?      Excl. in [place]?    No data found.  Updated Vital Signs BP (!) 134/56 (BP Location: Right Arm)   Pulse 87   Temp 98.4 F (36.9 C) (Oral)   Resp 17   Ht 5' (1.524 m)   Wt 119 lb (54 kg)   SpO2 94%   BMI 23.24 kg/m       Physical Exam Vitals and nursing note reviewed.  Constitutional:      General: She is not in acute distress.    Appearance: Normal appearance. She is well-developed.  HENT:     Head: Normocephalic and atraumatic.     Right Ear: Tympanic membrane, ear canal and external ear normal.     Left Ear: Tympanic membrane, ear canal and external ear normal.     Nose: Nose normal.     Mouth/Throat:     Mouth: Mucous membranes are moist.     Pharynx: Oropharynx is clear.  Eyes:     Extraocular Movements: Extraocular movements intact.     Conjunctiva/sclera: Conjunctivae normal.     Pupils: Pupils are equal, round, and reactive to light.  Cardiovascular:     Rate and Rhythm: Normal rate and regular rhythm.     Heart sounds: Normal heart sounds. No murmur heard.   Pulmonary:     Effort: Pulmonary effort is normal. No respiratory distress.     Breath sounds: No stridor. Wheezing and rhonchi present. No rales.  Chest:     Chest wall: No tenderness.  Abdominal:     General: Bowel sounds are normal.     Palpations: Abdomen is soft.     Tenderness: There is no abdominal tenderness.  Musculoskeletal:        General: Normal range of motion.     Cervical back: Neck supple.  Skin:    General: Skin is warm and dry.     Capillary Refill: Capillary refill takes less than 2 seconds.  Neurological:     General: No focal deficit present.     Mental Status: She is alert and oriented to person, place, and time.  Psychiatric:        Mood and Affect: Mood  normal.        Behavior: Behavior normal.        Thought Content: Thought content normal.      UC Treatments / Results  Labs (all labs ordered are listed, but only abnormal results are displayed) Labs Reviewed - No data to display  EKG   Radiology No results found.  Procedures Procedures (including critical care time)  Medications Ordered in UC Medications - No data to display  Initial Impression / Assessment and Plan / UC Course  I have reviewed the triage vital signs and the nursing notes.  Pertinent labs & imaging results that were available during my care of the patient were reviewed by me and considered in my medical decision making (see chart for details).    Cough Acute bronchitis  X-rays negative today for pneumonia Diffuse wheezing noted bilaterally throughout lung fields, rhonchi in lower  lobes that clears somewhat with coughing Prescribe steroid taper Provided written order for facility as well Continue home medication regimen Had negative Covid test at facility, did not wish to repeat today Follow-up with this office or with primary care if symptoms are persisting Follow-up with the ER for trouble swallowing, trouble breathing, other concerning symptoms  Final Clinical Impressions(s) / UC Diagnoses   Final diagnoses:  Cough  Acute bronchitis, unspecified organism     Discharge Instructions     I have sent in a prednisone taper for you to take for 6 days. 6 tablets on day one, 5 tablets on day two, 4 tablets on day three, 3 tablets on day four, 2 tablets on day five, and 1 tablet on day six.  Chest xray was negative for pneumonia today  Follow up with this office or with primary care if symptoms are persisting.  Follow up in the ER for high fever, trouble swallowing, trouble breathing, other concerning symptoms.       ED Prescriptions    Medication Sig Dispense Auth. Provider   predniSONE (STERAPRED UNI-PAK 21 TAB) 10 MG (21) TBPK tablet   (Status: Discontinued) Take by mouth daily for 6 days. Take 6 tablets on day 1, 5 tablets on day 2, 4 tablets on day 3, 3 tablets on day 4, 2 tablets on day 5, 1 tablet on day 6 21 tablet Faustino Congress, NP   predniSONE (STERAPRED UNI-PAK 21 TAB) 10 MG (21) TBPK tablet Take by mouth daily for 6 days. Take 6 tablets on day 1, 5 tablets on day 2, 4 tablets on day 3, 3 tablets on day 4, 2 tablets on day 5, 1 tablet on day 6 21 tablet Faustino Congress, NP     PDMP not reviewed this encounter.   Faustino Congress, NP 04/26/20 1233

## 2020-04-23 NOTE — ED Triage Notes (Signed)
Cough that started on Tuesday and continues to get worse.  Pt states its in her chest and she is afraid it will go into pne.  Given tessalon pearls by pcp with no relief.  Pt from assisted living and tested covid neg on Wednesday of this week.

## 2020-08-25 ENCOUNTER — Emergency Department (HOSPITAL_COMMUNITY): Payer: Medicare Other

## 2020-08-25 ENCOUNTER — Other Ambulatory Visit: Payer: Self-pay

## 2020-08-25 ENCOUNTER — Emergency Department (HOSPITAL_COMMUNITY)
Admission: EM | Admit: 2020-08-25 | Discharge: 2020-08-25 | Disposition: A | Discharge status: Home / Self Care | Payer: Medicare Other | Attending: Emergency Medicine | Admitting: Emergency Medicine

## 2020-08-25 ENCOUNTER — Encounter (HOSPITAL_COMMUNITY): Payer: Self-pay | Admitting: Emergency Medicine

## 2020-08-25 DIAGNOSIS — Z79899 Other long term (current) drug therapy: Secondary | ICD-10-CM | POA: Diagnosis not present

## 2020-08-25 DIAGNOSIS — Z96653 Presence of artificial knee joint, bilateral: Secondary | ICD-10-CM | POA: Insufficient documentation

## 2020-08-25 DIAGNOSIS — Z7982 Long term (current) use of aspirin: Secondary | ICD-10-CM | POA: Diagnosis not present

## 2020-08-25 DIAGNOSIS — I10 Essential (primary) hypertension: Secondary | ICD-10-CM | POA: Diagnosis not present

## 2020-08-25 DIAGNOSIS — S0003XA Contusion of scalp, initial encounter: Secondary | ICD-10-CM

## 2020-08-25 DIAGNOSIS — W01198A Fall on same level from slipping, tripping and stumbling with subsequent striking against other object, initial encounter: Secondary | ICD-10-CM | POA: Insufficient documentation

## 2020-08-25 DIAGNOSIS — E039 Hypothyroidism, unspecified: Secondary | ICD-10-CM | POA: Diagnosis not present

## 2020-08-25 DIAGNOSIS — J45909 Unspecified asthma, uncomplicated: Secondary | ICD-10-CM | POA: Diagnosis not present

## 2020-08-25 DIAGNOSIS — E119 Type 2 diabetes mellitus without complications: Secondary | ICD-10-CM | POA: Diagnosis not present

## 2020-08-25 DIAGNOSIS — S0990XA Unspecified injury of head, initial encounter: Secondary | ICD-10-CM | POA: Diagnosis present

## 2020-08-25 DIAGNOSIS — E871 Hypo-osmolality and hyponatremia: Secondary | ICD-10-CM | POA: Diagnosis not present

## 2020-08-25 DIAGNOSIS — Z853 Personal history of malignant neoplasm of breast: Secondary | ICD-10-CM | POA: Insufficient documentation

## 2020-08-25 DIAGNOSIS — Z7952 Long term (current) use of systemic steroids: Secondary | ICD-10-CM | POA: Diagnosis not present

## 2020-08-25 LAB — BASIC METABOLIC PANEL
Anion gap: 12 (ref 5–15)
BUN: 23 mg/dL (ref 8–23)
CO2: 19 mmol/L — ABNORMAL LOW (ref 22–32)
Calcium: 9.2 mg/dL (ref 8.9–10.3)
Chloride: 96 mmol/L — ABNORMAL LOW (ref 98–111)
Creatinine, Ser: 1.14 mg/dL — ABNORMAL HIGH (ref 0.44–1.00)
GFR, Estimated: 47 mL/min — ABNORMAL LOW (ref >60)
Glucose, Bld: 120 mg/dL — ABNORMAL HIGH (ref 70–99)
Potassium: 4.6 mmol/L (ref 3.5–5.1)
Sodium: 127 mmol/L — ABNORMAL LOW (ref 135–145)

## 2020-08-25 LAB — CBC
HCT: 38.7 % (ref 36.0–46.0)
Hemoglobin: 13.2 g/dL (ref 12.0–15.0)
MCH: 30.6 pg (ref 26.0–34.0)
MCHC: 34.1 g/dL (ref 30.0–36.0)
MCV: 89.8 fL (ref 80.0–100.0)
Platelets: 330 10*3/uL (ref 150–400)
RBC: 4.31 MIL/uL (ref 3.87–5.11)
RDW: 13.2 % (ref 11.5–15.5)
WBC: 9.6 10*3/uL (ref 4.0–10.5)
nRBC: 0 % (ref 0.0–0.2)

## 2020-08-25 NOTE — Discharge Instructions (Addendum)
Take Tylenol as needed for pain.  Apply ice to help with the swelling follow-up with your doctor to check on your sodium levels

## 2020-08-25 NOTE — ED Notes (Signed)
Pt returned from CT °

## 2020-08-25 NOTE — ED Provider Notes (Signed)
[facility] EMERGENCY DEPARTMENT Provider Note   CSN: [id number] Arrival date & time: 08/25/20  1025     History Chief Complaint  Patient presents with  . Fall    Angela Galvan is a 87 y.o. female.  HPI   Patient presents to the ED for evaluation after a fall.  Patient states he has a history of having problems with her balance.  She has seen a neurologist for this in the past and they are not sure what specifically causes it.  Patient states she is not sure what happened that caused her to fall today.  She did strike the back of her head and does have pain and tenderness on the back of her head.  She denies any loss of consciousness.  She denies any chest pain or shortness of breath.  No vomiting or diarrhea.  She has not been feeling sick or ill.  She denies any pain in her extremities.  Past Medical History:  Diagnosis Date  . Anxiety   . Arthritis   . Asthma   . Cancer [facility])    breast cancer left with mastectomy  . Cataract   . 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 Depression   . Diabetes mellitus    no meds.-diet controlled  . Diarrhea   . Diverticulitis    s/p colectomy in 1991  . GERD (gastroesophageal reflux disease)   . HOH (hard of hearing)   . HTN (hypertension)    Does not see a cardiologist  . Hyperlipidemia   . Hypothyroidism   . IBS (irritable bowel syndrome)   . S/P colonoscopy 2003   minimal internal hemorrhoids, pancolonic diverticula, biopsy of rectum: lymphoplasmacytic microscopic colitis, colon biopsies normal  . S/P colonoscopy 2012   pancolonic diverticula, nl TI, external hemorrhoidal tag,/rectum bx no abnormalities  . S/P endoscopy 2012   normal esophagus, stenotic pyloric channel, TTS dilation  . Stroke ([place]) 08/10/2006   no residual    Patient Active Problem List   Diagnosis Date Noted  . Hypothyroidism 03/11/2019  . Hyperlipidemia 03/11/2019  .  Menopause 03/11/2019  . Encounter for screening for malignant neoplasm of breast 03/11/2019  . Need for immunization against influenza 03/11/2019  . Fecal incontinence 12/10/2018  . Hoarseness 11/14/2018  . Chronic cough   . Syncope and collapse 11/03/2016  . Type 2 diabetes mellitus without complication ([place])   . Breast mass, right 01/29/2015  . IBS (irritable bowel syndrome) 10/29/2014  . 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/07/2013  . Paget's disease of left breast ([place]) 01/24/2013  . Psychophysiologic insomnia 06/20/2012  . 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 Stiffness of joint, not elsewhere classified, lower leg 10/24/2011  . Difficulty in walking(719.7) 10/24/2011  . Pain in joint, lower leg 10/24/2011  . OA (osteoarthritis) of knee 09/25/2011  . GERD (gastroesophageal reflux disease) 09/15/2011  . Epigastric pain 09/29/2010  . Diarrhea 09/29/2010    Past Surgical History:  Procedure Laterality Date  . ABDOMINAL HYSTERECTOMY  1983  . ANAL RECTAL MANOMETRY N/A 01/06/2019   Procedure: ANO RECTAL MANOMETRY;  Surgeon: Mauri Pole, MD;  Location: [facility];  Service: Endoscopy;  Laterality: N/A;  . bilateral cataracts    . BIOPSY  03/23/2016   Procedure: BIOPSY;  Surgeon: Daneil Dolin, MD;  Location: [facility];  Service: Endoscopy;;  colon   . BREAST  BIOPSY Left 01/10/2013   Procedure: BREAST BIOPSY;  Surgeon: Jamesetta So, MD;  Location: [facility];  Service: General;  Laterality: Left;  . BREAST LUMPECTOMY Right 01/29/2015   Procedure: RIGHT BREAST LUMPECTOMY;  Surgeon: Fanny Skates, MD;  Location: [place];  Service: General;  Laterality: Right;  . CHOLECYSTECTOMY    . COLECTOMY    . COLONOSCOPY  09/11/01   Minimal internal hemorrhoids, otherwise normal rectum . Scattered pancolonic diverticula (few) The colonic mucosa otherwise appeared normal status post biopsies and stool sampling as described above.  . COLONOSCOPY N/A  03/23/2016   left colonic diverticulosis, otherwise normal.   . ESOPHAGEAL MANOMETRY N/A 05/13/2018   Procedure: ESOPHAGEAL MANOMETRY (EM);  Surgeon: Mauri Pole, MD;  Location: [facility];  Service: Endoscopy;  Laterality: N/A;  Dr Julious Payer  . ESOPHAGOGASTRODUODENOSCOPY  10/11/2010   Normal esophagus, small hiatal hernia,Stenotic pyloric channel likely critical.  Likely contributing to some of the patient's symptoms, status post TTS balloon dilation Normal D1-D3 status post biopsy.  D2-D3 atrophic-appearing gastric mucosa status post biopsy  . ileocolonoscopy  10/11/2010   external hemorrhoidal tag.  Normal rectum status post biopsy Pancolonic diverticula status post segmental biopsy.  Normal  terminal ileum  . JOINT REPLACEMENT Bilateral   . KNEE ARTHROPLASTY Right Aug 21, 2002  . KNEE SURGERY Bilateral    08/21/2002- [facility]- total; knee  . [place] IMPEDANCE STUDY N/A 05/13/2018   Procedure: [place] IMPEDANCE STUDY;  Surgeon: Mauri Pole, MD;  Location: [facility];  Service: Endoscopy;  Laterality: N/A;  On PPI  . right ovarian tumor removed as teenager    . SIMPLE MASTECTOMY WITH AXILLARY SENTINEL NODE BIOPSY Left 03/07/2013   Procedure: TOTAL  MASTECTOMY WITH AXILLARY SENTINEL NODE BIOPSY;  Surgeon: Adin Hector, MD;  Location: [place];  Service: General;  Laterality: Left;  . TOTAL KNEE ARTHROPLASTY  09/25/2011   Procedure: TOTAL KNEE ARTHROPLASTY;  Surgeon: Gearlean Alf, MD;  Location: [facility];  Service: Orthopedics;  Laterality: Left;     OB History   No obstetric history on file.     Family History  Problem Relation Age of Onset  . Stroke Mother        deceased after 3 strokes 1999/08/21  . Diabetes Father   . Heart attack Father   . Stroke Maternal Grandmother        deceased after 3 strokes  . Stroke Sister        twin sister  . Colon cancer Neg Hx     Social History   Tobacco Use  . Smoking status: Never Smoker  . Smokeless tobacco: Never Used  . Tobacco comment: Never  smoked  Vaping Use  . Vaping Use: Never used  Substance Use Topics  . Alcohol use: Yes    Alcohol/week: 1.0 standard drink    Types: 1 Glasses of wine per week    Comment: occassional wine  . Drug use: No    Home Medications Prior to Admission medications   Medication Sig Start Date End Date Taking? Authorizing Provider  acetaminophen (TYLENOL) 500 MG tablet Take 500-1,000 mg by mouth every 6 (six) hours as needed for mild pain or moderate pain. Pain     [provider]  albuterol (VENTOLIN HFA) 108 (90 Base) MCG/ACT inhaler INHALE 2 PUFFS EVERY 6 HOURS AS NEEDED FOR WHEEZING OR SHORTNESS OF BREATH Patient taking differently: Inhale 2 puffs into the lungs every 6 (six) hours as needed for wheezing or shortness of  breath.  07/29/19   Margaretha Seeds, MD  ALPRAZolam Duanne Moron) 0.5 MG tablet Take 0.5 mg by mouth 2 (two) times daily as needed for sleep or anxiety.    [provider]  aspirin 325 MG tablet Take 325 mg by mouth daily.    [provider]  aspirin EC 81 MG tablet Take 81 mg by mouth daily. Swallow whole.    [provider]  atorvastatin (LIPITOR) 10 MG tablet Take 1 tablet (10 mg total) by mouth daily after breakfast. 05/05/19   Corum, Rex Kras, MD  BREO ELLIPTA 200-25 MCG/INH AEPB Inhale 1 puff into the lungs daily. 10/28/19   [provider]  clidinium-chlordiazePOXIDE (LIBRAX) 5-2.5 MG capsule Use once daily in the morning. Further doses as needed up to 4 a day Patient taking differently: Take 1 capsule by mouth daily as needed (constipation).  02/11/19   Carlis Stable, NP  FLUoxetine (PROZAC) 20 MG capsule TAKE ONE CAPSULE BY MOUTH ONCE DAILY. Patient taking differently: Take 20 mg by mouth every morning.  10/28/19   Corum, Rex Kras, MD  fluticasone (FLONASE) 50 MCG/ACT nasal spray Place 1 spray into both nostrils daily. Patient taking differently: Place 1 spray into both nostrils daily as needed for allergies or rhinitis.  08/25/19   Margaretha Seeds, MD  fluticasone furoate-vilanterol (BREO ELLIPTA) 100-25 MCG/INH AEPB INHALE 1 PUFF INTO THE LUNGS DAILY. Patient not taking: Reported on 12/01/2019 08/25/19   Margaretha Seeds, MD  gabapentin (NEURONTIN) 300 MG capsule TAKE 1 CAPSULE BY MOUTH 3 TIMES A DAY AS NEEDED Patient taking differently: Take 300 mg by mouth at bedtime.  06/02/19   Margaretha Seeds, MD  levothyroxine (SYNTHROID) 75 MCG tablet Take 1 tablet (75 mcg total) by mouth daily. Patient taking differently: Take 75 mcg by mouth daily before breakfast.  07/11/19   Corum, Rex Kras, MD  levothyroxine (SYNTHROID) 88 MCG tablet Take 88 mcg by mouth daily before breakfast.    [provider]  loperamide (IMODIUM) 2 MG capsule Take 2 mg by mouth as needed for diarrhea or loose stools.    [provider]  loratadine (CLARITIN) 10 MG tablet Take 10 mg by mouth daily.    [provider]  losartan (COZAAR) 100 MG tablet Take 1 tablet (100 mg total) by mouth daily. 05/05/19   Corum, Rex Kras, MD  RABEprazole (ACIPHEX) 20 MG tablet TAKE 1 TABLET BY MOUTH 2 TIMES DAILY Patient taking differently: Take 20 mg by mouth in the morning and at bedtime.  12/26/18   Mahala Menghini, PA-C  RESTASIS 0.05 % ophthalmic emulsion Place 1 drop into both eyes 2 (two) times daily.  08/21/19   [provider]  sertraline (ZOLOFT) 100 MG tablet Take 100 mg by mouth daily.    [provider]    Allergies    Patient has no known allergies.  Review of Systems   Review of Systems  All other systems reviewed and are negative.   Physical Exam Updated Vital Signs BP (!) 173/84 (BP Location: Right Arm)   Pulse 71   Temp 98.1 F (36.7 C) (Oral)   Resp 17   Ht 1.524 m (5')   Wt 56.2 kg   SpO2 98%   BMI 24.22 kg/m   Physical Exam Vitals and nursing note reviewed.  Constitutional:      General: She is not in acute distress.    Appearance: She is well-developed.  HENT:  Head: Normocephalic and atraumatic.      Right Ear: External ear normal.     Left Ear: External ear normal.  Eyes:     General: No scleral icterus.       Right eye: No discharge.        Left eye: No discharge.     Conjunctiva/sclera: Conjunctivae normal.  Neck:     Trachea: No tracheal deviation.  Cardiovascular:     Rate and Rhythm: Normal rate and regular rhythm.  Pulmonary:     Effort: Pulmonary effort is normal. No respiratory distress.     Breath sounds: Normal breath sounds. No stridor. No wheezing or rales.  Abdominal:     General: Bowel sounds are normal. There is no distension.     Palpations: Abdomen is soft.     Tenderness: There is no abdominal tenderness. There is no guarding or rebound.  Musculoskeletal:        General: No tenderness.     Cervical back: Neck supple.  Skin:    General: Skin is warm and dry.     Findings: No rash.  Neurological:     Mental Status: She is alert.     Cranial Nerves: No cranial nerve deficit (no facial droop, extraocular movements intact, no slurred speech).     Sensory: No sensory deficit.     Motor: No abnormal muscle tone or seizure activity.     Coordination: Coordination normal.     ED Results / Procedures / Treatments   Labs (all labs ordered are listed, but only abnormal results are displayed) Labs Reviewed  BASIC METABOLIC PANEL - Abnormal; Notable for the following components:      Result Value   Sodium 127 (*)    Chloride 96 (*)    CO2 19 (*)    Glucose, Bld 120 (*)    Creatinine, Ser 1.14 (*)    GFR, Estimated 47 (*)    All other components within normal limits  CBC    EKG EKG Interpretation  Date/Time:  Wednesday August 25 2020 19:25:07 EDT Ventricular Rate:  80 PR Interval:  152 QRS Duration: 90 QT Interval:  407 QTC Calculation: 470 R Axis:   35 Text Interpretation: Sinus rhythm No significant change since last tracing Confirmed by Dorie Rank [phone]) on 08/25/2020 7:38:07 PM   Radiology CT Head Wo Contrast  Result Date:  08/25/2020 CLINICAL DATA:  Fall EXAM: CT HEAD WITHOUT CONTRAST CT CERVICAL SPINE WITHOUT CONTRAST TECHNIQUE: Multidetector CT imaging of the head and cervical spine was performed following the standard protocol without intravenous contrast. Multiplanar CT image reconstructions of the cervical spine were also generated. COMPARISON:  12/01/2019 FINDINGS: CT HEAD FINDINGS Brain: There is no mass, hemorrhage or extra-axial collection. There is generalized atrophy without lobar predilection. There is hypoattenuation of the periventricular white matter, most commonly indicating chronic ischemic microangiopathy. Vascular: No abnormal hyperdensity of the major intracranial arteries or dural venous sinuses. No intracranial atherosclerosis. Skull: Left vertex hematoma.  No skull fracture. Sinuses/Orbits: No fluid levels or advanced mucosal thickening of the visualized paranasal sinuses. No mastoid or middle ear effusion. The orbits are normal. CT CERVICAL SPINE FINDINGS Alignment: No static subluxation. Facets are aligned. Occipital condyles are normally positioned. Skull base and vertebrae: No acute fracture. Soft tissues and spinal canal: No prevertebral fluid or swelling. No visible canal hematoma. Disc levels: No advanced spinal canal or neural foraminal stenosis. Upper chest: No pneumothorax, pulmonary nodule or pleural effusion. Other: Normal visualized paraspinal cervical soft  tissues. IMPRESSION: 1. Chronic ischemic microangiopathy and generalized atrophy without acute intracranial abnormality. 2. Left vertex hematoma without skull fracture. 3. No acute fracture or static subluxation of the cervical spine. Electronically Signed   By: Ulyses Jarred M.D.   On: 08/25/2020 20:48   CT Cervical Spine Wo Contrast  Result Date: 08/25/2020 CLINICAL DATA:  Fall EXAM: CT HEAD WITHOUT CONTRAST CT CERVICAL SPINE WITHOUT CONTRAST TECHNIQUE: Multidetector CT imaging of the head and cervical spine was performed following the  standard protocol without intravenous contrast. Multiplanar CT image reconstructions of the cervical spine were also generated. COMPARISON:  12/01/2019 FINDINGS: CT HEAD FINDINGS Brain: There is no mass, hemorrhage or extra-axial collection. There is generalized atrophy without lobar predilection. There is hypoattenuation of the periventricular white matter, most commonly indicating chronic ischemic microangiopathy. Vascular: No abnormal hyperdensity of the major intracranial arteries or dural venous sinuses. No intracranial atherosclerosis. Skull: Left vertex hematoma.  No skull fracture. Sinuses/Orbits: No fluid levels or advanced mucosal thickening of the visualized paranasal sinuses. No mastoid or middle ear effusion. The orbits are normal. CT CERVICAL SPINE FINDINGS Alignment: No static subluxation. Facets are aligned. Occipital condyles are normally positioned. Skull base and vertebrae: No acute fracture. Soft tissues and spinal canal: No prevertebral fluid or swelling. No visible canal hematoma. Disc levels: No advanced spinal canal or neural foraminal stenosis. Upper chest: No pneumothorax, pulmonary nodule or pleural effusion. Other: Normal visualized paraspinal cervical soft tissues. IMPRESSION: 1. Chronic ischemic microangiopathy and generalized atrophy without acute intracranial abnormality. 2. Left vertex hematoma without skull fracture. 3. No acute fracture or static subluxation of the cervical spine. Electronically Signed   By: Ulyses Jarred M.D.   On: 08/25/2020 20:48    Procedures Procedures   Medications Ordered in ED Medications - No data to display  ED Course  I have reviewed the triage vital signs and the nursing notes.  Pertinent labs & imaging results that were available during my care of the patient were reviewed by me and considered in my medical decision making (see chart for details).  Clinical Course as of 08/25/20 2203  Wed Aug 25, 2020  2134 Hyponatremia noted on  laboratory tests.  Slightly decreased from previous values where it was in the low 130s. [JK]  2134 CT and C-spine CT notable for scalp hematoma but no other acute finding [JK]    Clinical Course User Index [JK] Dorie Rank, MD   MDM Rules/Calculators/A&P                          Patient presented to the ED for evaluation of a fall.  Patient did not lose consciousness.  ED work-up was notable for scalp hematoma.  Laboratory tests are stable.  Head CT does not show any acute injuries.  Patient has remained alert and awake.  Suspect mechanical fall with scalp contusion.  Appears stable for discharge. Final Clinical Impression(s) / ED Diagnoses Final diagnoses:  Contusion of scalp, initial encounter  Hyponatremia    Rx / DC Orders ED Discharge Orders    None       Dorie Rank, MD 08/25/20 2203

## 2020-08-25 NOTE — ED Notes (Signed)
Pt transported to CT ?

## 2020-08-25 NOTE — ED Triage Notes (Signed)
Pt had unwitnessed fall striking the back of her head. Pt does not remember what caused her to fall. Pt c/o head pain

## 2020-09-11 ENCOUNTER — Other Ambulatory Visit: Payer: Self-pay

## 2020-09-11 ENCOUNTER — Emergency Department (HOSPITAL_COMMUNITY)
Admission: EM | Admit: 2020-09-11 | Discharge: 2020-09-11 | Disposition: A | Discharge status: Home / Self Care | Payer: Medicare Other | Attending: Emergency Medicine | Admitting: Emergency Medicine

## 2020-09-11 ENCOUNTER — Encounter (HOSPITAL_COMMUNITY): Payer: Self-pay | Admitting: Emergency Medicine

## 2020-09-11 DIAGNOSIS — Z79899 Other long term (current) drug therapy: Secondary | ICD-10-CM | POA: Insufficient documentation

## 2020-09-11 DIAGNOSIS — Z96653 Presence of artificial knee joint, bilateral: Secondary | ICD-10-CM | POA: Diagnosis not present

## 2020-09-11 DIAGNOSIS — J45909 Unspecified asthma, uncomplicated: Secondary | ICD-10-CM | POA: Insufficient documentation

## 2020-09-11 DIAGNOSIS — E119 Type 2 diabetes mellitus without complications: Secondary | ICD-10-CM | POA: Insufficient documentation

## 2020-09-11 DIAGNOSIS — W06XXXA Fall from bed, initial encounter: Secondary | ICD-10-CM | POA: Insufficient documentation

## 2020-09-11 DIAGNOSIS — W19XXXA Unspecified fall, initial encounter: Secondary | ICD-10-CM

## 2020-09-11 DIAGNOSIS — I10 Essential (primary) hypertension: Secondary | ICD-10-CM | POA: Diagnosis not present

## 2020-09-11 DIAGNOSIS — Z7982 Long term (current) use of aspirin: Secondary | ICD-10-CM | POA: Diagnosis not present

## 2020-09-11 DIAGNOSIS — Z853 Personal history of malignant neoplasm of breast: Secondary | ICD-10-CM | POA: Diagnosis not present

## 2020-09-11 DIAGNOSIS — Z7951 Long term (current) use of inhaled steroids: Secondary | ICD-10-CM | POA: Insufficient documentation

## 2020-09-11 DIAGNOSIS — E039 Hypothyroidism, unspecified: Secondary | ICD-10-CM | POA: Diagnosis not present

## 2020-09-11 DIAGNOSIS — S8992XA Unspecified injury of left lower leg, initial encounter: Secondary | ICD-10-CM | POA: Diagnosis present

## 2020-09-11 DIAGNOSIS — S8012XA Contusion of left lower leg, initial encounter: Secondary | ICD-10-CM | POA: Diagnosis not present

## 2020-09-11 LAB — URINALYSIS, ROUTINE W REFLEX MICROSCOPIC
Bilirubin Urine: NEGATIVE
Glucose, UA: NEGATIVE mg/dL
Hgb urine dipstick: NEGATIVE
Ketones, ur: NEGATIVE mg/dL
Leukocytes,Ua: NEGATIVE
Nitrite: NEGATIVE
Protein, ur: NEGATIVE mg/dL
Specific Gravity, Urine: 1.013 (ref 1.005–1.030)
pH: 5 (ref 5.0–8.0)

## 2020-09-11 LAB — CBC WITH DIFFERENTIAL/PLATELET
Abs Immature Granulocytes: 0.25 10*3/uL — ABNORMAL HIGH (ref 0.00–0.07)
Basophils Absolute: 0.1 10*3/uL (ref 0.0–0.1)
Basophils Relative: 1 %
Eosinophils Absolute: 0.1 10*3/uL (ref 0.0–0.5)
Eosinophils Relative: 1 %
HCT: 37.2 % (ref 36.0–46.0)
Hemoglobin: 12.3 g/dL (ref 12.0–15.0)
Immature Granulocytes: 2 %
Lymphocytes Relative: 13 %
Lymphs Abs: 1.5 10*3/uL (ref 0.7–4.0)
MCH: 30.1 pg (ref 26.0–34.0)
MCHC: 33.1 g/dL (ref 30.0–36.0)
MCV: 91 fL (ref 80.0–100.0)
Monocytes Absolute: 1.4 10*3/uL — ABNORMAL HIGH (ref 0.1–1.0)
Monocytes Relative: 12 %
Neutro Abs: 8.2 10*3/uL — ABNORMAL HIGH (ref 1.7–7.7)
Neutrophils Relative %: 71 %
Platelets: 337 10*3/uL (ref 150–400)
RBC: 4.09 MIL/uL (ref 3.87–5.11)
RDW: 13.4 % (ref 11.5–15.5)
WBC: 11.6 10*3/uL — ABNORMAL HIGH (ref 4.0–10.5)
nRBC: 0 % (ref 0.0–0.2)

## 2020-09-11 LAB — BASIC METABOLIC PANEL
Anion gap: 9 (ref 5–15)
BUN: 19 mg/dL (ref 8–23)
CO2: 24 mmol/L (ref 22–32)
Calcium: 9.3 mg/dL (ref 8.9–10.3)
Chloride: 98 mmol/L (ref 98–111)
Creatinine, Ser: 1.11 mg/dL — ABNORMAL HIGH (ref 0.44–1.00)
GFR, Estimated: 48 mL/min — ABNORMAL LOW (ref >60)
Glucose, Bld: 123 mg/dL — ABNORMAL HIGH (ref 70–99)
Potassium: 4.7 mmol/L (ref 3.5–5.1)
Sodium: 131 mmol/L — ABNORMAL LOW (ref 135–145)

## 2020-09-11 MED ORDER — SODIUM CHLORIDE 0.9 % IV BOLUS
500.0000 mL | Freq: Once | INTRAVENOUS | Status: AC
  Administered 2020-09-11: 500 mL via INTRAVENOUS

## 2020-09-11 NOTE — ED Provider Notes (Signed)
[facility] EMERGENCY DEPARTMENT Provider Note   CSN: [id number] Arrival date & time: 09/11/20  0941     History Chief Complaint  Patient presents with  . Fall    Angela Galvan is a 87 y.o. female.  HPI    87 year old female comes in a chief complaint of fall.  She has history of hypertension, depression, diabetes.  She reports that over the past several months she has had increasing falls.  She suspects that she got weaker when she was isolated because of COVID-19, and her walking has never been the same since.  Today patient had a mechanical fall when she was trying to get up from the bed.  Her walker was on the other side.  She reports that she is unsure why she falls.  He does not appear to her that it is a balance or weakness issue.  She just " falls".  She used to get physical therapy which was helping.   From this fall, she denies any head trauma.  She has no headache, neck pain, numbness, weakness, tingling, nausea, vomiting.  She denies any hip pain that is new.  She typically falls on her right side, and there is some bruising on that side.  She is not on any blood thinners.  Past Medical History:  Diagnosis Date  . Anxiety   . Arthritis   . Asthma   . Cancer [facility])    breast cancer left with mastectomy  . Cataract   . 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 Depression   . Diabetes mellitus    no meds.-diet controlled  . Diarrhea   . Diverticulitis    s/p colectomy in 1991  . GERD (gastroesophageal reflux disease)   . HOH (hard of hearing)   . HTN (hypertension)    Does not see a cardiologist  . Hyperlipidemia   . Hypothyroidism   . IBS (irritable bowel syndrome)   . S/P colonoscopy 2003   minimal internal hemorrhoids, pancolonic diverticula, biopsy of rectum: lymphoplasmacytic microscopic colitis, colon biopsies normal  . S/P colonoscopy 2012   pancolonic diverticula, nl  TI, external hemorrhoidal tag,/rectum bx no abnormalities  . S/P endoscopy 2012   normal esophagus, stenotic pyloric channel, TTS dilation  . Stroke ([place]) 08/10/2006   no residual    Patient Active Problem List   Diagnosis Date Noted  . Hypothyroidism 03/11/2019  . Hyperlipidemia 03/11/2019  . Menopause 03/11/2019  . Encounter for screening for malignant neoplasm of breast 03/11/2019  . Need for immunization against influenza 03/11/2019  . Fecal incontinence 12/10/2018  . Hoarseness 11/14/2018  . Chronic cough   . Syncope and collapse 11/03/2016  . Type 2 diabetes mellitus without complication ([place])   . Breast mass, right 01/29/2015  . IBS (irritable bowel syndrome) 10/29/2014  . 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/07/2013  . 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 Psychophysiologic insomnia 06/20/2012  . 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 Stiffness of joint, not elsewhere classified, lower leg 10/24/2011  . Difficulty in walking(719.7) 10/24/2011  . Pain in joint, lower leg 10/24/2011  . OA (osteoarthritis) of knee 09/25/2011  . GERD (gastroesophageal reflux disease) 09/15/2011  . Epigastric pain 09/29/2010  . Diarrhea 09/29/2010    Past Surgical History:  Procedure Laterality Date  . ABDOMINAL HYSTERECTOMY  1983  . ANAL RECTAL MANOMETRY N/A 01/06/2019  Procedure: ANO RECTAL MANOMETRY;  Surgeon: Mauri Pole, MD;  Location: [facility];  Service: Endoscopy;  Laterality: N/A;  . bilateral cataracts    . BIOPSY  03/23/2016   Procedure: BIOPSY;  Surgeon: Daneil Dolin, MD;  Location: [facility];  Service: Endoscopy;;  colon   . BREAST BIOPSY Left 01/10/2013   Procedure: BREAST BIOPSY;  Surgeon: Jamesetta So, MD;  Location: [facility];  Service: General;  Laterality: Left;  . BREAST LUMPECTOMY Right 01/29/2015   Procedure: RIGHT BREAST LUMPECTOMY;  Surgeon: Fanny Skates, MD;  Location: [facility];  Service: General;   Laterality: Right;  . CHOLECYSTECTOMY    . COLECTOMY    . COLONOSCOPY  09/11/01   Minimal internal hemorrhoids, otherwise normal rectum . Scattered pancolonic diverticula (few) The colonic mucosa otherwise appeared normal status post biopsies and stool sampling as described above.  . COLONOSCOPY N/A 03/23/2016   left colonic diverticulosis, otherwise normal.   . ESOPHAGEAL MANOMETRY N/A 05/13/2018   Procedure: ESOPHAGEAL MANOMETRY (EM);  Surgeon: Mauri Pole, MD;  Location: [facility];  Service: Endoscopy;  Laterality: N/A;  Dr Julious Payer  . ESOPHAGOGASTRODUODENOSCOPY  10/11/2010   Normal esophagus, small hiatal hernia,Stenotic pyloric channel likely critical.  Likely contributing to some of the patient's symptoms, status post TTS balloon dilation Normal D1-D3 status post biopsy.  D2-D3 atrophic-appearing gastric mucosa status post biopsy  . ileocolonoscopy  10/11/2010   external hemorrhoidal tag.  Normal rectum status post biopsy Pancolonic diverticula status post segmental biopsy.  Normal  terminal ileum  . JOINT REPLACEMENT Bilateral   . KNEE ARTHROPLASTY Right 10-Sep-2002  . KNEE SURGERY Bilateral    September 10, 2002- [facility]- total; knee  . [place] IMPEDANCE STUDY N/A 05/13/2018   Procedure: [place] IMPEDANCE STUDY;  Surgeon: Mauri Pole, MD;  Location: [facility];  Service: Endoscopy;  Laterality: N/A;  On PPI  . right ovarian tumor removed as teenager    . SIMPLE MASTECTOMY WITH AXILLARY SENTINEL NODE BIOPSY Left 03/07/2013   Procedure: TOTAL  MASTECTOMY WITH AXILLARY SENTINEL NODE BIOPSY;  Surgeon: Adin Hector, MD;  Location: [facility];  Service: General;  Laterality: Left;  . TOTAL KNEE ARTHROPLASTY  09/25/2011   Procedure: TOTAL KNEE ARTHROPLASTY;  Surgeon: Gearlean Alf, MD;  Location: [facility];  Service: Orthopedics;  Laterality: Left;     OB History   No obstetric history on file.     Family History  Problem Relation Age of Onset  . Stroke Mother        deceased after 3 strokes 1999-09-10   . Diabetes Father   . Heart attack Father   . Stroke Maternal Grandmother        deceased after 3 strokes  . Stroke Sister        twin sister  . Colon cancer Neg Hx     Social History   Tobacco Use  . Smoking status: Never Smoker  . Smokeless tobacco: Never Used  . Tobacco comment: Never smoked  Vaping Use  . Vaping Use: Never used  Substance Use Topics  . Alcohol use: Yes    Alcohol/week: 1.0 standard drink    Types: 1 Glasses of wine per week    Comment: occassional wine  . Drug use: No    Home Medications Prior to Admission medications   Medication Sig Start Date End Date Taking? Authorizing Provider  acetaminophen (TYLENOL) 500 MG tablet Take 500-1,000 mg by mouth every 6 (six) hours as needed for mild  pain or moderate pain. Pain     [provider]  albuterol (VENTOLIN HFA) 108 (90 Base) MCG/ACT inhaler INHALE 2 PUFFS EVERY 6 HOURS AS NEEDED FOR WHEEZING OR SHORTNESS OF BREATH Patient taking differently: Inhale 2 puffs into the lungs every 6 (six) hours as needed for wheezing or shortness of breath.  07/29/19   Margaretha Seeds, MD  ALPRAZolam Duanne Moron) 0.5 MG tablet Take 0.5 mg by mouth 2 (two) times daily as needed for sleep or anxiety.    [provider]  aspirin 325 MG tablet Take 325 mg by mouth daily.    [provider]  aspirin EC 81 MG tablet Take 81 mg by mouth daily. Swallow whole.    [provider]  atorvastatin (LIPITOR) 10 MG tablet Take 1 tablet (10 mg total) by mouth daily after breakfast. 05/05/19   Corum, Rex Kras, MD  BREO ELLIPTA 200-25 MCG/INH AEPB Inhale 1 puff into the lungs daily. 10/28/19   [provider]  clidinium-chlordiazePOXIDE (LIBRAX) 5-2.5 MG capsule Use once daily in the morning. Further doses as needed up to 4 a day Patient taking differently: Take 1 capsule by mouth daily as needed (constipation).  02/11/19   Carlis Stable, NP  FLUoxetine (PROZAC) 20 MG capsule TAKE ONE CAPSULE BY MOUTH ONCE  DAILY. Patient taking differently: Take 20 mg by mouth every morning.  10/28/19   Corum, Rex Kras, MD  fluticasone (FLONASE) 50 MCG/ACT nasal spray Place 1 spray into both nostrils daily. Patient taking differently: Place 1 spray into both nostrils daily as needed for allergies or rhinitis.  08/25/19   Margaretha Seeds, MD  fluticasone furoate-vilanterol (BREO ELLIPTA) 100-25 MCG/INH AEPB INHALE 1 PUFF INTO THE LUNGS DAILY. Patient not taking: Reported on 12/01/2019 08/25/19   Margaretha Seeds, MD  gabapentin (NEURONTIN) 300 MG capsule TAKE 1 CAPSULE BY MOUTH 3 TIMES A DAY AS NEEDED Patient taking differently: Take 300 mg by mouth at bedtime.  06/02/19   Margaretha Seeds, MD  levothyroxine (SYNTHROID) 75 MCG tablet Take 1 tablet (75 mcg total) by mouth daily. Patient taking differently: Take 75 mcg by mouth daily before breakfast.  07/11/19   Corum, Rex Kras, MD  levothyroxine (SYNTHROID) 88 MCG tablet Take 88 mcg by mouth daily before breakfast.    [provider]  loperamide (IMODIUM) 2 MG capsule Take 2 mg by mouth as needed for diarrhea or loose stools.    [provider]  loratadine (CLARITIN) 10 MG tablet Take 10 mg by mouth daily.    [provider]  losartan (COZAAR) 100 MG tablet Take 1 tablet (100 mg total) by mouth daily. 05/05/19   Corum, Rex Kras, MD  RABEprazole (ACIPHEX) 20 MG tablet TAKE 1 TABLET BY MOUTH 2 TIMES DAILY Patient taking differently: Take 20 mg by mouth in the morning and at bedtime.  12/26/18   Mahala Menghini, PA-C  RESTASIS 0.05 % ophthalmic emulsion Place 1 drop into both eyes 2 (two) times daily.  08/21/19   [provider]  sertraline (ZOLOFT) 100 MG tablet Take 100 mg by mouth daily.    [provider]    Allergies    Patient has no known allergies.  Review of Systems   Review of Systems  Constitutional: Positive for activity change.  Respiratory: Negative for shortness of breath.   Cardiovascular: Negative for chest pain.   Gastrointestinal: Negative for nausea and vomiting.  Neurological: Negative for dizziness, speech difficulty, weakness, light-headedness and headaches.  Hematological: Does not bruise/bleed easily.  All other systems reviewed and are negative.   Physical Exam Updated Vital Signs BP (!) 154/65   Pulse 77   Temp 98.1 F (36.7 C) (Oral)   Resp 17   Ht 5' (1.524 m)   Wt 56.2 kg   SpO2 99%   BMI 24.22 kg/m   Physical Exam Vitals and nursing note reviewed.  Constitutional:      Appearance: She is well-developed.  HENT:     Head: Normocephalic and atraumatic.  Eyes:     Extraocular Movements: Extraocular movements intact.     Pupils: Pupils are equal, round, and reactive to light.  Cardiovascular:     Rate and Rhythm: Normal rate.  Pulmonary:     Effort: Pulmonary effort is normal.  Abdominal:     General: Bowel sounds are normal.  Musculoskeletal:     Cervical back: Normal range of motion and neck supple.     Comments: Patient has no tenderness on her exam.  Head to toe evaluation shows no hematoma, bleeding of the scalp, no facial abrasions, no spine step offs, crepitus of the chest or neck, no tenderness to palpation of the bilateral upper and lower extremities, no gross deformities, no chest tenderness, no pelvic pain.  Patient ambulated with me and the RN without significant difficulty.  No ataxia noted  Skin:    General: Skin is warm and dry.  Neurological:     Mental Status: She is alert and oriented to person, place, and time.     Cranial Nerves: No cranial nerve deficit.     Sensory: No sensory deficit.     Comments: No dysmetria     ED Results / Procedures / Treatments   Labs (all labs ordered are listed, but only abnormal results are displayed) Labs Reviewed  BASIC METABOLIC PANEL - Abnormal; Notable for the following components:      Result Value   Sodium 131 (*)    Glucose, Bld 123 (*)    Creatinine, Ser 1.11 (*)    GFR, Estimated 48 (*)    All  other components within normal limits  CBC WITH DIFFERENTIAL/PLATELET - Abnormal; Notable for the following components:   WBC 11.6 (*)    Neutro Abs 8.2 (*)    Monocytes Absolute 1.4 (*)    Abs Immature Granulocytes 0.25 (*)    All other components within normal limits  URINALYSIS, ROUTINE W REFLEX MICROSCOPIC    EKG EKG Interpretation  Date/Time:  Saturday September 11 2020 12:03:53 EDT Ventricular Rate:  73 PR Interval:  149 QRS Duration: 99 QT Interval:  427 QTC Calculation: 471 R Axis:   20 Text Interpretation: Sinus rhythm RSR' in V1 or V2, probably normal variant No acute changes No significant change since last tracing Confirmed by Varney Biles [phone]) on 09/11/2020 1:23:59 PM   Radiology No results found.  Procedures Procedures   Medications Ordered in ED Medications  sodium chloride 0.9 % bolus 500 mL (0 mLs Intravenous Stopped 09/11/20 1307)    ED Course  I have reviewed the triage vital signs and the nursing notes.  Pertinent labs & imaging results that were available during my care of the patient were reviewed by me and considered in my medical decision making (see chart for details).    MDM Rules/Calculators/A&P                          Patient comes in with chief  complaint of fall.  She has had few falls over the last several months.  It appears that her falls have become more frequent recently. Today's fall was mechanical fall as well.  She fell onto her right side.  She denies head trauma or any red flags suggesting elevated intracranial pressure.  She has no neck pain or any upper or lower extremity weakness  On our exam, patient able to ambulate.  The strength is 4+ out of 5 for both upper and lower extremities.  Neuro exam is also not revealing any dysmetria.  Given the increase frequency in her fall, we did get basic labs which are reassuring.  No metabolic derangements and we were not suspecting clinical UTI.   It is unclear why she is having  increased falls.  We have recommended the SNF to allow for the patient to get PCP or neurology consultation for her increased fall if the frequency of the fall in fact is much worse than it was before and sudden/subacute.  We have also requested further PT evaluation at the facility.  Patient has been monitored in the ER for over 4 hours.  She has no change in neuro exam.  She continues to have no headaches or neurologic complaints.  Brain and C-spine are cleared clinically, patient is comfortable with not getting CT head.  She does not think she struck her head at any point.  I have reviewed the prior to CT scans, there is no evidence of large ventricles or hydrocephalus.  Stable for discharge.  Final Clinical Impression(s) / ED Diagnoses Final diagnoses:  Fall, initial encounter    Rx / DC Orders ED Discharge Orders    None       Varney Biles, MD 09/11/20 1355

## 2020-09-11 NOTE — ED Notes (Signed)
Assisted pt up to bedside commode.  Pt required minimum assistance.

## 2020-09-11 NOTE — ED Triage Notes (Signed)
Right hip noted to bruise and indention. No shortening or rotating noted.

## 2020-09-11 NOTE — ED Notes (Signed)
Report called to Levada Dy at [facility]

## 2020-09-11 NOTE — ED Notes (Signed)
Ambulated with provider to hallway and back with standby assist.

## 2020-09-11 NOTE — ED Triage Notes (Signed)
Pt arrived by [facility]. Pt has had two falls d/t weakness she states. Denies LOC. Denies hitting her head. Pt c/o of left leg pain  And right buttocks pain. No obvious deformities of the leg.

## 2020-09-11 NOTE — Discharge Instructions (Addendum)
Angela Galvan was seen in the ER after she had a fall. Her blood work is normal and reassuring.  She reports multiple falls in the last several weeks.  If this is a new development over the last few months, then it is prudent that her primary care doctor or a neurologist assesses her to ensure there is no organic cause for her symptoms such as vitamin deficiency, muscle weakness etc. we recommend a provider at your facility make that judgment or allow Angela Galvan to see the PCP who can make that decision.  Please continue giving her physical therapy, and encouraging use of supportive medical equipments like walker.

## 2020-09-21 ENCOUNTER — Ambulatory Visit: Payer: Medicare Other | Admitting: Pulmonary Disease

## 2020-09-21 ENCOUNTER — Encounter: Payer: Self-pay | Admitting: Pulmonary Disease

## 2020-09-21 ENCOUNTER — Other Ambulatory Visit: Payer: Self-pay

## 2020-09-21 VITALS — BP 140/68 | HR 78 | Temp 97.3°F | Ht 60.0 in | Wt 124.0 lb

## 2020-09-21 DIAGNOSIS — R942 Abnormal results of pulmonary function studies: Secondary | ICD-10-CM

## 2020-09-21 DIAGNOSIS — J984 Other disorders of lung: Secondary | ICD-10-CM | POA: Diagnosis not present

## 2020-09-21 DIAGNOSIS — R053 Chronic cough: Secondary | ICD-10-CM

## 2020-09-21 DIAGNOSIS — R0602 Shortness of breath: Secondary | ICD-10-CM

## 2020-09-21 NOTE — Progress Notes (Signed)
Synopsis: Referred in 04/2018 for chronic cough  Subjective:   PATIENT ID: Angela Galvan: female DOB: [birth year], MRN: [id number]   HPI  Chief Complaint  Patient presents with  . Follow-up    Sob with exertion.    Angela Galvan is a 87 year old female never smoker with type 2 diabetes, osteoarthritis, GERD presents for follow-up.  She has previously seen me for cough and has been treated with Breo and gabapentin.  On her last visit we tried to reduce her Breo to every other day however she noticed increased coughing and had a telemetry visit with PCCM-NP.  Advised to resume daily Breo and her cough has improved.  Continues to remain unproductive but is not near the severity as it was before.  Her primary concern for this visit is her persistent hoarseness.  She has an appointment with ENT scheduled for next week.  She used to sing in choir at church however since she had developed her cough, she has not been able to participate.  She notices that after a few minutes of talking she can lose her voice.  Her hoarseness worsens with emotion.  Denies audible wheezing or shortness of breath.  She takes Claritin for her allergies.  Denies rhinorrhea.  Reports that her reflux is well controlled.  09/21/20 Our last visit was on 08/25/19. She was recently started on Trelegy to help with shortness of breath. Will have nonproductive cough. Denies wheezing or chest congestion. She has been falling multiple in the last year requiring ED visits and currently living in assisted living. Denies shortness of breath or cough associated with falls. Has been worked up by Neurology with no cause. Ambulates with a roller walker. During covid she was confined to her room, limiting her activity. She expresses being afraid to move.   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   I have personally reviewed patient's past medical/family/social history/allergies/current medications.  Past Medical  History:  Diagnosis Date  . Anxiety   . Arthritis   . Asthma   . Cancer [facility])    breast cancer left with mastectomy  . Cataract   . 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 Depression   . Diabetes mellitus    no meds.-diet controlled  . Diarrhea   . Diverticulitis    s/p colectomy in 1991  . GERD (gastroesophageal reflux disease)   . HOH (hard of hearing)   . HTN (hypertension)    Does not see a cardiologist  . Hyperlipidemia   . Hypothyroidism   . IBS (irritable bowel syndrome)   . S/P colonoscopy 2003   minimal internal hemorrhoids, pancolonic diverticula, biopsy of rectum: lymphoplasmacytic microscopic colitis, colon biopsies normal  . S/P colonoscopy 2012   pancolonic diverticula, nl TI, external hemorrhoidal tag,/rectum bx no abnormalities  . S/P endoscopy 2012   normal esophagus, stenotic pyloric channel, TTS dilation  . Stroke ([place]) 08/10/2006   no residual     Outpatient Medications Prior to Visit  Medication Sig Dispense Refill  . acetaminophen (TYLENOL) 500 MG tablet Take 500-1,000 mg by mouth every 6 (six) hours as needed for mild pain or moderate pain. Pain     . albuterol (VENTOLIN HFA) 108 (90 Base) MCG/ACT inhaler INHALE 2 PUFFS EVERY 6 HOURS AS NEEDED FOR WHEEZING OR SHORTNESS OF BREATH (Patient taking differently: Inhale 2 puffs into the lungs every 6 (six)  hours as needed for wheezing or shortness of breath.) 8.5 g 3  . ALPRAZolam (XANAX) 0.5 MG tablet Take 0.5 mg by mouth 2 (two) times daily as needed for sleep or anxiety.    Marland Kitchen aspirin 325 MG tablet Take 325 mg by mouth daily.    Marland Kitchen aspirin EC 81 MG tablet Take 81 mg by mouth daily. Swallow whole.    Marland Kitchen atorvastatin (LIPITOR) 10 MG tablet Take 1 tablet (10 mg total) by mouth daily after breakfast. 90 tablet 0  . clidinium-chlordiazePOXIDE (LIBRAX) 5-2.5 MG capsule Use once daily in the morning. Further doses as needed up to  4 a day (Patient taking differently: Take 1 capsule by mouth daily as needed (constipation).) 40 capsule 5  . FLUoxetine (PROZAC) 20 MG capsule TAKE ONE CAPSULE BY MOUTH ONCE DAILY. (Patient taking differently: Take 20 mg by mouth every morning.) 30 capsule 0  . fluticasone (FLONASE) 50 MCG/ACT nasal spray Place 1 spray into both nostrils daily. (Patient taking differently: Place 1 spray into both nostrils daily as needed for allergies or rhinitis.) 16 mL 12  . gabapentin (NEURONTIN) 300 MG capsule TAKE 1 CAPSULE BY MOUTH 3 TIMES A DAY AS NEEDED (Patient taking differently: Take 300 mg by mouth at bedtime.) 90 capsule 3  . levothyroxine (SYNTHROID) 75 MCG tablet Take 1 tablet (75 mcg total) by mouth daily. (Patient taking differently: Take 75 mcg by mouth daily before breakfast.) 90 tablet 3  . levothyroxine (SYNTHROID) 88 MCG tablet Take 88 mcg by mouth daily before breakfast.    . loperamide (IMODIUM) 2 MG capsule Take 2 mg by mouth as needed for diarrhea or loose stools.    Marland Kitchen loratadine (CLARITIN) 10 MG tablet Take 10 mg by mouth daily.    Marland Kitchen losartan (COZAAR) 100 MG tablet Take 1 tablet (100 mg total) by mouth daily. 90 tablet 0  . RABEprazole (ACIPHEX) 20 MG tablet TAKE 1 TABLET BY MOUTH 2 TIMES DAILY (Patient taking differently: Take 20 mg by mouth in the morning and at bedtime.) 180 tablet 2  . RESTASIS 0.05 % ophthalmic emulsion Place 1 drop into both eyes 2 (two) times daily.     . sertraline (ZOLOFT) 100 MG tablet Take 100 mg by mouth daily.    . fluticasone furoate-vilanterol (BREO ELLIPTA) 100-25 MCG/INH AEPB INHALE 1 PUFF INTO THE LUNGS DAILY. 60 each 12  . BREO ELLIPTA 200-25 MCG/INH AEPB Inhale 1 puff into the lungs daily. (Patient not taking: Reported on 09/21/2020)    . gabapentin (NEURONTIN) 100 MG capsule Take by mouth.    . meloxicam (MOBIC) 15 MG tablet Take 1 tablet by mouth daily.    . TRELEGY ELLIPTA 100-62.5-25 MCG/INH AEPB Inhale 1 puff into the lungs daily.     No  facility-administered medications prior to visit.    Review of Systems  Constitutional: Negative for chills, diaphoresis, fever, malaise/fatigue and weight loss.  HENT: Negative for congestion.   Respiratory: Negative for cough, hemoptysis, sputum production, shortness of breath and wheezing.        Hoarseness  Cardiovascular: Negative for chest pain, palpitations and leg swelling.     Objective:    Vitals:   09/21/20 1559  BP: 140/68  Pulse: 78  Temp: (!) 97.3 F (36.3 C)  SpO2: 98%  Weight: 124 lb (56.2 kg)  Height: 5' (1.524 m)   SpO2: 98 % O2 Device: None (Room air)  Physical Exam: General: Elderly-appearing, no acute distress HENT: [place], AT Eyes: EOMI, no scleral icterus  Respiratory: Clear to auscultation bilaterally.  No crackles, wheezing or rales Cardiovascular: RRR, -M/R/G, no JVD Extremities:-Edema,-tenderness Neuro: AAO x4, CNII-XII grossly intact Skin: Intact, no rashes or bruising Psych: Normal mood, normal affect  Chest imaging:  CXR 03/06/18 No acute infiltrate, effusion or edema  CT Chest 06/20/18 Normal lung airways and parenchyma. No infiltrate, effusion or edema.  PFT:  06/06/18- FEV1/FVC 83 FEV1 1.42 (90%) FVC 1.71 (80%). TLC 70 No sig BD. DLCO 56%.  Borderline mild restrictive defect with reduced DLCO may suggest early ILD. Will need to clinically correlate  Imaging, labs and test noted above have been reviewed independently by me.    Assessment & Plan:   Shortness of breath, multifactorial  Restrictive defect with isolated diffusion defect Chronic cough Hoarseness - unchanged Deconditioning  Discussion: 87 year old female never smoker who presents for follow-up. She has had worsening shortness of breath in the last several months. Activity has been limited at her assisted living facility during the pandemic. She was started on Trelegy for worsening shortness of breath. Prior PFTs in 2020 demonstrated a mild restrictive defect with  moderately reduced gas exchange however CT scan at the time did not show evidence of ILD. Echocardiogram in 2018 with normal EF and no WMA, no suggestion of PH. We discussed benefits of pulmonary rehab if she is a candidate. Only able to complete one lap of ambulatory O2 due to fatigue with no desaturations.  --CONTINUE Trelegy ONE puff ONCE a day. Please monitor for hoarseness --Refer to Pulmonary Rehab  Orders Placed This Encounter  Procedures  . AMB referral to pulmonary rehabilitation    Referral Priority:   Routine    Referral Type:   Consultation    Number of Visits Requested:   1  . ECHOCARDIOGRAM COMPLETE    Standing Status:   Future    Standing Expiration Date:   09/21/2021    Order Specific Question:   Where should this test be performed    Answer:   [facility]    Order Specific Question:   Perflutren DEFINITY (image enhancing agent) should be administered unless hypersensitivity or allergy exist    Answer:   Administer Perflutren    Order Specific Question:   Reason for exam-Echo    Answer:   Other-Full Diagnosis List    Order Specific Question:   Full ICD-10/Reason for Exam    Answer:   Restrictive lung disease [208215]   No orders of the defined types were placed in this encounter.  Return in about 3 months (around 12/21/2020).  Dionel Archey Rodman Pickle, MD [facility] 09/21/2020 4:09 PM

## 2020-09-21 NOTE — Patient Instructions (Signed)
--  CONTINUE Trelegy ONE puff ONCE a day. Please monitor for hoarseness --Refer to Pulmonary Rehab

## 2020-10-01 ENCOUNTER — Encounter: Payer: Self-pay | Admitting: Pulmonary Disease

## 2020-10-01 DIAGNOSIS — R942 Abnormal results of pulmonary function studies: Secondary | ICD-10-CM | POA: Insufficient documentation

## 2020-10-01 DIAGNOSIS — R0602 Shortness of breath: Secondary | ICD-10-CM | POA: Insufficient documentation

## 2020-10-01 DIAGNOSIS — J984 Other disorders of lung: Secondary | ICD-10-CM | POA: Insufficient documentation

## 2020-10-14 ENCOUNTER — Other Ambulatory Visit: Payer: Self-pay

## 2020-10-14 ENCOUNTER — Encounter (HOSPITAL_COMMUNITY)
Admission: RE | Admit: 2020-10-14 | Discharge: 2020-10-14 | Disposition: A | Discharge status: Home / Self Care | Payer: Medicare Other | Source: Ambulatory Visit | Attending: Pulmonary Disease | Admitting: Pulmonary Disease

## 2020-10-14 VITALS — BP 122/56 | HR 76 | Ht 60.0 in | Wt 124.3 lb

## 2020-10-14 DIAGNOSIS — R942 Abnormal results of pulmonary function studies: Secondary | ICD-10-CM | POA: Insufficient documentation

## 2020-10-14 DIAGNOSIS — R0602 Shortness of breath: Secondary | ICD-10-CM | POA: Insufficient documentation

## 2020-10-14 DIAGNOSIS — R053 Chronic cough: Secondary | ICD-10-CM

## 2020-10-14 NOTE — Progress Notes (Signed)
Pulmonary Individual Treatment Plan  Patient Details  Name: Angela Galvan MRN: [id number] Date of Birth: July 15, 1932 Referring Provider:   [place] from 10/14/2020 in [place]  Referring Provider Dr. [name]      Initial Encounter Date:  Flowsheet Row PULMONARY REHAB OTHER RESP ORIENTATION from 10/14/2020 in [place]  Date 10/14/20      Visit Diagnosis: Shortness of breath  Decreased diffusion capacity  Chronic cough  Patient's Home Medications on Admission:   Current Outpatient Medications:  .  acetaminophen (TYLENOL) 500 MG tablet, Take 500-1,000 mg by mouth every 6 (six) hours as needed for mild pain or moderate pain. Pain , Disp: , Rfl:  .  albuterol (VENTOLIN HFA) 108 (90 Base) MCG/ACT inhaler, INHALE 2 PUFFS EVERY 6 HOURS AS NEEDED FOR WHEEZING OR SHORTNESS OF BREATH (Patient taking differently: Inhale 2 puffs into the lungs every 6 (six) hours as needed for wheezing or shortness of breath.), Disp: 8.5 g, Rfl: 3 .  ALPRAZolam (XANAX) 0.5 MG tablet, Take 0.5 mg by mouth 2 (two) times daily as needed for sleep or anxiety., Disp: , Rfl:  .  aspirin 325 MG tablet, Take 325 mg by mouth daily., Disp: , Rfl:  .  aspirin EC 81 MG tablet, Take 81 mg by mouth daily. Swallow whole., Disp: , Rfl:  .  atorvastatin (LIPITOR) 10 MG tablet, Take 1 tablet (10 mg total) by mouth daily after breakfast., Disp: 90 tablet, Rfl: 0 .  BREO ELLIPTA 200-25 MCG/INH AEPB, Inhale 1 puff into the lungs daily. (Patient not taking: Reported on 09/21/2020), Disp: , Rfl:  .  clidinium-chlordiazePOXIDE (LIBRAX) 5-2.5 MG capsule, Use once daily in the morning. Further doses as needed up to 4 a day (Patient taking differently: Take 1 capsule by mouth daily as needed (constipation).), Disp: 40 capsule, Rfl: 5 .  FLUoxetine (PROZAC) 20 MG capsule, TAKE ONE CAPSULE BY MOUTH ONCE DAILY. (Patient taking differently: Take 20 mg by  mouth every morning.), Disp: 30 capsule, Rfl: 0 .  fluticasone (FLONASE) 50 MCG/ACT nasal spray, Place 1 spray into both nostrils daily. (Patient taking differently: Place 1 spray into both nostrils daily as needed for allergies or rhinitis.), Disp: 16 mL, Rfl: 12 .  gabapentin (NEURONTIN) 100 MG capsule, Take by mouth., Disp: , Rfl:  .  gabapentin (NEURONTIN) 300 MG capsule, TAKE 1 CAPSULE BY MOUTH 3 TIMES A DAY AS NEEDED (Patient taking differently: Take 300 mg by mouth at bedtime.), Disp: 90 capsule, Rfl: 3 .  levothyroxine (SYNTHROID) 75 MCG tablet, Take 1 tablet (75 mcg total) by mouth daily. (Patient taking differently: Take 75 mcg by mouth daily before breakfast.), Disp: 90 tablet, Rfl: 3 .  levothyroxine (SYNTHROID) 88 MCG tablet, Take 88 mcg by mouth daily before breakfast., Disp: , Rfl:  .  loperamide (IMODIUM) 2 MG capsule, Take 2 mg by mouth as needed for diarrhea or loose stools., Disp: , Rfl:  .  loratadine (CLARITIN) 10 MG tablet, Take 10 mg by mouth daily., Disp: , Rfl:  .  losartan (COZAAR) 100 MG tablet, Take 1 tablet (100 mg total) by mouth daily., Disp: 90 tablet, Rfl: 0 .  meloxicam (MOBIC) 15 MG tablet, Take 1 tablet by mouth daily., Disp: , Rfl:  .  RABEprazole (ACIPHEX) 20 MG tablet, TAKE 1 TABLET BY MOUTH 2 TIMES DAILY (Patient taking differently: Take 20 mg by mouth in the morning and at bedtime.), Disp: 180 tablet, Rfl: 2 .  RESTASIS 0.05 % ophthalmic emulsion, Place 1 drop into both eyes 2 (two) times daily. , Disp: , Rfl:  .  sertraline (ZOLOFT) 100 MG tablet, Take 100 mg by mouth daily., Disp: , Rfl:  .  TRELEGY ELLIPTA 100-62.5-25 MCG/INH AEPB, Inhale 1 puff into the lungs daily., Disp: , Rfl:   Past Medical History: Past Medical History:  Diagnosis Date  . Anxiety   . Arthritis   . Asthma   . Cancer [facility])    breast cancer left with mastectomy  . Cataract   . 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 Depression   . Diabetes mellitus    no meds.-diet controlled  . Diarrhea   . Diverticulitis    s/p colectomy in 1991  . GERD (gastroesophageal reflux disease)   . HOH (hard of hearing)   . HTN (hypertension)    Does not see a cardiologist  . Hyperlipidemia   . Hypothyroidism   . IBS (irritable bowel syndrome)   . S/P colonoscopy 2003   minimal internal hemorrhoids, pancolonic diverticula, biopsy of rectum: lymphoplasmacytic microscopic colitis, colon biopsies normal  . S/P colonoscopy 2012   pancolonic diverticula, nl TI, external hemorrhoidal tag,/rectum bx no abnormalities  . S/P endoscopy 2012   normal esophagus, stenotic pyloric channel, TTS dilation  . Stroke ([place]) 08/10/2006   no residual    Tobacco Use: Social History   Tobacco Use  Smoking Status Never Smoker  Smokeless Tobacco Never Used  Tobacco Comment   Never smoked    Labs: Recent Review Flowsheet Data    Labs for ITP Cardiac and Pulmonary Rehab Latest Ref Rng & Units 11/04/2016 09/12/2017 01/29/2018 11/08/2018 03/11/2019   Cholestrol 0 - 200 - - 142 - -   LDLCALC - - - 44 - -   HDL 35 - 70 - - 70 - -   Trlycerides 40 - 160 - - 141 - -   Hemoglobin A1c 4.0 - 5.6 % 5.5 6.0 5.9 5.9 6.1(A)      Capillary Blood Glucose: Lab Results  Component Value Date   GLUCAP 175 (H) 12/02/2019   GLUCAP 108 (H) 11/04/2016   GLUCAP 132 (H) 11/03/2016   GLUCAP 107 (H) 11/03/2016   GLUCAP 117 (H) 01/29/2015     Pulmonary Assessment Scores:  Pulmonary Assessment Scores    Row Name 10/14/20 1448         ADL UCSD   SOB Score total 50           CAT Score   CAT Score 21           mMRC Score   mMRC Score 3           UCSD: Self-administered rating of dyspnea associated with activities of daily living (ADLs) 6-point scale (0 = "not at all" to 5 = "maximal or unable to do because of breathlessness")  Scoring Scores range from 0 to 120.  Minimally important difference is 5  units  CAT: CAT can identify the health impairment of COPD patients and is better correlated with disease progression.  CAT has a scoring range of zero to 40. The CAT score is classified into four groups of low (less than 10), medium (10 - 20), high (21-30) and very high (31-40) based on the impact level of disease on health status. A CAT score over 10 suggests significant symptoms.  A worsening CAT  score could be explained by an exacerbation, poor medication adherence, poor inhaler technique, or progression of COPD or comorbid conditions.  CAT MCID is 2 points  mMRC: mMRC (Modified Medical Research Council) Dyspnea Scale is used to assess the degree of baseline functional disability in patients of respiratory disease due to dyspnea. No minimal important difference is established. A decrease in score of 1 point or greater is considered a positive change.   Pulmonary Function Assessment:   Exercise Target Goals: Exercise Program Goal: Individual exercise prescription set using results from initial 6 min walk test and THRR while considering  patient's activity barriers and safety.   Exercise Prescription Goal: Initial exercise prescription builds to 30-45 minutes a day of aerobic activity, 2-3 days per week.  Home exercise guidelines will be given to patient during program as part of exercise prescription that the participant will acknowledge.  Activity Barriers & Risk Stratification:  Activity Barriers & Cardiac Risk Stratification - 10/14/20 1451      Activity Barriers & Cardiac Risk Stratification   Activity Barriers Arthritis;Left Knee Replacement;Right Knee Replacement;Back Problems;Neck/Spine Problems;Joint Problems;Deconditioning;Shortness of Breath;Balance Concerns;History of Falls;Assistive Device    Cardiac Risk Stratification Moderate           6 Minute Walk:  6 Minute Walk    Row Name 10/14/20 1604         6 Minute Walk   Phase Initial     Distance 800 feet     Walk  Time 6 minutes     # of Rest Breaks 1     MPH 1.52     METS 1.13     RPE 13     Perceived Dyspnea  15     VO2 Peak 3.96     Symptoms Yes (comment)     Comments 1 seated rest break due to shortness of breath     Resting HR 76 bpm     Resting BP 122/56     Resting Oxygen Saturation  95 %     Exercise Oxygen Saturation  during 6 min walk 93 %     Max Ex. HR 102 bpm     Max Ex. BP 124/52     2 Minute Post BP 116/60           Interval HR   1 Minute HR 94     2 Minute HR 100     3 Minute HR 102     4 Minute HR 100     5 Minute HR 101     6 Minute HR 102     2 Minute Post HR 83     Interval Heart Rate? Yes           Interval Oxygen   Interval Oxygen? Yes     Baseline Oxygen Saturation % 95 %     1 Minute Oxygen Saturation % 93 %     1 Minute Liters of Oxygen 0 L     2 Minute Oxygen Saturation % 94 %     2 Minute Liters of Oxygen 0 L     3 Minute Oxygen Saturation % 95 %     3 Minute Liters of Oxygen 0 L     4 Minute Oxygen Saturation % 95 %     4 Minute Liters of Oxygen 0 L     5 Minute Oxygen Saturation % 95 %     5 Minute Liters of Oxygen 0 L  6 Minute Oxygen Saturation % 96 %     6 Minute Liters of Oxygen 0 L     2 Minute Post Oxygen Saturation % 96 %     2 Minute Post Liters of Oxygen 0 L            Oxygen Initial Assessment:  Oxygen Initial Assessment - 10/14/20 1609      Initial 6 min Walk   Oxygen Used None      Program Oxygen Prescription   Program Oxygen Prescription None      Intervention   Short Term Goals To learn and exhibit compliance with exercise, home and travel O2 prescription;To learn and understand importance of monitoring SPO2 with pulse oximeter and demonstrate accurate use of the pulse oximeter.;To learn and understand importance of maintaining oxygen saturations>88%;To learn and demonstrate proper pursed lip breathing techniques or other breathing techniques.;To learn and demonstrate proper use of respiratory medications    Long  Term  Goals Exhibits compliance with exercise, home and travel O2 prescription;Verbalizes importance of monitoring SPO2 with pulse oximeter and return demonstration;Maintenance of O2 saturations>88%;Exhibits proper breathing techniques, such as pursed lip breathing or other method taught during program session;Compliance with respiratory medication           Oxygen Re-Evaluation:   Oxygen Discharge (Final Oxygen Re-Evaluation):   Initial Exercise Prescription:  Initial Exercise Prescription - 10/14/20 1600      Date of Initial Exercise RX and Referring Provider   Date 10/14/20    Referring Provider Dr. [name]    Expected Discharge Date 02/17/21      NuStep   Level 1    SPM 60    Minutes 39      Prescription Details   Frequency (times per week) 2    Duration Progress to 30 minutes of continuous aerobic without signs/symptoms of physical distress      Intensity   THRR 40-80% of Max Heartrate 54-107    Ratings of Perceived Exertion 11-13    Perceived Dyspnea 0-4      Resistance Training   Training Prescription Yes    Weight 1 lbs    Reps 10-15           Perform Capillary Blood Glucose checks as needed.  Exercise Prescription Changes:   Exercise Comments:   Exercise Goals and Review:  Exercise Goals    Row Name 10/14/20 1608             Exercise Goals   Increase Physical Activity Yes       Intervention Provide advice, education, support and counseling about physical activity/exercise needs.;Develop an individualized exercise prescription for aerobic and resistive training based on initial evaluation findings, risk stratification, comorbidities and participant's personal goals.       Expected Outcomes Short Term: Attend rehab on a regular basis to increase amount of physical activity.;Long Term: Add in home exercise to make exercise part of routine and to increase amount of physical activity.;Long Term: Exercising regularly at least 3-5 days a week.        Increase Strength and Stamina Yes       Intervention Provide advice, education, support and counseling about physical activity/exercise needs.;Develop an individualized exercise prescription for aerobic and resistive training based on initial evaluation findings, risk stratification, comorbidities and participant's personal goals.       Expected Outcomes Short Term: Increase workloads from initial exercise prescription for resistance, speed, and METs.;Short Term: Perform resistance training exercises routinely during rehab and add  in resistance training at home;Long Term: Improve cardiorespiratory fitness, muscular endurance and strength as measured by increased METs and functional capacity (6MWT)       Able to understand and use rate of perceived exertion (RPE) scale Yes       Intervention Provide education and explanation on how to use RPE scale       Expected Outcomes Short Term: Able to use RPE daily in rehab to express subjective intensity level;Long Term:  Able to use RPE to guide intensity level when exercising independently       Able to understand and use Dyspnea scale Yes       Intervention Provide education and explanation on how to use Dyspnea scale       Expected Outcomes Short Term: Able to use Dyspnea scale daily in rehab to express subjective sense of shortness of breath during exertion;Long Term: Able to use Dyspnea scale to guide intensity level when exercising independently       Knowledge and understanding of Target Heart Rate Range (THRR) Yes       Intervention Provide education and explanation of THRR including how the numbers were predicted and where they are located for reference       Expected Outcomes Short Term: Able to state/look up THRR;Long Term: Able to use THRR to govern intensity when exercising independently;Short Term: Able to use daily as guideline for intensity in rehab       Understanding of Exercise Prescription Yes       Intervention Provide education,  explanation, and written materials on patient's individual exercise prescription       Expected Outcomes Short Term: Able to explain program exercise prescription;Long Term: Able to explain home exercise prescription to exercise independently              Exercise Goals Re-Evaluation :   Discharge Exercise Prescription (Final Exercise Prescription Changes):   Nutrition:  Target Goals: Understanding of nutrition guidelines, daily intake of sodium 1500mg , cholesterol 200mg , calories 30% from fat and 7% or less from saturated fats, daily to have 5 or more servings of fruits and vegetables.  Biometrics:  Pre Biometrics - 10/14/20 1609      Pre Biometrics   Height 5' (1.524 m)    Weight 124 lb 5.4 oz (56.4 kg)    Waist Circumference 41.5 inches    Hip Circumference 37 inches    Waist to Hip Ratio 1.12 %    BMI (Calculated) 24.28    Triceps Skinfold 22 mm    % Body Fat 40.3 %    Grip Strength 16 kg    Flexibility 0 in    Single Leg Stand 0 seconds            Nutrition Therapy Plan and Nutrition Goals:   Nutrition Assessments:  Nutrition Assessments - 10/14/20 1453      MEDFICTS Scores   Pre Score 43          MEDIFICTS Score Key:  ?70 Need to make dietary changes   40-70 Heart Healthy Diet  ? 40 Therapeutic Level Cholesterol Diet   Picture Your Plate Scores:  <93 Unhealthy dietary pattern with much room for improvement.  41-50 Dietary pattern unlikely to meet recommendations for good health and room for improvement.  51-60 More healthful dietary pattern, with some room for improvement.   >60 Healthy dietary pattern, although there may be some specific behaviors that could be improved.    Nutrition Goals Re-Evaluation:   Nutrition Goals  Discharge (Final Nutrition Goals Re-Evaluation):   Psychosocial: Target Goals: Acknowledge presence or absence of significant depression and/or stress, maximize coping skills, provide positive support system.  Participant is able to verbalize types and ability to use techniques and skills needed for reducing stress and depression.  Initial Review & Psychosocial Screening:  Initial Psych Review & Screening - 10/14/20 1452      Initial Review   Current issues with Current Depression      Family Dynamics   Good Support System? Yes    Comments Her daughter, son in law, grand daughter, and cousin all support her.      Barriers   Psychosocial barriers to participate in program The patient should benefit from training in stress management and relaxation.      Screening Interventions   Interventions Encouraged to exercise    Expected Outcomes Long Term goal: The participant improves quality of Life and PHQ9 Scores as seen by post scores and/or verbalization of changes;Short Term goal: Identification and review with participant of any Quality of Life or Depression concerns found by scoring the questionnaire.           Quality of Life Scores:  Quality of Life - 10/14/20 1557      Quality of Life   Select Quality of Life      Quality of Life Scores   Health/Function Pre 20.4 %    Socioeconomic Pre 30 %    Psych/Spiritual Pre 30 %    Family Pre 30 %    GLOBAL Pre 25.64 %          Scores of 19 and below usually indicate a poorer quality of life in these areas.  A difference of  2-3 points is a clinically meaningful difference.  A difference of 2-3 points in the total score of the Quality of Life Index has been associated with significant improvement in overall quality of life, self-image, physical symptoms, and general health in studies assessing change in quality of life.   PHQ-9: Recent Review Flowsheet Data    Depression screen [facility] 2/9 10/14/2020 03/11/2019   Decreased Interest 3 0   Down, Depressed, Hopeless 3 0   PHQ - 2 Score 6 0   Altered sleeping 3 -   Tired, decreased energy 3 -   Change in appetite 2 -   Feeling bad or failure about yourself  2 -   Trouble concentrating 1 -    Moving slowly or fidgety/restless 0 -   Suicidal thoughts 0 -   PHQ-9 Score 17 -   Difficult doing work/chores Very difficult -     Interpretation of Total Score  Total Score Depression Severity:  1-4 = Minimal depression, 5-9 = Mild depression, 10-14 = Moderate depression, 15-19 = Moderately severe depression, 20-27 = Severe depression   Psychosocial Evaluation and Intervention:  Psychosocial Evaluation - 10/14/20 1558      Psychosocial Evaluation & Interventions   Interventions Stress management education;Encouraged to exercise with the program and follow exercise prescription    Comments Pt has no barriers to completing pulmonary rehab. The only psychosocial issue that she has is depression. She has been depressed since her husband died in 2019-02-02. She is prescribed Zoloft. She reports that she thinks that it helps. Her PHQ-9 was a 17. A lot of this is related to the death of her husband. She also lives in a nursing home. She would like to move back home, but she understands that this will likely  never happen due to her frequent falls and not having a caretaker available throughout the day. She states that she copes well and has a strong support system with her daughter, son in law, grand daughter, and her cousin. Her goals in the program are to decrease her shortness of breath and to be able to walk better. Her legs give out a lot causing her to fall. She is hopeful that by coming to rehab, her legs will become stronger, and she will be able to walk better and fall less. She is excited to start the program and will likely benefit from the social aspect of the group class as well.    Expected Outcomes Her PHQ-9 score will decrease.    Continue Psychosocial Services  Follow up required by staff           Psychosocial Re-Evaluation:   Psychosocial Discharge (Final Psychosocial Re-Evaluation):    Education: Education Goals: Education classes will be provided on a weekly  basis, covering required topics. Participant will state understanding/return demonstration of topics presented.  Learning Barriers/Preferences:  Learning Barriers/Preferences - 10/14/20 1459      Learning Barriers/Preferences   Learning Barriers Hearing   HOH and wears hearing aides   Learning Preferences Written Material;Skilled Demonstration;Individual Instruction           Education Topics: How Lungs Work and Diseases: - Discuss the anatomy of the lungs and diseases that can affect the lungs, such as COPD.   Exercise: -Discuss the importance of exercise, FITT principles of exercise, normal and abnormal responses to exercise, and how to exercise safely.   Environmental Irritants: -Discuss types of environmental irritants and how to limit exposure to environmental irritants.   Meds/Inhalers and oxygen: - Discuss respiratory medications, definition of an inhaler and oxygen, and the proper way to use an inhaler and oxygen.   Energy Saving Techniques: - Discuss methods to conserve energy and decrease shortness of breath when performing activities of daily living.    Bronchial Hygiene / Breathing Techniques: - Discuss breathing mechanics, pursed-lip breathing technique,  proper posture, effective ways to clear airways, and other functional breathing techniques   Cleaning Equipment: - Provides group verbal and written instruction about the health risks of elevated stress, cause of high stress, and healthy ways to reduce stress.   Nutrition I: Fats: - Discuss the types of cholesterol, what cholesterol does to the body, and how cholesterol levels can be controlled.   Nutrition II: Labels: -Discuss the different components of food labels and how to read food labels.   Respiratory Infections: - Discuss the signs and symptoms of respiratory infections, ways to prevent respiratory infections, and the importance of seeking medical treatment when having a respiratory  infection.   Stress I: Signs and Symptoms: - Discuss the causes of stress, how stress may lead to anxiety and depression, and ways to limit stress.   Stress II: Relaxation: -Discuss relaxation techniques to limit stress.   Oxygen for Home/Travel: - Discuss how to prepare for travel when on oxygen and proper ways to transport and store oxygen to ensure safety.   Knowledge Questionnaire Score:  Knowledge Questionnaire Score - 10/14/20 1500      Knowledge Questionnaire Score   Pre Score 0/18           Core Components/Risk Factors/Patient Goals at Admission:  Personal Goals and Risk Factors at Admission - 10/14/20 1504      Core Components/Risk Factors/Patient Goals on Admission    Weight Management Yes;Weight Maintenance  Intervention Weight Management: Develop a combined nutrition and exercise program designed to reach desired caloric intake, while maintaining appropriate intake of nutrient and fiber, sodium and fats, and appropriate energy expenditure required for the weight goal.;Weight Management: Provide education and appropriate resources to help participant work on and attain dietary goals.    Expected Outcomes Short Term: Continue to assess and modify interventions until short term weight is achieved;Long Term: Adherence to nutrition and physical activity/exercise program aimed toward attainment of established weight goal;Weight Maintenance: Understanding of the daily nutrition guidelines, which includes 25-35% calories from fat, 7% or less cal from saturated fats, less than 200mg  cholesterol, less than 1.5gm of sodium, & 5 or more servings of fruits and vegetables daily    Improve shortness of breath with ADL's Yes    Intervention Provide education, individualized exercise plan and daily activity instruction to help decrease symptoms of SOB with activities of daily living.    Expected Outcomes Short Term: Improve cardiorespiratory fitness to achieve a reduction of symptoms  when performing ADLs;Long Term: Be able to perform more ADLs without symptoms or delay the onset of symptoms    Diabetes Yes    Intervention Provide education about signs/symptoms and action to take for hypo/hyperglycemia.;Provide education about proper nutrition, including hydration, and aerobic/resistive exercise prescription along with prescribed medications to achieve blood glucose in normal ranges: Fasting glucose 65-99 mg/dL    Expected Outcomes Short Term: Participant verbalizes understanding of the signs/symptoms and immediate care of hyper/hypoglycemia, proper foot care and importance of medication, aerobic/resistive exercise and nutrition plan for blood glucose control.;Long Term: Attainment of HbA1C < 7%.    Hypertension Yes    Intervention Provide education on lifestyle modifcations including regular physical activity/exercise, weight management, moderate sodium restriction and increased consumption of fresh fruit, vegetables, and low fat dairy, alcohol moderation, and smoking cessation.;Monitor prescription use compliance.    Expected Outcomes Short Term: Continued assessment and intervention until BP is < 140/26mm HG in hypertensive participants. < 130/65mm HG in hypertensive participants with diabetes, heart failure or chronic kidney disease.;Long Term: Maintenance of blood pressure at goal levels.    Lipids Yes    Intervention Provide education and support for participant on nutrition & aerobic/resistive exercise along with prescribed medications to achieve LDL 70mg , HDL >40mg .    Expected Outcomes Short Term: Participant states understanding of desired cholesterol values and is compliant with medications prescribed. Participant is following exercise prescription and nutrition guidelines.;Long Term: Cholesterol controlled with medications as prescribed, with individualized exercise RX and with personalized nutrition plan. Value goals: LDL < 70mg , HDL > 40 mg.    Personal Goal Other Yes     Personal Goal Improve her walking    Intervention Attend pulmonary rehab and do exercises to strengthen her legs.    Expected Outcomes Her legs will become stronger and she will be able to walk longer distances.           Core Components/Risk Factors/Patient Goals Review:    Core Components/Risk Factors/Patient Goals at Discharge (Final Review):    ITP Comments:   Comments: Patient arrived for 1st visit/orientation/education at 1430. Patient was referred to PR by Dr. [name] due to Shortness of breath (R06.02), decreased diffusion capacity (R94.2), and chronic cough (R05.3). During orientation advised patient on arrival and appointment times what to wear, what to do before, during and after exercise. Reviewed attendance and class policy.  Pt is scheduled to return Pulmonary Rehab on 10/20/2020 at 1500. Pt was advised to come to class  15 minutes before class starts.  Discussed RPE/Dpysnea scales. Patient participated in warm up stretches. Patient was able to complete 6 minute walk test. Patient was measured for the equipment. Discussed equipment safety with patient. Took patient pre-anthropometric measurements. Patient finished visit at 1545.

## 2020-10-15 ENCOUNTER — Encounter (HOSPITAL_COMMUNITY): Payer: Medicare Other

## 2020-10-19 ENCOUNTER — Other Ambulatory Visit: Payer: Self-pay

## 2020-10-19 ENCOUNTER — Encounter (HOSPITAL_COMMUNITY)
Admission: RE | Admit: 2020-10-19 | Discharge: 2020-10-19 | Disposition: A | Discharge status: Home / Self Care | Payer: Medicare Other | Source: Ambulatory Visit | Attending: Pulmonary Disease | Admitting: Pulmonary Disease

## 2020-10-19 DIAGNOSIS — R942 Abnormal results of pulmonary function studies: Secondary | ICD-10-CM | POA: Diagnosis present

## 2020-10-19 DIAGNOSIS — R0602 Shortness of breath: Secondary | ICD-10-CM | POA: Diagnosis not present

## 2020-10-19 DIAGNOSIS — R053 Chronic cough: Secondary | ICD-10-CM | POA: Diagnosis present

## 2020-10-19 NOTE — Progress Notes (Signed)
Daily Session Note  Patient Details  Name: Angela Galvan MRN: [id number] Date of Birth: 03-17-1933 Referring Provider:   [facility] from 10/14/2020 in [place]  Referring Provider Dr. [name]      Encounter Date: 10/19/2020  Check In:  Session Check In - 10/19/20 1500      Check-In   Supervising physician immediately available to respond to emergencies [facility] MD immediately available    Physician(s) Dr.. Domenic Polite    Location [facility]    Staff Present Geanie Cooley, RN;Madison Audria Nine, MS, Exercise Physiologist    Virtual Visit No    Medication changes reported     No    Fall or balance concerns reported    Yes    Comments Has fallen several times over the past few months.    Tobacco Cessation No Change    Warm-up and Cool-down Performed as group-led instruction    Resistance Training Performed Yes    VAD Patient? No    PAD/SET Patient? No      Pain Assessment   Currently in Pain? No/denies    Multiple Pain Sites No           Capillary Blood Glucose: No results found for this or any previous visit (from the past 24 hour(s)).    Social History   Tobacco Use  Smoking Status Never Smoker  Smokeless Tobacco Never Used  Tobacco Comment   Never smoked    Goals Met:  Proper associated with RPD/PD & O2 Sat Independence with exercise equipment Using PLB without cueing & demonstrates good technique Exercise tolerated well No report of cardiac concerns or symptoms Strength training completed today  Goals Unmet:  Not Applicable  Comments: check out @ 4:00pm   Dr. Kathie Dike is Medical Director for [facility] Pulmonary Rehab.

## 2020-10-21 ENCOUNTER — Ambulatory Visit (HOSPITAL_COMMUNITY)
Admission: RE | Admit: 2020-10-21 | Discharge: 2020-10-21 | Disposition: A | Discharge status: Home / Self Care | Payer: Medicare Other | Source: Ambulatory Visit | Attending: Pulmonary Disease | Admitting: Pulmonary Disease

## 2020-10-21 ENCOUNTER — Encounter (HOSPITAL_COMMUNITY): Payer: Medicare Other

## 2020-10-21 ENCOUNTER — Other Ambulatory Visit: Payer: Self-pay

## 2020-10-21 DIAGNOSIS — E119 Type 2 diabetes mellitus without complications: Secondary | ICD-10-CM | POA: Insufficient documentation

## 2020-10-21 DIAGNOSIS — K219 Gastro-esophageal reflux disease without esophagitis: Secondary | ICD-10-CM | POA: Insufficient documentation

## 2020-10-21 DIAGNOSIS — J984 Other disorders of lung: Secondary | ICD-10-CM | POA: Insufficient documentation

## 2020-10-21 DIAGNOSIS — I351 Nonrheumatic aortic (valve) insufficiency: Secondary | ICD-10-CM

## 2020-10-21 DIAGNOSIS — E785 Hyperlipidemia, unspecified: Secondary | ICD-10-CM | POA: Insufficient documentation

## 2020-10-21 LAB — ECHOCARDIOGRAM COMPLETE
Area-P 1/2: 2.95 cm2
MV M vel: 4.21 m/s
MV Peak grad: 70.9 mmHg
P 1/2 time: 425 msec
S' Lateral: 2.34 cm

## 2020-10-21 NOTE — Progress Notes (Signed)
*  PRELIMINARY RESULTS* Echocardiogram 2D Echocardiogram has been performed.  Leavy Cella 10/21/2020, 11:06 AM

## 2020-10-22 ENCOUNTER — Other Ambulatory Visit (HOSPITAL_COMMUNITY): Payer: Medicare Other

## 2020-10-26 ENCOUNTER — Encounter (HOSPITAL_COMMUNITY)
Admission: RE | Admit: 2020-10-26 | Discharge: 2020-10-26 | Disposition: A | Discharge status: Home / Self Care | Payer: Medicare Other | Source: Ambulatory Visit | Attending: Pulmonary Disease | Admitting: Pulmonary Disease

## 2020-10-26 ENCOUNTER — Other Ambulatory Visit: Payer: Self-pay

## 2020-10-26 VITALS — Wt 125.7 lb

## 2020-10-26 DIAGNOSIS — R0602 Shortness of breath: Secondary | ICD-10-CM | POA: Diagnosis not present

## 2020-10-26 NOTE — Progress Notes (Signed)
Daily Session Note  Patient Details  Name: Angela Galvan MRN: [id number] Date of Birth: 1933-04-06 Referring Provider:   Flowsheet Row PULMONARY REHAB OTHER RESP ORIENTATION from 10/14/2020 in [place]  Referring Provider Dr. [name]      Encounter Date: 10/26/2020  Check In:  Session Check In - 10/26/20 1500      Check-In   Supervising physician immediately available to respond to emergencies [facility] MD immediately available    Physician(s) Dr. Harl Bowie    Location [facility]    Staff Present Geanie Cooley, RN;Madison Audria Nine, MS, Exercise Physiologist;Dalton Kris Mouton, MS, ACSM-CEP, Exercise Physiologist    Virtual Visit No    Medication changes reported     No    Fall or balance concerns reported    No    Tobacco Cessation No Change    Warm-up and Cool-down Performed as group-led instruction    Resistance Training Performed Yes    VAD Patient? No    PAD/SET Patient? No      Pain Assessment   Currently in Pain? No/denies    Multiple Pain Sites No           Capillary Blood Glucose: No results found for this or any previous visit (from the past 24 hour(s)).    Social History   Tobacco Use  Smoking Status Never Smoker  Smokeless Tobacco Never Used  Tobacco Comment   Never smoked    Goals Met:  Independence with exercise equipment Using PLB without cueing & demonstrates good technique Exercise tolerated well No report of cardiac concerns or symptoms Strength training completed today  Goals Unmet:  Not Applicable  Comments: check out @ 4:00pm    Dr. Kathie Dike is Medical Director for [facility] Pulmonary Rehab.

## 2020-10-28 ENCOUNTER — Other Ambulatory Visit: Payer: Self-pay

## 2020-10-28 ENCOUNTER — Encounter (HOSPITAL_COMMUNITY)
Admission: RE | Admit: 2020-10-28 | Discharge: 2020-10-28 | Disposition: A | Discharge status: Home / Self Care | Payer: Medicare Other | Source: Ambulatory Visit | Attending: Pulmonary Disease | Admitting: Pulmonary Disease

## 2020-10-28 DIAGNOSIS — R053 Chronic cough: Secondary | ICD-10-CM | POA: Diagnosis present

## 2020-10-28 DIAGNOSIS — R942 Abnormal results of pulmonary function studies: Secondary | ICD-10-CM | POA: Insufficient documentation

## 2020-10-28 DIAGNOSIS — R0602 Shortness of breath: Secondary | ICD-10-CM

## 2020-10-28 NOTE — Progress Notes (Signed)
Daily Session Note  Patient Details  Name: Angela Galvan MRN: [id number] Date of Birth: September 03, 1932 Referring Provider:   Flowsheet Row PULMONARY REHAB OTHER RESP ORIENTATION from 10/14/2020 in [place]  Referring Provider Dr. [name]      Encounter Date: 10/28/2020  Check In:  Session Check In - 10/28/20 1500      Check-In   Supervising physician immediately available to respond to emergencies [facility] MD immediately available    Physician(s) Dr. Harl Bowie    Location [facility]    Staff Present Geanie Cooley, RN;Dalton Kris Mouton, MS, ACSM-CEP, Exercise Physiologist    Virtual Visit No    Medication changes reported     No    Fall or balance concerns reported    Yes    Comments Has fallen several times over the past few months. Uses the rollator for balance.    Tobacco Cessation No Change    Warm-up and Cool-down Performed as group-led instruction    Resistance Training Performed Yes    VAD Patient? No    PAD/SET Patient? No      Pain Assessment   Currently in Pain? No/denies    Multiple Pain Sites No           Capillary Blood Glucose: No results found for this or any previous visit (from the past 24 hour(s)).    Social History   Tobacco Use  Smoking Status Never Smoker  Smokeless Tobacco Never Used  Tobacco Comment   Never smoked    Goals Met:  Independence with exercise equipment Using PLB without cueing & demonstrates good technique Exercise tolerated well No report of cardiac concerns or symptoms Strength training completed today  Goals Unmet:  Not Applicable  Comments: check out @ 4:00PM   Dr. Kathie Dike is Medical Director for [facility] Pulmonary Rehab.

## 2020-10-28 NOTE — Progress Notes (Signed)
Pulmonary Individual Treatment Plan  Patient Details  Name: Angela Galvan MRN: [id number] Date of Birth: July 15, 1932 Referring Provider:   [place] from 10/14/2020 in [place]  Referring Provider Dr. [name]      Initial Encounter Date:  Flowsheet Row PULMONARY REHAB OTHER RESP ORIENTATION from 10/14/2020 in [place]  Date 10/14/20      Visit Diagnosis: Shortness of breath  Decreased diffusion capacity  Chronic cough  Patient's Home Medications on Admission:   Current Outpatient Medications:  .  acetaminophen (TYLENOL) 500 MG tablet, Take 500-1,000 mg by mouth every 6 (six) hours as needed for mild pain or moderate pain. Pain , Disp: , Rfl:  .  albuterol (VENTOLIN HFA) 108 (90 Base) MCG/ACT inhaler, INHALE 2 PUFFS EVERY 6 HOURS AS NEEDED FOR WHEEZING OR SHORTNESS OF BREATH (Patient taking differently: Inhale 2 puffs into the lungs every 6 (six) hours as needed for wheezing or shortness of breath.), Disp: 8.5 g, Rfl: 3 .  ALPRAZolam (XANAX) 0.5 MG tablet, Take 0.5 mg by mouth 2 (two) times daily as needed for sleep or anxiety., Disp: , Rfl:  .  aspirin 325 MG tablet, Take 325 mg by mouth daily., Disp: , Rfl:  .  aspirin EC 81 MG tablet, Take 81 mg by mouth daily. Swallow whole., Disp: , Rfl:  .  atorvastatin (LIPITOR) 10 MG tablet, Take 1 tablet (10 mg total) by mouth daily after breakfast., Disp: 90 tablet, Rfl: 0 .  BREO ELLIPTA 200-25 MCG/INH AEPB, Inhale 1 puff into the lungs daily. (Patient not taking: Reported on 09/21/2020), Disp: , Rfl:  .  clidinium-chlordiazePOXIDE (LIBRAX) 5-2.5 MG capsule, Use once daily in the morning. Further doses as needed up to 4 a day (Patient taking differently: Take 1 capsule by mouth daily as needed (constipation).), Disp: 40 capsule, Rfl: 5 .  FLUoxetine (PROZAC) 20 MG capsule, TAKE ONE CAPSULE BY MOUTH ONCE DAILY. (Patient taking differently: Take 20 mg by  mouth every morning.), Disp: 30 capsule, Rfl: 0 .  fluticasone (FLONASE) 50 MCG/ACT nasal spray, Place 1 spray into both nostrils daily. (Patient taking differently: Place 1 spray into both nostrils daily as needed for allergies or rhinitis.), Disp: 16 mL, Rfl: 12 .  gabapentin (NEURONTIN) 100 MG capsule, Take by mouth., Disp: , Rfl:  .  gabapentin (NEURONTIN) 300 MG capsule, TAKE 1 CAPSULE BY MOUTH 3 TIMES A DAY AS NEEDED (Patient taking differently: Take 300 mg by mouth at bedtime.), Disp: 90 capsule, Rfl: 3 .  levothyroxine (SYNTHROID) 75 MCG tablet, Take 1 tablet (75 mcg total) by mouth daily. (Patient taking differently: Take 75 mcg by mouth daily before breakfast.), Disp: 90 tablet, Rfl: 3 .  levothyroxine (SYNTHROID) 88 MCG tablet, Take 88 mcg by mouth daily before breakfast., Disp: , Rfl:  .  loperamide (IMODIUM) 2 MG capsule, Take 2 mg by mouth as needed for diarrhea or loose stools., Disp: , Rfl:  .  loratadine (CLARITIN) 10 MG tablet, Take 10 mg by mouth daily., Disp: , Rfl:  .  losartan (COZAAR) 100 MG tablet, Take 1 tablet (100 mg total) by mouth daily., Disp: 90 tablet, Rfl: 0 .  meloxicam (MOBIC) 15 MG tablet, Take 1 tablet by mouth daily., Disp: , Rfl:  .  RABEprazole (ACIPHEX) 20 MG tablet, TAKE 1 TABLET BY MOUTH 2 TIMES DAILY (Patient taking differently: Take 20 mg by mouth in the morning and at bedtime.), Disp: 180 tablet, Rfl: 2 .  RESTASIS 0.05 % ophthalmic emulsion, Place 1 drop into both eyes 2 (two) times daily. , Disp: , Rfl:  .  sertraline (ZOLOFT) 100 MG tablet, Take 100 mg by mouth daily., Disp: , Rfl:  .  TRELEGY ELLIPTA 100-62.5-25 MCG/INH AEPB, Inhale 1 puff into the lungs daily., Disp: , Rfl:   Past Medical History: Past Medical History:  Diagnosis Date  . Anxiety   . Arthritis   . Asthma   . Cancer [facility])    breast cancer left with mastectomy  . Cataract   . 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 Depression   . Diabetes mellitus    no meds.-diet controlled  . Diarrhea   . Diverticulitis    s/p colectomy in 1991  . GERD (gastroesophageal reflux disease)   . HOH (hard of hearing)   . HTN (hypertension)    Does not see a cardiologist  . Hyperlipidemia   . Hypothyroidism   . IBS (irritable bowel syndrome)   . S/P colonoscopy 2003   minimal internal hemorrhoids, pancolonic diverticula, biopsy of rectum: lymphoplasmacytic microscopic colitis, colon biopsies normal  . S/P colonoscopy 2012   pancolonic diverticula, nl TI, external hemorrhoidal tag,/rectum bx no abnormalities  . S/P endoscopy 2012   normal esophagus, stenotic pyloric channel, TTS dilation  . Stroke ([place]) 08/10/2006   no residual    Tobacco Use: Social History   Tobacco Use  Smoking Status Never Smoker  Smokeless Tobacco Never Used  Tobacco Comment   Never smoked    Labs: Recent Review Flowsheet Data    Labs for ITP Cardiac and Pulmonary Rehab Latest Ref Rng & Units 11/04/2016 09/12/2017 01/29/2018 11/08/2018 03/11/2019   Cholestrol 0 - 200 - - 142 - -   LDLCALC - - - 44 - -   HDL 35 - 70 - - 70 - -   Trlycerides 40 - 160 - - 141 - -   Hemoglobin A1c 4.0 - 5.6 % 5.5 6.0 5.9 5.9 6.1(A)      Capillary Blood Glucose: Lab Results  Component Value Date   GLUCAP 175 (H) 12/02/2019   GLUCAP 108 (H) 11/04/2016   GLUCAP 132 (H) 11/03/2016   GLUCAP 107 (H) 11/03/2016   GLUCAP 117 (H) 01/29/2015     Pulmonary Assessment Scores:  Pulmonary Assessment Scores    Row Name 10/14/20 1448         ADL UCSD   SOB Score total 50           CAT Score   CAT Score 21           mMRC Score   mMRC Score 3           UCSD: Self-administered rating of dyspnea associated with activities of daily living (ADLs) 6-point scale (0 = "not at all" to 5 = "maximal or unable to do because of breathlessness")  Scoring Scores range from 0 to 120.  Minimally important difference is 5  units  CAT: CAT can identify the health impairment of COPD patients and is better correlated with disease progression.  CAT has a scoring range of zero to 40. The CAT score is classified into four groups of low (less than 10), medium (10 - 20), high (21-30) and very high (31-40) based on the impact level of disease on health status. A CAT score over 10 suggests significant symptoms.  A worsening CAT  score could be explained by an exacerbation, poor medication adherence, poor inhaler technique, or progression of COPD or comorbid conditions.  CAT MCID is 2 points  mMRC: mMRC (Modified Medical Research Council) Dyspnea Scale is used to assess the degree of baseline functional disability in patients of respiratory disease due to dyspnea. No minimal important difference is established. A decrease in score of 1 point or greater is considered a positive change.   Pulmonary Function Assessment:   Exercise Target Goals: Exercise Program Goal: Individual exercise prescription set using results from initial 6 min walk test and THRR while considering  patient's activity barriers and safety.   Exercise Prescription Goal: Initial exercise prescription builds to 30-45 minutes a day of aerobic activity, 2-3 days per week.  Home exercise guidelines will be given to patient during program as part of exercise prescription that the participant will acknowledge.  Activity Barriers & Risk Stratification:  Activity Barriers & Cardiac Risk Stratification - 10/14/20 1451      Activity Barriers & Cardiac Risk Stratification   Activity Barriers Arthritis;Left Knee Replacement;Right Knee Replacement;Back Problems;Neck/Spine Problems;Joint Problems;Deconditioning;Shortness of Breath;Balance Concerns;History of Falls;Assistive Device    Cardiac Risk Stratification Moderate           6 Minute Walk:  6 Minute Walk    Row Name 10/14/20 1604         6 Minute Walk   Phase Initial     Distance 800 feet     Walk  Time 6 minutes     # of Rest Breaks 1     MPH 1.52     METS 1.13     RPE 13     Perceived Dyspnea  15     VO2 Peak 3.96     Symptoms Yes (comment)     Comments 1 seated rest break due to shortness of breath     Resting HR 76 bpm     Resting BP 122/56     Resting Oxygen Saturation  95 %     Exercise Oxygen Saturation  during 6 min walk 93 %     Max Ex. HR 102 bpm     Max Ex. BP 124/52     2 Minute Post BP 116/60           Interval HR   1 Minute HR 94     2 Minute HR 100     3 Minute HR 102     4 Minute HR 100     5 Minute HR 101     6 Minute HR 102     2 Minute Post HR 83     Interval Heart Rate? Yes           Interval Oxygen   Interval Oxygen? Yes     Baseline Oxygen Saturation % 95 %     1 Minute Oxygen Saturation % 93 %     1 Minute Liters of Oxygen 0 L     2 Minute Oxygen Saturation % 94 %     2 Minute Liters of Oxygen 0 L     3 Minute Oxygen Saturation % 95 %     3 Minute Liters of Oxygen 0 L     4 Minute Oxygen Saturation % 95 %     4 Minute Liters of Oxygen 0 L     5 Minute Oxygen Saturation % 95 %     5 Minute Liters of Oxygen 0 L  6 Minute Oxygen Saturation % 96 %     6 Minute Liters of Oxygen 0 L     2 Minute Post Oxygen Saturation % 96 %     2 Minute Post Liters of Oxygen 0 L            Oxygen Initial Assessment:  Oxygen Initial Assessment - 10/14/20 1609      Initial 6 min Walk   Oxygen Used None      Program Oxygen Prescription   Program Oxygen Prescription None      Intervention   Short Term Goals To learn and exhibit compliance with exercise, home and travel O2 prescription;To learn and understand importance of monitoring SPO2 with pulse oximeter and demonstrate accurate use of the pulse oximeter.;To learn and understand importance of maintaining oxygen saturations>88%;To learn and demonstrate proper pursed lip breathing techniques or other breathing techniques.;To learn and demonstrate proper use of respiratory medications    Long  Term  Goals Exhibits compliance with exercise, home and travel O2 prescription;Verbalizes importance of monitoring SPO2 with pulse oximeter and return demonstration;Maintenance of O2 saturations>88%;Exhibits proper breathing techniques, such as pursed lip breathing or other method taught during program session;Compliance with respiratory medication           Oxygen Re-Evaluation:  Oxygen Re-Evaluation    Row Name 10/27/20 0754             Program Oxygen Prescription   Program Oxygen Prescription None               Home Oxygen   Home Oxygen Device None       Sleep Oxygen Prescription None       Home Exercise Oxygen Prescription None       Home Resting Oxygen Prescription None       Compliance with Home Oxygen Use Yes               Goals/Expected Outcomes   Short Term Goals To learn and exhibit compliance with exercise, home and travel O2 prescription;To learn and understand importance of monitoring SPO2 with pulse oximeter and demonstrate accurate use of the pulse oximeter.;To learn and understand importance of maintaining oxygen saturations>88%;To learn and demonstrate proper pursed lip breathing techniques or other breathing techniques.;To learn and demonstrate proper use of respiratory medications       Long  Term Goals Exhibits compliance with exercise, home and travel O2 prescription;Verbalizes importance of monitoring SPO2 with pulse oximeter and return demonstration;Maintenance of O2 saturations>88%;Exhibits proper breathing techniques, such as pursed lip breathing or other method taught during program session;Compliance with respiratory medication       Goals/Expected Outcomes Compliance              Oxygen Discharge (Final Oxygen Re-Evaluation):  Oxygen Re-Evaluation - 10/27/20 0754      Program Oxygen Prescription   Program Oxygen Prescription None      Home Oxygen   Home Oxygen Device None    Sleep Oxygen Prescription None    Home Exercise Oxygen Prescription None     Home Resting Oxygen Prescription None    Compliance with Home Oxygen Use Yes      Goals/Expected Outcomes   Short Term Goals To learn and exhibit compliance with exercise, home and travel O2 prescription;To learn and understand importance of monitoring SPO2 with pulse oximeter and demonstrate accurate use of the pulse oximeter.;To learn and understand importance of maintaining oxygen saturations>88%;To learn and demonstrate proper pursed lip breathing techniques or other breathing  techniques.;To learn and demonstrate proper use of respiratory medications    Long  Term Goals Exhibits compliance with exercise, home and travel O2 prescription;Verbalizes importance of monitoring SPO2 with pulse oximeter and return demonstration;Maintenance of O2 saturations>88%;Exhibits proper breathing techniques, such as pursed lip breathing or other method taught during program session;Compliance with respiratory medication    Goals/Expected Outcomes Compliance           Initial Exercise Prescription:  Initial Exercise Prescription - 10/14/20 1600      Date of Initial Exercise RX and Referring Provider   Date 10/14/20    Referring Provider Dr. [name]    Expected Discharge Date 02/17/21      NuStep   Level 1    SPM 60    Minutes 39      Prescription Details   Frequency (times per week) 2    Duration Progress to 30 minutes of continuous aerobic without signs/symptoms of physical distress      Intensity   THRR 40-80% of Max Heartrate 54-107    Ratings of Perceived Exertion 11-13    Perceived Dyspnea 0-4      Resistance Training   Training Prescription Yes    Weight 1 lbs    Reps 10-15           Perform Capillary Blood Glucose checks as needed.  Exercise Prescription Changes:   Exercise Prescription Changes    Row Name 10/26/20 0749             Response to Exercise   Blood Pressure (Admit) 130/58       Blood Pressure (Exercise) 140/62       Blood Pressure (Exit) 130/62        Heart Rate (Admit) 89 bpm       Heart Rate (Exercise) 95 bpm       Heart Rate (Exit) 83 bpm       Oxygen Saturation (Admit) 96 %       Oxygen Saturation (Exercise) 95 %       Oxygen Saturation (Exit) 94 %       Rating of Perceived Exertion (Exercise) 13       Perceived Dyspnea (Exercise) 13       Duration Continue with 30 min of aerobic exercise without signs/symptoms of physical distress.       Intensity THRR unchanged               Progression   Progression Continue to progress workloads to maintain intensity without signs/symptoms of physical distress.               Resistance Training   Training Prescription Yes       Weight 1 lbs       Reps 10-15       Time 10 Minutes               NuStep   Level 1       SPM 57       Minutes 39       METs 1.8              Exercise Comments:   Exercise Comments    Row Name 10/19/20 1600           Exercise Comments Patient completed first exercise session. She tolerated exercise well. She complained of leg pain about halfway through. This is probably due to fatigue from deconditioning. She took frequent breaks and is hopeful it will get better. She  wants to decrease her shortness of breath in this program. She is very nice and she looks forward to coming back to rehab.              Exercise Goals and Review:   Exercise Goals    Row Name 10/14/20 1608 10/27/20 0750           Exercise Goals   Increase Physical Activity Yes Yes      Intervention Provide advice, education, support and counseling about physical activity/exercise needs.;Develop an individualized exercise prescription for aerobic and resistive training based on initial evaluation findings, risk stratification, comorbidities and participant's personal goals. Provide advice, education, support and counseling about physical activity/exercise needs.;Develop an individualized exercise prescription for aerobic and resistive training based on initial evaluation findings,  risk stratification, comorbidities and participant's personal goals.      Expected Outcomes Short Term: Attend rehab on a regular basis to increase amount of physical activity.;Long Term: Add in home exercise to make exercise part of routine and to increase amount of physical activity.;Long Term: Exercising regularly at least 3-5 days a week. Short Term: Attend rehab on a regular basis to increase amount of physical activity.;Long Term: Add in home exercise to make exercise part of routine and to increase amount of physical activity.;Long Term: Exercising regularly at least 3-5 days a week.      Increase Strength and Stamina Yes Yes      Intervention Provide advice, education, support and counseling about physical activity/exercise needs.;Develop an individualized exercise prescription for aerobic and resistive training based on initial evaluation findings, risk stratification, comorbidities and participant's personal goals. Provide advice, education, support and counseling about physical activity/exercise needs.;Develop an individualized exercise prescription for aerobic and resistive training based on initial evaluation findings, risk stratification, comorbidities and participant's personal goals.      Expected Outcomes Short Term: Increase workloads from initial exercise prescription for resistance, speed, and METs.;Short Term: Perform resistance training exercises routinely during rehab and add in resistance training at home;Long Term: Improve cardiorespiratory fitness, muscular endurance and strength as measured by increased METs and functional capacity (6MWT) Short Term: Increase workloads from initial exercise prescription for resistance, speed, and METs.;Short Term: Perform resistance training exercises routinely during rehab and add in resistance training at home;Long Term: Improve cardiorespiratory fitness, muscular endurance and strength as measured by increased METs and functional capacity (6MWT)       Able to understand and use rate of perceived exertion (RPE) scale Yes Yes      Intervention Provide education and explanation on how to use RPE scale Provide education and explanation on how to use RPE scale      Expected Outcomes Short Term: Able to use RPE daily in rehab to express subjective intensity level;Long Term:  Able to use RPE to guide intensity level when exercising independently Short Term: Able to use RPE daily in rehab to express subjective intensity level;Long Term:  Able to use RPE to guide intensity level when exercising independently      Able to understand and use Dyspnea scale Yes Yes      Intervention Provide education and explanation on how to use Dyspnea scale Provide education and explanation on how to use Dyspnea scale      Expected Outcomes Short Term: Able to use Dyspnea scale daily in rehab to express subjective sense of shortness of breath during exertion;Long Term: Able to use Dyspnea scale to guide intensity level when exercising independently Short Term: Able to use Dyspnea scale daily  in rehab to express subjective sense of shortness of breath during exertion;Long Term: Able to use Dyspnea scale to guide intensity level when exercising independently      Knowledge and understanding of Target Heart Rate Range (THRR) Yes Yes      Intervention Provide education and explanation of THRR including how the numbers were predicted and where they are located for reference Provide education and explanation of THRR including how the numbers were predicted and where they are located for reference      Expected Outcomes Short Term: Able to state/look up THRR;Long Term: Able to use THRR to govern intensity when exercising independently;Short Term: Able to use daily as guideline for intensity in rehab Short Term: Able to state/look up THRR;Long Term: Able to use THRR to govern intensity when exercising independently;Short Term: Able to use daily as guideline for intensity in rehab       Understanding of Exercise Prescription Yes Yes      Intervention Provide education, explanation, and written materials on patient's individual exercise prescription Provide education, explanation, and written materials on patient's individual exercise prescription      Expected Outcomes Short Term: Able to explain program exercise prescription;Long Term: Able to explain home exercise prescription to exercise independently Short Term: Able to explain program exercise prescription;Long Term: Able to explain home exercise prescription to exercise independently             Exercise Goals Re-Evaluation :  Exercise Goals Re-Evaluation    Row Name 10/27/20 0751             Exercise Goal Re-Evaluation   Exercise Goals Review Increase Physical Activity;Increase Strength and Stamina;Able to understand and use rate of perceived exertion (RPE) scale;Able to understand and use Dyspnea scale;Knowledge and understanding of Target Heart Rate Range (THRR);Able to check pulse independently;Understanding of Exercise Prescription       Comments Patient has completed 2 execise sessions. She is very deconditioned. She gets very tired out by the warm up. She has complained of muscle soreness and fatigue during exercise as well as the next session. She seems a little discouraged, but I reassured her that it is a lot at first and that she would have to get used to it. She agreed. She is eager about working towards her goals. She is currenlty exercising at 1.8 METs on the NuStep. Will continue to monitor and progess as able.       Expected Outcomes Through rehab and exercise at home, patient will achieve their goals.              Discharge Exercise Prescription (Final Exercise Prescription Changes):  Exercise Prescription Changes - 10/26/20 0749      Response to Exercise   Blood Pressure (Admit) 130/58    Blood Pressure (Exercise) 140/62    Blood Pressure (Exit) 130/62    Heart Rate (Admit) 89 bpm    Heart  Rate (Exercise) 95 bpm    Heart Rate (Exit) 83 bpm    Oxygen Saturation (Admit) 96 %    Oxygen Saturation (Exercise) 95 %    Oxygen Saturation (Exit) 94 %    Rating of Perceived Exertion (Exercise) 13    Perceived Dyspnea (Exercise) 13    Duration Continue with 30 min of aerobic exercise without signs/symptoms of physical distress.    Intensity THRR unchanged      Progression   Progression Continue to progress workloads to maintain intensity without signs/symptoms of physical distress.  Resistance Training   Training Prescription Yes    Weight 1 lbs    Reps 10-15    Time 10 Minutes      NuStep   Level 1    SPM 57    Minutes 39    METs 1.8           Nutrition:  Target Goals: Understanding of nutrition guidelines, daily intake of sodium 1500mg , cholesterol 200mg , calories 30% from fat and 7% or less from saturated fats, daily to have 5 or more servings of fruits and vegetables.  Biometrics:  Pre Biometrics - 10/27/20 0751      Pre Biometrics   Weight 57 kg    BMI (Calculated) 24.54            Nutrition Therapy Plan and Nutrition Goals:  Nutrition Therapy & Goals - 10/15/20 0754      Personal Nutrition Goals   Comments Patient scored 43 on her diet assessement. She is in a long term care facility making it difficult to change her diet. We provide 2 educational sessions on heart healthy nutrition with handouts and offer assistance with RD referral.      Intervention Plan   Intervention Nutrition handout(s) given to patient.           Nutrition Assessments:  Nutrition Assessments - 10/14/20 1453      MEDFICTS Scores   Pre Score 43          MEDIFICTS Score Key:  ?70 Need to make dietary changes   40-70 Heart Healthy Diet  ? 40 Therapeutic Level Cholesterol Diet   Picture Your Plate Scores:  <20 Unhealthy dietary pattern with much room for improvement.  41-50 Dietary pattern unlikely to meet recommendations for good health and room for  improvement.  51-60 More healthful dietary pattern, with some room for improvement.   >60 Healthy dietary pattern, although there may be some specific behaviors that could be improved.    Nutrition Goals Re-Evaluation:   Nutrition Goals Discharge (Final Nutrition Goals Re-Evaluation):   Psychosocial: Target Goals: Acknowledge presence or absence of significant depression and/or stress, maximize coping skills, provide positive support system. Participant is able to verbalize types and ability to use techniques and skills needed for reducing stress and depression.  Initial Review & Psychosocial Screening:  Initial Psych Review & Screening - 10/14/20 1452      Initial Review   Current issues with Current Depression      Family Dynamics   Good Support System? Yes    Comments Her daughter, son in law, grand daughter, and cousin all support her.      Barriers   Psychosocial barriers to participate in program The patient should benefit from training in stress management and relaxation.      Screening Interventions   Interventions Encouraged to exercise    Expected Outcomes Long Term goal: The participant improves quality of Life and PHQ9 Scores as seen by post scores and/or verbalization of changes;Short Term goal: Identification and review with participant of any Quality of Life or Depression concerns found by scoring the questionnaire.           Quality of Life Scores:  Quality of Life - 10/14/20 1557      Quality of Life   Select Quality of Life      Quality of Life Scores   Health/Function Pre 20.4 %    Socioeconomic Pre 30 %    Psych/Spiritual Pre 30 %    Family Pre  30 %    GLOBAL Pre 25.64 %          Scores of 19 and below usually indicate a poorer quality of life in these areas.  A difference of  2-3 points is a clinically meaningful difference.  A difference of 2-3 points in the total score of the Quality of Life Index has been associated with significant  improvement in overall quality of life, self-image, physical symptoms, and general health in studies assessing change in quality of life.   PHQ-9: Recent Review Flowsheet Data    Depression screen [facility] 2/9 10/14/2020 03/11/2019   Decreased Interest 3 0   Down, Depressed, Hopeless 3 0   PHQ - 2 Score 6 0   Altered sleeping 3 -   Tired, decreased energy 3 -   Change in appetite 2 -   Feeling bad or failure about yourself  2 -   Trouble concentrating 1 -   Moving slowly or fidgety/restless 0 -   Suicidal thoughts 0 -   PHQ-9 Score 17 -   Difficult doing work/chores Very difficult -     Interpretation of Total Score  Total Score Depression Severity:  1-4 = Minimal depression, 5-9 = Mild depression, 10-14 = Moderate depression, 15-19 = Moderately severe depression, 20-27 = Severe depression   Psychosocial Evaluation and Intervention:  Psychosocial Evaluation - 10/14/20 1558      Psychosocial Evaluation & Interventions   Interventions Stress management education;Encouraged to exercise with the program and follow exercise prescription    Comments Pt has no barriers to completing pulmonary rehab. The only psychosocial issue that she has is depression. She has been depressed since her husband died in 02-18-19. She is prescribed Zoloft. She reports that she thinks that it helps. Her PHQ-9 was a 17. A lot of this is related to the death of her husband. She also lives in a nursing home. She would like to move back home, but she understands that this will likely never happen due to her frequent falls and not having a caretaker available throughout the day. She states that she copes well and has a strong support system with her daughter, son in law, grand daughter, and her cousin. Her goals in the program are to decrease her shortness of breath and to be able to walk better. Her legs give out a lot causing her to fall. She is hopeful that by coming to rehab, her legs will become stronger, and  she will be able to walk better and fall less. She is excited to start the program and will likely benefit from the social aspect of the group class as well.    Expected Outcomes Her PHQ-9 score will decrease.    Continue Psychosocial Services  Follow up required by staff           Psychosocial Re-Evaluation:  Psychosocial Re-Evaluation    [place] Name 10/20/20 1338             Psychosocial Re-Evaluation   Current issues with Current Depression       Comments Patient is new to the program completing 1 sessions. She continues on Zoloft for management of her depression. We will continue to monitor.       Expected Outcomes Patient's depression will continue to be managed with Zoloft and she will continue to have no additional psychosocial issues identified.       Interventions Stress management education;Relaxation education;Encouraged to attend Pulmonary Rehabilitation for the exercise  Continue Psychosocial Services  No Follow up required              Psychosocial Discharge (Final Psychosocial Re-Evaluation):  Psychosocial Re-Evaluation - 10/20/20 1338      Psychosocial Re-Evaluation   Current issues with Current Depression    Comments Patient is new to the program completing 1 sessions. She continues on Zoloft for management of her depression. We will continue to monitor.    Expected Outcomes Patient's depression will continue to be managed with Zoloft and she will continue to have no additional psychosocial issues identified.    Interventions Stress management education;Relaxation education;Encouraged to attend Pulmonary Rehabilitation for the exercise    Continue Psychosocial Services  No Follow up required            Education: Education Goals: Education classes will be provided on a weekly basis, covering required topics. Participant will state understanding/return demonstration of topics presented.  Learning Barriers/Preferences:  Learning Barriers/Preferences -  10/14/20 1459      Learning Barriers/Preferences   Learning Barriers Hearing   HOH and wears hearing aides   Learning Preferences Written Material;Skilled Demonstration;Individual Instruction           Education Topics: How Lungs Work and Diseases: - Discuss the anatomy of the lungs and diseases that can affect the lungs, such as COPD.   Exercise: -Discuss the importance of exercise, FITT principles of exercise, normal and abnormal responses to exercise, and how to exercise safely.   Environmental Irritants: -Discuss types of environmental irritants and how to limit exposure to environmental irritants.   Meds/Inhalers and oxygen: - Discuss respiratory medications, definition of an inhaler and oxygen, and the proper way to use an inhaler and oxygen.   Energy Saving Techniques: - Discuss methods to conserve energy and decrease shortness of breath when performing activities of daily living.    Bronchial Hygiene / Breathing Techniques: - Discuss breathing mechanics, pursed-lip breathing technique,  proper posture, effective ways to clear airways, and other functional breathing techniques   Cleaning Equipment: - Provides group verbal and written instruction about the health risks of elevated stress, cause of high stress, and healthy ways to reduce stress.   Nutrition I: Fats: - Discuss the types of cholesterol, what cholesterol does to the body, and how cholesterol levels can be controlled.   Nutrition II: Labels: -Discuss the different components of food labels and how to read food labels.   Respiratory Infections: - Discuss the signs and symptoms of respiratory infections, ways to prevent respiratory infections, and the importance of seeking medical treatment when having a respiratory infection.   Stress I: Signs and Symptoms: - Discuss the causes of stress, how stress may lead to anxiety and depression, and ways to limit stress.   Stress II: Relaxation: -Discuss  relaxation techniques to limit stress.   Oxygen for Home/Travel: - Discuss how to prepare for travel when on oxygen and proper ways to transport and store oxygen to ensure safety.   Knowledge Questionnaire Score:  Knowledge Questionnaire Score - 10/14/20 1500      Knowledge Questionnaire Score   Pre Score 0/18           Core Components/Risk Factors/Patient Goals at Admission:  Personal Goals and Risk Factors at Admission - 10/14/20 1504      Core Components/Risk Factors/Patient Goals on Admission    Weight Management Yes;Weight Maintenance    Intervention Weight Management: Develop a combined nutrition and exercise program designed to reach desired caloric intake, while maintaining appropriate  intake of nutrient and fiber, sodium and fats, and appropriate energy expenditure required for the weight goal.;Weight Management: Provide education and appropriate resources to help participant work on and attain dietary goals.    Expected Outcomes Short Term: Continue to assess and modify interventions until short term weight is achieved;Long Term: Adherence to nutrition and physical activity/exercise program aimed toward attainment of established weight goal;Weight Maintenance: Understanding of the daily nutrition guidelines, which includes 25-35% calories from fat, 7% or less cal from saturated fats, less than 200mg  cholesterol, less than 1.5gm of sodium, & 5 or more servings of fruits and vegetables daily    Improve shortness of breath with ADL's Yes    Intervention Provide education, individualized exercise plan and daily activity instruction to help decrease symptoms of SOB with activities of daily living.    Expected Outcomes Short Term: Improve cardiorespiratory fitness to achieve a reduction of symptoms when performing ADLs;Long Term: Be able to perform more ADLs without symptoms or delay the onset of symptoms    Diabetes Yes    Intervention Provide education about signs/symptoms and  action to take for hypo/hyperglycemia.;Provide education about proper nutrition, including hydration, and aerobic/resistive exercise prescription along with prescribed medications to achieve blood glucose in normal ranges: Fasting glucose 65-99 mg/dL    Expected Outcomes Short Term: Participant verbalizes understanding of the signs/symptoms and immediate care of hyper/hypoglycemia, proper foot care and importance of medication, aerobic/resistive exercise and nutrition plan for blood glucose control.;Long Term: Attainment of HbA1C < 7%.    Hypertension Yes    Intervention Provide education on lifestyle modifcations including regular physical activity/exercise, weight management, moderate sodium restriction and increased consumption of fresh fruit, vegetables, and low fat dairy, alcohol moderation, and smoking cessation.;Monitor prescription use compliance.    Expected Outcomes Short Term: Continued assessment and intervention until BP is < 140/69mm HG in hypertensive participants. < 130/58mm HG in hypertensive participants with diabetes, heart failure or chronic kidney disease.;Long Term: Maintenance of blood pressure at goal levels.    Lipids Yes    Intervention Provide education and support for participant on nutrition & aerobic/resistive exercise along with prescribed medications to achieve LDL 70mg , HDL >40mg .    Expected Outcomes Short Term: Participant states understanding of desired cholesterol values and is compliant with medications prescribed. Participant is following exercise prescription and nutrition guidelines.;Long Term: Cholesterol controlled with medications as prescribed, with individualized exercise RX and with personalized nutrition plan. Value goals: LDL < 70mg , HDL > 40 mg.    Personal Goal Other Yes    Personal Goal Improve her walking    Intervention Attend pulmonary rehab and do exercises to strengthen her legs.    Expected Outcomes Her legs will become stronger and she will be  able to walk longer distances.           Core Components/Risk Factors/Patient Goals Review:   Goals and Risk Factor Review    Row Name 10/20/20 1339             Core Components/Risk Factors/Patient Goals Review   Personal Goals Review Other       Review Patient referred to pulmonary rehab with SOB. She has completed 1 sessions. Her personal goals for the program are to increase strength in her legs and improve her gait and balance. We will continue to monitor her progress as she works towards meeting these goals.       Expected Outcomes Patient will complete the program meeting both personal and program goals.  Core Components/Risk Factors/Patient Goals at Discharge (Final Review):   Goals and Risk Factor Review - 10/20/20 1339      Core Components/Risk Factors/Patient Goals Review   Personal Goals Review Other    Review Patient referred to pulmonary rehab with SOB. She has completed 1 sessions. Her personal goals for the program are to increase strength in her legs and improve her gait and balance. We will continue to monitor her progress as she works towards meeting these goals.    Expected Outcomes Patient will complete the program meeting both personal and program goals.           ITP Comments: ITP REVIEW Pt is making expected progress toward pulmonary rehab goals after completing 3 sessions. Recommend continued exercise, life style modification, education, and utilization of breathing techniques to increase stamina and strength and decrease shortness of breath with exertion.

## 2020-11-02 ENCOUNTER — Encounter (HOSPITAL_COMMUNITY)
Admission: RE | Admit: 2020-11-02 | Discharge: 2020-11-02 | Disposition: A | Discharge status: Home / Self Care | Payer: Medicare Other | Source: Ambulatory Visit | Attending: Pulmonary Disease | Admitting: Pulmonary Disease

## 2020-11-02 ENCOUNTER — Other Ambulatory Visit: Payer: Self-pay

## 2020-11-02 DIAGNOSIS — R942 Abnormal results of pulmonary function studies: Secondary | ICD-10-CM

## 2020-11-02 DIAGNOSIS — R0602 Shortness of breath: Secondary | ICD-10-CM | POA: Diagnosis not present

## 2020-11-02 DIAGNOSIS — R053 Chronic cough: Secondary | ICD-10-CM

## 2020-11-02 NOTE — Progress Notes (Signed)
Daily Session Note  Patient Details  Name: Angela Galvan MRN: [id number] Date of Birth: [birth year] Referring Provider:   Flowsheet Row PULMONARY REHAB OTHER RESP ORIENTATION from 10/14/2020 in [place]  Referring Provider Dr. [name]      Encounter Date: 11/02/2020  Check In:  Session Check In - 11/02/20 1500      Check-In   Supervising physician immediately available to respond to emergencies [facility] MD immediately available    Physician(s) Dr. Harrington Challenger    Location [facility]    Staff Present Cathren Harsh, MS, Exercise Physiologist    Virtual Visit No    Medication changes reported     No    Fall or balance concerns reported    Yes    Tobacco Cessation No Change    Warm-up and Cool-down Performed as group-led instruction    Resistance Training Performed Yes    VAD Patient? No    PAD/SET Patient? No      Pain Assessment   Currently in Pain? No/denies    Multiple Pain Sites No           Capillary Blood Glucose: No results found for this or any previous visit (from the past 24 hour(s)).    Social History   Tobacco Use  Smoking Status Never Smoker  Smokeless Tobacco Never Used  Tobacco Comment   Never smoked    Goals Met:  Independence with exercise equipment Exercise tolerated well No report of cardiac concerns or symptoms Strength training completed today  Goals Unmet:  Not Applicable  Comments: check out 1600   Dr. Kathie Dike is Medical Director for [facility] Pulmonary Rehab.

## 2020-11-04 ENCOUNTER — Other Ambulatory Visit: Payer: Self-pay

## 2020-11-04 ENCOUNTER — Encounter (HOSPITAL_COMMUNITY)
Admission: RE | Admit: 2020-11-04 | Discharge: 2020-11-04 | Disposition: A | Discharge status: Home / Self Care | Payer: Medicare Other | Source: Ambulatory Visit | Attending: Pulmonary Disease | Admitting: Pulmonary Disease

## 2020-11-04 DIAGNOSIS — R0602 Shortness of breath: Secondary | ICD-10-CM | POA: Diagnosis not present

## 2020-11-04 DIAGNOSIS — R053 Chronic cough: Secondary | ICD-10-CM

## 2020-11-04 DIAGNOSIS — R942 Abnormal results of pulmonary function studies: Secondary | ICD-10-CM

## 2020-11-04 NOTE — Progress Notes (Signed)
Daily Session Note  Patient Details  Name: Angela Galvan MRN: [id number] Date of Birth: Apr 09, 1933 Referring Provider:   [place] from 10/14/2020 in [place]  Referring Provider Dr. [name]       Encounter Date: 11/04/2020  Check In:  Session Check In - 11/04/20 1500       Check-In   Supervising physician immediately available to respond to emergencies [facility] MD immediately available    Physician(s) Dr. Domenic Polite    Location [facility]    Staff Present Cathren Harsh, MS, Exercise Physiologist    Virtual Visit No    Medication changes reported     No    Fall or balance concerns reported    Yes    Tobacco Cessation No Change    Warm-up and Cool-down Performed as group-led instruction    Resistance Training Performed Yes    VAD Patient? No    PAD/SET Patient? No      Pain Assessment   Currently in Pain? No/denies    Multiple Pain Sites No             Capillary Blood Glucose: No results found for this or any previous visit (from the past 24 hour(s)).    Social History   Tobacco Use  Smoking Status Never  Smokeless Tobacco Never  Tobacco Comments   Never smoked    Goals Met:  Proper associated with RPD/PD & O2 Sat Independence with exercise equipment Using PLB without cueing & demonstrates good technique Exercise tolerated well No report of cardiac concerns or symptoms Strength training completed today  Goals Unmet:  Not Applicable  Comments: check out 1600   Dr. Kathie Dike is Medical Director for [facility] Pulmonary Rehab.

## 2020-11-09 ENCOUNTER — Other Ambulatory Visit: Payer: Self-pay

## 2020-11-09 ENCOUNTER — Encounter (HOSPITAL_COMMUNITY)
Admission: RE | Admit: 2020-11-09 | Discharge: 2020-11-09 | Disposition: A | Discharge status: Home / Self Care | Payer: Medicare Other | Source: Ambulatory Visit | Attending: Pulmonary Disease | Admitting: Pulmonary Disease

## 2020-11-09 DIAGNOSIS — R0602 Shortness of breath: Secondary | ICD-10-CM | POA: Diagnosis not present

## 2020-11-09 DIAGNOSIS — R942 Abnormal results of pulmonary function studies: Secondary | ICD-10-CM

## 2020-11-09 DIAGNOSIS — R053 Chronic cough: Secondary | ICD-10-CM

## 2020-11-09 NOTE — Progress Notes (Signed)
Daily Session Note  Patient Details  Name: Angela Galvan MRN: [id number] Date of Birth: 1933/05/06 Referring Provider:   Flowsheet Row PULMONARY REHAB OTHER RESP ORIENTATION from 10/14/2020 in [place]  Referring Provider Dr. [name]       Encounter Date: 11/09/2020  Check In:  Session Check In - 11/09/20 1500       Check-In   Supervising physician immediately available to respond to emergencies [facility] MD immediately available    Physician(s) Dr. Harl Bowie    Location [facility]    Staff Present Hoy Register, MS, ACSM-CEP, Exercise Physiologist    Virtual Visit No    Medication changes reported     No    Fall or balance concerns reported    Yes    Comments Has fallen several times over the past few months. Uses the rollator for balance.    Tobacco Cessation No Change    Warm-up and Cool-down Performed as group-led instruction    Resistance Training Performed Yes    VAD Patient? No    PAD/SET Patient? No      Pain Assessment   Currently in Pain? No/denies    Multiple Pain Sites No             Capillary Blood Glucose: No results found for this or any previous visit (from the past 24 hour(s)).    Social History   Tobacco Use  Smoking Status Never  Smokeless Tobacco Never  Tobacco Comments   Never smoked    Goals Met:  Independence with exercise equipment Exercise tolerated well No report of cardiac concerns or symptoms Strength training completed today  Goals Unmet:  Not Applicable  Comments: checkout time is 1600   Dr. Kathie Dike is Medical Director for [facility] Pulmonary Rehab.

## 2020-11-11 ENCOUNTER — Other Ambulatory Visit: Payer: Self-pay

## 2020-11-11 ENCOUNTER — Encounter (HOSPITAL_COMMUNITY)
Admission: RE | Admit: 2020-11-11 | Discharge: 2020-11-11 | Disposition: A | Discharge status: Home / Self Care | Payer: Medicare Other | Source: Ambulatory Visit | Attending: Pulmonary Disease | Admitting: Pulmonary Disease

## 2020-11-11 DIAGNOSIS — R0602 Shortness of breath: Secondary | ICD-10-CM | POA: Diagnosis not present

## 2020-11-11 DIAGNOSIS — R053 Chronic cough: Secondary | ICD-10-CM

## 2020-11-11 DIAGNOSIS — R942 Abnormal results of pulmonary function studies: Secondary | ICD-10-CM

## 2020-11-11 NOTE — Progress Notes (Signed)
Daily Session Note  Patient Details  Name: Angela Galvan MRN: [id number] Date of Birth: 1933/05/03 Referring Provider:   [place] from 10/14/2020 in [place]  Referring Provider Dr. [name]       Encounter Date: 11/11/2020  Check In:  Session Check In - 11/11/20 1453       Check-In   Supervising physician immediately available to respond to emergencies [facility] MD immediately available    Physician(s) Dr. Harrington Challenger    Location [facility]    Staff Present Aundra Dubin, RN, Bjorn Loser, MS, ACSM-CEP, Exercise Physiologist    Virtual Visit No    Fall or balance concerns reported    Yes    Comments Has fallen several times over the past few months. Uses the rollator for balance.    Tobacco Cessation No Change    Warm-up and Cool-down Performed as group-led instruction    Resistance Training Performed Yes    VAD Patient? No    PAD/SET Patient? No      Pain Assessment   Currently in Pain? No/denies    Multiple Pain Sites No             Capillary Blood Glucose: No results found for this or any previous visit (from the past 24 hour(s)).    Social History   Tobacco Use  Smoking Status Never  Smokeless Tobacco Never  Tobacco Comments   Never smoked    Goals Met:  Proper associated with RPD/PD & O2 Sat Independence with exercise equipment Using PLB without cueing & demonstrates good technique Exercise tolerated well No report of cardiac concerns or symptoms Strength training completed today  Goals Unmet:  Not Applicable  Comments: Check out 1600.   Dr. Kathie Dike is Medical Director for [facility] Pulmonary Rehab.

## 2020-11-16 ENCOUNTER — Encounter (HOSPITAL_COMMUNITY)
Admission: RE | Admit: 2020-11-16 | Discharge: 2020-11-16 | Disposition: A | Discharge status: Home / Self Care | Payer: Medicare Other | Source: Ambulatory Visit | Attending: Pulmonary Disease | Admitting: Pulmonary Disease

## 2020-11-16 ENCOUNTER — Other Ambulatory Visit: Payer: Self-pay

## 2020-11-16 VITALS — Wt 124.3 lb

## 2020-11-16 DIAGNOSIS — R0602 Shortness of breath: Secondary | ICD-10-CM | POA: Diagnosis not present

## 2020-11-16 DIAGNOSIS — R942 Abnormal results of pulmonary function studies: Secondary | ICD-10-CM

## 2020-11-16 DIAGNOSIS — R053 Chronic cough: Secondary | ICD-10-CM

## 2020-11-16 NOTE — Progress Notes (Signed)
Daily Session Note  Patient Details  Name: Angela Galvan MRN: [id number] Date of Birth: [birth year] Referring Provider:   Flowsheet Row PULMONARY REHAB OTHER RESP ORIENTATION from 10/14/2020 in [place]  Referring Provider Dr. [name]       Encounter Date: 11/16/2020  Check In:  Session Check In - 11/16/20 1500       Check-In   Supervising physician immediately available to respond to emergencies [facility] immediately available    Physician(s) Dr. Harl Bowie    Location [facility]    Staff Present Geanie Cooley, RN;Dalton Kris Mouton, MS, ACSM-CEP, Exercise Physiologist    Virtual Visit No    Medication changes reported     No    Fall or balance concerns reported    No    Tobacco Cessation No Change    Warm-up and Cool-down Performed as group-led instruction    Resistance Training Performed Yes    VAD Patient? No    PAD/SET Patient? No      Pain Assessment   Currently in Pain? No/denies    Multiple Pain Sites No             Capillary Blood Glucose: No results found for this or any previous visit (from the past 24 hour(s)).    Social History   Tobacco Use  Smoking Status Never  Smokeless Tobacco Never  Tobacco Comments   Never smoked    Goals Met:  Proper associated with RPD/PD & O2 Sat Independence with exercise equipment Using PLB without cueing & demonstrates good technique Exercise tolerated well No report of cardiac concerns or symptoms Strength training completed today  Goals Unmet:  Not Applicable  Comments: check out @ 4:00pm   Dr. Kathie Dike is Medical Director for [facility] Pulmonary Rehab.

## 2020-11-17 NOTE — Progress Notes (Signed)
Pulmonary Individual Treatment Plan  Patient Details  Name: Angela Galvan MRN: [id number] Date of Birth: 11-23-1932 Referring Provider:   [facility] from 10/14/2020 in [facility]  Referring Provider Dr. [name]       Initial Encounter Date:  Flowsheet Row PULMONARY REHAB OTHER RESP ORIENTATION from 10/14/2020 in [facility]  Date 10/14/20       Visit Diagnosis: Shortness of breath  Decreased diffusion capacity  Chronic cough  Patient's Home Medications on Admission:   Current Outpatient Medications:    acetaminophen (TYLENOL) 500 MG tablet, Take 500-1,000 mg by mouth every 6 (six) hours as needed for mild pain or moderate pain. Pain , Disp: , Rfl:    albuterol (VENTOLIN HFA) 108 (90 Base) MCG/ACT inhaler, INHALE 2 PUFFS EVERY 6 HOURS AS NEEDED FOR WHEEZING OR SHORTNESS OF BREATH (Patient taking differently: Inhale 2 puffs into the lungs every 6 (six) hours as needed for wheezing or shortness of breath.), Disp: 8.5 g, Rfl: 3   ALPRAZolam (XANAX) 0.5 MG tablet, Take 0.5 mg by mouth 2 (two) times daily as needed for sleep or anxiety., Disp: , Rfl:    aspirin 325 MG tablet, Take 325 mg by mouth daily., Disp: , Rfl:    aspirin EC 81 MG tablet, Take 81 mg by mouth daily. Swallow whole., Disp: , Rfl:    atorvastatin (LIPITOR) 10 MG tablet, Take 1 tablet (10 mg total) by mouth daily after breakfast., Disp: 90 tablet, Rfl: 0   BREO ELLIPTA 200-25 MCG/INH AEPB, Inhale 1 puff into the lungs daily. (Patient not taking: Reported on 09/21/2020), Disp: , Rfl:    clidinium-chlordiazePOXIDE (LIBRAX) 5-2.5 MG capsule, Use once daily in the morning. Further doses as needed up to 4 a day (Patient taking differently: Take 1 capsule by mouth daily as needed (constipation).), Disp: 40 capsule, Rfl: 5   FLUoxetine (PROZAC) 20 MG capsule, TAKE ONE CAPSULE BY MOUTH ONCE DAILY. (Patient taking differently: Take 20 mg by mouth  every morning.), Disp: 30 capsule, Rfl: 0   fluticasone (FLONASE) 50 MCG/ACT nasal spray, Place 1 spray into both nostrils daily. (Patient taking differently: Place 1 spray into both nostrils daily as needed for allergies or rhinitis.), Disp: 16 mL, Rfl: 12   gabapentin (NEURONTIN) 100 MG capsule, Take by mouth., Disp: , Rfl:    gabapentin (NEURONTIN) 300 MG capsule, TAKE 1 CAPSULE BY MOUTH 3 TIMES A DAY AS NEEDED (Patient taking differently: Take 300 mg by mouth at bedtime.), Disp: 90 capsule, Rfl: 3   levothyroxine (SYNTHROID) 75 MCG tablet, Take 1 tablet (75 mcg total) by mouth daily. (Patient taking differently: Take 75 mcg by mouth daily before breakfast.), Disp: 90 tablet, Rfl: 3   levothyroxine (SYNTHROID) 88 MCG tablet, Take 88 mcg by mouth daily before breakfast., Disp: , Rfl:    loperamide (IMODIUM) 2 MG capsule, Take 2 mg by mouth as needed for diarrhea or loose stools., Disp: , Rfl:    loratadine (CLARITIN) 10 MG tablet, Take 10 mg by mouth daily., Disp: , Rfl:    losartan (COZAAR) 100 MG tablet, Take 1 tablet (100 mg total) by mouth daily., Disp: 90 tablet, Rfl: 0   meloxicam (MOBIC) 15 MG tablet, Take 1 tablet by mouth daily., Disp: , Rfl:    RABEprazole (ACIPHEX) 20 MG tablet, TAKE 1 TABLET BY MOUTH 2 TIMES DAILY (Patient taking differently: Take 20 mg by mouth in the morning and at bedtime.), Disp: 180 tablet, Rfl:  2   RESTASIS 0.05 % ophthalmic emulsion, Place 1 drop into both eyes 2 (two) times daily. 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   Grade III, 3.5 cm, ER-/PR- left breast s/p mastectomy with SLN (0/1)    Depression    Diabetes mellitus    no meds.-diet controlled   Diarrhea    Diverticulitis    s/p colectomy in 1991   GERD (gastroesophageal reflux disease)    HOH (hard of hearing)    HTN (hypertension)    Does not see a cardiologist   Hyperlipidemia    Hypothyroidism    IBS (irritable bowel syndrome)    S/P colonoscopy 2003   minimal internal hemorrhoids, pancolonic diverticula, biopsy of rectum: lymphoplasmacytic microscopic colitis, colon biopsies normal   S/P colonoscopy 2012   pancolonic diverticula, nl TI, external hemorrhoidal tag,/rectum bx no abnormalities   S/P endoscopy 2012   normal esophagus, stenotic pyloric channel, TTS dilation   Stroke ([place]) 08/10/2006   no residual    Tobacco Use: Social History   Tobacco Use  Smoking Status Never  Smokeless Tobacco Never  Tobacco Comments   Never smoked    Labs: Recent Review Flowsheet Data     Labs for ITP Cardiac and Pulmonary Rehab Latest Ref Rng & Units 11/04/2016 09/12/2017 01/29/2018 11/08/2018 03/11/2019   Cholestrol 0 - 200 - - 142 - -   LDLCALC - - - 44 - -   HDL 35 - 70 - - 70 - -   Trlycerides 40 - 160 - - 141 - -   Hemoglobin A1c 4.0 - 5.6 % 5.5 6.0 5.9 5.9 6.1(A)       Capillary Blood Glucose: Lab Results  Component Value Date   GLUCAP 175 (H) 12/02/2019   GLUCAP 108 (H) 11/04/2016   GLUCAP 132 (H) 11/03/2016   GLUCAP 107 (H) 11/03/2016   GLUCAP 117 (H) 01/29/2015     Pulmonary Assessment Scores:  Pulmonary Assessment Scores     Row Name 10/14/20 1448         ADL UCSD   SOB Score total 50           CAT Score     CAT Score 21           mMRC Score     mMRC Score 3            UCSD: Self-administered rating of dyspnea associated with activities of daily living (ADLs) 6-point scale (0 = "not at all" to 5 = "maximal or unable to do because of breathlessness")  Scoring Scores range from 0 to 120.  Minimally important difference is 5 units  CAT: CAT can identify the health  impairment of COPD patients and is better correlated with disease progression.  CAT has a scoring range of zero to 40. The CAT score is classified into four groups of low (less than 10), medium (10 - 20), high (21-30) and very high (31-40) based on the impact level of disease on health status. A CAT score  over 10 suggests significant symptoms.  A worsening CAT score could be explained by an exacerbation, poor medication adherence, poor inhaler technique, or progression of COPD or comorbid conditions.  CAT MCID is 2 points  mMRC: mMRC (Modified Medical Research Council) Dyspnea Scale is used to assess the degree of baseline functional disability in patients of respiratory disease due to dyspnea. No minimal important difference is established. A decrease in score of 1 point or greater is considered a positive change.   Pulmonary Function Assessment:   Exercise Target Goals: Exercise Program Goal: Individual exercise prescription set using results from initial 6 min walk test and THRR while considering  patient's activity barriers and safety.   Exercise Prescription Goal: Initial exercise prescription builds to 30-45 minutes a day of aerobic activity, 2-3 days per week.  Home exercise guidelines will be given to patient during program as part of exercise prescription that the participant will acknowledge.  Activity Barriers & Risk Stratification:  Activity Barriers & Cardiac Risk Stratification - 10/14/20 1451       Activity Barriers & Cardiac Risk Stratification   Activity Barriers Arthritis;Left Knee Replacement;Right Knee Replacement;Back Problems;Neck/Spine Problems;Joint Problems;Deconditioning;Shortness of Breath;Balance Concerns;History of Falls;Assistive Device    Cardiac Risk Stratification Moderate             6 Minute Walk:  6 Minute Walk     Row Name 10/14/20 1604         6 Minute Walk   Phase Initial     Distance 800 feet     Walk Time 6 minutes     # of Rest  Breaks 1     MPH 1.52     METS 1.13     RPE 13     Perceived Dyspnea  15     VO2 Peak 3.96     Symptoms Yes (comment)     Comments 1 seated rest break due to shortness of breath     Resting HR 76 bpm     Resting BP 122/56     Resting Oxygen Saturation  95 %     Exercise Oxygen Saturation  during 6 min walk 93 %     Max Ex. HR 102 bpm     Max Ex. BP 124/52     2 Minute Post BP 116/60           Interval HR     1 Minute HR 94     2 Minute HR 100     3 Minute HR 102     4 Minute HR 100     5 Minute HR 101     6 Minute HR 102     2 Minute Post HR 83     Interval Heart Rate? Yes           Interval Oxygen     Interval Oxygen? Yes     Baseline Oxygen Saturation % 95 %     1 Minute Oxygen Saturation % 93 %     1 Minute Liters of Oxygen 0 L     2 Minute Oxygen Saturation % 94 %     2 Minute Liters of Oxygen 0 L     3 Minute Oxygen Saturation % 95 %     3 Minute Liters of Oxygen 0 L     4 Minute Oxygen Saturation % 95 %     4 Minute Liters of Oxygen 0 L     5 Minute Oxygen Saturation %  95 %     5 Minute Liters of Oxygen 0 L     6 Minute Oxygen Saturation % 96 %     6 Minute Liters of Oxygen 0 L     2 Minute Post Oxygen Saturation % 96 %     2 Minute Post Liters of Oxygen 0 L             Oxygen Initial Assessment:  Oxygen Initial Assessment - 10/14/20 1609       Initial 6 min Walk   Oxygen Used None      Program Oxygen Prescription   Program Oxygen Prescription None      Intervention   Short Term Goals To learn and exhibit compliance with exercise, home and travel O2 prescription;To learn and understand importance of monitoring SPO2 with pulse oximeter and demonstrate accurate use of the pulse oximeter.;To learn and understand importance of maintaining oxygen saturations>88%;To learn and demonstrate proper pursed lip breathing techniques or other breathing techniques. ;To learn and demonstrate proper use of respiratory medications    Long  Term Goals Exhibits  compliance with exercise, home  and travel O2 prescription;Verbalizes importance of monitoring SPO2 with pulse oximeter and return demonstration;Maintenance of O2 saturations>88%;Exhibits proper breathing techniques, such as pursed lip breathing or other method taught during program session;Compliance with respiratory medication             Oxygen Re-Evaluation:  Oxygen Re-Evaluation     Row Name 10/27/20 0754 11/17/20 0710           Program Oxygen Prescription   Program Oxygen Prescription None None             Home Oxygen      Home Oxygen Device None None      Sleep Oxygen Prescription None None      Home Exercise Oxygen Prescription None None      Home Resting Oxygen Prescription None None      Compliance with Home Oxygen Use Yes Yes             Goals/Expected Outcomes      Short Term Goals To learn and exhibit compliance with exercise, home and travel O2 prescription;To learn and understand importance of monitoring SPO2 with pulse oximeter and demonstrate accurate use of the pulse oximeter.;To learn and understand importance of maintaining oxygen saturations>88%;To learn and demonstrate proper pursed lip breathing techniques or other breathing techniques. ;To learn and demonstrate proper use of respiratory medications To learn and exhibit compliance with exercise, home and travel O2 prescription;To learn and understand importance of monitoring SPO2 with pulse oximeter and demonstrate accurate use of the pulse oximeter.;To learn and understand importance of maintaining oxygen saturations>88%;To learn and demonstrate proper pursed lip breathing techniques or other breathing techniques. ;To learn and demonstrate proper use of respiratory medications      Long  Term Goals Exhibits compliance with exercise, home  and travel O2 prescription;Verbalizes importance of monitoring SPO2 with pulse oximeter and return demonstration;Maintenance of O2 saturations>88%;Exhibits proper breathing  techniques, such as pursed lip breathing or other method taught during program session;Compliance with respiratory medication Exhibits compliance with exercise, home  and travel O2 prescription;Verbalizes importance of monitoring SPO2 with pulse oximeter and return demonstration;Maintenance of O2 saturations>88%;Exhibits proper breathing techniques, such as pursed lip breathing or other method taught during program session;Compliance with respiratory medication      Goals/Expected Outcomes Compliance Compliance              Oxygen Discharge (  Final Oxygen Re-Evaluation):  Oxygen Re-Evaluation - 11/17/20 0710       Program Oxygen Prescription   Program Oxygen Prescription None      Home Oxygen   Home Oxygen Device None    Sleep Oxygen Prescription None    Home Exercise Oxygen Prescription None    Home Resting Oxygen Prescription None    Compliance with Home Oxygen Use Yes      Goals/Expected Outcomes   Short Term Goals To learn and exhibit compliance with exercise, home and travel O2 prescription;To learn and understand importance of monitoring SPO2 with pulse oximeter and demonstrate accurate use of the pulse oximeter.;To learn and understand importance of maintaining oxygen saturations>88%;To learn and demonstrate proper pursed lip breathing techniques or other breathing techniques. ;To learn and demonstrate proper use of respiratory medications    Long  Term Goals Exhibits compliance with exercise, home  and travel O2 prescription;Verbalizes importance of monitoring SPO2 with pulse oximeter and return demonstration;Maintenance of O2 saturations>88%;Exhibits proper breathing techniques, such as pursed lip breathing or other method taught during program session;Compliance with respiratory medication    Goals/Expected Outcomes Compliance             Initial Exercise Prescription:  Initial Exercise Prescription - 10/14/20 1600       Date of Initial Exercise RX and Referring Provider    Date 10/14/20    Referring Provider Dr. [name]    Expected Discharge Date 02/17/21      NuStep   Level 1    SPM 60    Minutes 39      Prescription Details   Frequency (times per week) 2    Duration Progress to 30 minutes of continuous aerobic without signs/symptoms of physical distress      Intensity   THRR 40-80% of Max Heartrate 54-107    Ratings of Perceived Exertion 11-13    Perceived Dyspnea 0-4      Resistance Training   Training Prescription Yes    Weight 1 lbs    Reps 10-15             Perform Capillary Blood Glucose checks as needed.  Exercise Prescription Changes:   Exercise Prescription Changes     Row Name 10/26/20 0749 11/16/20 1600           Response to Exercise   Blood Pressure (Admit) 130/58 148/62      Blood Pressure (Exercise) 140/62 158/68      Blood Pressure (Exit) 130/62 136/68      Heart Rate (Admit) 89 bpm 88 bpm      Heart Rate (Exercise) 95 bpm 86 bpm      Heart Rate (Exit) 83 bpm 76 bpm      Oxygen Saturation (Admit) 96 % 97 %      Oxygen Saturation (Exercise) 95 % 95 %      Oxygen Saturation (Exit) 94 % 95 %      Rating of Perceived Exertion (Exercise) 13 11      Perceived Dyspnea (Exercise) 13 11      Duration Continue with 30 min of aerobic exercise without signs/symptoms of physical distress. Continue with 30 min of aerobic exercise without signs/symptoms of physical distress.      Intensity THRR unchanged THRR unchanged             Progression      Progression Continue to progress workloads to maintain intensity without signs/symptoms of physical distress. Continue to progress workloads to maintain intensity  without signs/symptoms of physical distress.             Resistance Training      Training Prescription Yes Yes      Weight 1 lbs 1 lbs      Reps 10-15 10-15      Time 10 Minutes 10 Minutes             NuStep      Level 1 1      SPM 57 74      Minutes 39 39      METs 1.8 1.7              Exercise  Comments:   Exercise Comments     Row Name 10/19/20 1600           Exercise Comments Patient completed first exercise session. She tolerated exercise well. She complained of leg pain about halfway through. This is probably due to fatigue from deconditioning. She took frequent breaks and is hopeful it will get better. She wants to decrease her shortness of breath in this program. She is very nice and she looks forward to coming back to rehab.                Exercise Goals and Review:   Exercise Goals     Row Name 10/14/20 1608 10/27/20 0750 11/17/20 0712         Exercise Goals   Increase Physical Activity Yes Yes Yes     Intervention Provide advice, education, support and counseling about physical activity/exercise needs.;Develop an individualized exercise prescription for aerobic and resistive training based on initial evaluation findings, risk stratification, comorbidities and participant's personal goals. Provide advice, education, support and counseling about physical activity/exercise needs.;Develop an individualized exercise prescription for aerobic and resistive training based on initial evaluation findings, risk stratification, comorbidities and participant's personal goals. Provide advice, education, support and counseling about physical activity/exercise needs.;Develop an individualized exercise prescription for aerobic and resistive training based on initial evaluation findings, risk stratification, comorbidities and participant's personal goals.     Expected Outcomes Short Term: Attend rehab on a regular basis to increase amount of physical activity.;Long Term: Add in home exercise to make exercise part of routine and to increase amount of physical activity.;Long Term: Exercising regularly at least 3-5 days a week. Short Term: Attend rehab on a regular basis to increase amount of physical activity.;Long Term: Add in home exercise to make exercise part of routine and to increase  amount of physical activity.;Long Term: Exercising regularly at least 3-5 days a week. Short Term: Attend rehab on a regular basis to increase amount of physical activity.;Long Term: Add in home exercise to make exercise part of routine and to increase amount of physical activity.;Long Term: Exercising regularly at least 3-5 days a week.     Increase Strength and Stamina Yes Yes Yes     Intervention Provide advice, education, support and counseling about physical activity/exercise needs.;Develop an individualized exercise prescription for aerobic and resistive training based on initial evaluation findings, risk stratification, comorbidities and participant's personal goals. Provide advice, education, support and counseling about physical activity/exercise needs.;Develop an individualized exercise prescription for aerobic and resistive training based on initial evaluation findings, risk stratification, comorbidities and participant's personal goals. Provide advice, education, support and counseling about physical activity/exercise needs.;Develop an individualized exercise prescription for aerobic and resistive training based on initial evaluation findings, risk stratification, comorbidities and participant's personal goals.     Expected Outcomes Short Term: Increase  workloads from initial exercise prescription for resistance, speed, and METs.;Short Term: Perform resistance training exercises routinely during rehab and add in resistance training at home;Long Term: Improve cardiorespiratory fitness, muscular endurance and strength as measured by increased METs and functional capacity (6MWT) Short Term: Increase workloads from initial exercise prescription for resistance, speed, and METs.;Short Term: Perform resistance training exercises routinely during rehab and add in resistance training at home;Long Term: Improve cardiorespiratory fitness, muscular endurance and strength as measured by increased METs and  functional capacity (6MWT) Short Term: Increase workloads from initial exercise prescription for resistance, speed, and METs.;Short Term: Perform resistance training exercises routinely during rehab and add in resistance training at home;Long Term: Improve cardiorespiratory fitness, muscular endurance and strength as measured by increased METs and functional capacity (6MWT)     Able to understand and use rate of perceived exertion (RPE) scale Yes Yes Yes     Intervention Provide education and explanation on how to use RPE scale Provide education and explanation on how to use RPE scale Provide education and explanation on how to use RPE scale     Expected Outcomes Short Term: Able to use RPE daily in rehab to express subjective intensity level;Long Term:  Able to use RPE to guide intensity level when exercising independently Short Term: Able to use RPE daily in rehab to express subjective intensity level;Long Term:  Able to use RPE to guide intensity level when exercising independently Short Term: Able to use RPE daily in rehab to express subjective intensity level;Long Term:  Able to use RPE to guide intensity level when exercising independently     Able to understand and use Dyspnea scale Yes Yes Yes     Intervention Provide education and explanation on how to use Dyspnea scale Provide education and explanation on how to use Dyspnea scale Provide education and explanation on how to use Dyspnea scale     Expected Outcomes Short Term: Able to use Dyspnea scale daily in rehab to express subjective sense of shortness of breath during exertion;Long Term: Able to use Dyspnea scale to guide intensity level when exercising independently Short Term: Able to use Dyspnea scale daily in rehab to express subjective sense of shortness of breath during exertion;Long Term: Able to use Dyspnea scale to guide intensity level when exercising independently Short Term: Able to use Dyspnea scale daily in rehab to express  subjective sense of shortness of breath during exertion;Long Term: Able to use Dyspnea scale to guide intensity level when exercising independently     Knowledge and understanding of Target Heart Rate Range (THRR) Yes Yes Yes     Intervention Provide education and explanation of THRR including how the numbers were predicted and where they are located for reference Provide education and explanation of THRR including how the numbers were predicted and where they are located for reference Provide education and explanation of THRR including how the numbers were predicted and where they are located for reference     Expected Outcomes Short Term: Able to state/look up THRR;Long Term: Able to use THRR to govern intensity when exercising independently;Short Term: Able to use daily as guideline for intensity in rehab Short Term: Able to state/look up THRR;Long Term: Able to use THRR to govern intensity when exercising independently;Short Term: Able to use daily as guideline for intensity in rehab Short Term: Able to state/look up THRR;Long Term: Able to use THRR to govern intensity when exercising independently;Short Term: Able to use daily as guideline for intensity in rehab  Understanding of Exercise Prescription Yes Yes Yes     Intervention Provide education, explanation, and written materials on patient's individual exercise prescription Provide education, explanation, and written materials on patient's individual exercise prescription Provide education, explanation, and written materials on patient's individual exercise prescription     Expected Outcomes Short Term: Able to explain program exercise prescription;Long Term: Able to explain home exercise prescription to exercise independently Short Term: Able to explain program exercise prescription;Long Term: Able to explain home exercise prescription to exercise independently Short Term: Able to explain program exercise prescription;Long Term: Able to explain  home exercise prescription to exercise independently              Exercise Goals Re-Evaluation :  Exercise Goals Re-Evaluation     Row Name 10/27/20 0751 11/17/20 0713           Exercise Goal Re-Evaluation   Exercise Goals Review Increase Physical Activity;Increase Strength and Stamina;Able to understand and use rate of perceived exertion (RPE) scale;Able to understand and use Dyspnea scale;Knowledge and understanding of Target Heart Rate Range (THRR);Able to check pulse independently;Understanding of Exercise Prescription Increase Physical Activity;Increase Strength and Stamina;Able to understand and use rate of perceived exertion (RPE) scale;Able to understand and use Dyspnea scale;Knowledge and understanding of Target Heart Rate Range (THRR);Able to check pulse independently;Understanding of Exercise Prescription      Comments Patient has completed 2 execise sessions. She is very deconditioned. She gets very tired out by the warm up. She has complained of muscle soreness and fatigue during exercise as well as the next session. She seems a little discouraged, but I reassured her that it is a lot at first and that she would have to get used to it. She agreed. She is eager about working towards her goals. She is currenlty exercising at 1.8 METs on the NuStep. Will continue to monitor and progess as able. Pt has completed 8 exercise sessions. She is progressing slowly. She is now able to complete the warm up and is taking less breaks while on the stepper. She enjoys coming to rehab and is motivated to progress. She is currently exercising at 1.7 METs on the stepper. Will continue to monitor and progress as able.      Expected Outcomes Through rehab and exercise at home, patient will achieve their goals. Through rehab and exercise at home, patient will achieve their goals.               Discharge Exercise Prescription (Final Exercise Prescription Changes):  Exercise Prescription Changes -  11/16/20 1600       Response to Exercise   Blood Pressure (Admit) 148/62    Blood Pressure (Exercise) 158/68    Blood Pressure (Exit) 136/68    Heart Rate (Admit) 88 bpm    Heart Rate (Exercise) 86 bpm    Heart Rate (Exit) 76 bpm    Oxygen Saturation (Admit) 97 %    Oxygen Saturation (Exercise) 95 %    Oxygen Saturation (Exit) 95 %    Rating of Perceived Exertion (Exercise) 11    Perceived Dyspnea (Exercise) 11    Duration Continue with 30 min of aerobic exercise without signs/symptoms of physical distress.    Intensity THRR unchanged      Progression   Progression Continue to progress workloads to maintain intensity without signs/symptoms of physical distress.      Resistance Training   Training Prescription Yes    Weight 1 lbs    Reps 10-15  Time 10 Minutes      NuStep   Level 1    SPM 74    Minutes 39    METs 1.7             Nutrition:  Target Goals: Understanding of nutrition guidelines, daily intake of sodium 1500mg , cholesterol 200mg , calories 30% from fat and 7% or less from saturated fats, daily to have 5 or more servings of fruits and vegetables.  Biometrics:  Pre Biometrics - 10/27/20 0751       Pre Biometrics   Weight 57 kg    BMI (Calculated) 24.54              Nutrition Therapy Plan and Nutrition Goals:  Nutrition Therapy & Goals - 10/15/20 0754       Personal Nutrition Goals   Comments Patient scored 43 on her diet assessement. She is in a long term care facility making it difficult to change her diet. We provide 2 educational sessions on heart healthy nutrition with handouts and offer assistance with RD referral.      Intervention Plan   Intervention Nutrition handout(s) given to patient.             Nutrition Assessments:  Nutrition Assessments - 10/14/20 1453       MEDFICTS Scores   Pre Score 43            MEDIFICTS Score Key: ?70 Need to make dietary changes  40-70 Heart Healthy Diet ? 40 Therapeutic Level  Cholesterol Diet   Picture Your Plate Scores: <67 Unhealthy dietary pattern with much room for improvement. 41-50 Dietary pattern unlikely to meet recommendations for good health and room for improvement. 51-60 More healthful dietary pattern, with some room for improvement.  >60 Healthy dietary pattern, although there may be some specific behaviors that could be improved.    Nutrition Goals Re-Evaluation:   Nutrition Goals Discharge (Final Nutrition Goals Re-Evaluation):   Psychosocial: Target Goals: Acknowledge presence or absence of significant depression and/or stress, maximize coping skills, provide positive support system. Participant is able to verbalize types and ability to use techniques and skills needed for reducing stress and depression.  Initial Review & Psychosocial Screening:  Initial Psych Review & Screening - 10/14/20 1452       Initial Review   Current issues with Current Depression      Family Dynamics   Good Support System? Yes    Comments Her daughter, son in law, grand daughter, and cousin all support her.      Barriers   Psychosocial barriers to participate in program The patient should benefit from training in stress management and relaxation.      Screening Interventions   Interventions Encouraged to exercise    Expected Outcomes Long Term goal: The participant improves quality of Life and PHQ9 Scores as seen by post scores and/or verbalization of changes;Short Term goal: Identification and review with participant of any Quality of Life or Depression concerns found by scoring the questionnaire.             Quality of Life Scores:  Quality of Life - 10/14/20 1557       Quality of Life   Select Quality of Life      Quality of Life Scores   Health/Function Pre 20.4 %    Socioeconomic Pre 30 %    Psych/Spiritual Pre 30 %    Family Pre 30 %    GLOBAL Pre 25.64 %  Scores of 19 and below usually indicate a poorer quality of life  in these areas.  A difference of  2-3 points is a clinically meaningful difference.  A difference of 2-3 points in the total score of the Quality of Life Index has been associated with significant improvement in overall quality of life, self-image, physical symptoms, and general health in studies assessing change in quality of life.   PHQ-9: Recent Review Flowsheet Data     Depression screen [facility] 2/9 10/14/2020 03/11/2019   Decreased Interest 3 0   Down, Depressed, Hopeless 3 0   PHQ - 2 Score 6 0   Altered sleeping 3 -   Tired, decreased energy 3 -   Change in appetite 2 -   Feeling bad or failure about yourself  2 -   Trouble concentrating 1 -   Moving slowly or fidgety/restless 0 -   Suicidal thoughts 0 -   PHQ-9 Score 17 -   Difficult doing work/chores Very difficult -      Interpretation of Total Score  Total Score Depression Severity:  1-4 = Minimal depression, 5-9 = Mild depression, 10-14 = Moderate depression, 15-19 = Moderately severe depression, 20-27 = Severe depression   Psychosocial Evaluation and Intervention:  Psychosocial Evaluation - 10/14/20 1558       Psychosocial Evaluation & Interventions   Interventions Stress management education;Encouraged to exercise with the program and follow exercise prescription    Comments Pt has no barriers to completing pulmonary rehab. The only psychosocial issue that she has is depression. She has been depressed since her husband died in 03/15/19. She is prescribed Zoloft. She reports that she thinks that it helps. Her PHQ-9 was a 17. A lot of this is related to the death of her husband. She also lives in a nursing home. She would like to move back home, but she understands that this will likely never happen due to her frequent falls and not having a caretaker available throughout the day. She states that she copes well and has a strong support system with her daughter, son in law, grand daughter, and her cousin. Her goals in  the program are to decrease her shortness of breath and to be able to walk better. Her legs give out a lot causing her to fall. She is hopeful that by coming to rehab, her legs will become stronger, and she will be able to walk better and fall less. She is excited to start the program and will likely benefit from the social aspect of the group class as well.    Expected Outcomes Her PHQ-9 score will decrease.    Continue Psychosocial Services  Follow up required by staff             Psychosocial Re-Evaluation:  Psychosocial Re-Evaluation     Morrice Name 10/20/20 1338 11/10/20 1256           Psychosocial Re-Evaluation   Current issues with Current Depression Current Depression      Comments Patient is new to the program completing 1 sessions. She continues on Zoloft for management of her depression. We will continue to monitor. Patient has no psychosocial barriers identified. She has completed 6 sessions. She continues on Zoloft for management of her depression. She seems to enjoy coming to class and interacting with other patients and staff. She demonstrates a positive attitude and an interest in improving her health. We will continue to monitor.      Expected Outcomes Patient's  depression will continue to be managed with Zoloft and she will continue to have no additional psychosocial issues identified. Patient will continue to have no psychosocial barriers identified and her depression will continue to be managed with Zoloft.      Interventions Stress management education;Relaxation education;Encouraged to attend Pulmonary Rehabilitation for the exercise Stress management education;Relaxation education;Encouraged to attend Pulmonary Rehabilitation for the exercise      Continue Psychosocial Services  No Follow up required No Follow up required               Psychosocial Discharge (Final Psychosocial Re-Evaluation):  Psychosocial Re-Evaluation - 11/10/20 1256       Psychosocial  Re-Evaluation   Current issues with Current Depression    Comments Patient has no psychosocial barriers identified. She has completed 6 sessions. She continues on Zoloft for management of her depression. She seems to enjoy coming to class and interacting with other patients and staff. She demonstrates a positive attitude and an interest in improving her health. We will continue to monitor.    Expected Outcomes Patient will continue to have no psychosocial barriers identified and her depression will continue to be managed with Zoloft.    Interventions Stress management education;Relaxation education;Encouraged to attend Pulmonary Rehabilitation for the exercise    Continue Psychosocial Services  No Follow up required              Education: Education Goals: Education classes will be provided on a weekly basis, covering required topics. Participant will state understanding/return demonstration of topics presented.  Learning Barriers/Preferences:  Learning Barriers/Preferences - 10/14/20 1459       Learning Barriers/Preferences   Learning Barriers Hearing   HOH and wears hearing aides   Learning Preferences Written Material;Skilled Demonstration;Individual Instruction             Education Topics: How Lungs Work and Diseases: - Discuss the anatomy of the lungs and diseases that can affect the lungs, such as COPD.   Exercise: -Discuss the importance of exercise, FITT principles of exercise, normal and abnormal responses to exercise, and how to exercise safely.   Environmental Irritants: -Discuss types of environmental irritants and how to limit exposure to environmental irritants.   Meds/Inhalers and oxygen: - Discuss respiratory medications, definition of an inhaler and oxygen, and the proper way to use an inhaler and oxygen.   Energy Saving Techniques: - Discuss methods to conserve energy and decrease shortness of breath when performing activities of daily living.     Bronchial Hygiene / Breathing Techniques: - Discuss breathing mechanics, pursed-lip breathing technique,  proper posture, effective ways to clear airways, and other functional breathing techniques   Cleaning Equipment: - Provides group verbal and written instruction about the health risks of elevated stress, cause of high stress, and healthy ways to reduce stress.   Nutrition I: Fats: - Discuss the types of cholesterol, what cholesterol does to the body, and how cholesterol levels can be controlled.   Nutrition II: Labels: -Discuss the different components of food labels and how to read food labels.   Respiratory Infections: - Discuss the signs and symptoms of respiratory infections, ways to prevent respiratory infections, and the importance of seeking medical treatment when having a respiratory infection. Flowsheet Row PULMONARY REHAB OTHER RESPIRATORY from 11/11/2020 in [place]  Date 10/28/20  Educator PB  Instruction Review Code 1- Verbalizes Understanding       Stress I: Signs and Symptoms: - Discuss the causes of stress, how stress may  lead to anxiety and depression, and ways to limit stress. Flowsheet Row PULMONARY REHAB OTHER RESPIRATORY from 11/11/2020 in [place]  Date 11/04/20  Educator mk  Instruction Review Code 2- Demonstrated Understanding       Stress II: Relaxation: -Discuss relaxation techniques to limit stress. Flowsheet Row PULMONARY REHAB OTHER RESPIRATORY from 11/11/2020 in [place] [facility]  Date 11/10/20  Educator DJ  Instruction Review Code 1- Verbalizes Understanding       Oxygen for Home/Travel: - Discuss how to prepare for travel when on oxygen and proper ways to transport and store oxygen to ensure safety.   Knowledge Questionnaire Score:  Knowledge Questionnaire Score - 10/14/20 1500       Knowledge Questionnaire Score   Pre Score 0/18             Core  Components/Risk Factors/Patient Goals at Admission:  Personal Goals and Risk Factors at Admission - 10/14/20 1504       Core Components/Risk Factors/Patient Goals on Admission    Weight Management Yes;Weight Maintenance    Intervention Weight Management: Develop a combined nutrition and exercise program designed to reach desired caloric intake, while maintaining appropriate intake of nutrient and fiber, sodium and fats, and appropriate energy expenditure required for the weight goal.;Weight Management: Provide education and appropriate resources to help participant work on and attain dietary goals.    Expected Outcomes Short Term: Continue to assess and modify interventions until short term weight is achieved;Long Term: Adherence to nutrition and physical activity/exercise program aimed toward attainment of established weight goal;Weight Maintenance: Understanding of the daily nutrition guidelines, which includes 25-35% calories from fat, 7% or less cal from saturated fats, less than 200mg  cholesterol, less than 1.5gm of sodium, & 5 or more servings of fruits and vegetables daily    Improve shortness of breath with ADL's Yes    Intervention Provide education, individualized exercise plan and daily activity instruction to help decrease symptoms of SOB with activities of daily living.    Expected Outcomes Short Term: Improve cardiorespiratory fitness to achieve a reduction of symptoms when performing ADLs;Long Term: Be able to perform more ADLs without symptoms or delay the onset of symptoms    Diabetes Yes    Intervention Provide education about signs/symptoms and action to take for hypo/hyperglycemia.;Provide education about proper nutrition, including hydration, and aerobic/resistive exercise prescription along with prescribed medications to achieve blood glucose in normal ranges: Fasting glucose 65-99 mg/dL    Expected Outcomes Short Term: Participant verbalizes understanding of the signs/symptoms and  immediate care of hyper/hypoglycemia, proper foot care and importance of medication, aerobic/resistive exercise and nutrition plan for blood glucose control.;Long Term: Attainment of HbA1C < 7%.    Hypertension Yes    Intervention Provide education on lifestyle modifcations including regular physical activity/exercise, weight management, moderate sodium restriction and increased consumption of fresh fruit, vegetables, and low fat dairy, alcohol moderation, and smoking cessation.;Monitor prescription use compliance.    Expected Outcomes Short Term: Continued assessment and intervention until BP is < 140/9mm HG in hypertensive participants. < 130/43mm HG in hypertensive participants with diabetes, heart failure or chronic kidney disease.;Long Term: Maintenance of blood pressure at goal levels.    Lipids Yes    Intervention Provide education and support for participant on nutrition & aerobic/resistive exercise along with prescribed medications to achieve LDL 70mg , HDL >40mg .    Expected Outcomes Short Term: Participant states understanding of desired cholesterol values and is compliant with medications prescribed. Participant is following exercise  prescription and nutrition guidelines.;Long Term: Cholesterol controlled with medications as prescribed, with individualized exercise RX and with personalized nutrition plan. Value goals: LDL < 70mg , HDL > 40 mg.    Personal Goal Other Yes    Personal Goal Improve her walking    Intervention Attend pulmonary rehab and do exercises to strengthen her legs.    Expected Outcomes Her legs will become stronger and she will be able to walk longer distances.             Core Components/Risk Factors/Patient Goals Review:   Goals and Risk Factor Review     Row Name 10/20/20 1339 11/10/20 1258           Core Components/Risk Factors/Patient Goals Review   Personal Goals Review Other Other      Review Patient referred to pulmonary rehab with SOB. She has  completed 1 sessions. Her personal goals for the program are to increase strength in her legs and improve her gait and balance. We will continue to monitor her progress as she works towards meeting these goals. Patient has completed 6 sessions losing 2 lbs since last 30 day review. She is doing well in the program with consistent attendance. She exercises on RA with her O2 saturation averging 94-96%. Her blood pressures are well controlled. Her personal goals for the program are to get stronger in her legs and improve her gait and balance. We will continue to monitor her progress as she works towards meeting these goals.      Expected Outcomes Patient will complete the program meeting both personal and program goals. Patient will complete the program meeting both personal and program goals.               Core Components/Risk Factors/Patient Goals at Discharge (Final Review):   Goals and Risk Factor Review - 11/10/20 1258       Core Components/Risk Factors/Patient Goals Review   Personal Goals Review Other    Review Patient has completed 6 sessions losing 2 lbs since last 30 day review. She is doing well in the program with consistent attendance. She exercises on RA with her O2 saturation averging 94-96%. Her blood pressures are well controlled. Her personal goals for the program are to get stronger in her legs and improve her gait and balance. We will continue to monitor her progress as she works towards meeting these goals.    Expected Outcomes Patient will complete the program meeting both personal and program goals.             ITP Comments:   Comments: ITP REVIEW Pt is making expected progress toward pulmonary rehab goals after completing 9 sessions. Recommend continued exercise, life style modification, education, and utilization of breathing techniques to increase stamina and strength and decrease shortness of breath with exertion.

## 2020-11-18 ENCOUNTER — Encounter (HOSPITAL_COMMUNITY)
Admission: RE | Admit: 2020-11-18 | Discharge: 2020-11-18 | Disposition: A | Discharge status: Home / Self Care | Payer: Medicare Other | Source: Ambulatory Visit | Attending: Pulmonary Disease | Admitting: Pulmonary Disease

## 2020-11-18 ENCOUNTER — Other Ambulatory Visit: Payer: Self-pay

## 2020-11-18 DIAGNOSIS — R0602 Shortness of breath: Secondary | ICD-10-CM

## 2020-11-18 DIAGNOSIS — R942 Abnormal results of pulmonary function studies: Secondary | ICD-10-CM

## 2020-11-18 DIAGNOSIS — R053 Chronic cough: Secondary | ICD-10-CM

## 2020-11-18 NOTE — Progress Notes (Signed)
Daily Session Note  Patient Details  Name: Angela Galvan MRN: [id number] Date of Birth: June 28, 1932 Referring Provider:   [place] from 10/14/2020 in [place]  Referring Provider Dr. [name]       Encounter Date: 11/18/2020  Check In:  Session Check In - 11/18/20 1447       Check-In   Supervising physician immediately available to respond to emergencies [facility] MD immediately available    Physician(s) Dr. Harrington Challenger    Location [facility]    Staff Present Aundra Dubin, RN, Bjorn Loser, MS, ACSM-CEP, Exercise Physiologist    Virtual Visit No    Medication changes reported     No    Fall or balance concerns reported    Yes    Comments Has fallen several times over the past few months. Uses the rollator for balance.    Tobacco Cessation No Change    Warm-up and Cool-down Performed as group-led instruction    Resistance Training Performed Yes    VAD Patient? No    PAD/SET Patient? No      Pain Assessment   Currently in Pain? No/denies    Multiple Pain Sites No             Capillary Blood Glucose: No results found for this or any previous visit (from the past 24 hour(s)).    Social History   Tobacco Use  Smoking Status Never  Smokeless Tobacco Never  Tobacco Comments   Never smoked    Goals Met:  Proper associated with RPD/PD & O2 Sat Independence with exercise equipment Using PLB without cueing & demonstrates good technique Exercise tolerated well No report of cardiac concerns or symptoms Strength training completed today  Goals Unmet:  Not Applicable  Comments: Check out 1600.   Dr. Kathie Dike is Medical Director for [facility] Pulmonary Rehab.

## 2020-11-23 ENCOUNTER — Encounter (HOSPITAL_COMMUNITY)
Admission: RE | Admit: 2020-11-23 | Discharge: 2020-11-23 | Disposition: A | Discharge status: Home / Self Care | Payer: Medicare Other | Source: Ambulatory Visit | Attending: Pulmonary Disease | Admitting: Pulmonary Disease

## 2020-11-23 ENCOUNTER — Other Ambulatory Visit: Payer: Self-pay

## 2020-11-23 DIAGNOSIS — R0602 Shortness of breath: Secondary | ICD-10-CM | POA: Diagnosis not present

## 2020-11-23 DIAGNOSIS — R053 Chronic cough: Secondary | ICD-10-CM

## 2020-11-23 DIAGNOSIS — R942 Abnormal results of pulmonary function studies: Secondary | ICD-10-CM

## 2020-11-23 NOTE — Progress Notes (Signed)
Daily Session Note  Patient Details  Name: Angela Galvan MRN: [id number] Date of Birth: Apr 21, 1933 Referring Provider:   Flowsheet Row PULMONARY REHAB OTHER RESP ORIENTATION from 10/14/2020 in [place]  Referring Provider Dr. [name]       Encounter Date: 11/23/2020  Check In:  Session Check In - 11/23/20 1500       Check-In   Supervising physician immediately available to respond to emergencies [facility] MD immediately available    Physician(s) Dr. Harl Bowie    Staff Present Geanie Cooley, RN;Dalton Fletcher, MS, ACSM-CEP, Exercise Physiologist    Virtual Visit No    Medication changes reported     No    Fall or balance concerns reported    No    Tobacco Cessation No Change    Warm-up and Cool-down Performed as group-led instruction    Resistance Training Performed Yes    VAD Patient? No    PAD/SET Patient? No      Pain Assessment   Currently in Pain? No/denies    Multiple Pain Sites No             Capillary Blood Glucose: No results found for this or any previous visit (from the past 24 hour(s)).    Social History   Tobacco Use  Smoking Status Never  Smokeless Tobacco Never  Tobacco Comments   Never smoked    Goals Met:  Proper associated with RPD/PD & O2 Sat Independence with exercise equipment Using PLB without cueing & demonstrates good technique Exercise tolerated well No report of cardiac concerns or symptoms Strength training completed today  Goals Unmet:  Not Applicable  Comments: check out @ 4:oopm   Dr. Kathie Dike is Medical Director for [facility] Pulmonary Rehab.

## 2020-11-25 ENCOUNTER — Encounter (HOSPITAL_COMMUNITY)
Admission: RE | Admit: 2020-11-25 | Discharge: 2020-11-25 | Disposition: A | Discharge status: Home / Self Care | Payer: Medicare Other | Source: Ambulatory Visit | Attending: Pulmonary Disease | Admitting: Pulmonary Disease

## 2020-11-25 ENCOUNTER — Other Ambulatory Visit: Payer: Self-pay

## 2020-11-25 DIAGNOSIS — R0602 Shortness of breath: Secondary | ICD-10-CM | POA: Diagnosis not present

## 2020-11-25 DIAGNOSIS — R053 Chronic cough: Secondary | ICD-10-CM

## 2020-11-25 DIAGNOSIS — R942 Abnormal results of pulmonary function studies: Secondary | ICD-10-CM

## 2020-11-25 NOTE — Progress Notes (Signed)
Daily Session Note  Patient Details  Name: Angela Galvan MRN: [id number] Date of Birth: Nov 05, 1932 Referring Provider:   Flowsheet Row PULMONARY REHAB OTHER RESP ORIENTATION from 10/14/2020 in [place]  Referring Provider Dr. [name]       Encounter Date: 11/25/2020  Check In:  Session Check In - 11/25/20 1500       Check-In   Supervising physician immediately available to respond to emergencies [facility] MD immediately available    Physician(s) Dr. Domenic Polite    Location [facility]    Staff Present Geanie Cooley, RN;Dalton Kris Mouton, MS, ACSM-CEP, Exercise Physiologist    Virtual Visit No    Medication changes reported     No    Fall or balance concerns reported    No    Comments Uses the rollator for balance    Tobacco Cessation No Change    Warm-up and Cool-down Performed as group-led instruction    Resistance Training Performed Yes    VAD Patient? No    PAD/SET Patient? No      Pain Assessment   Currently in Pain? No/denies    Multiple Pain Sites No             Capillary Blood Glucose: No results found for this or any previous visit (from the past 24 hour(s)).    Social History   Tobacco Use  Smoking Status Never  Smokeless Tobacco Never  Tobacco Comments   Never smoked    Goals Met:  Proper associated with RPD/PD & O2 Sat Independence with exercise equipment Using PLB without cueing & demonstrates good technique Exercise tolerated well No report of cardiac concerns or symptoms Strength training completed today  Goals Unmet:  Not Applicable  Comments: check out @ 4:00pm   Dr. Kathie Dike is Medical Director for [facility] Pulmonary Rehab.

## 2020-11-30 ENCOUNTER — Other Ambulatory Visit: Payer: Self-pay

## 2020-11-30 ENCOUNTER — Encounter (HOSPITAL_COMMUNITY)
Admission: RE | Admit: 2020-11-30 | Discharge: 2020-11-30 | Disposition: A | Discharge status: Home / Self Care | Payer: Medicare Other | Source: Ambulatory Visit | Attending: Pulmonary Disease | Admitting: Pulmonary Disease

## 2020-11-30 VITALS — Wt 123.9 lb

## 2020-11-30 DIAGNOSIS — R0602 Shortness of breath: Secondary | ICD-10-CM | POA: Diagnosis not present

## 2020-11-30 DIAGNOSIS — R053 Chronic cough: Secondary | ICD-10-CM | POA: Insufficient documentation

## 2020-11-30 DIAGNOSIS — R942 Abnormal results of pulmonary function studies: Secondary | ICD-10-CM | POA: Diagnosis present

## 2020-11-30 NOTE — Progress Notes (Signed)
Daily Session Note  Patient Details  Name: Angela Galvan MRN: [id number] Date of Birth: 17-Feb-1933 Referring Provider:   Flowsheet Row PULMONARY REHAB OTHER RESP ORIENTATION from 10/14/2020 in [place]  Referring Provider Dr. [name]       Encounter Date: 11/30/2020  Check In:  Session Check In - 11/30/20 1500       Check-In   Supervising physician immediately available to respond to emergencies [facility] MD immediately available    Physician(s) Dr. Johnsie Cancel    Location [facility]    Staff Present Hoy Register, MS, ACSM-CEP, Exercise Physiologist    Virtual Visit No    Medication changes reported     No    Fall or balance concerns reported    No    Tobacco Cessation No Change    Warm-up and Cool-down Performed as group-led instruction    Resistance Training Performed Yes    VAD Patient? No    PAD/SET Patient? No      Pain Assessment   Currently in Pain? No/denies    Multiple Pain Sites No             Capillary Blood Glucose: No results found for this or any previous visit (from the past 24 hour(s)).    Social History   Tobacco Use  Smoking Status Never  Smokeless Tobacco Never  Tobacco Comments   Never smoked    Goals Met:  Independence with exercise equipment Exercise tolerated well No report of cardiac concerns or symptoms Strength training completed today  Goals Unmet:  Not Applicable  Comments: checkout time is 1600   Dr. Kathie Dike is Medical Director for [facility].

## 2020-12-02 ENCOUNTER — Encounter (HOSPITAL_COMMUNITY)
Admission: RE | Admit: 2020-12-02 | Discharge: 2020-12-02 | Disposition: A | Discharge status: Home / Self Care | Payer: Medicare Other | Source: Ambulatory Visit | Attending: Pulmonary Disease | Admitting: Pulmonary Disease

## 2020-12-02 ENCOUNTER — Other Ambulatory Visit: Payer: Self-pay

## 2020-12-02 DIAGNOSIS — R0602 Shortness of breath: Secondary | ICD-10-CM | POA: Diagnosis not present

## 2020-12-02 DIAGNOSIS — R942 Abnormal results of pulmonary function studies: Secondary | ICD-10-CM

## 2020-12-02 DIAGNOSIS — R053 Chronic cough: Secondary | ICD-10-CM

## 2020-12-02 NOTE — Progress Notes (Signed)
Daily Session Note  Patient Details  Name: Angela Galvan MRN: [id number] Date of Birth: [birth year] Referring Provider:   [place] from 10/14/2020 in [place]  Referring Provider Dr. [name]       Encounter Date: 12/02/2020  Check In:  Session Check In - 12/02/20 1457       Check-In   Supervising physician immediately available to respond to emergencies [facility] MD immediately available    Physician(s) Dr. Harrington Challenger    Location [facility]    Staff Present Geanie Cooley, RN;Dalton Fletcher, MS, ACSM-CEP, Exercise Physiologist    Virtual Visit No    Medication changes reported     No    Fall or balance concerns reported    No    Comments Uses the rollator for balance    Tobacco Cessation No Change    Warm-up and Cool-down Performed as group-led instruction    Resistance Training Performed Yes    VAD Patient? No    PAD/SET Patient? No      Pain Assessment   Currently in Pain? No/denies    Multiple Pain Sites No             Capillary Blood Glucose: No results found for this or any previous visit (from the past 24 hour(s)).    Social History   Tobacco Use  Smoking Status Never  Smokeless Tobacco Never  Tobacco Comments   Never smoked    Goals Met:  Proper associated with RPD/PD & O2 Sat Independence with exercise equipment Using PLB without cueing & demonstrates good technique Exercise tolerated well No report of cardiac concerns or symptoms Strength training completed today  Goals Unmet:  Not Applicable  Comments: check out 4:00pm   Dr. Kathie Dike is Medical Director for [facility].

## 2020-12-07 ENCOUNTER — Other Ambulatory Visit: Payer: Self-pay

## 2020-12-07 ENCOUNTER — Encounter (HOSPITAL_COMMUNITY)
Admission: RE | Admit: 2020-12-07 | Discharge: 2020-12-07 | Disposition: A | Discharge status: Home / Self Care | Payer: Medicare Other | Source: Ambulatory Visit | Attending: Pulmonary Disease | Admitting: Pulmonary Disease

## 2020-12-07 DIAGNOSIS — R053 Chronic cough: Secondary | ICD-10-CM

## 2020-12-07 DIAGNOSIS — R0602 Shortness of breath: Secondary | ICD-10-CM | POA: Diagnosis not present

## 2020-12-07 DIAGNOSIS — R942 Abnormal results of pulmonary function studies: Secondary | ICD-10-CM

## 2020-12-07 NOTE — Progress Notes (Signed)
Daily Session Note  Patient Details  Name: Angela Galvan MRN: [id number] Date of Birth: Sep 18, 1932 Referring Provider:   Flowsheet Row PULMONARY REHAB OTHER RESP ORIENTATION from 10/14/2020 in [place]  Referring Provider Dr. [name]       Encounter Date: 12/07/2020  Check In:  Session Check In - 12/07/20 1500       Check-In   Supervising physician immediately available to respond to emergencies [facility] MD immediately available    Physician(s) Dr. Domenic Polite    Location [facility]    Staff Present Geanie Cooley, RN;Dalton Kris Mouton, MS, ACSM-CEP, Exercise Physiologist    Virtual Visit No    Medication changes reported     No    Fall or balance concerns reported    Yes    Comments Uses the rollator for balance    Tobacco Cessation No Change    Warm-up and Cool-down Performed as group-led instruction    Resistance Training Performed Yes    VAD Patient? No    PAD/SET Patient? No      Pain Assessment   Currently in Pain? No/denies    Multiple Pain Sites No             Capillary Blood Glucose: No results found for this or any previous visit (from the past 24 hour(s)).    Social History   Tobacco Use  Smoking Status Never  Smokeless Tobacco Never  Tobacco Comments   Never smoked    Goals Met:  Proper associated with RPD/PD & O2 Sat Independence with exercise equipment Using PLB without cueing & demonstrates good technique Exercise tolerated well No report of cardiac concerns or symptoms Strength training completed today  Goals Unmet:  Not Applicable  Comments: check out @ 4:00pm   Dr. Kathie Dike is Medical Director for [facility].

## 2020-12-09 ENCOUNTER — Other Ambulatory Visit: Payer: Self-pay

## 2020-12-09 ENCOUNTER — Telehealth: Payer: Self-pay | Admitting: Pulmonary Disease

## 2020-12-09 ENCOUNTER — Encounter (HOSPITAL_COMMUNITY)
Admission: RE | Admit: 2020-12-09 | Discharge: 2020-12-09 | Disposition: A | Discharge status: Home / Self Care | Payer: Medicare Other | Source: Ambulatory Visit | Attending: Pulmonary Disease | Admitting: Pulmonary Disease

## 2020-12-09 DIAGNOSIS — R0602 Shortness of breath: Secondary | ICD-10-CM | POA: Diagnosis not present

## 2020-12-09 DIAGNOSIS — R053 Chronic cough: Secondary | ICD-10-CM

## 2020-12-09 DIAGNOSIS — R942 Abnormal results of pulmonary function studies: Secondary | ICD-10-CM

## 2020-12-09 NOTE — Telephone Encounter (Signed)
Forwarding to front desk pool for f/u on this, thanks

## 2020-12-09 NOTE — Telephone Encounter (Signed)
Patient on recall list to see me in July. No appointment seen. Please schedule patient for routine follow-up on next available.

## 2020-12-09 NOTE — Telephone Encounter (Signed)
ATC Patient.  Left detailed message on VM of Jerri (DPR) to call office and get scheduled routine follow up visit with Dr. [name].

## 2020-12-09 NOTE — Progress Notes (Signed)
Daily Session Note  Patient Details  Name: Basha B Kinn MRN: [id number] Date of Birth: 10-20-1932 Referring Provider:   Flowsheet Row PULMONARY REHAB OTHER RESP ORIENTATION from 10/14/2020 in [place]  Referring Provider Dr. [name]       Encounter Date: 12/09/2020  Check In:  Session Check In - 12/09/20 1500       Check-In   Supervising physician immediately available to respond to emergencies [facility] immediately available    Physician(s) Dr. Domenic Polite    Location [facility]    Staff Present Aundra Dubin, RN, Bjorn Loser, MS, ACSM-CEP, Exercise Physiologist;Phyllis Billingsley, RN    Virtual Visit No    Medication changes reported     No    Fall or balance concerns reported    Yes    Comments Uses the rollator for balance    Tobacco Cessation No Change    Warm-up and Cool-down Performed as group-led instruction    Resistance Training Performed Yes    VAD Patient? No    PAD/SET Patient? No      Pain Assessment   Currently in Pain? No/denies    Multiple Pain Sites No             Capillary Blood Glucose: No results found for this or any previous visit (from the past 24 hour(s)).    Social History   Tobacco Use  Smoking Status Never  Smokeless Tobacco Never  Tobacco Comments   Never smoked    Goals Met:  Proper associated with RPD/PD & O2 Sat Independence with exercise equipment Using PLB without cueing & demonstrates good technique Exercise tolerated well No report of cardiac concerns or symptoms Strength training completed today  Goals Unmet:  Not Applicable  Comments: Check out 1600.   Dr. Kathie Dike is Medical Director for [facility].

## 2020-12-14 ENCOUNTER — Other Ambulatory Visit: Payer: Self-pay

## 2020-12-14 ENCOUNTER — Encounter (HOSPITAL_COMMUNITY)
Admission: RE | Admit: 2020-12-14 | Discharge: 2020-12-14 | Disposition: A | Discharge status: Home / Self Care | Payer: Medicare Other | Source: Ambulatory Visit | Attending: Pulmonary Disease | Admitting: Pulmonary Disease

## 2020-12-14 VITALS — Wt 124.6 lb

## 2020-12-14 DIAGNOSIS — R053 Chronic cough: Secondary | ICD-10-CM

## 2020-12-14 DIAGNOSIS — R0602 Shortness of breath: Secondary | ICD-10-CM

## 2020-12-14 DIAGNOSIS — R942 Abnormal results of pulmonary function studies: Secondary | ICD-10-CM

## 2020-12-14 NOTE — Progress Notes (Signed)
Daily Session Note  Patient Details  Name: Angela Galvan MRN: [id number] Date of Birth: 03/30/1933 Referring Provider:   Flowsheet Row PULMONARY REHAB OTHER RESP ORIENTATION from 10/14/2020 in [place]  Referring Provider Dr. [name]       Encounter Date: 12/14/2020  Check In:  Session Check In - 12/14/20 1500       Check-In   Supervising physician immediately available to respond to emergencies [facility] MD immediately available    Physician(s) Dr. Domenic Polite    Location [facility]    Staff Present Hoy Register, MS, ACSM-CEP, Exercise Physiologist;Phyllis Billingsley, RN    Virtual Visit No    Medication changes reported     No    Fall or balance concerns reported    No    Tobacco Cessation No Change    Warm-up and Cool-down Performed as group-led instruction    Resistance Training Performed Yes    VAD Patient? No    PAD/SET Patient? No      Pain Assessment   Currently in Pain? No/denies    Multiple Pain Sites No             Capillary Blood Glucose: No results found for this or any previous visit (from the past 24 hour(s)).    Social History   Tobacco Use  Smoking Status Never  Smokeless Tobacco Never  Tobacco Comments   Never smoked    Goals Met:  Independence with exercise equipment Exercise tolerated well No report of cardiac concerns or symptoms Strength training completed today  Goals Unmet:  Not Applicable  Comments: checkout time is 1600   Dr. Kathie Dike is Medical Director for [facility].

## 2020-12-14 NOTE — Telephone Encounter (Signed)
Called spoke with daughter. She is going to look at her calendar and call us back to make appointment. When daughter calls back please make next available with Dr. [name]

## 2020-12-15 NOTE — Progress Notes (Signed)
Pulmonary Individual Treatment Plan  Patient Details  Name: Angela Galvan MRN: [id number] Date of Birth: 03-23-1933 Referring Provider:   [facility] from 10/14/2020 in [place]  Referring Provider Dr. [name]       Initial Encounter Date:  Flowsheet Row PULMONARY REHAB OTHER RESP ORIENTATION from 10/14/2020 in [place]  Date 10/14/20       Visit Diagnosis: Shortness of breath  Chronic cough  Decreased diffusion capacity  Patient's Home Medications on Admission:   Current Outpatient Medications:    acetaminophen (TYLENOL) 500 MG tablet, Take 500-1,000 mg by mouth every 6 (six) hours as needed for mild pain or moderate pain. Pain , Disp: , Rfl:    albuterol (VENTOLIN HFA) 108 (90 Base) MCG/ACT inhaler, INHALE 2 PUFFS EVERY 6 HOURS AS NEEDED FOR WHEEZING OR SHORTNESS OF BREATH (Patient taking differently: Inhale 2 puffs into the lungs every 6 (six) hours as needed for wheezing or shortness of breath.), Disp: 8.5 g, Rfl: 3   ALPRAZolam (XANAX) 0.5 MG tablet, Take 0.5 mg by mouth 2 (two) times daily as needed for sleep or anxiety., Disp: , Rfl:    aspirin 325 MG tablet, Take 325 mg by mouth daily., Disp: , Rfl:    aspirin EC 81 MG tablet, Take 81 mg by mouth daily. Swallow whole., Disp: , Rfl:    atorvastatin (LIPITOR) 10 MG tablet, Take 1 tablet (10 mg total) by mouth daily after breakfast., Disp: 90 tablet, Rfl: 0   BREO ELLIPTA 200-25 MCG/INH AEPB, Inhale 1 puff into the lungs daily. (Patient not taking: Reported on 09/21/2020), Disp: , Rfl:    clidinium-chlordiazePOXIDE (LIBRAX) 5-2.5 MG capsule, Use once daily in the morning. Further doses as needed up to 4 a day (Patient taking differently: Take 1 capsule by mouth daily as needed (constipation).), Disp: 40 capsule, Rfl: 5   FLUoxetine (PROZAC) 20 MG capsule, TAKE ONE CAPSULE BY MOUTH ONCE DAILY. (Patient taking differently: Take 20 mg by mouth  every morning.), Disp: 30 capsule, Rfl: 0   fluticasone (FLONASE) 50 MCG/ACT nasal spray, Place 1 spray into both nostrils daily. (Patient taking differently: Place 1 spray into both nostrils daily as needed for allergies or rhinitis.), Disp: 16 mL, Rfl: 12   gabapentin (NEURONTIN) 100 MG capsule, Take by mouth., Disp: , Rfl:    gabapentin (NEURONTIN) 300 MG capsule, TAKE 1 CAPSULE BY MOUTH 3 TIMES A DAY AS NEEDED (Patient taking differently: Take 300 mg by mouth at bedtime.), Disp: 90 capsule, Rfl: 3   levothyroxine (SYNTHROID) 75 MCG tablet, Take 1 tablet (75 mcg total) by mouth daily. (Patient taking differently: Take 75 mcg by mouth daily before breakfast.), Disp: 90 tablet, Rfl: 3   levothyroxine (SYNTHROID) 88 MCG tablet, Take 88 mcg by mouth daily before breakfast., Disp: , Rfl:    loperamide (IMODIUM) 2 MG capsule, Take 2 mg by mouth as needed for diarrhea or loose stools., Disp: , Rfl:    loratadine (CLARITIN) 10 MG tablet, Take 10 mg by mouth daily., Disp: , Rfl:    losartan (COZAAR) 100 MG tablet, Take 1 tablet (100 mg total) by mouth daily., Disp: 90 tablet, Rfl: 0   meloxicam (MOBIC) 15 MG tablet, Take 1 tablet by mouth daily., Disp: , Rfl:    RABEprazole (ACIPHEX) 20 MG tablet, TAKE 1 TABLET BY MOUTH 2 TIMES DAILY (Patient taking differently: Take 20 mg by mouth in the morning and at bedtime.), Disp: 180 tablet, Rfl:  2   RESTASIS 0.05 % ophthalmic emulsion, Place 1 drop into both eyes 2 (two) times daily. 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   Grade III, 3.5 cm, ER-/PR- left breast s/p mastectomy with SLN (0/1)    Depression    Diabetes mellitus    no meds.-diet controlled   Diarrhea    Diverticulitis    s/p colectomy in 1991   GERD (gastroesophageal reflux disease)    HOH (hard of hearing)    HTN (hypertension)    Does not see a cardiologist   Hyperlipidemia    Hypothyroidism    IBS (irritable bowel syndrome)    S/P colonoscopy 2003   minimal internal hemorrhoids, pancolonic diverticula, biopsy of rectum: lymphoplasmacytic microscopic colitis, colon biopsies normal   S/P colonoscopy 2012   pancolonic diverticula, nl TI, external hemorrhoidal tag,/rectum bx no abnormalities   S/P endoscopy 2012   normal esophagus, stenotic pyloric channel, TTS dilation   Stroke ([place]) 08/10/2006   no residual    Tobacco Use: Social History   Tobacco Use  Smoking Status Never  Smokeless Tobacco Never  Tobacco Comments   Never smoked    Labs: Recent Review Flowsheet Data     Labs for ITP Cardiac and Pulmonary Rehab Latest Ref Rng & Units 11/04/2016 09/12/2017 01/29/2018 11/08/2018 03/11/2019   Cholestrol 0 - 200 - - 142 - -   LDLCALC - - - 44 - -   HDL 35 - 70 - - 70 - -   Trlycerides 40 - 160 - - 141 - -   Hemoglobin A1c 4.0 - 5.6 % 5.5 6.0 5.9 5.9 6.1(A)       Capillary Blood Glucose: Lab Results  Component Value Date   GLUCAP 175 (H) 12/02/2019   GLUCAP 108 (H) 11/04/2016   GLUCAP 132 (H) 11/03/2016   GLUCAP 107 (H) 11/03/2016   GLUCAP 117 (H) 01/29/2015     Pulmonary Assessment Scores:  Pulmonary Assessment Scores     Row Name 10/14/20 1448         ADL UCSD   SOB Score total 50           CAT Score     CAT Score 21           mMRC Score     mMRC Score 3            UCSD: Self-administered rating of dyspnea associated with activities of daily living (ADLs) 6-point scale (0 = "not at all" to 5 = "maximal or unable to do because of breathlessness")  Scoring Scores range from 0 to 120.  Minimally important difference is 5 units  CAT: CAT can identify the health  impairment of COPD patients and is better correlated with disease progression.  CAT has a scoring range of zero to 40. The CAT score is classified into four groups of low (less than 10), medium (10 - 20), high (21-30) and very high (31-40) based on the impact level of disease on health status. A CAT score  over 10 suggests significant symptoms.  A worsening CAT score could be explained by an exacerbation, poor medication adherence, poor inhaler technique, or progression of COPD or comorbid conditions.  CAT MCID is 2 points  mMRC: mMRC (Modified Medical Research Council) Dyspnea Scale is used to assess the degree of baseline functional disability in patients of respiratory disease due to dyspnea. No minimal important difference is established. A decrease in score of 1 point or greater is considered a positive change.   Pulmonary Function Assessment:   Exercise Target Goals: Exercise Program Goal: Individual exercise prescription set using results from initial 6 min walk test and THRR while considering  patient's activity barriers and safety.   Exercise Prescription Goal: Initial exercise prescription builds to 30-45 minutes a day of aerobic activity, 2-3 days per week.  Home exercise guidelines will be given to patient during program as part of exercise prescription that the participant will acknowledge.  Activity Barriers & Risk Stratification:  Activity Barriers & Cardiac Risk Stratification - 10/14/20 1451       Activity Barriers & Cardiac Risk Stratification   Activity Barriers Arthritis;Left Knee Replacement;Right Knee Replacement;Back Problems;Neck/Spine Problems;Joint Problems;Deconditioning;Shortness of Breath;Balance Concerns;History of Falls;Assistive Device    Cardiac Risk Stratification Moderate             6 Minute Walk:  6 Minute Walk     Row Name 10/14/20 1604         6 Minute Walk   Phase Initial     Distance 800 feet     Walk Time 6 minutes     # of Rest  Breaks 1     MPH 1.52     METS 1.13     RPE 13     Perceived Dyspnea  15     VO2 Peak 3.96     Symptoms Yes (comment)     Comments 1 seated rest break due to shortness of breath     Resting HR 76 bpm     Resting BP 122/56     Resting Oxygen Saturation  95 %     Exercise Oxygen Saturation  during 6 min walk 93 %     Max Ex. HR 102 bpm     Max Ex. BP 124/52     2 Minute Post BP 116/60           Interval HR     1 Minute HR 94     2 Minute HR 100     3 Minute HR 102     4 Minute HR 100     5 Minute HR 101     6 Minute HR 102     2 Minute Post HR 83     Interval Heart Rate? Yes           Interval Oxygen     Interval Oxygen? Yes     Baseline Oxygen Saturation % 95 %     1 Minute Oxygen Saturation % 93 %     1 Minute Liters of Oxygen 0 L     2 Minute Oxygen Saturation % 94 %     2 Minute Liters of Oxygen 0 L     3 Minute Oxygen Saturation % 95 %     3 Minute Liters of Oxygen 0 L     4 Minute Oxygen Saturation % 95 %     4 Minute Liters of Oxygen 0 L     5 Minute Oxygen Saturation %  95 %     5 Minute Liters of Oxygen 0 L     6 Minute Oxygen Saturation % 96 %     6 Minute Liters of Oxygen 0 L     2 Minute Post Oxygen Saturation % 96 %     2 Minute Post Liters of Oxygen 0 L             Oxygen Initial Assessment:  Oxygen Initial Assessment - 10/14/20 1609       Initial 6 min Walk   Oxygen Used None      Program Oxygen Prescription   Program Oxygen Prescription None      Intervention   Short Term Goals To learn and exhibit compliance with exercise, home and travel O2 prescription;To learn and understand importance of monitoring SPO2 with pulse oximeter and demonstrate accurate use of the pulse oximeter.;To learn and understand importance of maintaining oxygen saturations>88%;To learn and demonstrate proper pursed lip breathing techniques or other breathing techniques. ;To learn and demonstrate proper use of respiratory medications    Long  Term Goals Exhibits  compliance with exercise, home  and travel O2 prescription;Verbalizes importance of monitoring SPO2 with pulse oximeter and return demonstration;Maintenance of O2 saturations>88%;Exhibits proper breathing techniques, such as pursed lip breathing or other method taught during program session;Compliance with respiratory medication             Oxygen Re-Evaluation:  Oxygen Re-Evaluation     Row Name 10/27/20 0754 11/17/20 0710 12/14/20 1557         Program Oxygen Prescription   Program Oxygen Prescription None None None           Home Oxygen       Home Oxygen Device None None None     Sleep Oxygen Prescription None None None     Home Exercise Oxygen Prescription None None None     Home Resting Oxygen Prescription None None None     Compliance with Home Oxygen Use Yes Yes Yes           Goals/Expected Outcomes       Short Term Goals To learn and exhibit compliance with exercise, home and travel O2 prescription;To learn and understand importance of monitoring SPO2 with pulse oximeter and demonstrate accurate use of the pulse oximeter.;To learn and understand importance of maintaining oxygen saturations>88%;To learn and demonstrate proper pursed lip breathing techniques or other breathing techniques. ;To learn and demonstrate proper use of respiratory medications To learn and exhibit compliance with exercise, home and travel O2 prescription;To learn and understand importance of monitoring SPO2 with pulse oximeter and demonstrate accurate use of the pulse oximeter.;To learn and understand importance of maintaining oxygen saturations>88%;To learn and demonstrate proper pursed lip breathing techniques or other breathing techniques. ;To learn and demonstrate proper use of respiratory medications To learn and exhibit compliance with exercise, home and travel O2 prescription;To learn and understand importance of monitoring SPO2 with pulse oximeter and demonstrate accurate use of the pulse oximeter.;To  learn and understand importance of maintaining oxygen saturations>88%;To learn and demonstrate proper pursed lip breathing techniques or other breathing techniques. ;To learn and demonstrate proper use of respiratory medications     Long  Term Goals Exhibits compliance with exercise, home  and travel O2 prescription;Verbalizes importance of monitoring SPO2 with pulse oximeter and return demonstration;Maintenance of O2 saturations>88%;Exhibits proper breathing techniques, such as pursed lip breathing or other method taught during program session;Compliance with respiratory medication Exhibits compliance with exercise, home  and  travel O2 prescription;Verbalizes importance of monitoring SPO2 with pulse oximeter and return demonstration;Maintenance of O2 saturations>88%;Exhibits proper breathing techniques, such as pursed lip breathing or other method taught during program session;Compliance with respiratory medication Exhibits compliance with exercise, home  and travel O2 prescription;Verbalizes importance of monitoring SPO2 with pulse oximeter and return demonstration;Maintenance of O2 saturations>88%;Exhibits proper breathing techniques, such as pursed lip breathing or other method taught during program session;Compliance with respiratory medication     Goals/Expected Outcomes Compliance Compliance Compliance             Oxygen Discharge (Final Oxygen Re-Evaluation):  Oxygen Re-Evaluation - 12/14/20 1557       Program Oxygen Prescription   Program Oxygen Prescription None      Home Oxygen   Home Oxygen Device None    Sleep Oxygen Prescription None    Home Exercise Oxygen Prescription None    Home Resting Oxygen Prescription None    Compliance with Home Oxygen Use Yes      Goals/Expected Outcomes   Short Term Goals To learn and exhibit compliance with exercise, home and travel O2 prescription;To learn and understand importance of monitoring SPO2 with pulse oximeter and demonstrate accurate use  of the pulse oximeter.;To learn and understand importance of maintaining oxygen saturations>88%;To learn and demonstrate proper pursed lip breathing techniques or other breathing techniques. ;To learn and demonstrate proper use of respiratory medications    Long  Term Goals Exhibits compliance with exercise, home  and travel O2 prescription;Verbalizes importance of monitoring SPO2 with pulse oximeter and return demonstration;Maintenance of O2 saturations>88%;Exhibits proper breathing techniques, such as pursed lip breathing or other method taught during program session;Compliance with respiratory medication    Goals/Expected Outcomes Compliance             Initial Exercise Prescription:  Initial Exercise Prescription - 10/14/20 1600       Date of Initial Exercise RX and Referring Provider   Date 10/14/20    Referring Provider Dr. [name]    Expected Discharge Date 02/17/21      NuStep   Level 1    SPM 60    Minutes 39      Prescription Details   Frequency (times per week) 2    Duration Progress to 30 minutes of continuous aerobic without signs/symptoms of physical distress      Intensity   THRR 40-80% of Max Heartrate 54-107    Ratings of Perceived Exertion 11-13    Perceived Dyspnea 0-4      Resistance Training   Training Prescription Yes    Weight 1 lbs    Reps 10-15             Perform Capillary Blood Glucose checks as needed.  Exercise Prescription Changes:   Exercise Prescription Changes     Row Name 10/26/20 0749 11/16/20 1600 11/30/20 1500 12/14/20 1500       Response to Exercise   Blood Pressure (Admit) 130/58 148/62 128/58 112/58    Blood Pressure (Exercise) 140/62 158/68 142/60 148/60    Blood Pressure (Exit) 130/62 136/68 122/58 124/66    Heart Rate (Admit) 89 bpm 88 bpm 88 bpm 80 bpm    Heart Rate (Exercise) 95 bpm 86 bpm 90 bpm 95 bpm    Heart Rate (Exit) 83 bpm 76 bpm 81 bpm 82 bpm    Oxygen Saturation (Admit) 96 % 97 % 96 % 97 %    Oxygen  Saturation (Exercise) 95 % 95 % 95 % 96 %  Oxygen Saturation (Exit) 94 % 95 % 94 % 95 %    Rating of Perceived Exertion (Exercise) 13 11 12 11     Perceived Dyspnea (Exercise) 13 11 12 13     Duration Continue with 30 min of aerobic exercise without signs/symptoms of physical distress. Continue with 30 min of aerobic exercise without signs/symptoms of physical distress. Continue with 30 min of aerobic exercise without signs/symptoms of physical distress. Continue with 30 min of aerobic exercise without signs/symptoms of physical distress.    Intensity THRR unchanged THRR unchanged THRR unchanged THRR unchanged         Progression        Progression Continue to progress workloads to maintain intensity without signs/symptoms of physical distress. Continue to progress workloads to maintain intensity without signs/symptoms of physical distress. Continue to progress workloads to maintain intensity without signs/symptoms of physical distress. Continue to progress workloads to maintain intensity without signs/symptoms of physical distress.         Resistance Training        Training Prescription Yes Yes Yes Yes    Weight 1 lbs 1 lbs 2 lbs 2 lbs    Reps 10-15 10-15 10-15 10-15    Time 10 Minutes 10 Minutes 10 Minutes 10 Minutes         NuStep        Level 1 1 1 1     SPM 57 74 84 93    Minutes 39 39 39 39    METs 1.8 1.7 1.8 1.8            Exercise Comments:   Exercise Comments     Row Name 10/19/20 1600           Exercise Comments Patient completed first exercise session. She tolerated exercise well. She complained of leg pain about halfway through. This is probably due to fatigue from deconditioning. She took frequent breaks and is hopeful it will get better. She wants to decrease her shortness of breath in this program. She is very nice and she looks forward to coming back to rehab.                Exercise Goals and Review:   Exercise Goals     Row Name 10/14/20 1608  10/27/20 0750 11/17/20 0712 12/14/20 1558       Exercise Goals   Increase Physical Activity Yes Yes Yes Yes    Intervention Provide advice, education, support and counseling about physical activity/exercise needs.;Develop an individualized exercise prescription for aerobic and resistive training based on initial evaluation findings, risk stratification, comorbidities and participant's personal goals. Provide advice, education, support and counseling about physical activity/exercise needs.;Develop an individualized exercise prescription for aerobic and resistive training based on initial evaluation findings, risk stratification, comorbidities and participant's personal goals. Provide advice, education, support and counseling about physical activity/exercise needs.;Develop an individualized exercise prescription for aerobic and resistive training based on initial evaluation findings, risk stratification, comorbidities and participant's personal goals. Provide advice, education, support and counseling about physical activity/exercise needs.;Develop an individualized exercise prescription for aerobic and resistive training based on initial evaluation findings, risk stratification, comorbidities and participant's personal goals.    Expected Outcomes Short Term: Attend rehab on a regular basis to increase amount of physical activity.;Long Term: Add in home exercise to make exercise part of routine and to increase amount of physical activity.;Long Term: Exercising regularly at least 3-5 days a week. Short Term: Attend rehab on a regular basis to increase amount of  physical activity.;Long Term: Add in home exercise to make exercise part of routine and to increase amount of physical activity.;Long Term: Exercising regularly at least 3-5 days a week. Short Term: Attend rehab on a regular basis to increase amount of physical activity.;Long Term: Add in home exercise to make exercise part of routine and to increase amount  of physical activity.;Long Term: Exercising regularly at least 3-5 days a week. Short Term: Attend rehab on a regular basis to increase amount of physical activity.;Long Term: Add in home exercise to make exercise part of routine and to increase amount of physical activity.;Long Term: Exercising regularly at least 3-5 days a week.    Increase Strength and Stamina Yes Yes Yes Yes    Intervention Provide advice, education, support and counseling about physical activity/exercise needs.;Develop an individualized exercise prescription for aerobic and resistive training based on initial evaluation findings, risk stratification, comorbidities and participant's personal goals. Provide advice, education, support and counseling about physical activity/exercise needs.;Develop an individualized exercise prescription for aerobic and resistive training based on initial evaluation findings, risk stratification, comorbidities and participant's personal goals. Provide advice, education, support and counseling about physical activity/exercise needs.;Develop an individualized exercise prescription for aerobic and resistive training based on initial evaluation findings, risk stratification, comorbidities and participant's personal goals. Provide advice, education, support and counseling about physical activity/exercise needs.;Develop an individualized exercise prescription for aerobic and resistive training based on initial evaluation findings, risk stratification, comorbidities and participant's personal goals.    Expected Outcomes Short Term: Increase workloads from initial exercise prescription for resistance, speed, and METs.;Short Term: Perform resistance training exercises routinely during rehab and add in resistance training at home;Long Term: Improve cardiorespiratory fitness, muscular endurance and strength as measured by increased METs and functional capacity (6MWT) Short Term: Increase workloads from initial exercise  prescription for resistance, speed, and METs.;Short Term: Perform resistance training exercises routinely during rehab and add in resistance training at home;Long Term: Improve cardiorespiratory fitness, muscular endurance and strength as measured by increased METs and functional capacity (6MWT) Short Term: Increase workloads from initial exercise prescription for resistance, speed, and METs.;Short Term: Perform resistance training exercises routinely during rehab and add in resistance training at home;Long Term: Improve cardiorespiratory fitness, muscular endurance and strength as measured by increased METs and functional capacity (6MWT) Short Term: Increase workloads from initial exercise prescription for resistance, speed, and METs.;Short Term: Perform resistance training exercises routinely during rehab and add in resistance training at home;Long Term: Improve cardiorespiratory fitness, muscular endurance and strength as measured by increased METs and functional capacity (6MWT)    Able to understand and use rate of perceived exertion (RPE) scale Yes Yes Yes Yes    Intervention Provide education and explanation on how to use RPE scale Provide education and explanation on how to use RPE scale Provide education and explanation on how to use RPE scale Provide education and explanation on how to use RPE scale    Expected Outcomes Short Term: Able to use RPE daily in rehab to express subjective intensity level;Long Term:  Able to use RPE to guide intensity level when exercising independently Short Term: Able to use RPE daily in rehab to express subjective intensity level;Long Term:  Able to use RPE to guide intensity level when exercising independently Short Term: Able to use RPE daily in rehab to express subjective intensity level;Long Term:  Able to use RPE to guide intensity level when exercising independently Short Term: Able to use RPE daily in rehab to express subjective intensity level;Long Term:  Able to  use RPE to guide intensity level when exercising independently    Able to understand and use Dyspnea scale Yes Yes Yes Yes    Intervention Provide education and explanation on how to use Dyspnea scale Provide education and explanation on how to use Dyspnea scale Provide education and explanation on how to use Dyspnea scale Provide education and explanation on how to use Dyspnea scale    Expected Outcomes Short Term: Able to use Dyspnea scale daily in rehab to express subjective sense of shortness of breath during exertion;Long Term: Able to use Dyspnea scale to guide intensity level when exercising independently Short Term: Able to use Dyspnea scale daily in rehab to express subjective sense of shortness of breath during exertion;Long Term: Able to use Dyspnea scale to guide intensity level when exercising independently Short Term: Able to use Dyspnea scale daily in rehab to express subjective sense of shortness of breath during exertion;Long Term: Able to use Dyspnea scale to guide intensity level when exercising independently Short Term: Able to use Dyspnea scale daily in rehab to express subjective sense of shortness of breath during exertion;Long Term: Able to use Dyspnea scale to guide intensity level when exercising independently    Knowledge and understanding of Target Heart Rate Range (THRR) Yes Yes Yes Yes    Intervention Provide education and explanation of THRR including how the numbers were predicted and where they are located for reference Provide education and explanation of THRR including how the numbers were predicted and where they are located for reference Provide education and explanation of THRR including how the numbers were predicted and where they are located for reference Provide education and explanation of THRR including how the numbers were predicted and where they are located for reference    Expected Outcomes Short Term: Able to state/look up THRR;Long Term: Able to use THRR to  govern intensity when exercising independently;Short Term: Able to use daily as guideline for intensity in rehab Short Term: Able to state/look up THRR;Long Term: Able to use THRR to govern intensity when exercising independently;Short Term: Able to use daily as guideline for intensity in rehab Short Term: Able to state/look up THRR;Long Term: Able to use THRR to govern intensity when exercising independently;Short Term: Able to use daily as guideline for intensity in rehab Short Term: Able to state/look up THRR;Long Term: Able to use THRR to govern intensity when exercising independently;Short Term: Able to use daily as guideline for intensity in rehab    Understanding of Exercise Prescription Yes Yes Yes Yes    Intervention Provide education, explanation, and written materials on patient's individual exercise prescription Provide education, explanation, and written materials on patient's individual exercise prescription Provide education, explanation, and written materials on patient's individual exercise prescription Provide education, explanation, and written materials on patient's individual exercise prescription    Expected Outcomes Short Term: Able to explain program exercise prescription;Long Term: Able to explain home exercise prescription to exercise independently Short Term: Able to explain program exercise prescription;Long Term: Able to explain home exercise prescription to exercise independently Short Term: Able to explain program exercise prescription;Long Term: Able to explain home exercise prescription to exercise independently Short Term: Able to explain program exercise prescription;Long Term: Able to explain home exercise prescription to exercise independently             Exercise Goals Re-Evaluation :  Exercise Goals Re-Evaluation     Row Name 10/27/20 [phone] 11/17/20 0713 12/14/20 1558  Exercise Goal Re-Evaluation   Exercise Goals Review Increase Physical  Activity;Increase Strength and Stamina;Able to understand and use rate of perceived exertion (RPE) scale;Able to understand and use Dyspnea scale;Knowledge and understanding of Target Heart Rate Range (THRR);Able to check pulse independently;Understanding of Exercise Prescription Increase Physical Activity;Increase Strength and Stamina;Able to understand and use rate of perceived exertion (RPE) scale;Able to understand and use Dyspnea scale;Knowledge and understanding of Target Heart Rate Range (THRR);Able to check pulse independently;Understanding of Exercise Prescription Increase Physical Activity;Increase Strength and Stamina;Able to understand and use rate of perceived exertion (RPE) scale;Able to understand and use Dyspnea scale;Knowledge and understanding of Target Heart Rate Range (THRR);Able to check pulse independently;Understanding of Exercise Prescription     Comments Patient has completed 2 execise sessions. She is very deconditioned. She gets very tired out by the warm up. She has complained of muscle soreness and fatigue during exercise as well as the next session. She seems a little discouraged, but I reassured her that it is a lot at first and that she would have to get used to it. She agreed. She is eager about working towards her goals. She is currenlty exercising at 1.8 METs on the NuStep. Will continue to monitor and progess as able. Pt has completed 8 exercise sessions. She is progressing slowly. She is now able to complete the warm up and is taking less breaks while on the stepper. She enjoys coming to rehab and is motivated to progress. She is currently exercising at 1.7 METs on the stepper. Will continue to monitor and progress as able. Pt has completed 16 exercise sessions. She continues to progress slowly. She is now able to do more sit to stands and her walking looks better. She is taking less breaks while on the stepper. She continues to be motivated. She is currently exercising at 1.8  METs on the stepper. Will continue to monitor and progress as able.     Expected Outcomes Through rehab and exercise at home, patient will achieve their goals. Through rehab and exercise at home, patient will achieve their goals. Through rehab and exercise at home, patient will achieve their goals.              Discharge Exercise Prescription (Final Exercise Prescription Changes):  Exercise Prescription Changes - 12/14/20 1500       Response to Exercise   Blood Pressure (Admit) 112/58    Blood Pressure (Exercise) 148/60    Blood Pressure (Exit) 124/66    Heart Rate (Admit) 80 bpm    Heart Rate (Exercise) 95 bpm    Heart Rate (Exit) 82 bpm    Oxygen Saturation (Admit) 97 %    Oxygen Saturation (Exercise) 96 %    Oxygen Saturation (Exit) 95 %    Rating of Perceived Exertion (Exercise) 11    Perceived Dyspnea (Exercise) 13    Duration Continue with 30 min of aerobic exercise without signs/symptoms of physical distress.    Intensity THRR unchanged      Progression   Progression Continue to progress workloads to maintain intensity without signs/symptoms of physical distress.      Resistance Training   Training Prescription Yes    Weight 2 lbs    Reps 10-15    Time 10 Minutes      NuStep   Level 1    SPM 93    Minutes 39    METs 1.8             Nutrition:  Target  Goals: Understanding of nutrition guidelines, daily intake of sodium 1500mg , cholesterol 200mg , calories 30% from fat and 7% or less from saturated fats, daily to have 5 or more servings of fruits and vegetables.  Biometrics:  Pre Biometrics - 10/27/20 0751       Pre Biometrics   Weight 57 kg    BMI (Calculated) 24.54              Nutrition Therapy Plan and Nutrition Goals:  Nutrition Therapy & Goals - 10/15/20 0754       Personal Nutrition Goals   Comments Patient scored 43 on her diet assessement. She is in a long term care facility making it difficult to change her diet. We provide 2  educational sessions on heart healthy nutrition with handouts and offer assistance with RD referral.      Intervention Plan   Intervention Nutrition handout(s) given to patient.             Nutrition Assessments:  Nutrition Assessments - 10/14/20 1453       MEDFICTS Scores   Pre Score 43            MEDIFICTS Score Key: ?70 Need to make dietary changes  40-70 Heart Healthy Diet ? 40 Therapeutic Level Cholesterol Diet   Picture Your Plate Scores: <97 Unhealthy dietary pattern with much room for improvement. 41-50 Dietary pattern unlikely to meet recommendations for good health and room for improvement. 51-60 More healthful dietary pattern, with some room for improvement.  >60 Healthy dietary pattern, although there may be some specific behaviors that could be improved.    Nutrition Goals Re-Evaluation:   Nutrition Goals Discharge (Final Nutrition Goals Re-Evaluation):   Psychosocial: Target Goals: Acknowledge presence or absence of significant depression and/or stress, maximize coping skills, provide positive support system. Participant is able to verbalize types and ability to use techniques and skills needed for reducing stress and depression.  Initial Review & Psychosocial Screening:  Initial Psych Review & Screening - 10/14/20 1452       Initial Review   Current issues with Current Depression      Family Dynamics   Good Support System? Yes    Comments Her daughter, son in law, grand daughter, and cousin all support her.      Barriers   Psychosocial barriers to participate in program The patient should benefit from training in stress management and relaxation.      Screening Interventions   Interventions Encouraged to exercise    Expected Outcomes Long Term goal: The participant improves quality of Life and PHQ9 Scores as seen by post scores and/or verbalization of changes;Short Term goal: Identification and review with participant of any Quality of Life  or Depression concerns found by scoring the questionnaire.             Quality of Life Scores:  Quality of Life - 10/14/20 1557       Quality of Life   Select Quality of Life      Quality of Life Scores   Health/Function Pre 20.4 %    Socioeconomic Pre 30 %    Psych/Spiritual Pre 30 %    Family Pre 30 %    GLOBAL Pre 25.64 %            Scores of 19 and below usually indicate a poorer quality of life in these areas.  A difference of  2-3 points is a clinically meaningful difference.  A difference of 2-3 points in the  total score of the Quality of Life Index has been associated with significant improvement in overall quality of life, self-image, physical symptoms, and general health in studies assessing change in quality of life.   PHQ-9: Recent Review Flowsheet Data     Depression screen [facility] 2/9 10/14/2020 03/11/2019   Decreased Interest 3 0   Down, Depressed, Hopeless 3 0   PHQ - 2 Score 6 0   Altered sleeping 3 -   Tired, decreased energy 3 -   Change in appetite 2 -   Feeling bad or failure about yourself  2 -   Trouble concentrating 1 -   Moving slowly or fidgety/restless 0 -   Suicidal thoughts 0 -   PHQ-9 Score 17 -   Difficult doing work/chores Very difficult -      Interpretation of Total Score  Total Score Depression Severity:  1-4 = Minimal depression, 5-9 = Mild depression, 10-14 = Moderate depression, 15-19 = Moderately severe depression, 20-27 = Severe depression   Psychosocial Evaluation and Intervention:  Psychosocial Evaluation - 10/14/20 1558       Psychosocial Evaluation & Interventions   Interventions Stress management education;Encouraged to exercise with the program and follow exercise prescription    Comments Pt has no barriers to completing pulmonary rehab. The only psychosocial issue that she has is depression. She has been depressed since her husband died in 03-02-19. She is prescribed Zoloft. She reports that she thinks that  it helps. Her PHQ-9 was a 17. A lot of this is related to the death of her husband. She also lives in a nursing home. She would like to move back home, but she understands that this will likely never happen due to her frequent falls and not having a caretaker available throughout the day. She states that she copes well and has a strong support system with her daughter, son in law, grand daughter, and her cousin. Her goals in the program are to decrease her shortness of breath and to be able to walk better. Her legs give out a lot causing her to fall. She is hopeful that by coming to rehab, her legs will become stronger, and she will be able to walk better and fall less. She is excited to start the program and will likely benefit from the social aspect of the group class as well.    Expected Outcomes Her PHQ-9 score will decrease.    Continue Psychosocial Services  Follow up required by staff             Psychosocial Re-Evaluation:  Psychosocial Re-Evaluation     [place] Name 10/20/20 1338 11/10/20 1256 12/09/20 0729         Psychosocial Re-Evaluation   Current issues with Current Depression Current Depression Current Depression     Comments Patient is new to the program completing 1 sessions. She continues on Zoloft for management of her depression. We will continue to monitor. Patient has no psychosocial barriers identified. She has completed 6 sessions. She continues on Zoloft for management of her depression. She seems to enjoy coming to class and interacting with other patients and staff. She demonstrates a positive attitude and an interest in improving her health. We will continue to monitor. Patient has no psychosocial barriers identified. She has completed 14 sessions. She continues on Zoloft for management of her depression. She continues to enjoy coming to class and interacting with other patients and staff. She demonstrates a positive attitude and an interest in  improving her health. We will  continue to monitor.     Expected Outcomes Patient's depression will continue to be managed with Zoloft and she will continue to have no additional psychosocial issues identified. Patient will continue to have no psychosocial barriers identified and her depression will continue to be managed with Zoloft. Patient will continue to have no psychosocial barriers identified and her depression will continue to be managed with Zoloft.     Interventions Stress management education;Relaxation education;Encouraged to attend Pulmonary Rehabilitation for the exercise Stress management education;Relaxation education;Encouraged to attend Pulmonary Rehabilitation for the exercise Stress management education;Relaxation education;Encouraged to attend Pulmonary Rehabilitation for the exercise     Continue Psychosocial Services  No Follow up required No Follow up required No Follow up required              Psychosocial Discharge (Final Psychosocial Re-Evaluation):  Psychosocial Re-Evaluation - 12/09/20 0729       Psychosocial Re-Evaluation   Current issues with Current Depression    Comments Patient has no psychosocial barriers identified. She has completed 14 sessions. She continues on Zoloft for management of her depression. She continues to enjoy coming to class and interacting with other patients and staff. She demonstrates a positive attitude and an interest in improving her health. We will continue to monitor.    Expected Outcomes Patient will continue to have no psychosocial barriers identified and her depression will continue to be managed with Zoloft.    Interventions Stress management education;Relaxation education;Encouraged to attend Pulmonary Rehabilitation for the exercise    Continue Psychosocial Services  No Follow up required              Education: Education Goals: Education classes will be provided on a weekly basis, covering required topics. Participant will state understanding/return  demonstration of topics presented.  Learning Barriers/Preferences:  Learning Barriers/Preferences - 10/14/20 1459       Learning Barriers/Preferences   Learning Barriers Hearing   HOH and wears hearing aides   Learning Preferences Written Material;Skilled Demonstration;Individual Instruction             Education Topics: How Lungs Work and Diseases: - Discuss the anatomy of the lungs and diseases that can affect the lungs, such as COPD. Flowsheet Row PULMONARY REHAB OTHER RESPIRATORY from 12/09/2020 in [place]  Date 12/02/20  Educator pb  Instruction Review Code 1- Verbalizes Understanding       Exercise: -Discuss the importance of exercise, FITT principles of exercise, normal and abnormal responses to exercise, and how to exercise safely.   Environmental Irritants: -Discuss types of environmental irritants and how to limit exposure to environmental irritants. Flowsheet Row PULMONARY REHAB OTHER RESPIRATORY from 12/09/2020 in [place]  Date 12/09/20  Educator DJ  Instruction Review Code 1- Verbalizes Understanding       Meds/Inhalers and oxygen: - Discuss respiratory medications, definition of an inhaler and oxygen, and the proper way to use an inhaler and oxygen.   Energy Saving Techniques: - Discuss methods to conserve energy and decrease shortness of breath when performing activities of daily living.    Bronchial Hygiene / Breathing Techniques: - Discuss breathing mechanics, pursed-lip breathing technique,  proper posture, effective ways to clear airways, and other functional breathing techniques   Cleaning Equipment: - Provides group verbal and written instruction about the health risks of elevated stress, cause of high stress, and healthy ways to reduce stress.   Nutrition I: Fats: - Discuss the types of cholesterol, what  cholesterol does to the body, and how cholesterol levels can be  controlled.   Nutrition II: Labels: -Discuss the different components of food labels and how to read food labels.   Respiratory Infections: - Discuss the signs and symptoms of respiratory infections, ways to prevent respiratory infections, and the importance of seeking medical treatment when having a respiratory infection. Flowsheet Row PULMONARY REHAB OTHER RESPIRATORY from 12/09/2020 in [place]  Date 10/28/20  Educator PB  Instruction Review Code 1- Verbalizes Understanding       Stress I: Signs and Symptoms: - Discuss the causes of stress, how stress may lead to anxiety and depression, and ways to limit stress. Flowsheet Row PULMONARY REHAB OTHER RESPIRATORY from 12/09/2020 in [place]  Date 11/04/20  Educator mk  Instruction Review Code 2- Demonstrated Understanding       Stress II: Relaxation: -Discuss relaxation techniques to limit stress. Flowsheet Row PULMONARY REHAB OTHER RESPIRATORY from 12/09/2020 in [place]  Date 11/10/20  Educator DJ  Instruction Review Code 1- Verbalizes Understanding       Oxygen for Home/Travel: - Discuss how to prepare for travel when on oxygen and proper ways to transport and store oxygen to ensure safety. Flowsheet Row PULMONARY REHAB OTHER RESPIRATORY from 12/09/2020 in [place]  Date 11/18/20  Educator DJ  Instruction Review Code 1- Verbalizes Understanding       Knowledge Questionnaire Score:  Knowledge Questionnaire Score - 10/14/20 1500       Knowledge Questionnaire Score   Pre Score 0/18             Core Components/Risk Factors/Patient Goals at Admission:  Personal Goals and Risk Factors at Admission - 10/14/20 1504       Core Components/Risk Factors/Patient Goals on Admission    Weight Management Yes;Weight Maintenance    Intervention Weight Management: Develop a combined nutrition and exercise program designed  to reach desired caloric intake, while maintaining appropriate intake of nutrient and fiber, sodium and fats, and appropriate energy expenditure required for the weight goal.;Weight Management: Provide education and appropriate resources to help participant work on and attain dietary goals.    Expected Outcomes Short Term: Continue to assess and modify interventions until short term weight is achieved;Long Term: Adherence to nutrition and physical activity/exercise program aimed toward attainment of established weight goal;Weight Maintenance: Understanding of the daily nutrition guidelines, which includes 25-35% calories from fat, 7% or less cal from saturated fats, less than 200mg  cholesterol, less than 1.5gm of sodium, & 5 or more servings of fruits and vegetables daily    Improve shortness of breath with ADL's Yes    Intervention Provide education, individualized exercise plan and daily activity instruction to help decrease symptoms of SOB with activities of daily living.    Expected Outcomes Short Term: Improve cardiorespiratory fitness to achieve a reduction of symptoms when performing ADLs;Long Term: Be able to perform more ADLs without symptoms or delay the onset of symptoms    Diabetes Yes    Intervention Provide education about signs/symptoms and action to take for hypo/hyperglycemia.;Provide education about proper nutrition, including hydration, and aerobic/resistive exercise prescription along with prescribed medications to achieve blood glucose in normal ranges: Fasting glucose 65-99 mg/dL    Expected Outcomes Short Term: Participant verbalizes understanding of the signs/symptoms and immediate care of hyper/hypoglycemia, proper foot care and importance of medication, aerobic/resistive exercise and nutrition plan for blood glucose control.;Long Term: Attainment of HbA1C < 7%.  Hypertension Yes    Intervention Provide education on lifestyle modifcations including regular physical  activity/exercise, weight management, moderate sodium restriction and increased consumption of fresh fruit, vegetables, and low fat dairy, alcohol moderation, and smoking cessation.;Monitor prescription use compliance.    Expected Outcomes Short Term: Continued assessment and intervention until BP is < 140/57mm HG in hypertensive participants. < 130/31mm HG in hypertensive participants with diabetes, heart failure or chronic kidney disease.;Long Term: Maintenance of blood pressure at goal levels.    Lipids Yes    Intervention Provide education and support for participant on nutrition & aerobic/resistive exercise along with prescribed medications to achieve LDL 70mg , HDL >40mg .    Expected Outcomes Short Term: Participant states understanding of desired cholesterol values and is compliant with medications prescribed. Participant is following exercise prescription and nutrition guidelines.;Long Term: Cholesterol controlled with medications as prescribed, with individualized exercise RX and with personalized nutrition plan. Value goals: LDL < 70mg , HDL > 40 mg.    Personal Goal Other Yes    Personal Goal Improve her walking    Intervention Attend pulmonary rehab and do exercises to strengthen her legs.    Expected Outcomes Her legs will become stronger and she will be able to walk longer distances.             Core Components/Risk Factors/Patient Goals Review:   Goals and Risk Factor Review     Row Name 10/20/20 1339 11/10/20 1258 12/09/20 0729         Core Components/Risk Factors/Patient Goals Review   Personal Goals Review Other Other Other     Review Patient referred to pulmonary rehab with SOB. She has completed 1 sessions. Her personal goals for the program are to increase strength in her legs and improve her gait and balance. We will continue to monitor her progress as she works towards meeting these goals. Patient has completed 6 sessions losing 2 lbs since last 30 day review. She is  doing well in the program with consistent attendance. She exercises on RA with her O2 saturation averging 94-96%. Her blood pressures are well controlled. Her personal goals for the program are to get stronger in her legs and improve her gait and balance. We will continue to monitor her progress as she works towards meeting these goals. Paitent has completed 14 sessions gaining 1 lb since last 30 day review. She continues to do well in the program with consistent attendance. Her blood pressure continues to be controlled and her O2 saturations are running 95-96% on RA during exercise. Her continues to ambulate with a rollator. She has not had any falls since she started the program. Her personal goals for the program are to get stronger in her legs and improve her gait and balance. We will continue to monitor her progress as she works towards meeting these goals.     Expected Outcomes Patient will complete the program meeting both personal and program goals. Patient will complete the program meeting both personal and program goals. Patient will complete the program meeting both personal and program goals.              Core Components/Risk Factors/Patient Goals at Discharge (Final Review):   Goals and Risk Factor Review - 12/09/20 0729       Core Components/Risk Factors/Patient Goals Review   Personal Goals Review Other    Review Paitent has completed 14 sessions gaining 1 lb since last 30 day review. She continues to do well in the program with consistent  attendance. Her blood pressure continues to be controlled and her O2 saturations are running 95-96% on RA during exercise. Her continues to ambulate with a rollator. She has not had any falls since she started the program. Her personal goals for the program are to get stronger in her legs and improve her gait and balance. We will continue to monitor her progress as she works towards meeting these goals.    Expected Outcomes Patient will complete the  program meeting both personal and program goals.             ITP Comments:   Comments: ITP REVIEW Pt is making expected progress toward pulmonary rehab goals after completing 17 sessions. Recommend continued exercise, life style modification, education, and utilization of breathing techniques to increase stamina and strength and decrease shortness of breath with exertion.

## 2020-12-16 ENCOUNTER — Encounter (HOSPITAL_COMMUNITY)
Admission: RE | Admit: 2020-12-16 | Discharge: 2020-12-16 | Disposition: A | Discharge status: Home / Self Care | Payer: Medicare Other | Source: Ambulatory Visit | Attending: Pulmonary Disease | Admitting: Pulmonary Disease

## 2020-12-16 ENCOUNTER — Other Ambulatory Visit: Payer: Self-pay

## 2020-12-16 DIAGNOSIS — R942 Abnormal results of pulmonary function studies: Secondary | ICD-10-CM

## 2020-12-16 DIAGNOSIS — R053 Chronic cough: Secondary | ICD-10-CM

## 2020-12-16 DIAGNOSIS — R0602 Shortness of breath: Secondary | ICD-10-CM

## 2020-12-16 NOTE — Progress Notes (Signed)
Daily Session Note  Patient Details  Name: Angela Galvan MRN: [id number] Date of Birth: 1932/12/12 Referring Provider:   Flowsheet Row PULMONARY REHAB OTHER RESP ORIENTATION from 10/14/2020 in [place]  Referring Provider Dr. [name]       Encounter Date: 12/16/2020  Check In:  Session Check In - 12/16/20 1500       Check-In   Supervising physician immediately available to respond to emergencies [facility] MD immediately available    Physician(s) Dr. Johnsie Cancel    Location [facility]    Staff Present Hoy Register, MS, ACSM-CEP, Exercise Physiologist;Other    Virtual Visit No    Medication changes reported     No    Fall or balance concerns reported    No    Tobacco Cessation No Change    Warm-up and Cool-down Performed as group-led instruction    Resistance Training Performed Yes    VAD Patient? No    PAD/SET Patient? No      Pain Assessment   Currently in Pain? No/denies    Multiple Pain Sites No             Capillary Blood Glucose: No results found for this or any previous visit (from the past 24 hour(s)).    Social History   Tobacco Use  Smoking Status Never  Smokeless Tobacco Never  Tobacco Comments   Never smoked    Goals Met:  Independence with exercise equipment Exercise tolerated well No report of cardiac concerns or symptoms Strength training completed today  Goals Unmet:  Not Applicable  Comments: checkout time is 1600   Dr. Kathie Dike is Medical Director for [facility] Pulmonary Rehab.

## 2020-12-21 ENCOUNTER — Encounter (HOSPITAL_COMMUNITY)
Admission: RE | Admit: 2020-12-21 | Discharge: 2020-12-21 | Disposition: A | Discharge status: Home / Self Care | Payer: Medicare Other | Source: Ambulatory Visit | Attending: Pulmonary Disease | Admitting: Pulmonary Disease

## 2020-12-21 ENCOUNTER — Other Ambulatory Visit: Payer: Self-pay

## 2020-12-21 DIAGNOSIS — R942 Abnormal results of pulmonary function studies: Secondary | ICD-10-CM

## 2020-12-21 DIAGNOSIS — R0602 Shortness of breath: Secondary | ICD-10-CM | POA: Diagnosis not present

## 2020-12-21 DIAGNOSIS — R053 Chronic cough: Secondary | ICD-10-CM

## 2020-12-21 NOTE — Progress Notes (Signed)
Daily Session Note  Patient Details  Name: Angela Galvan MRN: [id number] Date of Birth: Oct 24, 1932 Referring Provider:   Flowsheet Row PULMONARY REHAB OTHER RESP ORIENTATION from 10/14/2020 in [place]  Referring Provider Dr. [name]       Encounter Date: 12/21/2020  Check In:  Session Check In - 12/21/20 1500       Check-In   Supervising physician immediately available to respond to emergencies [facility] immediately available    Physician(s) Dr. Harl Bowie    Location [facility]    Staff Present Geanie Cooley, RN;Dalton Kris Mouton, MS, ACSM-CEP, Exercise Physiologist    Virtual Visit No    Medication changes reported     No    Fall or balance concerns reported    No    Comments Uses the rollator for balance    Tobacco Cessation No Change    Warm-up and Cool-down Performed as group-led instruction    Resistance Training Performed Yes    PAD/SET Patient? No      Pain Assessment   Currently in Pain? No/denies    Multiple Pain Sites No             Capillary Blood Glucose: No results found for this or any previous visit (from the past 24 hour(s)).    Social History   Tobacco Use  Smoking Status Never  Smokeless Tobacco Never  Tobacco Comments   Never smoked    Goals Met:  Proper associated with RPD/PD & O2 Sat Independence with exercise equipment Exercise tolerated well No report of cardiac concerns or symptoms Strength training completed today  Goals Unmet:  Not Applicable  Comments: check out @ 4:00pm   Dr. Kathie Dike is Medical Director for [facility] Pul[facility] Rehab.

## 2020-12-23 ENCOUNTER — Other Ambulatory Visit: Payer: Self-pay

## 2020-12-23 ENCOUNTER — Encounter (HOSPITAL_COMMUNITY)
Admission: RE | Admit: 2020-12-23 | Discharge: 2020-12-23 | Disposition: A | Discharge status: Home / Self Care | Payer: Medicare Other | Source: Ambulatory Visit | Attending: Pulmonary Disease | Admitting: Pulmonary Disease

## 2020-12-23 DIAGNOSIS — R0602 Shortness of breath: Secondary | ICD-10-CM | POA: Diagnosis not present

## 2020-12-23 DIAGNOSIS — R053 Chronic cough: Secondary | ICD-10-CM

## 2020-12-23 DIAGNOSIS — R942 Abnormal results of pulmonary function studies: Secondary | ICD-10-CM

## 2020-12-23 NOTE — Progress Notes (Signed)
Daily Session Note  Patient Details  Name: Lynnzie B Beidleman MRN: [id number] Date of Birth: [birth year] Referring Provider:   Flowsheet Row PULMONARY REHAB OTHER RESP ORIENTATION from 10/14/2020 in [place]  Referring Provider Dr. [name]       Encounter Date: 12/23/2020  Check In:  Session Check In - 12/23/20 1500       Check-In   Supervising physician immediately available to respond to emergencies [facility] immediately available    Physician(s) Dr. Harl Bowie    Location [facility]    Staff Present Geanie Cooley, RN;Faryn Sieg Kris Mouton, MS, ACSM-CEP, Exercise Physiologist    Virtual Visit No    Medication changes reported     No    Fall or balance concerns reported    No    Tobacco Cessation No Change    Warm-up and Cool-down Performed as group-led instruction    Resistance Training Performed Yes    VAD Patient? No    PAD/SET Patient? No      Pain Assessment   Currently in Pain? No/denies    Multiple Pain Sites No             Capillary Blood Glucose: No results found for this or any previous visit (from the past 24 hour(s)).    Social History   Tobacco Use  Smoking Status Never  Smokeless Tobacco Never  Tobacco Comments   Never smoked    Goals Met:  Independence with exercise equipment Exercise tolerated well No report of cardiac concerns or symptoms Strength training completed today  Goals Unmet:  Not Applicable  Comments: checkout time is 1600   Dr. Kathie Dike is Medical Director for [facility] Re[facility].

## 2020-12-28 ENCOUNTER — Other Ambulatory Visit: Payer: Self-pay

## 2020-12-28 ENCOUNTER — Encounter (HOSPITAL_COMMUNITY)
Admission: RE | Admit: 2020-12-28 | Discharge: 2020-12-28 | Disposition: A | Discharge status: Home / Self Care | Payer: Medicare Other | Source: Ambulatory Visit | Attending: Pulmonary Disease | Admitting: Pulmonary Disease

## 2020-12-28 VITALS — Wt 126.1 lb

## 2020-12-28 DIAGNOSIS — R053 Chronic cough: Secondary | ICD-10-CM | POA: Insufficient documentation

## 2020-12-28 DIAGNOSIS — R942 Abnormal results of pulmonary function studies: Secondary | ICD-10-CM | POA: Insufficient documentation

## 2020-12-28 DIAGNOSIS — R0602 Shortness of breath: Secondary | ICD-10-CM | POA: Insufficient documentation

## 2020-12-28 NOTE — Progress Notes (Signed)
Daily Session Note  Patient Details  Name: Angela Galvan MRN: [id number] Date of Birth: 24-Mar-1933 Referring Provider:   Flowsheet Row PULMONARY REHAB OTHER RESP ORIENTATION from 10/14/2020 in [place]  Referring Provider Dr. [name]       Encounter Date: 12/28/2020  Check In:  Session Check In - 12/28/20 1500       Check-In   Supervising physician immediately available to respond to emergencies [facility] immediately available    Physician(s) Dr. Johnsie Cancel    Location [facility]    Staff Present Geanie Cooley, RN;Moraima Burd Kris Mouton, MS, ACSM-CEP, Exercise Physiologist    Virtual Visit No    Medication changes reported     No    Fall or balance concerns reported    No    Tobacco Cessation No Change    Warm-up and Cool-down Performed as group-led instruction    Resistance Training Performed Yes    VAD Patient? No    PAD/SET Patient? No      Pain Assessment   Currently in Pain? No/denies    Multiple Pain Sites No             Capillary Blood Glucose: No results found for this or any previous visit (from the past 24 hour(s)).    Social History   Tobacco Use  Smoking Status Never  Smokeless Tobacco Never  Tobacco Comments   Never smoked    Goals Met:  Independence with exercise equipment Exercise tolerated well No report of cardiac concerns or symptoms Strength training completed today  Goals Unmet:  Not Applicable  Comments: checkout time is 1600   Dr. Kathie Dike is Medical Director for [facility] Pulmonary Rehab.

## 2020-12-30 ENCOUNTER — Other Ambulatory Visit: Payer: Self-pay

## 2020-12-30 ENCOUNTER — Encounter (HOSPITAL_COMMUNITY)
Admission: RE | Admit: 2020-12-30 | Discharge: 2020-12-30 | Disposition: A | Discharge status: Home / Self Care | Payer: Medicare Other | Source: Ambulatory Visit | Attending: Pulmonary Disease | Admitting: Pulmonary Disease

## 2020-12-30 DIAGNOSIS — R0602 Shortness of breath: Secondary | ICD-10-CM

## 2020-12-30 DIAGNOSIS — R942 Abnormal results of pulmonary function studies: Secondary | ICD-10-CM

## 2020-12-30 DIAGNOSIS — R053 Chronic cough: Secondary | ICD-10-CM

## 2020-12-30 NOTE — Progress Notes (Signed)
Daily Session Note  Patient Details  Name: Angela Galvan MRN: [id number] Date of Birth: 06/18/1932 Referring Provider:   [place] from 10/14/2020 in [place]  Referring Provider Dr. [name]       Encounter Date: 12/30/2020  Check In:  Session Check In - 12/30/20 1456       Check-In   Supervising physician immediately available to respond to emergencies [facility] immediately available    Physician(s) Dr. Domenic Polite    Location [facility]    Staff Present Geanie Cooley, RN;Debra Wynetta Emery, RN, BSN    Virtual Visit No    Medication changes reported     No    Fall or balance concerns reported    No    Comments Uses the rollator for balance    Tobacco Cessation No Change    Warm-up and Cool-down Performed as group-led instruction    Resistance Training Performed Yes    VAD Patient? No    PAD/SET Patient? No      Pain Assessment   Currently in Pain? No/denies    Multiple Pain Sites No             Capillary Blood Glucose: No results found for this or any previous visit (from the past 24 hour(s)).    Social History   Tobacco Use  Smoking Status Never  Smokeless Tobacco Never  Tobacco Comments   Never smoked    Goals Met:  Independence with exercise equipment Using PLB without cueing & demonstrates good technique Exercise tolerated well No report of cardiac concerns or symptoms Strength training completed today  Goals Unmet:  Not Applicable  Comments: check out @ 3:45pm   Dr. Kathie Dike is Medical Director for [facility] Pulmonary Rehab.

## 2021-01-04 ENCOUNTER — Encounter (HOSPITAL_COMMUNITY)
Admission: RE | Admit: 2021-01-04 | Discharge: 2021-01-04 | Disposition: A | Discharge status: Home / Self Care | Payer: Medicare Other | Source: Ambulatory Visit | Attending: Pulmonary Disease | Admitting: Pulmonary Disease

## 2021-01-04 ENCOUNTER — Other Ambulatory Visit: Payer: Self-pay

## 2021-01-04 DIAGNOSIS — R053 Chronic cough: Secondary | ICD-10-CM

## 2021-01-04 DIAGNOSIS — R0602 Shortness of breath: Secondary | ICD-10-CM | POA: Diagnosis not present

## 2021-01-04 DIAGNOSIS — R942 Abnormal results of pulmonary function studies: Secondary | ICD-10-CM

## 2021-01-04 NOTE — Progress Notes (Signed)
Daily Session Note  Patient Details  Name: Nella B Cannan MRN: [id number] Date of Birth: 12-02-1932 Referring Provider:   [place] from 10/14/2020 in [place]  Referring Provider Dr. [name]       Encounter Date: 01/04/2021  Check In:  Session Check In - 01/04/21 1456       Check-In   Supervising physician immediately available to respond to emergencies [facility] immediately available    Physician(s) Dr. Johnsie Cancel    Location [facility]    Staff Present Geanie Cooley, RN;Dalton Kris Mouton, MS, ACSM-CEP, Exercise Physiologist    Virtual Visit No    Medication changes reported     No    Fall or balance concerns reported    Yes    Comments Uses the rollator for balance    Tobacco Cessation No Change    Warm-up and Cool-down Performed as group-led instruction    Resistance Training Performed Yes    VAD Patient? No    PAD/SET Patient? No      Pain Assessment   Currently in Pain? No/denies    Multiple Pain Sites No             Capillary Blood Glucose: No results found for this or any previous visit (from the past 24 hour(s)).    Social History   Tobacco Use  Smoking Status Never  Smokeless Tobacco Never  Tobacco Comments   Never smoked    Goals Met:  Proper associated with RPD/PD & O2 Sat Independence with exercise equipment Exercise tolerated well No report of cardiac concerns or symptoms Strength training completed today  Goals Unmet:  Not Applicable  Comments: check out @ 3:45pm   Dr. Kathie Dike is Medical Director for [facility] Pulmonary Rehab.

## 2021-01-06 ENCOUNTER — Encounter (HOSPITAL_COMMUNITY): Payer: Medicare Other

## 2021-01-11 ENCOUNTER — Other Ambulatory Visit: Payer: Self-pay

## 2021-01-11 ENCOUNTER — Encounter (HOSPITAL_COMMUNITY)
Admission: RE | Admit: 2021-01-11 | Discharge: 2021-01-11 | Disposition: A | Discharge status: Home / Self Care | Payer: Medicare Other | Source: Ambulatory Visit | Attending: Pulmonary Disease | Admitting: Pulmonary Disease

## 2021-01-11 VITALS — Wt 122.4 lb

## 2021-01-11 DIAGNOSIS — R0602 Shortness of breath: Secondary | ICD-10-CM | POA: Diagnosis not present

## 2021-01-11 DIAGNOSIS — R942 Abnormal results of pulmonary function studies: Secondary | ICD-10-CM

## 2021-01-11 DIAGNOSIS — R053 Chronic cough: Secondary | ICD-10-CM

## 2021-01-11 NOTE — Progress Notes (Signed)
Daily Session Note  Patient Details  Name: Angela Galvan MRN: [id number] Date of Birth: 1933-02-28 Referring Provider:   [place] from 10/14/2020 in [place]  Referring Provider Dr. [name]       Encounter Date: 01/11/2021  Check In:  Session Check In - 01/11/21 1457       Check-In   Supervising physician immediately available to respond to emergencies [facility] immediately available    Physician(s) Dr. Domenic Polite    Location [facility]    Staff Present Geanie Cooley, RN;Dalton Kris Mouton, MS, ACSM-CEP, Exercise Physiologist    Virtual Visit No    Medication changes reported     No    Fall or balance concerns reported    Yes    Comments Uses the rollator for balance    Tobacco Cessation No Change    Warm-up and Cool-down Performed as group-led instruction    Resistance Training Performed Yes    VAD Patient? No    PAD/SET Patient? No      Pain Assessment   Currently in Pain? No/denies    Multiple Pain Sites No             Capillary Blood Glucose: No results found for this or any previous visit (from the past 24 hour(s)).    Social History   Tobacco Use  Smoking Status Never  Smokeless Tobacco Never  Tobacco Comments   Never smoked    Goals Met:  Proper associated with RPD/PD & O2 Sat Independence with exercise equipment Exercise tolerated well No report of cardiac concerns or symptoms Strength training completed today  Goals Unmet:  Not Applicable  Comments: check out @ 4:00pm   Dr. Kathie Dike is Medical Director for [facility] Pul[facility] Rehab.

## 2021-01-12 NOTE — Telephone Encounter (Signed)
Margaretha Seeds, MD  Lbpu Triage Pool 22 hours ago (1:02 PM)   Please follow-up on scheduling patient for routine visit   Attempted to call pt's daughter Orlando Penner to get pt scheduled for a f/u with Dr. [name] at her next avail but unable to reach. Left message for her to return call.  Rerouting to front desk pool so they can help out with trying to get pt scheduled for an appt.

## 2021-01-12 NOTE — Progress Notes (Signed)
Pulmonary Individual Treatment Plan  Patient Details  Name: Angela Galvan MRN: [id number] Date of Birth: 03-Mar-1933 Referring Provider:   [place] from 10/14/2020 in [place]  Referring Provider Dr. [name]       Initial Encounter Date:  Flowsheet Row PULMONARY REHAB OTHER RESP ORIENTATION from 10/14/2020 in [place]  Date 10/14/20       Visit Diagnosis: Shortness of breath  Chronic cough  Decreased diffusion capacity  Patient's Home Medications on Admission:   Current Outpatient Medications:    acetaminophen (TYLENOL) 500 MG tablet, Take 500-1,000 mg by mouth every 6 (six) hours as needed for mild pain or moderate pain. Pain , Disp: , Rfl:    albuterol (VENTOLIN HFA) 108 (90 Base) MCG/ACT inhaler, INHALE 2 PUFFS EVERY 6 HOURS AS NEEDED FOR WHEEZING OR SHORTNESS OF BREATH (Patient taking differently: Inhale 2 puffs into the lungs every 6 (six) hours as needed for wheezing or shortness of breath.), Disp: 8.5 g, Rfl: 3   ALPRAZolam (XANAX) 0.5 MG tablet, Take 0.5 mg by mouth 2 (two) times daily as needed for sleep or anxiety., Disp: , Rfl:    aspirin 325 MG tablet, Take 325 mg by mouth daily., Disp: , Rfl:    aspirin EC 81 MG tablet, Take 81 mg by mouth daily. Swallow whole., Disp: , Rfl:    atorvastatin (LIPITOR) 10 MG tablet, Take 1 tablet (10 mg total) by mouth daily after breakfast., Disp: 90 tablet, Rfl: 0   BREO ELLIPTA 200-25 MCG/INH AEPB, Inhale 1 puff into the lungs daily. (Patient not taking: Reported on 09/21/2020), Disp: , Rfl:    clidinium-chlordiazePOXIDE (LIBRAX) 5-2.5 MG capsule, Use once daily in the morning. Further doses as needed up to 4 a day (Patient taking differently: Take 1 capsule by mouth daily as needed (constipation).), Disp: 40 capsule, Rfl: 5   FLUoxetine (PROZAC) 20 MG capsule, TAKE ONE CAPSULE BY MOUTH ONCE DAILY. (Patient taking differently: Take 20 mg by mouth  every morning.), Disp: 30 capsule, Rfl: 0   fluticasone (FLONASE) 50 MCG/ACT nasal spray, Place 1 spray into both nostrils daily. (Patient taking differently: Place 1 spray into both nostrils daily as needed for allergies or rhinitis.), Disp: 16 mL, Rfl: 12   gabapentin (NEURONTIN) 100 MG capsule, Take by mouth., Disp: , Rfl:    gabapentin (NEURONTIN) 300 MG capsule, TAKE 1 CAPSULE BY MOUTH 3 TIMES A DAY AS NEEDED (Patient taking differently: Take 300 mg by mouth at bedtime.), Disp: 90 capsule, Rfl: 3   levothyroxine (SYNTHROID) 75 MCG tablet, Take 1 tablet (75 mcg total) by mouth daily. (Patient taking differently: Take 75 mcg by mouth daily before breakfast.), Disp: 90 tablet, Rfl: 3   levothyroxine (SYNTHROID) 88 MCG tablet, Take 88 mcg by mouth daily before breakfast., Disp: , Rfl:    loperamide (IMODIUM) 2 MG capsule, Take 2 mg by mouth as needed for diarrhea or loose stools., Disp: , Rfl:    loratadine (CLARITIN) 10 MG tablet, Take 10 mg by mouth daily., Disp: , Rfl:    losartan (COZAAR) 100 MG tablet, Take 1 tablet (100 mg total) by mouth daily., Disp: 90 tablet, Rfl: 0   meloxicam (MOBIC) 15 MG tablet, Take 1 tablet by mouth daily., Disp: , Rfl:    RABEprazole (ACIPHEX) 20 MG tablet, TAKE 1 TABLET BY MOUTH 2 TIMES DAILY (Patient taking differently: Take 20 mg by mouth in the morning and at bedtime.), Disp: 180 tablet, Rfl:  2   RESTASIS 0.05 % ophthalmic emulsion, Place 1 drop into both eyes 2 (two) times daily. 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   Grade III, 3.5 cm, ER-/PR- left breast s/p mastectomy with SLN (0/1)    Depression    Diabetes mellitus    no meds.-diet controlled   Diarrhea    Diverticulitis    s/p colectomy in 1991   GERD (gastroesophageal reflux disease)    HOH (hard of hearing)    HTN (hypertension)    Does not see a cardiologist   Hyperlipidemia    Hypothyroidism    IBS (irritable bowel syndrome)    S/P colonoscopy 2003   minimal internal hemorrhoids, pancolonic diverticula, biopsy of rectum: lymphoplasmacytic microscopic colitis, colon biopsies normal   S/P colonoscopy 2012   pancolonic diverticula, nl TI, external hemorrhoidal tag,/rectum bx no abnormalities   S/P endoscopy 2012   normal esophagus, stenotic pyloric channel, TTS dilation   Stroke ([place]) 08/10/2006   no residual    Tobacco Use: Social History   Tobacco Use  Smoking Status Never  Smokeless Tobacco Never  Tobacco Comments   Never smoked    Labs: Recent Review Flowsheet Data     Labs for ITP Cardiac and Pulmonary Rehab Latest Ref Rng & Units 11/04/2016 09/12/2017 01/29/2018 11/08/2018 03/11/2019   Cholestrol 0 - 200 - - 142 - -   LDLCALC - - - 44 - -   HDL 35 - 70 - - 70 - -   Trlycerides 40 - 160 - - 141 - -   Hemoglobin A1c 4.0 - 5.6 % 5.5 6.0 5.9 5.9 6.1(A)       Capillary Blood Glucose: Lab Results  Component Value Date   GLUCAP 175 (H) 12/02/2019   GLUCAP 108 (H) 11/04/2016   GLUCAP 132 (H) 11/03/2016   GLUCAP 107 (H) 11/03/2016   GLUCAP 117 (H) 01/29/2015     Pulmonary Assessment Scores:  Pulmonary Assessment Scores     Row Name 10/14/20 1448         ADL UCSD   SOB Score total 50           CAT Score   CAT Score 21           mMRC Score   mMRC Score 3             UCSD: Self-administered rating of dyspnea associated with activities of daily living (ADLs) 6-point scale (0 = "not at all" to 5 = "maximal or unable to do because of breathlessness")  Scoring Scores range from 0 to 120.  Minimally important difference is 5 units  CAT: CAT can identify the health impairment  of COPD patients and is better correlated with disease progression.  CAT has a scoring range of zero to 40. The CAT score is classified into four groups of low (less than 10), medium (10 - 20), high (21-30) and very high (31-40) based on the impact level of disease on health status. A CAT score over 10 suggests  significant symptoms.  A worsening CAT score could be explained by an exacerbation, poor medication adherence, poor inhaler technique, or progression of COPD or comorbid conditions.  CAT MCID is 2 points  mMRC: mMRC (Modified Medical Research Council) Dyspnea Scale is used to assess the degree of baseline functional disability in patients of respiratory disease due to dyspnea. No minimal important difference is established. A decrease in score of 1 point or greater is considered a positive change.   Pulmonary Function Assessment:   Exercise Target Goals: Exercise Program Goal: Individual exercise prescription set using results from initial 6 min walk test and THRR while considering  patient's activity barriers and safety.   Exercise Prescription Goal: Initial exercise prescription builds to 30-45 minutes a day of aerobic activity, 2-3 days per week.  Home exercise guidelines will be given to patient during program as part of exercise prescription that the participant will acknowledge.  Activity Barriers & Risk Stratification:  Activity Barriers & Cardiac Risk Stratification - 10/14/20 1451       Activity Barriers & Cardiac Risk Stratification   Activity Barriers Arthritis;Left Knee Replacement;Right Knee Replacement;Back Problems;Neck/Spine Problems;Joint Problems;Deconditioning;Shortness of Breath;Balance Concerns;History of Falls;Assistive Device    Cardiac Risk Stratification Moderate             6 Minute Walk:  6 Minute Walk     Row Name 10/14/20 1604         6 Minute Walk   Phase Initial     Distance 800 feet     Walk Time 6 minutes     # of Rest Breaks 1      MPH 1.52     METS 1.13     RPE 13     Perceived Dyspnea  15     VO2 Peak 3.96     Symptoms Yes (comment)     Comments 1 seated rest break due to shortness of breath     Resting HR 76 bpm     Resting BP 122/56     Resting Oxygen Saturation  95 %     Exercise Oxygen Saturation  during 6 min walk 93 %     Max Ex. HR 102 bpm     Max Ex. BP 124/52     2 Minute Post BP 116/60           Interval HR   1 Minute HR 94     2 Minute HR 100     3 Minute HR 102     4 Minute HR 100     5 Minute HR 101     6 Minute HR 102     2 Minute Post HR 83     Interval Heart Rate? Yes           Interval Oxygen   Interval Oxygen? Yes     Baseline Oxygen Saturation % 95 %     1 Minute Oxygen Saturation % 93 %     1 Minute Liters of Oxygen 0 L     2 Minute Oxygen Saturation % 94 %     2 Minute Liters of Oxygen 0 L     3 Minute Oxygen Saturation % 95 %     3 Minute Liters of Oxygen 0 L     4 Minute Oxygen Saturation % 95 %     4 Minute Liters of Oxygen 0 L     5 Minute Oxygen Saturation % 95 %  5 Minute Liters of Oxygen 0 L     6 Minute Oxygen Saturation % 96 %     6 Minute Liters of Oxygen 0 L     2 Minute Post Oxygen Saturation % 96 %     2 Minute Post Liters of Oxygen 0 L              Oxygen Initial Assessment:  Oxygen Initial Assessment - 10/14/20 1609       Initial 6 min Walk   Oxygen Used None      Program Oxygen Prescription   Program Oxygen Prescription None      Intervention   Short Term Goals To learn and exhibit compliance with exercise, home and travel O2 prescription;To learn and understand importance of monitoring SPO2 with pulse oximeter and demonstrate accurate use of the pulse oximeter.;To learn and understand importance of maintaining oxygen saturations>88%;To learn and demonstrate proper pursed lip breathing techniques or other breathing techniques. ;To learn and demonstrate proper use of respiratory medications    Long  Term Goals Exhibits compliance with  exercise, home  and travel O2 prescription;Verbalizes importance of monitoring SPO2 with pulse oximeter and return demonstration;Maintenance of O2 saturations>88%;Exhibits proper breathing techniques, such as pursed lip breathing or other method taught during program session;Compliance with respiratory medication             Oxygen Re-Evaluation:  Oxygen Re-Evaluation     Row Name 10/27/20 0754 11/17/20 0710 12/14/20 1557 01/11/21 1559       Program Oxygen Prescription   Program Oxygen Prescription None None None None         Home Oxygen   Home Oxygen Device None None None None    Sleep Oxygen Prescription None None None None    Home Exercise Oxygen Prescription None None None None    Home Resting Oxygen Prescription None None None None    Compliance with Home Oxygen Use Yes Yes Yes Yes         Goals/Expected Outcomes   Short Term Goals To learn and exhibit compliance with exercise, home and travel O2 prescription;To learn and understand importance of monitoring SPO2 with pulse oximeter and demonstrate accurate use of the pulse oximeter.;To learn and understand importance of maintaining oxygen saturations>88%;To learn and demonstrate proper pursed lip breathing techniques or other breathing techniques. ;To learn and demonstrate proper use of respiratory medications To learn and exhibit compliance with exercise, home and travel O2 prescription;To learn and understand importance of monitoring SPO2 with pulse oximeter and demonstrate accurate use of the pulse oximeter.;To learn and understand importance of maintaining oxygen saturations>88%;To learn and demonstrate proper pursed lip breathing techniques or other breathing techniques. ;To learn and demonstrate proper use of respiratory medications To learn and exhibit compliance with exercise, home and travel O2 prescription;To learn and understand importance of monitoring SPO2 with pulse oximeter and demonstrate accurate use of the pulse  oximeter.;To learn and understand importance of maintaining oxygen saturations>88%;To learn and demonstrate proper pursed lip breathing techniques or other breathing techniques. ;To learn and demonstrate proper use of respiratory medications To learn and exhibit compliance with exercise, home and travel O2 prescription;To learn and understand importance of monitoring SPO2 with pulse oximeter and demonstrate accurate use of the pulse oximeter.;To learn and understand importance of maintaining oxygen saturations>88%;To learn and demonstrate proper pursed lip breathing techniques or other breathing techniques. ;To learn and demonstrate proper use of respiratory medications    Long  Term Goals Exhibits compliance with exercise, home  and travel O2 prescription;Verbalizes importance of monitoring SPO2 with pulse oximeter and return demonstration;Maintenance of O2 saturations>88%;Exhibits proper breathing techniques, such as pursed lip breathing or other method taught during program session;Compliance with respiratory medication Exhibits compliance with exercise, home  and travel O2 prescription;Verbalizes importance of monitoring SPO2 with pulse oximeter and return demonstration;Maintenance of O2 saturations>88%;Exhibits proper breathing techniques, such as pursed lip breathing or other method taught during program session;Compliance with respiratory medication Exhibits compliance with exercise, home  and travel O2 prescription;Verbalizes importance of monitoring SPO2 with pulse oximeter and return demonstration;Maintenance of O2 saturations>88%;Exhibits proper breathing techniques, such as pursed lip breathing or other method taught during program session;Compliance with respiratory medication Exhibits compliance with exercise, home  and travel O2 prescription;Verbalizes importance of monitoring SPO2 with pulse oximeter and return demonstration;Maintenance of O2 saturations>88%;Exhibits proper breathing techniques,  such as pursed lip breathing or other method taught during program session;Compliance with respiratory medication    Goals/Expected Outcomes Compliance Compliance Compliance Compliance             Oxygen Discharge (Final Oxygen Re-Evaluation):  Oxygen Re-Evaluation - 01/11/21 1559       Program Oxygen Prescription   Program Oxygen Prescription None      Home Oxygen   Home Oxygen Device None    Sleep Oxygen Prescription None    Home Exercise Oxygen Prescription None    Home Resting Oxygen Prescription None    Compliance with Home Oxygen Use Yes      Goals/Expected Outcomes   Short Term Goals To learn and exhibit compliance with exercise, home and travel O2 prescription;To learn and understand importance of monitoring SPO2 with pulse oximeter and demonstrate accurate use of the pulse oximeter.;To learn and understand importance of maintaining oxygen saturations>88%;To learn and demonstrate proper pursed lip breathing techniques or other breathing techniques. ;To learn and demonstrate proper use of respiratory medications    Long  Term Goals Exhibits compliance with exercise, home  and travel O2 prescription;Verbalizes importance of monitoring SPO2 with pulse oximeter and return demonstration;Maintenance of O2 saturations>88%;Exhibits proper breathing techniques, such as pursed lip breathing or other method taught during program session;Compliance with respiratory medication    Goals/Expected Outcomes Compliance             Initial Exercise Prescription:  Initial Exercise Prescription - 10/14/20 1600       Date of Initial Exercise RX and Referring Provider   Date 10/14/20    Referring Provider Dr. [name]    Expected Discharge Date 02/17/21      NuStep   Level 1    SPM 60    Minutes 39      Prescription Details   Frequency (times per week) 2    Duration Progress to 30 minutes of continuous aerobic without signs/symptoms of physical distress      Intensity   THRR  40-80% of Max Heartrate 54-107    Ratings of Perceived Exertion 11-13    Perceived Dyspnea 0-4      Resistance Training   Training Prescription Yes    Weight 1 lbs    Reps 10-15             Perform Capillary Blood Glucose checks as needed.  Exercise Prescription Changes:   Exercise Prescription Changes     Row Name 10/26/20 0749 11/16/20 1600 11/30/20 1500 12/14/20 1500 12/28/20 1600     Response to Exercise   Blood Pressure (Admit) 130/58 148/62 128/58 112/58 124/52   Blood Pressure (Exercise) 140/62 158/68  142/60 148/60 136/54   Blood Pressure (Exit) 130/62 136/68 122/58 124/66 110/56   Heart Rate (Admit) 89 bpm 88 bpm 88 bpm 80 bpm 85 bpm   Heart Rate (Exercise) 95 bpm 86 bpm 90 bpm 95 bpm 89 bpm   Heart Rate (Exit) 83 bpm 76 bpm 81 bpm 82 bpm 80 bpm   Oxygen Saturation (Admit) 96 % 97 % 96 % 97 % 96 %   Oxygen Saturation (Exercise) 95 % 95 % 95 % 96 % 95 %   Oxygen Saturation (Exit) 94 % 95 % 94 % 95 % 96 %   Rating of Perceived Exertion (Exercise) '13 11 12 11 12   '$ Perceived Dyspnea (Exercise) '13 11 12 13 12   '$ Duration Continue with 30 min of aerobic exercise without signs/symptoms of physical distress. Continue with 30 min of aerobic exercise without signs/symptoms of physical distress. Continue with 30 min of aerobic exercise without signs/symptoms of physical distress. Continue with 30 min of aerobic exercise without signs/symptoms of physical distress. Continue with 30 min of aerobic exercise without signs/symptoms of physical distress.   Intensity THRR unchanged THRR unchanged THRR unchanged THRR unchanged THRR unchanged     Progression   Progression Continue to progress workloads to maintain intensity without signs/symptoms of physical distress. Continue to progress workloads to maintain intensity without signs/symptoms of physical distress. Continue to progress workloads to maintain intensity without signs/symptoms of physical distress. Continue to progress workloads  to maintain intensity without signs/symptoms of physical distress. Continue to progress workloads to maintain intensity without signs/symptoms of physical distress.     Resistance Training   Training Prescription Yes Yes Yes Yes Yes   Weight 1 lbs 1 lbs 2 lbs 2 lbs 2 lbs   Reps 10-15 10-15 10-15 10-15 10-15   Time 10 Minutes 10 Minutes 10 Minutes 10 Minutes 10 Minutes     NuStep   Level '1 1 1 1 1   '$ SPM 57 74 84 93 99   Minutes 39 39 39 39 39   METs 1.8 1.7 1.8 1.8 1.8    Row Name 01/11/21 1600             Response to Exercise   Blood Pressure (Admit) 122/58       Blood Pressure (Exercise) 132/60       Blood Pressure (Exit) 128/60       Heart Rate (Admit) 78 bpm       Heart Rate (Exercise) 90 bpm       Heart Rate (Exit) 80 bpm       Oxygen Saturation (Admit) 97 %       Oxygen Saturation (Exercise) 95 %       Oxygen Saturation (Exit) 97 %       Rating of Perceived Exertion (Exercise) 12       Perceived Dyspnea (Exercise) 12       Duration Continue with 30 min of aerobic exercise without signs/symptoms of physical distress.       Intensity THRR unchanged               Progression   Progression Continue to progress workloads to maintain intensity without signs/symptoms of physical distress.               Resistance Training   Training Prescription Yes       Weight 2 lbs       Reps 10-15       Time 10 Minutes  NuStep   Level 1       SPM 91       Minutes 39       METs 2                Exercise Comments:   Exercise Comments     Row Name 10/19/20 1600           Exercise Comments Patient completed first exercise session. She tolerated exercise well. She complained of leg pain about halfway through. This is probably due to fatigue from deconditioning. She took frequent breaks and is hopeful it will get better. She wants to decrease her shortness of breath in this program. She is very nice and she looks forward to coming back to rehab.                 Exercise Goals and Review:   Exercise Goals     Row Name 10/14/20 1608 10/27/20 0750 11/17/20 0712 12/14/20 1558 01/11/21 1601     Exercise Goals   Increase Physical Activity Yes Yes Yes Yes Yes   Intervention Provide advice, education, support and counseling about physical activity/exercise needs.;Develop an individualized exercise prescription for aerobic and resistive training based on initial evaluation findings, risk stratification, comorbidities and participant's personal goals. Provide advice, education, support and counseling about physical activity/exercise needs.;Develop an individualized exercise prescription for aerobic and resistive training based on initial evaluation findings, risk stratification, comorbidities and participant's personal goals. Provide advice, education, support and counseling about physical activity/exercise needs.;Develop an individualized exercise prescription for aerobic and resistive training based on initial evaluation findings, risk stratification, comorbidities and participant's personal goals. Provide advice, education, support and counseling about physical activity/exercise needs.;Develop an individualized exercise prescription for aerobic and resistive training based on initial evaluation findings, risk stratification, comorbidities and participant's personal goals. Provide advice, education, support and counseling about physical activity/exercise needs.;Develop an individualized exercise prescription for aerobic and resistive training based on initial evaluation findings, risk stratification, comorbidities and participant's personal goals.   Expected Outcomes Short Term: Attend rehab on a regular basis to increase amount of physical activity.;Long Term: Add in home exercise to make exercise part of routine and to increase amount of physical activity.;Long Term: Exercising regularly at least 3-5 days a week. Short Term: Attend rehab on a regular basis to  increase amount of physical activity.;Long Term: Add in home exercise to make exercise part of routine and to increase amount of physical activity.;Long Term: Exercising regularly at least 3-5 days a week. Short Term: Attend rehab on a regular basis to increase amount of physical activity.;Long Term: Add in home exercise to make exercise part of routine and to increase amount of physical activity.;Long Term: Exercising regularly at least 3-5 days a week. Short Term: Attend rehab on a regular basis to increase amount of physical activity.;Long Term: Add in home exercise to make exercise part of routine and to increase amount of physical activity.;Long Term: Exercising regularly at least 3-5 days a week. Short Term: Attend rehab on a regular basis to increase amount of physical activity.;Long Term: Add in home exercise to make exercise part of routine and to increase amount of physical activity.;Long Term: Exercising regularly at least 3-5 days a week.   Increase Strength and Stamina Yes Yes Yes Yes Yes   Intervention Provide advice, education, support and counseling about physical activity/exercise needs.;Develop an individualized exercise prescription for aerobic and resistive training based on initial evaluation findings, risk stratification, comorbidities and participant's personal goals. Provide  advice, education, support and counseling about physical activity/exercise needs.;Develop an individualized exercise prescription for aerobic and resistive training based on initial evaluation findings, risk stratification, comorbidities and participant's personal goals. Provide advice, education, support and counseling about physical activity/exercise needs.;Develop an individualized exercise prescription for aerobic and resistive training based on initial evaluation findings, risk stratification, comorbidities and participant's personal goals. Provide advice, education, support and counseling about physical  activity/exercise needs.;Develop an individualized exercise prescription for aerobic and resistive training based on initial evaluation findings, risk stratification, comorbidities and participant's personal goals. Provide advice, education, support and counseling about physical activity/exercise needs.;Develop an individualized exercise prescription for aerobic and resistive training based on initial evaluation findings, risk stratification, comorbidities and participant's personal goals.   Expected Outcomes Short Term: Increase workloads from initial exercise prescription for resistance, speed, and METs.;Short Term: Perform resistance training exercises routinely during rehab and add in resistance training at home;Long Term: Improve cardiorespiratory fitness, muscular endurance and strength as measured by increased METs and functional capacity (6MWT) Short Term: Increase workloads from initial exercise prescription for resistance, speed, and METs.;Short Term: Perform resistance training exercises routinely during rehab and add in resistance training at home;Long Term: Improve cardiorespiratory fitness, muscular endurance and strength as measured by increased METs and functional capacity (6MWT) Short Term: Increase workloads from initial exercise prescription for resistance, speed, and METs.;Short Term: Perform resistance training exercises routinely during rehab and add in resistance training at home;Long Term: Improve cardiorespiratory fitness, muscular endurance and strength as measured by increased METs and functional capacity (6MWT) Short Term: Increase workloads from initial exercise prescription for resistance, speed, and METs.;Short Term: Perform resistance training exercises routinely during rehab and add in resistance training at home;Long Term: Improve cardiorespiratory fitness, muscular endurance and strength as measured by increased METs and functional capacity (6MWT) Short Term: Increase workloads  from initial exercise prescription for resistance, speed, and METs.;Short Term: Perform resistance training exercises routinely during rehab and add in resistance training at home;Long Term: Improve cardiorespiratory fitness, muscular endurance and strength as measured by increased METs and functional capacity (6MWT)   Able to understand and use rate of perceived exertion (RPE) scale Yes Yes Yes Yes Yes   Intervention Provide education and explanation on how to use RPE scale Provide education and explanation on how to use RPE scale Provide education and explanation on how to use RPE scale Provide education and explanation on how to use RPE scale Provide education and explanation on how to use RPE scale   Expected Outcomes Short Term: Able to use RPE daily in rehab to express subjective intensity level;Long Term:  Able to use RPE to guide intensity level when exercising independently Short Term: Able to use RPE daily in rehab to express subjective intensity level;Long Term:  Able to use RPE to guide intensity level when exercising independently Short Term: Able to use RPE daily in rehab to express subjective intensity level;Long Term:  Able to use RPE to guide intensity level when exercising independently Short Term: Able to use RPE daily in rehab to express subjective intensity level;Long Term:  Able to use RPE to guide intensity level when exercising independently Short Term: Able to use RPE daily in rehab to express subjective intensity level;Long Term:  Able to use RPE to guide intensity level when exercising independently   Able to understand and use Dyspnea scale Yes Yes Yes Yes Yes   Intervention Provide education and explanation on how to use Dyspnea scale Provide education and explanation on how to use Dyspnea  scale Provide education and explanation on how to use Dyspnea scale Provide education and explanation on how to use Dyspnea scale Provide education and explanation on how to use Dyspnea scale    Expected Outcomes Short Term: Able to use Dyspnea scale daily in rehab to express subjective sense of shortness of breath during exertion;Long Term: Able to use Dyspnea scale to guide intensity level when exercising independently Short Term: Able to use Dyspnea scale daily in rehab to express subjective sense of shortness of breath during exertion;Long Term: Able to use Dyspnea scale to guide intensity level when exercising independently Short Term: Able to use Dyspnea scale daily in rehab to express subjective sense of shortness of breath during exertion;Long Term: Able to use Dyspnea scale to guide intensity level when exercising independently Short Term: Able to use Dyspnea scale daily in rehab to express subjective sense of shortness of breath during exertion;Long Term: Able to use Dyspnea scale to guide intensity level when exercising independently Short Term: Able to use Dyspnea scale daily in rehab to express subjective sense of shortness of breath during exertion;Long Term: Able to use Dyspnea scale to guide intensity level when exercising independently   Knowledge and understanding of Target Heart Rate Range (THRR) Yes Yes Yes Yes Yes   Intervention Provide education and explanation of THRR including how the numbers were predicted and where they are located for reference Provide education and explanation of THRR including how the numbers were predicted and where they are located for reference Provide education and explanation of THRR including how the numbers were predicted and where they are located for reference Provide education and explanation of THRR including how the numbers were predicted and where they are located for reference Provide education and explanation of THRR including how the numbers were predicted and where they are located for reference   Expected Outcomes Short Term: Able to state/look up THRR;Long Term: Able to use THRR to govern intensity when exercising independently;Short Term:  Able to use daily as guideline for intensity in rehab Short Term: Able to state/look up THRR;Long Term: Able to use THRR to govern intensity when exercising independently;Short Term: Able to use daily as guideline for intensity in rehab Short Term: Able to state/look up THRR;Long Term: Able to use THRR to govern intensity when exercising independently;Short Term: Able to use daily as guideline for intensity in rehab Short Term: Able to state/look up THRR;Long Term: Able to use THRR to govern intensity when exercising independently;Short Term: Able to use daily as guideline for intensity in rehab Short Term: Able to state/look up THRR;Long Term: Able to use THRR to govern intensity when exercising independently;Short Term: Able to use daily as guideline for intensity in rehab   Understanding of Exercise Prescription Yes Yes Yes Yes Yes   Intervention Provide education, explanation, and written materials on patient's individual exercise prescription Provide education, explanation, and written materials on patient's individual exercise prescription Provide education, explanation, and written materials on patient's individual exercise prescription Provide education, explanation, and written materials on patient's individual exercise prescription Provide education, explanation, and written materials on patient's individual exercise prescription   Expected Outcomes Short Term: Able to explain program exercise prescription;Long Term: Able to explain home exercise prescription to exercise independently Short Term: Able to explain program exercise prescription;Long Term: Able to explain home exercise prescription to exercise independently Short Term: Able to explain program exercise prescription;Long Term: Able to explain home exercise prescription to exercise independently Short Term: Able to explain program exercise prescription;Long  Term: Able to explain home exercise prescription to exercise independently Short Term:  Able to explain program exercise prescription;Long Term: Able to explain home exercise prescription to exercise independently            Exercise Goals Re-Evaluation :  Exercise Goals Re-Evaluation     Row Name 10/27/20 0751 11/17/20 0713 12/14/20 1558 01/11/21 1602       Exercise Goal Re-Evaluation   Exercise Goals Review Increase Physical Activity;Increase Strength and Stamina;Able to understand and use rate of perceived exertion (RPE) scale;Able to understand and use Dyspnea scale;Knowledge and understanding of Target Heart Rate Range (THRR);Able to check pulse independently;Understanding of Exercise Prescription Increase Physical Activity;Increase Strength and Stamina;Able to understand and use rate of perceived exertion (RPE) scale;Able to understand and use Dyspnea scale;Knowledge and understanding of Target Heart Rate Range (THRR);Able to check pulse independently;Understanding of Exercise Prescription Increase Physical Activity;Increase Strength and Stamina;Able to understand and use rate of perceived exertion (RPE) scale;Able to understand and use Dyspnea scale;Knowledge and understanding of Target Heart Rate Range (THRR);Able to check pulse independently;Understanding of Exercise Prescription Increase Physical Activity;Increase Strength and Stamina;Able to understand and use rate of perceived exertion (RPE) scale;Able to understand and use Dyspnea scale;Knowledge and understanding of Target Heart Rate Range (THRR);Able to check pulse independently;Understanding of Exercise Prescription    Comments Patient has completed 2 execise sessions. She is very deconditioned. She gets very tired out by the warm up. She has complained of muscle soreness and fatigue during exercise as well as the next session. She seems a little discouraged, but I reassured her that it is a lot at first and that she would have to get used to it. She agreed. She is eager about working towards her goals. She is currenlty  exercising at 1.8 METs on the NuStep. Will continue to monitor and progess as able. Pt has completed 8 exercise sessions. She is progressing slowly. She is now able to complete the warm up and is taking less breaks while on the stepper. She enjoys coming to rehab and is motivated to progress. She is currently exercising at 1.7 METs on the stepper. Will continue to monitor and progress as able. Pt has completed 16 exercise sessions. She continues to progress slowly. She is now able to do more sit to stands and her walking looks better. She is taking less breaks while on the stepper. She continues to be motivated. She is currently exercising at 1.8 METs on the stepper. Will continue to monitor and progress as able. Pt has completed 23 exercise sessions. She continues to progress slowly. Her walking continues to slowly improve and she has been able to do more sit to stands. She is now able to tolerate the stepper without taking any breaks. She is currently exercising at 2.0 METs on the stepper. Will continue to monitor and progress as able.    Expected Outcomes Through rehab and exercise at home, patient will achieve their goals. Through rehab and exercise at home, patient will achieve their goals. Through rehab and exercise at home, patient will achieve their goals. Through rehab and exercise at home, patient will achieve their goals.             Discharge Exercise Prescription (Final Exercise Prescription Changes):  Exercise Prescription Changes - 01/11/21 1600       Response to Exercise   Blood Pressure (Admit) 122/58    Blood Pressure (Exercise) 132/60    Blood Pressure (Exit) 128/60    Heart Rate (Admit)  78 bpm    Heart Rate (Exercise) 90 bpm    Heart Rate (Exit) 80 bpm    Oxygen Saturation (Admit) 97 %    Oxygen Saturation (Exercise) 95 %    Oxygen Saturation (Exit) 97 %    Rating of Perceived Exertion (Exercise) 12    Perceived Dyspnea (Exercise) 12    Duration Continue with 30 min of  aerobic exercise without signs/symptoms of physical distress.    Intensity THRR unchanged      Progression   Progression Continue to progress workloads to maintain intensity without signs/symptoms of physical distress.      Resistance Training   Training Prescription Yes    Weight 2 lbs    Reps 10-15    Time 10 Minutes      NuStep   Level 1    SPM 91    Minutes 39    METs 2             Nutrition:  Target Goals: Understanding of nutrition guidelines, daily intake of sodium '1500mg'$ , cholesterol '200mg'$ , calories 30% from fat and 7% or less from saturated fats, daily to have 5 or more servings of fruits and vegetables.  Biometrics:  Pre Biometrics - 10/27/20 0751       Pre Biometrics   Weight 57 kg    BMI (Calculated) 24.54              Nutrition Therapy Plan and Nutrition Goals:  Nutrition Therapy & Goals - 01/03/21 1207       Personal Nutrition Goals   Comments She is in a long term care facility making it difficult to change her diet. Weight remains the same between 56-57kg.      Intervention Plan   Intervention Nutrition handout(s) given to patient.             Nutrition Assessments:  Nutrition Assessments - 10/14/20 1453       MEDFICTS Scores   Pre Score 43            MEDIFICTS Score Key: ?70 Need to make dietary changes  40-70 Heart Healthy Diet ? 40 Therapeutic Level Cholesterol Diet   Picture Your Plate Scores: [id number] Unhealthy dietary pattern with much room for improvement. 41-50 Dietary pattern unlikely to meet recommendations for good health and room for improvement. 51-60 More healthful dietary pattern, with some room for improvement.  >60 Healthy dietary pattern, although there may be some specific behaviors that could be improved.    Nutrition Goals Re-Evaluation:   Nutrition Goals Discharge (Final Nutrition Goals Re-Evaluation):   Psychosocial: Target Goals: Acknowledge presence or absence of significant depression  and/or stress, maximize coping skills, provide positive support system. Participant is able to verbalize types and ability to use techniques and skills needed for reducing stress and depression.  Initial Review & Psychosocial Screening:  Initial Psych Review & Screening - 10/14/20 1452       Initial Review   Current issues with Current Depression      Family Dynamics   Good Support System? Yes    Comments Her daughter, son in law, grand daughter, and cousin all support her.      Barriers   Psychosocial barriers to participate in program The patient should benefit from training in stress management and relaxation.      Screening Interventions   Interventions Encouraged to exercise    Expected Outcomes Long Term goal: The participant improves quality of Life and PHQ9 Scores as seen by post  scores and/or verbalization of changes;Short Term goal: Identification and review with participant of any Quality of Life or Depression concerns found by scoring the questionnaire.             Quality of Life Scores:  Quality of Life - 10/14/20 1557       Quality of Life   Select Quality of Life      Quality of Life Scores   Health/Function Pre 20.4 %    Socioeconomic Pre 30 %    Psych/Spiritual Pre 30 %    Family Pre 30 %    GLOBAL Pre 25.64 %            Scores of 19 and below usually indicate a poorer quality of life in these areas.  A difference of  2-3 points is a clinically meaningful difference.  A difference of 2-3 points in the total score of the Quality of Life Index has been associated with significant improvement in overall quality of life, self-image, physical symptoms, and general health in studies assessing change in quality of life.   PHQ-9: Recent Review Flowsheet Data     Depression screen [facility] 2/9 10/14/2020 03/11/2019   Decreased Interest 3 0   Down, Depressed, Hopeless 3 0   PHQ - 2 Score 6 0   Altered sleeping 3 -   Tired, decreased energy 3 -   Change in  appetite 2 -   Feeling bad or failure about yourself  2 -   Trouble concentrating 1 -   Moving slowly or fidgety/restless 0 -   Suicidal thoughts 0 -   PHQ-9 Score 17 -   Difficult doing work/chores Very difficult -      Interpretation of Total Score  Total Score Depression Severity:  1-4 = Minimal depression, 5-9 = Mild depression, 10-14 = Moderate depression, 15-19 = Moderately severe depression, 20-27 = Severe depression   Psychosocial Evaluation and Intervention:  Psychosocial Evaluation - 10/14/20 1558       Psychosocial Evaluation & Interventions   Interventions Stress management education;Encouraged to exercise with the program and follow exercise prescription    Comments Pt has no barriers to completing pulmonary rehab. The only psychosocial issue that she has is depression. She has been depressed since her husband died in Mar 09, 2019. She is prescribed Zoloft. She reports that she thinks that it helps. Her PHQ-9 was a 17. A lot of this is related to the death of her husband. She also lives in a nursing home. She would like to move back home, but she understands that this will likely never happen due to her frequent falls and not having a caretaker available throughout the day. She states that she copes well and has a strong support system with her daughter, son in law, grand daughter, and her cousin. Her goals in the program are to decrease her shortness of breath and to be able to walk better. Her legs give out a lot causing her to fall. She is hopeful that by coming to rehab, her legs will become stronger, and she will be able to walk better and fall less. She is excited to start the program and will likely benefit from the social aspect of the group class as well.    Expected Outcomes Her PHQ-9 score will decrease.    Continue Psychosocial Services  Follow up required by staff             Psychosocial Re-Evaluation:  Psychosocial Re-Evaluation  Bernie Name 10/20/20  1338 11/10/20 1256 12/09/20 0729 01/03/21 1204       Psychosocial Re-Evaluation   Current issues with Current Depression Current Depression Current Depression Current Depression    Comments Patient is new to the program completing 1 sessions. She continues on Zoloft for management of her depression. We will continue to monitor. Patient has no psychosocial barriers identified. She has completed 6 sessions. She continues on Zoloft for management of her depression. She seems to enjoy coming to class and interacting with other patients and staff. She demonstrates a positive attitude and an interest in improving her health. We will continue to monitor. Patient has no psychosocial barriers identified. She has completed 14 sessions. She continues on Zoloft for management of her depression. She continues to enjoy coming to class and interacting with other patients and staff. She demonstrates a positive attitude and an interest in improving her health. We will continue to monitor. Patient has no psychosocial barriers identified. She has completed 21 sessions. She continues on Zoloft for management of her depression. She continues to enjoy coming to class and interacting with other patients and staff. She demonstrates a positive attitude and an interest in improving her health.   We will continue to monitor.    Expected Outcomes Patient's depression will continue to be managed with Zoloft and she will continue to have no additional psychosocial issues identified. Patient will continue to have no psychosocial barriers identified and her depression will continue to be managed with Zoloft. Patient will continue to have no psychosocial barriers identified and her depression will continue to be managed with Zoloft. Patient will continue to have no psychosocial barriers identified and her depression will continue to be managed with Zoloft.    Interventions Stress management education;Relaxation education;Encouraged to attend  Pulmonary Rehabilitation for the exercise Stress management education;Relaxation education;Encouraged to attend Pulmonary Rehabilitation for the exercise Stress management education;Relaxation education;Encouraged to attend Pulmonary Rehabilitation for the exercise Stress management education;Relaxation education;Encouraged to attend Pulmonary Rehabilitation for the exercise    Continue Psychosocial Services  No Follow up required No Follow up required No Follow up required No Follow up required             Psychosocial Discharge (Final Psychosocial Re-Evaluation):  Psychosocial Re-Evaluation - 01/03/21 1204       Psychosocial Re-Evaluation   Current issues with Current Depression    Comments Patient has no psychosocial barriers identified. She has completed 21 sessions. She continues on Zoloft for management of her depression. She continues to enjoy coming to class and interacting with other patients and staff. She demonstrates a positive attitude and an interest in improving her health.   We will continue to monitor.    Expected Outcomes Patient will continue to have no psychosocial barriers identified and her depression will continue to be managed with Zoloft.    Interventions Stress management education;Relaxation education;Encouraged to attend Pulmonary Rehabilitation for the exercise    Continue Psychosocial Services  No Follow up required              Education: Education Goals: Education classes will be provided on a weekly basis, covering required topics. Participant will state understanding/return demonstration of topics presented.  Learning Barriers/Preferences:  Learning Barriers/Preferences - 10/14/20 1459       Learning Barriers/Preferences   Learning Barriers Hearing   HOH and wears hearing aides   Learning Preferences Written Material;Skilled Demonstration;Individual Instruction             Education Topics:  How Lungs Work and Diseases: - Discuss the  anatomy of the lungs and diseases that can affect the lungs, such as COPD. Flowsheet Row PULMONARY REHAB OTHER RESPIRATORY from 12/30/2020 in [place]  Date 12/02/20  Educator pb  Instruction Review Code 1- Verbalizes Understanding       Exercise: -Discuss the importance of exercise, FITT principles of exercise, normal and abnormal responses to exercise, and how to exercise safely.   Environmental Irritants: -Discuss types of environmental irritants and how to limit exposure to environmental irritants. Flowsheet Row PULMONARY REHAB OTHER RESPIRATORY from 12/30/2020 in [place]  Date 12/09/20  Educator DJ  Instruction Review Code 1- Verbalizes Understanding       Meds/Inhalers and oxygen: - Discuss respiratory medications, definition of an inhaler and oxygen, and the proper way to use an inhaler and oxygen. Flowsheet Row PULMONARY REHAB OTHER RESPIRATORY from 12/30/2020 in [place]  Date 12/16/20  Educator DF       Energy Saving Techniques: - Discuss methods to conserve energy and decrease shortness of breath when performing activities of daily living.  Flowsheet Row PULMONARY REHAB OTHER RESPIRATORY from 12/30/2020 in [place]  Date 12/23/20  Educator DF  Instruction Review Code 2- Demonstrated Understanding       Bronchial Hygiene / Breathing Techniques: - Discuss breathing mechanics, pursed-lip breathing technique,  proper posture, effective ways to clear airways, and other functional breathing techniques Flowsheet Row PULMONARY REHAB OTHER RESPIRATORY from 12/30/2020 in [place]  Date 12/30/20  Educator pb  Instruction Review Code 1- Verbalizes Understanding       Cleaning Equipment: - Provides group verbal and written instruction about the health risks of elevated stress, cause of high stress, and healthy ways to reduce stress.   Nutrition I:  Fats: - Discuss the types of cholesterol, what cholesterol does to the body, and how cholesterol levels can be controlled.   Nutrition II: Labels: -Discuss the different components of food labels and how to read food labels.   Respiratory Infections: - Discuss the signs and symptoms of respiratory infections, ways to prevent respiratory infections, and the importance of seeking medical treatment when having a respiratory infection. Flowsheet Row PULMONARY REHAB OTHER RESPIRATORY from 12/30/2020 in [place]  Date 10/28/20  Educator PB  Instruction Review Code 1- Verbalizes Understanding       Stress I: Signs and Symptoms: - Discuss the causes of stress, how stress may lead to anxiety and depression, and ways to limit stress. Flowsheet Row PULMONARY REHAB OTHER RESPIRATORY from 12/30/2020 in [place]  Date 11/04/20  Educator mk  Instruction Review Code 2- Demonstrated Understanding       Stress II: Relaxation: -Discuss relaxation techniques to limit stress. Flowsheet Row PULMONARY REHAB OTHER RESPIRATORY from 12/30/2020 in [place]  Date 11/10/20  Educator DJ  Instruction Review Code 1- Verbalizes Understanding       Oxygen for Home/Travel: - Discuss how to prepare for travel when on oxygen and proper ways to transport and store oxygen to ensure safety. Flowsheet Row PULMONARY REHAB OTHER RESPIRATORY from 12/30/2020 in [place]  Date 11/18/20  Educator DJ  Instruction Review Code 1- Verbalizes Understanding       Knowledge Questionnaire Score:  Knowledge Questionnaire Score - 10/14/20 1500       Knowledge Questionnaire Score   Pre Score 0/18  Core Components/Risk Factors/Patient Goals at Admission:  Personal Goals and Risk Factors at Admission - 10/14/20 1504       Core Components/Risk Factors/Patient Goals on Admission    Weight Management Yes;Weight  Maintenance    Intervention Weight Management: Develop a combined nutrition and exercise program designed to reach desired caloric intake, while maintaining appropriate intake of nutrient and fiber, sodium and fats, and appropriate energy expenditure required for the weight goal.;Weight Management: Provide education and appropriate resources to help participant work on and attain dietary goals.    Expected Outcomes Short Term: Continue to assess and modify interventions until short term weight is achieved;Long Term: Adherence to nutrition and physical activity/exercise program aimed toward attainment of established weight goal;Weight Maintenance: Understanding of the daily nutrition guidelines, which includes 25-35% calories from fat, 7% or less cal from saturated fats, less than '200mg'$  cholesterol, less than 1.5gm of sodium, & 5 or more servings of fruits and vegetables daily    Improve shortness of breath with ADL's Yes    Intervention Provide education, individualized exercise plan and daily activity instruction to help decrease symptoms of SOB with activities of daily living.    Expected Outcomes Short Term: Improve cardiorespiratory fitness to achieve a reduction of symptoms when performing ADLs;Long Term: Be able to perform more ADLs without symptoms or delay the onset of symptoms    Diabetes Yes    Intervention Provide education about signs/symptoms and action to take for hypo/hyperglycemia.;Provide education about proper nutrition, including hydration, and aerobic/resistive exercise prescription along with prescribed medications to achieve blood glucose in normal ranges: Fasting glucose 65-99 mg/dL    Expected Outcomes Short Term: Participant verbalizes understanding of the signs/symptoms and immediate care of hyper/hypoglycemia, proper foot care and importance of medication, aerobic/resistive exercise and nutrition plan for blood glucose control.;Long Term: Attainment of HbA1C < 7%.    Hypertension  Yes    Intervention Provide education on lifestyle modifcations including regular physical activity/exercise, weight management, moderate sodium restriction and increased consumption of fresh fruit, vegetables, and low fat dairy, alcohol moderation, and smoking cessation.;Monitor prescription use compliance.    Expected Outcomes Short Term: Continued assessment and intervention until BP is < 140/66m HG in hypertensive participants. < 130/876mHG in hypertensive participants with diabetes, heart failure or chronic kidney disease.;Long Term: Maintenance of blood pressure at goal levels.    Lipids Yes    Intervention Provide education and support for participant on nutrition & aerobic/resistive exercise along with prescribed medications to achieve LDL '70mg'$ , HDL >'40mg'$ .    Expected Outcomes Short Term: Participant states understanding of desired cholesterol values and is compliant with medications prescribed. Participant is following exercise prescription and nutrition guidelines.;Long Term: Cholesterol controlled with medications as prescribed, with individualized exercise RX and with personalized nutrition plan. Value goals: LDL < '70mg'$ , HDL > 40 mg.    Personal Goal Other Yes    Personal Goal Improve her walking    Intervention Attend pulmonary rehab and do exercises to strengthen her legs.    Expected Outcomes Her legs will become stronger and she will be able to walk longer distances.             Core Components/Risk Factors/Patient Goals Review:   Goals and Risk Factor Review     Row Name 10/20/20 1339 11/10/20 1258 12/09/20 0729 01/03/21 1210       Core Components/Risk Factors/Patient Goals Review   Personal Goals Review Other Other Other Other    Review Patient referred to pulmonary rehab with  SOB. She has completed 1 sessions. Her personal goals for the program are to increase strength in her legs and improve her gait and balance. We will continue to monitor her progress as she works  towards meeting these goals. Patient has completed 6 sessions losing 2 lbs since last 30 day review. She is doing well in the program with consistent attendance. She exercises on RA with her O2 saturation averging 94-96%. Her blood pressures are well controlled. Her personal goals for the program are to get stronger in her legs and improve her gait and balance. We will continue to monitor her progress as she works towards meeting these goals. Paitent has completed 14 sessions gaining 1 lb since last 30 day review. She continues to do well in the program with consistent attendance. Her blood pressure continues to be controlled and her O2 saturations are running 95-96% on RA during exercise. Her continues to ambulate with a rollator. She has not had any falls since she started the program. Her personal goals for the program are to get stronger in her legs and improve her gait and balance. We will continue to monitor her progress as she works towards meeting these goals. Paitent has completed 21sessions. She continues to do well in the program with consistent attendance. Her blood pressure continues to be controlled and her O2 saturations are running 94-96% on RA during exercise. She continues to ambulate with a rollator. She has not had any falls since she started the program. Her personal goals for the program are to get stronger in her legs and improve her gait and balance. We will continue to monitor her progress as she works towards meeting these goals.    Expected Outcomes Patient will complete the program meeting both personal and program goals. Patient will complete the program meeting both personal and program goals. Patient will complete the program meeting both personal and program goals. Patient will complete the program meeting both personal and program goals.             Core Components/Risk Factors/Patient Goals at Discharge (Final Review):   Goals and Risk Factor Review - 01/03/21 1210        Core Components/Risk Factors/Patient Goals Review   Personal Goals Review Other    Review Paitent has completed 21sessions. She continues to do well in the program with consistent attendance. Her blood pressure continues to be controlled and her O2 saturations are running 94-96% on RA during exercise. She continues to ambulate with a rollator. She has not had any falls since she started the program. Her personal goals for the program are to get stronger in her legs and improve her gait and balance. We will continue to monitor her progress as she works towards meeting these goals.    Expected Outcomes Patient will complete the program meeting both personal and program goals.             ITP Comments:   Comments: ITP REVIEW Pt is making expected progress toward pulmonary rehab goals after completing 24 sessions. Recommend continued exercise, life style modification, education, and utilization of breathing techniques to increase stamina and strength and decrease shortness of breath with exertion.

## 2021-01-13 ENCOUNTER — Encounter (HOSPITAL_COMMUNITY): Payer: Medicare Other

## 2021-01-18 ENCOUNTER — Encounter (HOSPITAL_COMMUNITY): Payer: Medicare Other

## 2021-01-20 ENCOUNTER — Encounter (HOSPITAL_COMMUNITY): Payer: Medicare Other

## 2021-01-25 ENCOUNTER — Encounter (HOSPITAL_COMMUNITY)
Admission: RE | Admit: 2021-01-25 | Discharge: 2021-01-25 | Disposition: A | Discharge status: Home / Self Care | Payer: Medicare Other | Source: Ambulatory Visit | Attending: Pulmonary Disease | Admitting: Pulmonary Disease

## 2021-01-25 ENCOUNTER — Other Ambulatory Visit: Payer: Self-pay

## 2021-01-25 VITALS — Wt 124.3 lb

## 2021-01-25 DIAGNOSIS — R0602 Shortness of breath: Secondary | ICD-10-CM | POA: Diagnosis not present

## 2021-01-25 DIAGNOSIS — R053 Chronic cough: Secondary | ICD-10-CM

## 2021-01-25 DIAGNOSIS — R942 Abnormal results of pulmonary function studies: Secondary | ICD-10-CM

## 2021-01-25 NOTE — Progress Notes (Signed)
Daily Session Note  Patient Details  Name: Jonesha B Coss MRN: [id number] Date of Birth: [birth year] Referring Provider:   [facility] from 10/14/2020 in [place]  Referring Provider Dr. [name]       Encounter Date: 01/25/2021  Check In:  Session Check In - 01/25/21 1445       Check-In   Supervising physician immediately available to respond to emergencies [facility] MD immediately available    Physician(s) Dr. Domenic Polite    Location [facility]    Staff Present Geanie Cooley, RN;Darice Vicario Kris Mouton, MS, ACSM-CEP, Exercise Physiologist    Virtual Visit No    Medication changes reported     No    Fall or balance concerns reported    Yes    Tobacco Cessation No Change    Warm-up and Cool-down Performed as group-led instruction    Resistance Training Performed Yes    VAD Patient? No    PAD/SET Patient? No      Pain Assessment   Currently in Pain? No/denies    Multiple Pain Sites No             Capillary Blood Glucose: No results found for this or any previous visit (from the past 24 hour(s)).    Social History   Tobacco Use  Smoking Status Never  Smokeless Tobacco Never  Tobacco Comments   Never smoked    Goals Met:  Independence with exercise equipment Exercise tolerated well No report of concerns or symptoms today Strength training completed today  Goals Unmet:  Not Applicable  Comments: checkout time is 1400   Dr. Kathie Dike is Medical Director for [facility].

## 2021-01-27 ENCOUNTER — Other Ambulatory Visit: Payer: Self-pay

## 2021-01-27 ENCOUNTER — Encounter (HOSPITAL_COMMUNITY)
Admission: RE | Admit: 2021-01-27 | Discharge: 2021-01-27 | Disposition: A | Discharge status: Home / Self Care | Payer: Medicare Other | Source: Ambulatory Visit | Attending: Pulmonary Disease | Admitting: Pulmonary Disease

## 2021-01-27 DIAGNOSIS — R942 Abnormal results of pulmonary function studies: Secondary | ICD-10-CM | POA: Insufficient documentation

## 2021-01-27 DIAGNOSIS — R0602 Shortness of breath: Secondary | ICD-10-CM | POA: Diagnosis not present

## 2021-01-27 DIAGNOSIS — Z9889 Other specified postprocedural states: Secondary | ICD-10-CM | POA: Diagnosis present

## 2021-01-27 DIAGNOSIS — R053 Chronic cough: Secondary | ICD-10-CM | POA: Insufficient documentation

## 2021-01-27 NOTE — Progress Notes (Signed)
Daily Session Note  Patient Details  Name: Jenee B Malanowski MRN: [id number] Date of Birth: 03/27/1933 Referring Provider:   Dike from 10/14/2020 in [place]  Referring Provider Dr. [name]       Encounter Date: 01/27/2021  Check In:  Session Check In - 01/27/21 1459       Check-In   Supervising physician immediately available to respond to emergencies [facility] MD immediately available    Physician(s) Dr. Harl Bowie    Location [facility]    Staff Present Aundra Dubin, RN, BSN    Virtual Visit No    Medication changes reported     No    Fall or balance concerns reported    Yes    Comments Uses the rollator for balance    Tobacco Cessation No Change    Warm-up and Cool-down Performed as group-led instruction    Resistance Training Performed Yes    VAD Patient? No    PAD/SET Patient? No      Pain Assessment   Currently in Pain? No/denies    Multiple Pain Sites No             Capillary Blood Glucose: No results found for this or any previous visit (from the past 24 hour(s)).    Social History   Tobacco Use  Smoking Status Never  Smokeless Tobacco Never  Tobacco Comments   Never smoked    Goals Met:  Proper associated with RPD/PD & O2 Sat Independence with exercise equipment Using PLB without cueing & demonstrates good technique Exercise tolerated well No report of concerns or symptoms today Strength training completed today  Goals Unmet:  Not Applicable  Comments: Check out 1600.   Dr. Kathie Dike is Medical Director for [facility].

## 2021-02-01 ENCOUNTER — Encounter (HOSPITAL_COMMUNITY)
Admission: RE | Admit: 2021-02-01 | Discharge: 2021-02-01 | Disposition: A | Discharge status: Home / Self Care | Payer: Medicare Other | Source: Ambulatory Visit | Attending: Pulmonary Disease | Admitting: Pulmonary Disease

## 2021-02-01 ENCOUNTER — Other Ambulatory Visit: Payer: Self-pay

## 2021-02-01 DIAGNOSIS — R053 Chronic cough: Secondary | ICD-10-CM

## 2021-02-01 DIAGNOSIS — R942 Abnormal results of pulmonary function studies: Secondary | ICD-10-CM

## 2021-02-01 DIAGNOSIS — R0602 Shortness of breath: Secondary | ICD-10-CM

## 2021-02-01 NOTE — Progress Notes (Signed)
Daily Session Note  Patient Details  Name: Angela Galvan MRN: [id number] Date of Birth: 01-24-1933 Referring Provider:   Flowsheet Row PULMONARY REHAB OTHER RESP ORIENTATION from 10/14/2020 in [place]  Referring Provider Dr. [name]       Encounter Date: 02/01/2021  Check In:  Session Check In - 02/01/21 1330       Check-In   Supervising physician immediately available to respond to emergencies [facility] MD immediately available    Physician(s) Dr. Harrington Challenger    Location [facility]    Staff Present Geanie Cooley, RN;Dalton Kris Mouton, MS, ACSM-CEP, Exercise Physiologist    Virtual Visit No    Medication changes reported     No    Fall or balance concerns reported    Yes    Comments Uses the rollator for balance    Tobacco Cessation No Change    Warm-up and Cool-down Performed as group-led instruction    Resistance Training Performed Yes    VAD Patient? No    PAD/SET Patient? No      Pain Assessment   Currently in Pain? No/denies    Multiple Pain Sites No             Capillary Blood Glucose: No results found for this or any previous visit (from the past 24 hour(s)).    Social History   Tobacco Use  Smoking Status Never  Smokeless Tobacco Never  Tobacco Comments   Never smoked    Goals Met:  Proper associated with RPD/PD & O2 Sat Independence with exercise equipment Improved SOB with ADL's Exercise tolerated well No report of concerns or symptoms today Strength training completed today  Goals Unmet:  Not Applicable  Comments: check out @ 2:30pm   Dr. Kathie Dike is Medical Director for [facility].

## 2021-02-03 ENCOUNTER — Encounter (HOSPITAL_COMMUNITY): Payer: Medicare Other

## 2021-02-08 ENCOUNTER — Other Ambulatory Visit: Payer: Self-pay

## 2021-02-08 ENCOUNTER — Encounter (HOSPITAL_COMMUNITY)
Admission: RE | Admit: 2021-02-08 | Discharge: 2021-02-08 | Disposition: A | Discharge status: Home / Self Care | Payer: Medicare Other | Source: Ambulatory Visit | Attending: Pulmonary Disease | Admitting: Pulmonary Disease

## 2021-02-08 VITALS — Wt 125.2 lb

## 2021-02-08 DIAGNOSIS — R0602 Shortness of breath: Secondary | ICD-10-CM | POA: Diagnosis not present

## 2021-02-08 DIAGNOSIS — R053 Chronic cough: Secondary | ICD-10-CM

## 2021-02-08 DIAGNOSIS — R942 Abnormal results of pulmonary function studies: Secondary | ICD-10-CM

## 2021-02-08 NOTE — Progress Notes (Addendum)
Daily Session Note  Patient Details  Name: Angela Galvan MRN: [id number] Date of Birth: 09/08/1932 Referring Provider:   Flowsheet Row PULMONARY REHAB OTHER RESP ORIENTATION from 10/14/2020 in [place]  Referring Provider Dr. [name]       Encounter Date: 02/08/2021  Check In:  Session Check In - 02/08/21 1500       Check-In   Supervising physician immediately available to respond to emergencies [facility] MD immediately available    Physician(s) Dr. Johnsie Cancel    Location [facility]    Staff Present Hoy Register, MS, ACSM-CEP, Exercise Physiologist;Other    Virtual Visit No    Medication changes reported     No    Fall or balance concerns reported    Yes    Comments Uses the rollator for balance    Tobacco Cessation No Change    Warm-up and Cool-down Performed as group-led instruction    Resistance Training Performed Yes    VAD Patient? No    PAD/SET Patient? No      Pain Assessment   Currently in Pain? No/denies    Multiple Pain Sites No             Capillary Blood Glucose: No results found for this or any previous visit (from the past 24 hour(s)).    Social History   Tobacco Use  Smoking Status Never  Smokeless Tobacco Never  Tobacco Comments   Never smoked    Goals Met:  Independence with exercise equipment Exercise tolerated well No report of concerns or symptoms today Strength training completed today  Goals Unmet:  Not Applicable  Comments: checkout time is 1600   Dr. Kathie Dike is Medical Director for [facility].

## 2021-02-09 NOTE — Progress Notes (Signed)
Pulmonary Individual Treatment Plan  Patient Details  Name: Angela Galvan MRN: [id number] Date of Birth: 03-20-1933 Referring Provider:   [place] from 10/14/2020 in [place]  Referring Provider Dr. [name]       Initial Encounter Date:  Flowsheet Row PULMONARY REHAB OTHER RESP ORIENTATION from 10/14/2020 in [facility]  Date 10/14/20       Visit Diagnosis: Shortness of breath  Chronic cough  Decreased diffusion capacity  Patient's Home Medications on Admission:   Current Outpatient Medications:    acetaminophen (TYLENOL) 500 MG tablet, Take 500-1,000 mg by mouth every 6 (six) hours as needed for mild pain or moderate pain. Pain , Disp: , Rfl:    albuterol (VENTOLIN HFA) 108 (90 Base) MCG/ACT inhaler, INHALE 2 PUFFS EVERY 6 HOURS AS NEEDED FOR WHEEZING OR SHORTNESS OF BREATH (Patient taking differently: Inhale 2 puffs into the lungs every 6 (six) hours as needed for wheezing or shortness of breath.), Disp: 8.5 g, Rfl: 3   ALPRAZolam (XANAX) 0.5 MG tablet, Take 0.5 mg by mouth 2 (two) times daily as needed for sleep or anxiety., Disp: , Rfl:    aspirin 325 MG tablet, Take 325 mg by mouth daily., Disp: , Rfl:    aspirin EC 81 MG tablet, Take 81 mg by mouth daily. Swallow whole., Disp: , Rfl:    atorvastatin (LIPITOR) 10 MG tablet, Take 1 tablet (10 mg total) by mouth daily after breakfast., Disp: 90 tablet, Rfl: 0   BREO ELLIPTA 200-25 MCG/INH AEPB, Inhale 1 puff into the lungs daily. (Patient not taking: Reported on 09/21/2020), Disp: , Rfl:    clidinium-chlordiazePOXIDE (LIBRAX) 5-2.5 MG capsule, Use once daily in the morning. Further doses as needed up to 4 a day (Patient taking differently: Take 1 capsule by mouth daily as needed (constipation).), Disp: 40 capsule, Rfl: 5   FLUoxetine (PROZAC) 20 MG capsule, TAKE ONE CAPSULE BY MOUTH ONCE DAILY. (Patient taking differently: Take 20 mg by mouth  every morning.), Disp: 30 capsule, Rfl: 0   fluticasone (FLONASE) 50 MCG/ACT nasal spray, Place 1 spray into both nostrils daily. (Patient taking differently: Place 1 spray into both nostrils daily as needed for allergies or rhinitis.), Disp: 16 mL, Rfl: 12   gabapentin (NEURONTIN) 100 MG capsule, Take by mouth., Disp: , Rfl:    gabapentin (NEURONTIN) 300 MG capsule, TAKE 1 CAPSULE BY MOUTH 3 TIMES A DAY AS NEEDED (Patient taking differently: Take 300 mg by mouth at bedtime.), Disp: 90 capsule, Rfl: 3   levothyroxine (SYNTHROID) 75 MCG tablet, Take 1 tablet (75 mcg total) by mouth daily. (Patient taking differently: Take 75 mcg by mouth daily before breakfast.), Disp: 90 tablet, Rfl: 3   levothyroxine (SYNTHROID) 88 MCG tablet, Take 88 mcg by mouth daily before breakfast., Disp: , Rfl:    loperamide (IMODIUM) 2 MG capsule, Take 2 mg by mouth as needed for diarrhea or loose stools., Disp: , Rfl:    loratadine (CLARITIN) 10 MG tablet, Take 10 mg by mouth daily., Disp: , Rfl:    losartan (COZAAR) 100 MG tablet, Take 1 tablet (100 mg total) by mouth daily., Disp: 90 tablet, Rfl: 0   meloxicam (MOBIC) 15 MG tablet, Take 1 tablet by mouth daily., Disp: , Rfl:    RABEprazole (ACIPHEX) 20 MG tablet, TAKE 1 TABLET BY MOUTH 2 TIMES DAILY (Patient taking differently: Take 20 mg by mouth in the morning and at bedtime.), Disp: 180 tablet, Rfl:  2   RESTASIS 0.05 % ophthalmic emulsion, Place 1 drop into both eyes 2 (two) times daily. 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   Grade III, 3.5 cm, ER-/PR- left breast s/p mastectomy with SLN (0/1)    Depression    Diabetes mellitus    no meds.-diet controlled   Diarrhea    Diverticulitis    s/p colectomy in 1991   GERD (gastroesophageal reflux disease)    HOH (hard of hearing)    HTN (hypertension)    Does not see a cardiologist   Hyperlipidemia    Hypothyroidism    IBS (irritable bowel syndrome)    S/P colonoscopy 2003   minimal internal hemorrhoids, pancolonic diverticula, biopsy of rectum: lymphoplasmacytic microscopic colitis, colon biopsies normal   S/P colonoscopy 2012   pancolonic diverticula, nl TI, external hemorrhoidal tag,/rectum bx no abnormalities   S/P endoscopy 2012   normal esophagus, stenotic pyloric channel, TTS dilation   Stroke ([place]) 08/10/2006   no residual    Tobacco Use: Social History   Tobacco Use  Smoking Status Never  Smokeless Tobacco Never  Tobacco Comments   Never smoked    Labs: Recent Review Flowsheet Data     Labs for ITP Cardiac and Pulmonary Rehab Latest Ref Rng & Units 11/04/2016 09/12/2017 01/29/2018 11/08/2018 03/11/2019   Cholestrol 0 - 200 - - 142 - -   LDLCALC - - - 44 - -   HDL 35 - 70 - - 70 - -   Trlycerides 40 - 160 - - 141 - -   Hemoglobin A1c 4.0 - 5.6 % 5.5 6.0 5.9 5.9 6.1(A)       Capillary Blood Glucose: Lab Results  Component Value Date   GLUCAP 175 (H) 12/02/2019   GLUCAP 108 (H) 11/04/2016   GLUCAP 132 (H) 11/03/2016   GLUCAP 107 (H) 11/03/2016   GLUCAP 117 (H) 01/29/2015     Pulmonary Assessment Scores:  Pulmonary Assessment Scores     Row Name 10/14/20 1448         ADL UCSD   SOB Score total 50           CAT Score   CAT Score 21           mMRC Score   mMRC Score 3             UCSD: Self-administered rating of dyspnea associated with activities of daily living (ADLs) 6-point scale (0 = "not at all" to 5 = "maximal or unable to do because of breathlessness")  Scoring Scores range from 0 to 120.  Minimally important difference is 5 units  CAT: CAT can identify the health impairment  of COPD patients and is better correlated with disease progression.  CAT has a scoring range of zero to 40. The CAT score is classified into four groups of low (less than 10), medium (10 - 20), high (21-30) and very high (31-40) based on the impact level of disease on health status. A CAT score over 10 suggests  significant symptoms.  A worsening CAT score could be explained by an exacerbation, poor medication adherence, poor inhaler technique, or progression of COPD or comorbid conditions.  CAT MCID is 2 points  mMRC: mMRC (Modified Medical Research Council) Dyspnea Scale is used to assess the degree of baseline functional disability in patients of respiratory disease due to dyspnea. No minimal important difference is established. A decrease in score of 1 point or greater is considered a positive change.   Pulmonary Function Assessment:   Exercise Target Goals: Exercise Program Goal: Individual exercise prescription set using results from initial 6 min walk test and THRR while considering  patient's activity barriers and safety.   Exercise Prescription Goal: Initial exercise prescription builds to 30-45 minutes a day of aerobic activity, 2-3 days per week.  Home exercise guidelines will be given to patient during program as part of exercise prescription that the participant will acknowledge.  Activity Barriers & Risk Stratification:  Activity Barriers & Cardiac Risk Stratification - 10/14/20 1451       Activity Barriers & Cardiac Risk Stratification   Activity Barriers Arthritis;Left Knee Replacement;Right Knee Replacement;Back Problems;Neck/Spine Problems;Joint Problems;Deconditioning;Shortness of Breath;Balance Concerns;History of Falls;Assistive Device    Cardiac Risk Stratification Moderate             6 Minute Walk:  6 Minute Walk     Row Name 10/14/20 1604         6 Minute Walk   Phase Initial     Distance 800 feet     Walk Time 6 minutes     # of Rest Breaks 1      MPH 1.52     METS 1.13     RPE 13     Perceived Dyspnea  15     VO2 Peak 3.96     Symptoms Yes (comment)     Comments 1 seated rest break due to shortness of breath     Resting HR 76 bpm     Resting BP 122/56     Resting Oxygen Saturation  95 %     Exercise Oxygen Saturation  during 6 min walk 93 %     Max Ex. HR 102 bpm     Max Ex. BP 124/52     2 Minute Post BP 116/60           Interval HR   1 Minute HR 94     2 Minute HR 100     3 Minute HR 102     4 Minute HR 100     5 Minute HR 101     6 Minute HR 102     2 Minute Post HR 83     Interval Heart Rate? Yes           Interval Oxygen   Interval Oxygen? Yes     Baseline Oxygen Saturation % 95 %     1 Minute Oxygen Saturation % 93 %     1 Minute Liters of Oxygen 0 L     2 Minute Oxygen Saturation % 94 %     2 Minute Liters of Oxygen 0 L     3 Minute Oxygen Saturation % 95 %     3 Minute Liters of Oxygen 0 L     4 Minute Oxygen Saturation % 95 %     4 Minute Liters of Oxygen 0 L     5 Minute Oxygen Saturation % 95 %  5 Minute Liters of Oxygen 0 L     6 Minute Oxygen Saturation % 96 %     6 Minute Liters of Oxygen 0 L     2 Minute Post Oxygen Saturation % 96 %     2 Minute Post Liters of Oxygen 0 L              Oxygen Initial Assessment:  Oxygen Initial Assessment - 10/14/20 1609       Initial 6 min Walk   Oxygen Used None      Program Oxygen Prescription   Program Oxygen Prescription None      Intervention   Short Term Goals To learn and exhibit compliance with exercise, home and travel O2 prescription;To learn and understand importance of monitoring SPO2 with pulse oximeter and demonstrate accurate use of the pulse oximeter.;To learn and understand importance of maintaining oxygen saturations>88%;To learn and demonstrate proper pursed lip breathing techniques or other breathing techniques. ;To learn and demonstrate proper use of respiratory medications    Long  Term Goals Exhibits compliance with  exercise, home  and travel O2 prescription;Verbalizes importance of monitoring SPO2 with pulse oximeter and return demonstration;Maintenance of O2 saturations>88%;Exhibits proper breathing techniques, such as pursed lip breathing or other method taught during program session;Compliance with respiratory medication             Oxygen Re-Evaluation:  Oxygen Re-Evaluation     Row Name 10/27/20 0754 11/17/20 0710 12/14/20 1557 01/11/21 1559 02/08/21 1647     Program Oxygen Prescription   Program Oxygen Prescription None None None None None     Home Oxygen   Home Oxygen Device None None None None None   Sleep Oxygen Prescription None None None None None   Home Exercise Oxygen Prescription None None None None None   Home Resting Oxygen Prescription None None None None None   Compliance with Home Oxygen Use Yes Yes Yes Yes Yes     Goals/Expected Outcomes   Short Term Goals To learn and exhibit compliance with exercise, home and travel O2 prescription;To learn and understand importance of monitoring SPO2 with pulse oximeter and demonstrate accurate use of the pulse oximeter.;To learn and understand importance of maintaining oxygen saturations>88%;To learn and demonstrate proper pursed lip breathing techniques or other breathing techniques. ;To learn and demonstrate proper use of respiratory medications To learn and exhibit compliance with exercise, home and travel O2 prescription;To learn and understand importance of monitoring SPO2 with pulse oximeter and demonstrate accurate use of the pulse oximeter.;To learn and understand importance of maintaining oxygen saturations>88%;To learn and demonstrate proper pursed lip breathing techniques or other breathing techniques. ;To learn and demonstrate proper use of respiratory medications To learn and exhibit compliance with exercise, home and travel O2 prescription;To learn and understand importance of monitoring SPO2 with pulse oximeter and demonstrate  accurate use of the pulse oximeter.;To learn and understand importance of maintaining oxygen saturations>88%;To learn and demonstrate proper pursed lip breathing techniques or other breathing techniques. ;To learn and demonstrate proper use of respiratory medications To learn and exhibit compliance with exercise, home and travel O2 prescription;To learn and understand importance of monitoring SPO2 with pulse oximeter and demonstrate accurate use of the pulse oximeter.;To learn and understand importance of maintaining oxygen saturations>88%;To learn and demonstrate proper pursed lip breathing techniques or other breathing techniques. ;To learn and demonstrate proper use of respiratory medications To learn and exhibit compliance with exercise, home and travel O2 prescription;To learn and understand importance of monitoring  SPO2 with pulse oximeter and demonstrate accurate use of the pulse oximeter.;To learn and understand importance of maintaining oxygen saturations>88%;To learn and demonstrate proper pursed lip breathing techniques or other breathing techniques. ;To learn and demonstrate proper use of respiratory medications   Long  Term Goals Exhibits compliance with exercise, home  and travel O2 prescription;Verbalizes importance of monitoring SPO2 with pulse oximeter and return demonstration;Maintenance of O2 saturations>88%;Exhibits proper breathing techniques, such as pursed lip breathing or other method taught during program session;Compliance with respiratory medication Exhibits compliance with exercise, home  and travel O2 prescription;Verbalizes importance of monitoring SPO2 with pulse oximeter and return demonstration;Maintenance of O2 saturations>88%;Exhibits proper breathing techniques, such as pursed lip breathing or other method taught during program session;Compliance with respiratory medication Exhibits compliance with exercise, home  and travel O2 prescription;Verbalizes importance of monitoring  SPO2 with pulse oximeter and return demonstration;Maintenance of O2 saturations>88%;Exhibits proper breathing techniques, such as pursed lip breathing or other method taught during program session;Compliance with respiratory medication Exhibits compliance with exercise, home  and travel O2 prescription;Verbalizes importance of monitoring SPO2 with pulse oximeter and return demonstration;Maintenance of O2 saturations>88%;Exhibits proper breathing techniques, such as pursed lip breathing or other method taught during program session;Compliance with respiratory medication Exhibits compliance with exercise, home  and travel O2 prescription;Verbalizes importance of monitoring SPO2 with pulse oximeter and return demonstration;Maintenance of O2 saturations>88%;Exhibits proper breathing techniques, such as pursed lip breathing or other method taught during program session;Compliance with respiratory medication   Goals/Expected Outcomes Compliance Compliance Compliance Compliance Compliance            Oxygen Discharge (Final Oxygen Re-Evaluation):  Oxygen Re-Evaluation - 02/08/21 1647       Program Oxygen Prescription   Program Oxygen Prescription None      Home Oxygen   Home Oxygen Device None    Sleep Oxygen Prescription None    Home Exercise Oxygen Prescription None    Home Resting Oxygen Prescription None    Compliance with Home Oxygen Use Yes      Goals/Expected Outcomes   Short Term Goals To learn and exhibit compliance with exercise, home and travel O2 prescription;To learn and understand importance of monitoring SPO2 with pulse oximeter and demonstrate accurate use of the pulse oximeter.;To learn and understand importance of maintaining oxygen saturations>88%;To learn and demonstrate proper pursed lip breathing techniques or other breathing techniques. ;To learn and demonstrate proper use of respiratory medications    Long  Term Goals Exhibits compliance with exercise, home  and travel O2  prescription;Verbalizes importance of monitoring SPO2 with pulse oximeter and return demonstration;Maintenance of O2 saturations>88%;Exhibits proper breathing techniques, such as pursed lip breathing or other method taught during program session;Compliance with respiratory medication    Goals/Expected Outcomes Compliance             Initial Exercise Prescription:  Initial Exercise Prescription - 10/14/20 1600       Date of Initial Exercise RX and Referring Provider   Date 10/14/20    Referring Provider Dr. [name]    Expected Discharge Date 02/17/21      NuStep   Level 1    SPM 60    Minutes 39      Prescription Details   Frequency (times per week) 2    Duration Progress to 30 minutes of continuous aerobic without signs/symptoms of physical distress      Intensity   THRR 40-80% of Max Heartrate 54-107    Ratings of Perceived Exertion 11-13    Perceived  Dyspnea 0-4      Resistance Training   Training Prescription Yes    Weight 1 lbs    Reps 10-15             Perform Capillary Blood Glucose checks as needed.  Exercise Prescription Changes:   Exercise Prescription Changes     Row Name 10/26/20 0749 11/16/20 1600 11/30/20 1500 12/14/20 1500 12/28/20 1600     Response to Exercise   Blood Pressure (Admit) 130/58 148/62 128/58 112/58 124/52   Blood Pressure (Exercise) 140/62 158/68 142/60 148/60 136/54   Blood Pressure (Exit) 130/62 136/68 122/58 124/66 110/56   Heart Rate (Admit) 89 bpm 88 bpm 88 bpm 80 bpm 85 bpm   Heart Rate (Exercise) 95 bpm 86 bpm 90 bpm 95 bpm 89 bpm   Heart Rate (Exit) 83 bpm 76 bpm 81 bpm 82 bpm 80 bpm   Oxygen Saturation (Admit) 96 % 97 % 96 % 97 % 96 %   Oxygen Saturation (Exercise) 95 % 95 % 95 % 96 % 95 %   Oxygen Saturation (Exit) 94 % 95 % 94 % 95 % 96 %   Rating of Perceived Exertion (Exercise) '13 11 12 11 12   '$ Perceived Dyspnea (Exercise) '13 11 12 13 12   '$ Duration Continue with 30 min of aerobic exercise without signs/symptoms  of physical distress. Continue with 30 min of aerobic exercise without signs/symptoms of physical distress. Continue with 30 min of aerobic exercise without signs/symptoms of physical distress. Continue with 30 min of aerobic exercise without signs/symptoms of physical distress. Continue with 30 min of aerobic exercise without signs/symptoms of physical distress.   Intensity THRR unchanged THRR unchanged THRR unchanged THRR unchanged THRR unchanged     Progression   Progression Continue to progress workloads to maintain intensity without signs/symptoms of physical distress. Continue to progress workloads to maintain intensity without signs/symptoms of physical distress. Continue to progress workloads to maintain intensity without signs/symptoms of physical distress. Continue to progress workloads to maintain intensity without signs/symptoms of physical distress. Continue to progress workloads to maintain intensity without signs/symptoms of physical distress.     Resistance Training   Training Prescription Yes Yes Yes Yes Yes   Weight 1 lbs 1 lbs 2 lbs 2 lbs 2 lbs   Reps 10-15 10-15 10-15 10-15 10-15   Time 10 Minutes 10 Minutes 10 Minutes 10 Minutes 10 Minutes     NuStep   Level '1 1 1 1 1   '$ SPM 57 74 84 93 99   Minutes 39 39 39 39 39   METs 1.8 1.7 1.8 1.8 1.8    Row Name 01/11/21 1600 01/25/21 1500 02/08/21 1600         Response to Exercise   Blood Pressure (Admit) 122/58 130/60 126/62     Blood Pressure (Exercise) 132/60 150/56 140/62     Blood Pressure (Exit) 128/60 132/64 122/60     Heart Rate (Admit) 78 bpm 80 bpm 80 bpm     Heart Rate (Exercise) 90 bpm 90 bpm 86 bpm     Heart Rate (Exit) 80 bpm 82 bpm 77 bpm     Oxygen Saturation (Admit) 97 % 96 % 96 %     Oxygen Saturation (Exercise) 95 % 96 % 95 %     Oxygen Saturation (Exit) 97 % 96 % 98 %     Rating of Perceived Exertion (Exercise) '12 12 11     '$ Perceived Dyspnea (Exercise) 12 12 11  Duration Continue with 30 min of  aerobic exercise without signs/symptoms of physical distress. Continue with 30 min of aerobic exercise without signs/symptoms of physical distress. Continue with 30 min of aerobic exercise without signs/symptoms of physical distress.     Intensity THRR unchanged THRR unchanged THRR unchanged           Progression   Progression Continue to progress workloads to maintain intensity without signs/symptoms of physical distress. Continue to progress workloads to maintain intensity without signs/symptoms of physical distress. Continue to progress workloads to maintain intensity without signs/symptoms of physical distress.           Resistance Training   Training Prescription Yes Yes Yes     Weight 2 lbs 2 lbs 2 lbs     Reps 10-15 10-15 10-15     Time 10 Minutes 10 Minutes 10 Minutes           NuStep   Level '1 1 1     '$ SPM 91 92 93     Minutes 39 39 39     METs 2 2 1.8              Exercise Comments:   Exercise Comments     Row Name 10/19/20 1600           Exercise Comments Patient completed first exercise session. She tolerated exercise well. She complained of leg pain about halfway through. This is probably due to fatigue from deconditioning. She took frequent breaks and is hopeful it will get better. She wants to decrease her shortness of breath in this program. She is very nice and she looks forward to coming back to rehab.                Exercise Goals and Review:   Exercise Goals     Row Name 10/14/20 1608 10/27/20 0750 11/17/20 0712 12/14/20 1558 01/11/21 1601     Exercise Goals   Increase Physical Activity Yes Yes Yes Yes Yes   Intervention Provide advice, education, support and counseling about physical activity/exercise needs.;Develop an individualized exercise prescription for aerobic and resistive training based on initial evaluation findings, risk stratification, comorbidities and participant's personal goals. Provide advice, education, support and counseling  about physical activity/exercise needs.;Develop an individualized exercise prescription for aerobic and resistive training based on initial evaluation findings, risk stratification, comorbidities and participant's personal goals. Provide advice, education, support and counseling about physical activity/exercise needs.;Develop an individualized exercise prescription for aerobic and resistive training based on initial evaluation findings, risk stratification, comorbidities and participant's personal goals. Provide advice, education, support and counseling about physical activity/exercise needs.;Develop an individualized exercise prescription for aerobic and resistive training based on initial evaluation findings, risk stratification, comorbidities and participant's personal goals. Provide advice, education, support and counseling about physical activity/exercise needs.;Develop an individualized exercise prescription for aerobic and resistive training based on initial evaluation findings, risk stratification, comorbidities and participant's personal goals.   Expected Outcomes Short Term: Attend rehab on a regular basis to increase amount of physical activity.;Long Term: Add in home exercise to make exercise part of routine and to increase amount of physical activity.;Long Term: Exercising regularly at least 3-5 days a week. Short Term: Attend rehab on a regular basis to increase amount of physical activity.;Long Term: Add in home exercise to make exercise part of routine and to increase amount of physical activity.;Long Term: Exercising regularly at least 3-5 days a week. Short Term: Attend rehab on a regular basis to increase amount of  physical activity.;Long Term: Add in home exercise to make exercise part of routine and to increase amount of physical activity.;Long Term: Exercising regularly at least 3-5 days a week. Short Term: Attend rehab on a regular basis to increase amount of physical activity.;Long Term: Add  in home exercise to make exercise part of routine and to increase amount of physical activity.;Long Term: Exercising regularly at least 3-5 days a week. Short Term: Attend rehab on a regular basis to increase amount of physical activity.;Long Term: Add in home exercise to make exercise part of routine and to increase amount of physical activity.;Long Term: Exercising regularly at least 3-5 days a week.   Increase Strength and Stamina Yes Yes Yes Yes Yes   Intervention Provide advice, education, support and counseling about physical activity/exercise needs.;Develop an individualized exercise prescription for aerobic and resistive training based on initial evaluation findings, risk stratification, comorbidities and participant's personal goals. Provide advice, education, support and counseling about physical activity/exercise needs.;Develop an individualized exercise prescription for aerobic and resistive training based on initial evaluation findings, risk stratification, comorbidities and participant's personal goals. Provide advice, education, support and counseling about physical activity/exercise needs.;Develop an individualized exercise prescription for aerobic and resistive training based on initial evaluation findings, risk stratification, comorbidities and participant's personal goals. Provide advice, education, support and counseling about physical activity/exercise needs.;Develop an individualized exercise prescription for aerobic and resistive training based on initial evaluation findings, risk stratification, comorbidities and participant's personal goals. Provide advice, education, support and counseling about physical activity/exercise needs.;Develop an individualized exercise prescription for aerobic and resistive training based on initial evaluation findings, risk stratification, comorbidities and participant's personal goals.   Expected Outcomes Short Term: Increase workloads from initial exercise  prescription for resistance, speed, and METs.;Short Term: Perform resistance training exercises routinely during rehab and add in resistance training at home;Long Term: Improve cardiorespiratory fitness, muscular endurance and strength as measured by increased METs and functional capacity (6MWT) Short Term: Increase workloads from initial exercise prescription for resistance, speed, and METs.;Short Term: Perform resistance training exercises routinely during rehab and add in resistance training at home;Long Term: Improve cardiorespiratory fitness, muscular endurance and strength as measured by increased METs and functional capacity (6MWT) Short Term: Increase workloads from initial exercise prescription for resistance, speed, and METs.;Short Term: Perform resistance training exercises routinely during rehab and add in resistance training at home;Long Term: Improve cardiorespiratory fitness, muscular endurance and strength as measured by increased METs and functional capacity (6MWT) Short Term: Increase workloads from initial exercise prescription for resistance, speed, and METs.;Short Term: Perform resistance training exercises routinely during rehab and add in resistance training at home;Long Term: Improve cardiorespiratory fitness, muscular endurance and strength as measured by increased METs and functional capacity (6MWT) Short Term: Increase workloads from initial exercise prescription for resistance, speed, and METs.;Short Term: Perform resistance training exercises routinely during rehab and add in resistance training at home;Long Term: Improve cardiorespiratory fitness, muscular endurance and strength as measured by increased METs and functional capacity (6MWT)   Able to understand and use rate of perceived exertion (RPE) scale Yes Yes Yes Yes Yes   Intervention Provide education and explanation on how to use RPE scale Provide education and explanation on how to use RPE scale Provide education and  explanation on how to use RPE scale Provide education and explanation on how to use RPE scale Provide education and explanation on how to use RPE scale   Expected Outcomes Short Term: Able to use RPE daily in rehab to express subjective intensity  level;Long Term:  Able to use RPE to guide intensity level when exercising independently Short Term: Able to use RPE daily in rehab to express subjective intensity level;Long Term:  Able to use RPE to guide intensity level when exercising independently Short Term: Able to use RPE daily in rehab to express subjective intensity level;Long Term:  Able to use RPE to guide intensity level when exercising independently Short Term: Able to use RPE daily in rehab to express subjective intensity level;Long Term:  Able to use RPE to guide intensity level when exercising independently Short Term: Able to use RPE daily in rehab to express subjective intensity level;Long Term:  Able to use RPE to guide intensity level when exercising independently   Able to understand and use Dyspnea scale Yes Yes Yes Yes Yes   Intervention Provide education and explanation on how to use Dyspnea scale Provide education and explanation on how to use Dyspnea scale Provide education and explanation on how to use Dyspnea scale Provide education and explanation on how to use Dyspnea scale Provide education and explanation on how to use Dyspnea scale   Expected Outcomes Short Term: Able to use Dyspnea scale daily in rehab to express subjective sense of shortness of breath during exertion;Long Term: Able to use Dyspnea scale to guide intensity level when exercising independently Short Term: Able to use Dyspnea scale daily in rehab to express subjective sense of shortness of breath during exertion;Long Term: Able to use Dyspnea scale to guide intensity level when exercising independently Short Term: Able to use Dyspnea scale daily in rehab to express subjective sense of shortness of breath during  exertion;Long Term: Able to use Dyspnea scale to guide intensity level when exercising independently Short Term: Able to use Dyspnea scale daily in rehab to express subjective sense of shortness of breath during exertion;Long Term: Able to use Dyspnea scale to guide intensity level when exercising independently Short Term: Able to use Dyspnea scale daily in rehab to express subjective sense of shortness of breath during exertion;Long Term: Able to use Dyspnea scale to guide intensity level when exercising independently   Knowledge and understanding of Target Heart Rate Range (THRR) Yes Yes Yes Yes Yes   Intervention Provide education and explanation of THRR including how the numbers were predicted and where they are located for reference Provide education and explanation of THRR including how the numbers were predicted and where they are located for reference Provide education and explanation of THRR including how the numbers were predicted and where they are located for reference Provide education and explanation of THRR including how the numbers were predicted and where they are located for reference Provide education and explanation of THRR including how the numbers were predicted and where they are located for reference   Expected Outcomes Short Term: Able to state/look up THRR;Long Term: Able to use THRR to govern intensity when exercising independently;Short Term: Able to use daily as guideline for intensity in rehab Short Term: Able to state/look up THRR;Long Term: Able to use THRR to govern intensity when exercising independently;Short Term: Able to use daily as guideline for intensity in rehab Short Term: Able to state/look up THRR;Long Term: Able to use THRR to govern intensity when exercising independently;Short Term: Able to use daily as guideline for intensity in rehab Short Term: Able to state/look up THRR;Long Term: Able to use THRR to govern intensity when exercising independently;Short Term: Able  to use daily as guideline for intensity in rehab Short Term: Able to state/look up  THRR;Long Term: Able to use THRR to govern intensity when exercising independently;Short Term: Able to use daily as guideline for intensity in rehab   Understanding of Exercise Prescription Yes Yes Yes Yes Yes   Intervention Provide education, explanation, and written materials on patient's individual exercise prescription Provide education, explanation, and written materials on patient's individual exercise prescription Provide education, explanation, and written materials on patient's individual exercise prescription Provide education, explanation, and written materials on patient's individual exercise prescription Provide education, explanation, and written materials on patient's individual exercise prescription   Expected Outcomes Short Term: Able to explain program exercise prescription;Long Term: Able to explain home exercise prescription to exercise independently Short Term: Able to explain program exercise prescription;Long Term: Able to explain home exercise prescription to exercise independently Short Term: Able to explain program exercise prescription;Long Term: Able to explain home exercise prescription to exercise independently Short Term: Able to explain program exercise prescription;Long Term: Able to explain home exercise prescription to exercise independently Short Term: Able to explain program exercise prescription;Long Term: Able to explain home exercise prescription to exercise independently    [place] Name 02/08/21 1649             Exercise Goals   Increase Physical Activity Yes       Intervention Provide advice, education, support and counseling about physical activity/exercise needs.;Develop an individualized exercise prescription for aerobic and resistive training based on initial evaluation findings, risk stratification, comorbidities and participant's personal goals.       Expected Outcomes Short  Term: Attend rehab on a regular basis to increase amount of physical activity.;Long Term: Add in home exercise to make exercise part of routine and to increase amount of physical activity.;Long Term: Exercising regularly at least 3-5 days a week.       Increase Strength and Stamina Yes       Intervention Provide advice, education, support and counseling about physical activity/exercise needs.;Develop an individualized exercise prescription for aerobic and resistive training based on initial evaluation findings, risk stratification, comorbidities and participant's personal goals.       Expected Outcomes Short Term: Increase workloads from initial exercise prescription for resistance, speed, and METs.;Short Term: Perform resistance training exercises routinely during rehab and add in resistance training at home;Long Term: Improve cardiorespiratory fitness, muscular endurance and strength as measured by increased METs and functional capacity (6MWT)       Able to understand and use rate of perceived exertion (RPE) scale Yes       Intervention Provide education and explanation on how to use RPE scale       Expected Outcomes Short Term: Able to use RPE daily in rehab to express subjective intensity level;Long Term:  Able to use RPE to guide intensity level when exercising independently       Able to understand and use Dyspnea scale Yes       Intervention Provide education and explanation on how to use Dyspnea scale       Expected Outcomes Short Term: Able to use Dyspnea scale daily in rehab to express subjective sense of shortness of breath during exertion;Long Term: Able to use Dyspnea scale to guide intensity level when exercising independently       Knowledge and understanding of Target Heart Rate Range (THRR) Yes       Intervention Provide education and explanation of THRR including how the numbers were predicted and where they are located for reference       Expected Outcomes Short Term: Able to  state/look up THRR;Long Term: Able to use THRR to govern intensity when exercising independently;Short Term: Able to use daily as guideline for intensity in rehab       Understanding of Exercise Prescription Yes       Intervention Provide education, explanation, and written materials on patient's individual exercise prescription       Expected Outcomes Short Term: Able to explain program exercise prescription;Long Term: Able to explain home exercise prescription to exercise independently                Exercise Goals Re-Evaluation :  Exercise Goals Re-Evaluation     [place] Name 10/27/20 0751 11/17/20 0713 12/14/20 1558 01/11/21 1602 02/08/21 1649     Exercise Goal Re-Evaluation   Exercise Goals Review Increase Physical Activity;Increase Strength and Stamina;Able to understand and use rate of perceived exertion (RPE) scale;Able to understand and use Dyspnea scale;Knowledge and understanding of Target Heart Rate Range (THRR);Able to check pulse independently;Understanding of Exercise Prescription Increase Physical Activity;Increase Strength and Stamina;Able to understand and use rate of perceived exertion (RPE) scale;Able to understand and use Dyspnea scale;Knowledge and understanding of Target Heart Rate Range (THRR);Able to check pulse independently;Understanding of Exercise Prescription Increase Physical Activity;Increase Strength and Stamina;Able to understand and use rate of perceived exertion (RPE) scale;Able to understand and use Dyspnea scale;Knowledge and understanding of Target Heart Rate Range (THRR);Able to check pulse independently;Understanding of Exercise Prescription Increase Physical Activity;Increase Strength and Stamina;Able to understand and use rate of perceived exertion (RPE) scale;Able to understand and use Dyspnea scale;Knowledge and understanding of Target Heart Rate Range (THRR);Able to check pulse independently;Understanding of Exercise Prescription Increase Physical  Activity;Increase Strength and Stamina;Able to understand and use rate of perceived exertion (RPE) scale;Able to understand and use Dyspnea scale;Knowledge and understanding of Target Heart Rate Range (THRR);Able to check pulse independently;Understanding of Exercise Prescription   Comments Patient has completed 2 execise sessions. She is very deconditioned. She gets very tired out by the warm up. She has complained of muscle soreness and fatigue during exercise as well as the next session. She seems a little discouraged, but I reassured her that it is a lot at first and that she would have to get used to it. She agreed. She is eager about working towards her goals. She is currenlty exercising at 1.8 METs on the NuStep. Will continue to monitor and progess as able. Pt has completed 8 exercise sessions. She is progressing slowly. She is now able to complete the warm up and is taking less breaks while on the stepper. She enjoys coming to rehab and is motivated to progress. She is currently exercising at 1.7 METs on the stepper. Will continue to monitor and progress as able. Pt has completed 16 exercise sessions. She continues to progress slowly. She is now able to do more sit to stands and her walking looks better. She is taking less breaks while on the stepper. She continues to be motivated. She is currently exercising at 1.8 METs on the stepper. Will continue to monitor and progress as able. Pt has completed 23 exercise sessions. She continues to progress slowly. Her walking continues to slowly improve and she has been able to do more sit to stands. She is now able to tolerate the stepper without taking any breaks. She is currently exercising at 2.0 METs on the stepper. Will continue to monitor and progress as able. Pt has completed 27 exercise sessions. She continues to progress slowly and recently had to miss several sessions due  to a COVID break out in the nursing home she stays at. Her balance continues to  improve. She is currently exercising at 1.8 METs on the stepper. Will continue to monitor and progress as able.   Expected Outcomes Through rehab and exercise at home, patient will achieve their goals. Through rehab and exercise at home, patient will achieve their goals. Through rehab and exercise at home, patient will achieve their goals. Through rehab and exercise at home, patient will achieve their goals. Through rehab and exercise at home, patient will achieve their goals.            Discharge Exercise Prescription (Final Exercise Prescription Changes):  Exercise Prescription Changes - 02/08/21 1600       Response to Exercise   Blood Pressure (Admit) 126/62    Blood Pressure (Exercise) 140/62    Blood Pressure (Exit) 122/60    Heart Rate (Admit) 80 bpm    Heart Rate (Exercise) 86 bpm    Heart Rate (Exit) 77 bpm    Oxygen Saturation (Admit) 96 %    Oxygen Saturation (Exercise) 95 %    Oxygen Saturation (Exit) 98 %    Rating of Perceived Exertion (Exercise) 11    Perceived Dyspnea (Exercise) 11    Duration Continue with 30 min of aerobic exercise without signs/symptoms of physical distress.    Intensity THRR unchanged      Progression   Progression Continue to progress workloads to maintain intensity without signs/symptoms of physical distress.      Resistance Training   Training Prescription Yes    Weight 2 lbs    Reps 10-15    Time 10 Minutes      NuStep   Level 1    SPM 93    Minutes 39    METs 1.8             Nutrition:  Target Goals: Understanding of nutrition guidelines, daily intake of sodium '1500mg'$ , cholesterol '200mg'$ , calories 30% from fat and 7% or less from saturated fats, daily to have 5 or more servings of fruits and vegetables.  Biometrics:  Pre Biometrics - 10/27/20 0751       Pre Biometrics   Weight 57 kg    BMI (Calculated) 24.54              Nutrition Therapy Plan and Nutrition Goals:  Nutrition Therapy & Goals - 01/03/21 1207        Personal Nutrition Goals   Comments She is in a long term care facility making it difficult to change her diet. Weight remains the same between 56-57kg.      Intervention Plan   Intervention Nutrition handout(s) given to patient.             Nutrition Assessments:  Nutrition Assessments - 10/14/20 1453       MEDFICTS Scores   Pre Score 43            MEDIFICTS Score Key: ?70 Need to make dietary changes  40-70 Heart Healthy Diet ? 40 Therapeutic Level Cholesterol Diet   Picture Your Plate Scores: [id number] Unhealthy dietary pattern with much room for improvement. 41-50 Dietary pattern unlikely to meet recommendations for good health and room for improvement. 51-60 More healthful dietary pattern, with some room for improvement.  >60 Healthy dietary pattern, although there may be some specific behaviors that could be improved.    Nutrition Goals Re-Evaluation:   Nutrition Goals Discharge (Final Nutrition Goals Re-Evaluation):   Psychosocial: Target  Goals: Acknowledge presence or absence of significant depression and/or stress, maximize coping skills, provide positive support system. Participant is able to verbalize types and ability to use techniques and skills needed for reducing stress and depression.  Initial Review & Psychosocial Screening:  Initial Psych Review & Screening - 10/14/20 1452       Initial Review   Current issues with Current Depression      Family Dynamics   Good Support System? Yes    Comments Her daughter, son in law, grand daughter, and cousin all support her.      Barriers   Psychosocial barriers to participate in program The patient should benefit from training in stress management and relaxation.      Screening Interventions   Interventions Encouraged to exercise    Expected Outcomes Long Term goal: The participant improves quality of Life and PHQ9 Scores as seen by post scores and/or verbalization of changes;Short Term goal:  Identification and review with participant of any Quality of Life or Depression concerns found by scoring the questionnaire.             Quality of Life Scores:  Quality of Life - 10/14/20 1557       Quality of Life   Select Quality of Life      Quality of Life Scores   Health/Function Pre 20.4 %    Socioeconomic Pre 30 %    Psych/Spiritual Pre 30 %    Family Pre 30 %    GLOBAL Pre 25.64 %            Scores of 19 and below usually indicate a poorer quality of life in these areas.  A difference of  2-3 points is a clinically meaningful difference.  A difference of 2-3 points in the total score of the Quality of Life Index has been associated with significant improvement in overall quality of life, self-image, physical symptoms, and general health in studies assessing change in quality of life.   PHQ-9: Recent Review Flowsheet Data     Depression screen [facility] 2/9 10/14/2020 03/11/2019   Decreased Interest 3 0   Down, Depressed, Hopeless 3 0   PHQ - 2 Score 6 0   Altered sleeping 3 -   Tired, decreased energy 3 -   Change in appetite 2 -   Feeling bad or failure about yourself  2 -   Trouble concentrating 1 -   Moving slowly or fidgety/restless 0 -   Suicidal thoughts 0 -   PHQ-9 Score 17 -   Difficult doing work/chores Very difficult -      Interpretation of Total Score  Total Score Depression Severity:  1-4 = Minimal depression, 5-9 = Mild depression, 10-14 = Moderate depression, 15-19 = Moderately severe depression, 20-27 = Severe depression   Psychosocial Evaluation and Intervention:  Psychosocial Evaluation - 10/14/20 1558       Psychosocial Evaluation & Interventions   Interventions Stress management education;Encouraged to exercise with the program and follow exercise prescription    Comments Pt has no barriers to completing pulmonary rehab. The only psychosocial issue that she has is depression. She has been depressed since her husband died in 2019-03-13. She is prescribed Zoloft. She reports that she thinks that it helps. Her PHQ-9 was a 17. A lot of this is related to the death of her husband. She also lives in a nursing home. She would like to move back home, but she understands that this will likely  never happen due to her frequent falls and not having a caretaker available throughout the day. She states that she copes well and has a strong support system with her daughter, son in law, grand daughter, and her cousin. Her goals in the program are to decrease her shortness of breath and to be able to walk better. Her legs give out a lot causing her to fall. She is hopeful that by coming to rehab, her legs will become stronger, and she will be able to walk better and fall less. She is excited to start the program and will likely benefit from the social aspect of the group class as well.    Expected Outcomes Her PHQ-9 score will decrease.    Continue Psychosocial Services  Follow up required by staff             Psychosocial Re-Evaluation:  Psychosocial Re-Evaluation     [place] Name 10/20/20 1338 11/10/20 1256 12/09/20 0729 01/03/21 1204 02/03/21 [id number]     Psychosocial Re-Evaluation   Current issues with Current Depression Current Depression Current Depression Current Depression Current Depression   Comments Patient is new to the program completing 1 sessions. She continues on Zoloft for management of her depression. We will continue to monitor. Patient has no psychosocial barriers identified. She has completed 6 sessions. She continues on Zoloft for management of her depression. She seems to enjoy coming to class and interacting with other patients and staff. She demonstrates a positive attitude and an interest in improving her health. We will continue to monitor. Patient has no psychosocial barriers identified. She has completed 14 sessions. She continues on Zoloft for management of her depression. She continues to enjoy coming to class and  interacting with other patients and staff. She demonstrates a positive attitude and an interest in improving her health. We will continue to monitor. Patient has no psychosocial barriers identified. She has completed 21 sessions. She continues on Zoloft for management of her depression. She continues to enjoy coming to class and interacting with other patients and staff. She demonstrates a positive attitude and an interest in improving her health.   We will continue to monitor. Patient has no psychosocial barriers identified. She has completed 26 sessions. She continues on Zoloft for management of her depression. She continues to enjoy coming to class and interacting with other patients and staff. She demonstrates a positive attitude and an interest in improving her health.   We will continue to monitor.   Expected Outcomes Patient's depression will continue to be managed with Zoloft and she will continue to have no additional psychosocial issues identified. Patient will continue to have no psychosocial barriers identified and her depression will continue to be managed with Zoloft. Patient will continue to have no psychosocial barriers identified and her depression will continue to be managed with Zoloft. Patient will continue to have no psychosocial barriers identified and her depression will continue to be managed with Zoloft. Patient will continue to have no psychosocial barriers identified and her depression will continue to be managed with Zoloft.   Interventions Stress management education;Relaxation education;Encouraged to attend Pulmonary Rehabilitation for the exercise Stress management education;Relaxation education;Encouraged to attend Pulmonary Rehabilitation for the exercise Stress management education;Relaxation education;Encouraged to attend Pulmonary Rehabilitation for the exercise Stress management education;Relaxation education;Encouraged to attend Pulmonary Rehabilitation for the exercise Stress  management education;Relaxation education;Encouraged to attend Pulmonary Rehabilitation for the exercise   Continue Psychosocial Services  No Follow up required No Follow up required No Follow up  required No Follow up required No Follow up required            Psychosocial Discharge (Final Psychosocial Re-Evaluation):  Psychosocial Re-Evaluation - 02/03/21 [id number]       Psychosocial Re-Evaluation   Current issues with Current Depression    Comments Patient has no psychosocial barriers identified. She has completed 26 sessions. She continues on Zoloft for management of her depression. She continues to enjoy coming to class and interacting with other patients and staff. She demonstrates a positive attitude and an interest in improving her health.   We will continue to monitor.    Expected Outcomes Patient will continue to have no psychosocial barriers identified and her depression will continue to be managed with Zoloft.    Interventions Stress management education;Relaxation education;Encouraged to attend Pulmonary Rehabilitation for the exercise    Continue Psychosocial Services  No Follow up required              Education: Education Goals: Education classes will be provided on a weekly basis, covering required topics. Participant will state understanding/return demonstration of topics presented.  Learning Barriers/Preferences:  Learning Barriers/Preferences - 10/14/20 1459       Learning Barriers/Preferences   Learning Barriers Hearing   HOH and wears hearing aides   Learning Preferences Written Material;Skilled Demonstration;Individual Instruction             Education Topics: How Lungs Work and Diseases: - Discuss the anatomy of the lungs and diseases that can affect the lungs, such as COPD. Flowsheet Row PULMONARY REHAB OTHER RESPIRATORY from 12/30/2020 in [place]  Date 12/02/20  Educator pb  Instruction Review Code 1- Verbalizes  Understanding       Exercise: -Discuss the importance of exercise, FITT principles of exercise, normal and abnormal responses to exercise, and how to exercise safely.   Environmental Irritants: -Discuss types of environmental irritants and how to limit exposure to environmental irritants. Flowsheet Row PULMONARY REHAB OTHER RESPIRATORY from 12/30/2020 in [place]  Date 12/09/20  Educator DJ  Instruction Review Code 1- Verbalizes Understanding       Meds/Inhalers and oxygen: - Discuss respiratory medications, definition of an inhaler and oxygen, and the proper way to use an inhaler and oxygen. Flowsheet Row PULMONARY REHAB OTHER RESPIRATORY from 12/30/2020 in [place]  Date 12/16/20  Educator DF       Energy Saving Techniques: - Discuss methods to conserve energy and decrease shortness of breath when performing activities of daily living.  Flowsheet Row PULMONARY REHAB OTHER RESPIRATORY from 12/30/2020 in [place]  Date 12/23/20  Educator DF  Instruction Review Code 2- Demonstrated Understanding       Bronchial Hygiene / Breathing Techniques: - Discuss breathing mechanics, pursed-lip breathing technique,  proper posture, effective ways to clear airways, and other functional breathing techniques Flowsheet Row PULMONARY REHAB OTHER RESPIRATORY from 12/30/2020 in [place]  Date 12/30/20  Educator pb  Instruction Review Code 1- Verbalizes Understanding       Cleaning Equipment: - Provides group verbal and written instruction about the health risks of elevated stress, cause of high stress, and healthy ways to reduce stress.   Nutrition I: Fats: - Discuss the types of cholesterol, what cholesterol does to the body, and how cholesterol levels can be controlled.   Nutrition II: Labels: -Discuss the different components of food labels and how to read food labels.   Respiratory  Infections: - Discuss the signs  and symptoms of respiratory infections, ways to prevent respiratory infections, and the importance of seeking medical treatment when having a respiratory infection. Flowsheet Row PULMONARY REHAB OTHER RESPIRATORY from 12/30/2020 in [place]  Date 10/28/20  Educator PB  Instruction Review Code 1- Verbalizes Understanding       Stress I: Signs and Symptoms: - Discuss the causes of stress, how stress may lead to anxiety and depression, and ways to limit stress. Flowsheet Row PULMONARY REHAB OTHER RESPIRATORY from 12/30/2020 in [place]  Date 11/04/20  Educator mk  Instruction Review Code 2- Demonstrated Understanding       Stress II: Relaxation: -Discuss relaxation techniques to limit stress. Flowsheet Row PULMONARY REHAB OTHER RESPIRATORY from 12/30/2020 in [place]  Date 11/10/20  Educator DJ  Instruction Review Code 1- Verbalizes Understanding       Oxygen for Home/Travel: - Discuss how to prepare for travel when on oxygen and proper ways to transport and store oxygen to ensure safety. Flowsheet Row PULMONARY REHAB OTHER RESPIRATORY from 12/30/2020 in [place]  Date 11/18/20  Educator DJ  Instruction Review Code 1- Verbalizes Understanding       Knowledge Questionnaire Score:  Knowledge Questionnaire Score - 10/14/20 1500       Knowledge Questionnaire Score   Pre Score 0/18             Core Components/Risk Factors/Patient Goals at Admission:  Personal Goals and Risk Factors at Admission - 10/14/20 1504       Core Components/Risk Factors/Patient Goals on Admission    Weight Management Yes;Weight Maintenance    Intervention Weight Management: Develop a combined nutrition and exercise program designed to reach desired caloric intake, while maintaining appropriate intake of nutrient and fiber, sodium and fats, and appropriate energy  expenditure required for the weight goal.;Weight Management: Provide education and appropriate resources to help participant work on and attain dietary goals.    Expected Outcomes Short Term: Continue to assess and modify interventions until short term weight is achieved;Long Term: Adherence to nutrition and physical activity/exercise program aimed toward attainment of established weight goal;Weight Maintenance: Understanding of the daily nutrition guidelines, which includes 25-35% calories from fat, 7% or less cal from saturated fats, less than '200mg'$  cholesterol, less than 1.5gm of sodium, & 5 or more servings of fruits and vegetables daily    Improve shortness of breath with ADL's Yes    Intervention Provide education, individualized exercise plan and daily activity instruction to help decrease symptoms of SOB with activities of daily living.    Expected Outcomes Short Term: Improve cardiorespiratory fitness to achieve a reduction of symptoms when performing ADLs;Long Term: Be able to perform more ADLs without symptoms or delay the onset of symptoms    Diabetes Yes    Intervention Provide education about signs/symptoms and action to take for hypo/hyperglycemia.;Provide education about proper nutrition, including hydration, and aerobic/resistive exercise prescription along with prescribed medications to achieve blood glucose in normal ranges: Fasting glucose 65-99 mg/dL    Expected Outcomes Short Term: Participant verbalizes understanding of the signs/symptoms and immediate care of hyper/hypoglycemia, proper foot care and importance of medication, aerobic/resistive exercise and nutrition plan for blood glucose control.;Long Term: Attainment of HbA1C < 7%.    Hypertension Yes    Intervention Provide education on lifestyle modifcations including regular physical activity/exercise, weight management, moderate sodium restriction and increased consumption of fresh fruit, vegetables, and low fat dairy, alcohol  moderation, and smoking cessation.;Monitor prescription  use compliance.    Expected Outcomes Short Term: Continued assessment and intervention until BP is < 140/73m HG in hypertensive participants. < 130/857mHG in hypertensive participants with diabetes, heart failure or chronic kidney disease.;Long Term: Maintenance of blood pressure at goal levels.    Lipids Yes    Intervention Provide education and support for participant on nutrition & aerobic/resistive exercise along with prescribed medications to achieve LDL '70mg'$ , HDL >'40mg'$ .    Expected Outcomes Short Term: Participant states understanding of desired cholesterol values and is compliant with medications prescribed. Participant is following exercise prescription and nutrition guidelines.;Long Term: Cholesterol controlled with medications as prescribed, with individualized exercise RX and with personalized nutrition plan. Value goals: LDL < '70mg'$ , HDL > 40 mg.    Personal Goal Other Yes    Personal Goal Improve her walking    Intervention Attend pulmonary rehab and do exercises to strengthen her legs.    Expected Outcomes Her legs will become stronger and she will be able to walk longer distances.             Core Components/Risk Factors/Patient Goals Review:   Goals and Risk Factor Review     Row Name 10/20/20 1339 11/10/20 1258 12/09/20 0729 01/03/21 1210 02/03/21 0834     Core Components/Risk Factors/Patient Goals Review   Personal Goals Review Other Other Other Other Other   Review Patient referred to pulmonary rehab with SOB. She has completed 1 sessions. Her personal goals for the program are to increase strength in her legs and improve her gait and balance. We will continue to monitor her progress as she works towards meeting these goals. Patient has completed 6 sessions losing 2 lbs since last 30 day review. She is doing well in the program with consistent attendance. She exercises on RA with her O2 saturation averging 94-96%. Her  blood pressures are well controlled. Her personal goals for the program are to get stronger in her legs and improve her gait and balance. We will continue to monitor her progress as she works towards meeting these goals. Paitent has completed 14 sessions gaining 1 lb since last 30 day review. She continues to do well in the program with consistent attendance. Her blood pressure continues to be controlled and her O2 saturations are running 95-96% on RA during exercise. Her continues to ambulate with a rollator. She has not had any falls since she started the program. Her personal goals for the program are to get stronger in her legs and improve her gait and balance. We will continue to monitor her progress as she works towards meeting these goals. Paitent has completed 21sessions. She continues to do well in the program with consistent attendance. Her blood pressure continues to be controlled and her O2 saturations are running 94-96% on RA during exercise. She continues to ambulate with a rollator. She has not had any falls since she started the program. Her personal goals for the program are to get stronger in her legs and improve her gait and balance. We will continue to monitor her progress as she works towards meeting these goals. Paitent has completed 27 sessions. She continues to do well in the program with consistent attendance and progression. Her blood pressure continues to be controlled and her O2 saturations are running 96 to 97% on RA during exercise. She continues to ambulate with a rollator. She has not had any falls since she started the program. Her personal goals for the program are to get stronger in her  legs and improve her gait and balance. We will continue to monitor her progress as she works towards meeting these goals.   Expected Outcomes Patient will complete the program meeting both personal and program goals. Patient will complete the program meeting both personal and program goals. Patient  will complete the program meeting both personal and program goals. Patient will complete the program meeting both personal and program goals. Patient will complete the program meeting both personal and program goals.            Core Components/Risk Factors/Patient Goals at Discharge (Final Review):   Goals and Risk Factor Review - 02/03/21 0834       Core Components/Risk Factors/Patient Goals Review   Personal Goals Review Other    Review Paitent has completed 27 sessions. She continues to do well in the program with consistent attendance and progression. Her blood pressure continues to be controlled and her O2 saturations are running 96 to 97% on RA during exercise. She continues to ambulate with a rollator. She has not had any falls since she started the program. Her personal goals for the program are to get stronger in her legs and improve her gait and balance. We will continue to monitor her progress as she works towards meeting these goals.    Expected Outcomes Patient will complete the program meeting both personal and program goals.             ITP Comments:   Comments: ITP REVIEW Pt is making expected progress toward pulmonary rehab goals after completing 28 sessions. Recommend continued exercise, life style modification, education, and utilization of breathing techniques to increase stamina and strength and decrease shortness of breath with exertion.

## 2021-02-10 ENCOUNTER — Encounter (HOSPITAL_COMMUNITY)
Admission: RE | Admit: 2021-02-10 | Discharge: 2021-02-10 | Disposition: A | Discharge status: Home / Self Care | Payer: Medicare Other | Source: Ambulatory Visit | Attending: Pulmonary Disease | Admitting: Pulmonary Disease

## 2021-02-10 ENCOUNTER — Other Ambulatory Visit: Payer: Self-pay

## 2021-02-10 DIAGNOSIS — R0602 Shortness of breath: Secondary | ICD-10-CM

## 2021-02-10 DIAGNOSIS — R942 Abnormal results of pulmonary function studies: Secondary | ICD-10-CM

## 2021-02-10 DIAGNOSIS — R053 Chronic cough: Secondary | ICD-10-CM

## 2021-02-10 DIAGNOSIS — Z9889 Other specified postprocedural states: Secondary | ICD-10-CM

## 2021-02-10 NOTE — Progress Notes (Signed)
Daily Session Note  Patient Details  Name: Angela Galvan MRN: [id number] Date of Birth: 02-19-1933 Referring Provider:   Flowsheet Row PULMONARY REHAB OTHER RESP ORIENTATION from 10/14/2020 in [place]  Referring Provider Dr. [name]       Encounter Date: 02/10/2021  Check In:  Session Check In - 02/10/21 1330       Check-In   Supervising physician immediately available to respond to emergencies [facility] MD immediately available    Physician(s) Dr. Domenic Polite    Location [facility]    Staff Present Geanie Cooley, RN;Dalton Kris Mouton, MS, ACSM-CEP, Exercise Physiologist;Other    Virtual Visit No    Medication changes reported     No    Fall or balance concerns reported    No    Tobacco Cessation No Change    Warm-up and Cool-down Performed as group-led instruction    Resistance Training Performed Yes    VAD Patient? No    PAD/SET Patient? No      Pain Assessment   Currently in Pain? No/denies    Multiple Pain Sites No             Capillary Blood Glucose: No results found for this or any previous visit (from the past 24 hour(s)).    Social History   Tobacco Use  Smoking Status Never  Smokeless Tobacco Never  Tobacco Comments   Never smoked    Goals Met:  Independence with exercise equipment Exercise tolerated well No report of concerns or symptoms today Strength training completed today  Goals Unmet:  Not Applicable  Comments: check out 1430   Dr. Kathie Dike is Medical Director for [facility] Pulmonary Rehab.

## 2021-02-15 ENCOUNTER — Encounter (HOSPITAL_COMMUNITY)
Admission: RE | Admit: 2021-02-15 | Discharge: 2021-02-15 | Disposition: A | Discharge status: Home / Self Care | Payer: Medicare Other | Source: Ambulatory Visit | Attending: Pulmonary Disease | Admitting: Pulmonary Disease

## 2021-02-15 ENCOUNTER — Other Ambulatory Visit: Payer: Self-pay

## 2021-02-15 DIAGNOSIS — Z9889 Other specified postprocedural states: Secondary | ICD-10-CM

## 2021-02-15 DIAGNOSIS — R0602 Shortness of breath: Secondary | ICD-10-CM

## 2021-02-15 DIAGNOSIS — R053 Chronic cough: Secondary | ICD-10-CM

## 2021-02-15 DIAGNOSIS — R942 Abnormal results of pulmonary function studies: Secondary | ICD-10-CM

## 2021-02-15 NOTE — Progress Notes (Signed)
Daily Session Note  Patient Details  Name: Angela Galvan MRN: [id number] Date of Birth: Aug 09, 1932 Referring Provider:   [place] from 10/14/2020 in [place]  Referring Provider Dr. [name]       Encounter Date: 02/15/2021  Check In:  Session Check In - 02/15/21 1330       Check-In   Supervising physician immediately available to respond to emergencies [facility] MD immediately available    Physician(s) Dr. Domenic Polite    Location [facility]    Staff Present Geanie Cooley, RN;Heather Otho Ket, BS, Exercise Physiologist;Dalton Kris Mouton, MS, ACSM-CEP, Exercise Physiologist    Virtual Visit No    Medication changes reported     No    Fall or balance concerns reported    No    Comments Uses the rollator for balance    Tobacco Cessation No Change    Warm-up and Cool-down Performed as group-led instruction    Resistance Training Performed Yes    VAD Patient? No    PAD/SET Patient? No      Pain Assessment   Currently in Pain? No/denies    Multiple Pain Sites No             Capillary Blood Glucose: No results found for this or any previous visit (from the past 24 hour(s)).    Social History   Tobacco Use  Smoking Status Never  Smokeless Tobacco Never  Tobacco Comments   Never smoked    Goals Met:  Proper associated with RPD/PD & O2 Sat Independence with exercise equipment Using PLB without cueing & demonstrates good technique Exercise tolerated well No report of concerns or symptoms today Strength training completed today  Goals Unmet:  Not Applicable  Comments: check out @ 2:30pm   Dr. Kathie Dike is Medical Director for [facility] Pulmonary Rehab.

## 2021-02-17 ENCOUNTER — Encounter (HOSPITAL_COMMUNITY)
Admission: RE | Admit: 2021-02-17 | Discharge: 2021-02-17 | Disposition: A | Discharge status: Home / Self Care | Payer: Medicare Other | Source: Ambulatory Visit | Attending: Pulmonary Disease | Admitting: Pulmonary Disease

## 2021-02-17 ENCOUNTER — Other Ambulatory Visit: Payer: Self-pay

## 2021-02-17 DIAGNOSIS — R0602 Shortness of breath: Secondary | ICD-10-CM

## 2021-02-17 DIAGNOSIS — R942 Abnormal results of pulmonary function studies: Secondary | ICD-10-CM

## 2021-02-17 DIAGNOSIS — R053 Chronic cough: Secondary | ICD-10-CM

## 2021-02-17 NOTE — Progress Notes (Signed)
Daily Session Note  Patient Details  Name: Angela Galvan MRN: [id number] Date of Birth: [birth year] Referring Provider:   Flowsheet Row PULMONARY REHAB OTHER RESP ORIENTATION from 10/14/2020 in [place]  Referring Provider Dr. [name]       Encounter Date: 02/17/2021  Check In:  Session Check In - 02/17/21 1405       Check-In   Supervising physician immediately available to respond to emergencies [facility] MD immediately available    Physician(s) Dr. Harl Bowie    Location [facility]    Staff Present Geanie Cooley, RN;Heather Otho Ket, BS, Exercise Physiologist;Ayrianna Mcginniss Wynetta Emery, RN, BSN    Virtual Visit No    Medication changes reported     No    Fall or balance concerns reported    Yes    Comments Uses the rollator for balance    Tobacco Cessation No Change    Warm-up and Cool-down Performed as group-led instruction    Resistance Training Performed Yes    VAD Patient? No    PAD/SET Patient? No      Pain Assessment   Currently in Pain? No/denies    Multiple Pain Sites No             Capillary Blood Glucose: No results found for this or any previous visit (from the past 24 hour(s)).    Social History   Tobacco Use  Smoking Status Never  Smokeless Tobacco Never  Tobacco Comments   Never smoked    Goals Met:  Proper associated with RPD/PD & O2 Sat Independence with exercise equipment Using PLB without cueing & demonstrates good technique Exercise tolerated well No report of concerns or symptoms today Strength training completed today  Goals Unmet:  Not Applicable  Comments: Check out 1430.   Dr. Kathie Dike is Medical Director for [facility] Pulmonary Rehab.

## 2021-02-22 ENCOUNTER — Encounter (HOSPITAL_COMMUNITY)
Admission: RE | Admit: 2021-02-22 | Discharge: 2021-02-22 | Disposition: A | Discharge status: Home / Self Care | Payer: Medicare Other | Source: Ambulatory Visit | Attending: Pulmonary Disease | Admitting: Pulmonary Disease

## 2021-02-22 ENCOUNTER — Other Ambulatory Visit: Payer: Self-pay

## 2021-02-22 VITALS — Wt 124.6 lb

## 2021-02-22 DIAGNOSIS — R0602 Shortness of breath: Secondary | ICD-10-CM

## 2021-02-22 DIAGNOSIS — R053 Chronic cough: Secondary | ICD-10-CM

## 2021-02-22 NOTE — Progress Notes (Signed)
Daily Session Note  Patient Details  Name: Angela Galvan MRN: [id number] Date of Birth: 11/14/1932 Referring Provider:   [place] from 10/14/2020 in [place]  Referring Provider Dr. [name]       Encounter Date: 02/22/2021  Check In:  Session Check In - 02/22/21 1330       Check-In   Supervising physician immediately available to respond to emergencies [facility] MD immediately available    Physician(s) Dr. Domenic Polite    Location [facility]    Staff Present Geanie Cooley, RN;Heather Otho Ket, BS, Exercise Physiologist;Dalton Kris Mouton, MS, ACSM-CEP, Exercise Physiologist    Virtual Visit No    Medication changes reported     No    Fall or balance concerns reported    Yes    Comments Uses the rollator for balance    Tobacco Cessation No Change    Warm-up and Cool-down Performed as group-led instruction    Resistance Training Performed Yes    VAD Patient? No    PAD/SET Patient? No      Pain Assessment   Currently in Pain? No/denies    Multiple Pain Sites No             Capillary Blood Glucose: No results found for this or any previous visit (from the past 24 hour(s)).    Social History   Tobacco Use  Smoking Status Never  Smokeless Tobacco Never  Tobacco Comments   Never smoked    Goals Met:  Proper associated with RPD/PD & O2 Sat Independence with exercise equipment Improved SOB with ADL's Exercise tolerated well No report of concerns or symptoms today Strength training completed today  Goals Unmet:  Not Applicable  Comments: check out @ 2:30pm   Dr. Kathie Dike is Medical Director for [facility] Pulmonary Rehab.

## 2021-02-24 ENCOUNTER — Other Ambulatory Visit: Payer: Self-pay

## 2021-02-24 ENCOUNTER — Encounter (HOSPITAL_COMMUNITY)
Admission: RE | Admit: 2021-02-24 | Discharge: 2021-02-24 | Disposition: A | Discharge status: Home / Self Care | Payer: Medicare Other | Source: Ambulatory Visit | Attending: Pulmonary Disease | Admitting: Pulmonary Disease

## 2021-02-24 DIAGNOSIS — R0602 Shortness of breath: Secondary | ICD-10-CM | POA: Diagnosis not present

## 2021-02-24 DIAGNOSIS — R053 Chronic cough: Secondary | ICD-10-CM

## 2021-02-24 DIAGNOSIS — R942 Abnormal results of pulmonary function studies: Secondary | ICD-10-CM

## 2021-02-24 NOTE — Progress Notes (Signed)
Daily Session Note  Patient Details  Name: Angela Galvan MRN: [id number] Date of Birth: 10-09-1932 Referring Provider:   [place] from 10/14/2020 in [place]  Referring Provider Dr. [name]       Encounter Date: 02/24/2021  Check In:  Session Check In - 02/24/21 1330       Check-In   Supervising physician immediately available to respond to emergencies [facility] MD immediately available    Physician(s) Dr. Domenic Polite    Location [facility]    Staff Present Hoy Register, MS, ACSM-CEP, Exercise Physiologist;Heather Zigmund Daniel, Exercise Physiologist    Virtual Visit No    Medication changes reported     No    Fall or balance concerns reported    Yes    Tobacco Cessation No Change    Warm-up and Cool-down Performed as group-led instruction    Resistance Training Performed Yes    VAD Patient? No    PAD/SET Patient? No      Pain Assessment   Currently in Pain? No/denies    Multiple Pain Sites No             Capillary Blood Glucose: No results found for this or any previous visit (from the past 24 hour(s)).    Social History   Tobacco Use  Smoking Status Never  Smokeless Tobacco Never  Tobacco Comments   Never smoked    Goals Met:  Independence with exercise equipment Exercise tolerated well No report of concerns or symptoms today Strength training completed today  Goals Unmet:  Not Applicable  Comments: checkout time is 1430   Dr. Kathie Dike is Medical Director for [facility] Pulmonary Rehab.

## 2021-03-01 ENCOUNTER — Other Ambulatory Visit: Payer: Self-pay

## 2021-03-01 ENCOUNTER — Encounter (HOSPITAL_COMMUNITY)
Admission: RE | Admit: 2021-03-01 | Discharge: 2021-03-01 | Disposition: A | Discharge status: Home / Self Care | Payer: Medicare Other | Source: Ambulatory Visit | Attending: Pulmonary Disease | Admitting: Pulmonary Disease

## 2021-03-01 DIAGNOSIS — R053 Chronic cough: Secondary | ICD-10-CM | POA: Diagnosis present

## 2021-03-01 DIAGNOSIS — R0602 Shortness of breath: Secondary | ICD-10-CM | POA: Insufficient documentation

## 2021-03-01 DIAGNOSIS — R942 Abnormal results of pulmonary function studies: Secondary | ICD-10-CM | POA: Insufficient documentation

## 2021-03-01 NOTE — Progress Notes (Signed)
Daily Session Note  Patient Details  Name: Angela Galvan MRN: [id number] Date of Birth: [birth year] Referring Provider:   [place] from 10/14/2020 in [place]  Referring Provider Dr. [name]       Encounter Date: 03/01/2021  Check In:  Session Check In - 03/01/21 1330       Check-In   Supervising physician immediately available to respond to emergencies [facility] MD immediately available    Physician(s) Dr. Domenic Polite    Location [facility]    Staff Present Hoy Register, MS, ACSM-CEP, Exercise Physiologist;Heather Zigmund Daniel, Exercise Physiologist;Phyllis Billingsley, RN    Virtual Visit No    Medication changes reported     No    Fall or balance concerns reported    Yes    Comments Uses the rollator for balance    Tobacco Cessation No Change    Warm-up and Cool-down Performed as group-led instruction    Resistance Training Performed Yes    VAD Patient? No    PAD/SET Patient? No      Pain Assessment   Currently in Pain? No/denies    Multiple Pain Sites No             Capillary Blood Glucose: No results found for this or any previous visit (from the past 24 hour(s)).    Social History   Tobacco Use  Smoking Status Never  Smokeless Tobacco Never  Tobacco Comments   Never smoked    Goals Met:  Independence with exercise equipment Exercise tolerated well No report of concerns or symptoms today Strength training completed today  Goals Unmet:  Not Applicable  Comments: checkout time is 1430   Dr. Kathie Dike is Medical Director for [facility] Pulmonary Rehab.

## 2021-03-01 NOTE — Progress Notes (Signed)
I have reviewed a Home Exercise Prescription with [name] . Angela Galvan is currently exercising at home.  The patient was advised to walk 3 days a week for 30-45 minutes.  Sheranda and I discussed how to progress their exercise prescription.  The patient stated that their goals were gain strength for everyday activities.  The patient stated that they understand the exercise prescription.  We reviewed exercise guidelines, target heart rate during exercise, RPE Scale, weather conditions, NTG use, endpoints for exercise, warmup and cool down.  Patient is encouraged to come to me with any questions. I will continue to follow up with the patient to assist them with progression and safety.

## 2021-03-03 ENCOUNTER — Encounter (HOSPITAL_COMMUNITY)
Admission: RE | Admit: 2021-03-03 | Discharge: 2021-03-03 | Disposition: A | Discharge status: Home / Self Care | Payer: Medicare Other | Source: Ambulatory Visit | Attending: Pulmonary Disease | Admitting: Pulmonary Disease

## 2021-03-03 ENCOUNTER — Other Ambulatory Visit: Payer: Self-pay

## 2021-03-03 DIAGNOSIS — R942 Abnormal results of pulmonary function studies: Secondary | ICD-10-CM

## 2021-03-03 DIAGNOSIS — R053 Chronic cough: Secondary | ICD-10-CM

## 2021-03-03 DIAGNOSIS — R0602 Shortness of breath: Secondary | ICD-10-CM

## 2021-03-03 NOTE — Progress Notes (Signed)
Daily Session Note  Patient Details  Name: Angela Galvan MRN: [id number] Date of Birth: 09-May-1933 Referring Provider:   [place] from 10/14/2020 in [place]  Referring Provider Dr. [name]       Encounter Date: 03/03/2021  Check In:  Session Check In - 03/03/21 1330       Check-In   Supervising physician immediately available to respond to emergencies [facility] MD immediately available    Physician(s) Dr. Domenic Polite    Location [facility]    Staff Present Geanie Cooley, RN;Dalton Kris Mouton, MS, ACSM-CEP, Exercise Physiologist;Heather Zigmund Daniel, Exercise Physiologist;Narmeen Kerper Wynetta Emery, RN, BSN    Virtual Visit No    Medication changes reported     No    Fall or balance concerns reported    Yes    Comments Uses the rollator for balance    Tobacco Cessation No Change    Warm-up and Cool-down Performed as group-led instruction    Resistance Training Performed Yes    VAD Patient? No    PAD/SET Patient? No      Pain Assessment   Currently in Pain? No/denies    Multiple Pain Sites No             Capillary Blood Glucose: No results found for this or any previous visit (from the past 24 hour(s)).    Social History   Tobacco Use  Smoking Status Never  Smokeless Tobacco Never  Tobacco Comments   Never smoked    Goals Met:  [place] associated with RPD/PD & O2 Sat Using PLB without cueing & demonstrates good technique Exercise tolerated well No report of concerns or symptoms today Strength training completed today  Goals Unmet:  Not Applicable  Comments: Check out 1430.   Dr. Kathie Dike is Medical Director for [facility].

## 2021-03-08 NOTE — Progress Notes (Signed)
Discharge Progress Report  Patient Details  Name: Angela Galvan MRN: [id number] Date of Birth: 01-Oct-1932 Referring Provider:   [place] from 10/14/2020 in [place]  Referring Provider Dr. [name]        Number of Visits: 35  Reason for Discharge:  Patient reached a stable level of exercise. Patient independent in their exercise. Patient has met program and personal goals.  Smoking History:  Social History   Tobacco Use  Smoking Status Never  Smokeless Tobacco Never  Tobacco Comments   Never smoked    Diagnosis:  Shortness of breath  Chronic cough  Decreased diffusion capacity  ADL UCSD:  Pulmonary Assessment Scores     Row Name 10/14/20 1448 03/08/21 1059       ADL UCSD   ADL Phase -- Exit    SOB Score total 50 75         CAT Score   CAT Score 21 14         mMRC Score   mMRC Score 3 3             Initial Exercise Prescription:  Initial Exercise Prescription - 10/14/20 1600       Date of Initial Exercise RX and Referring Provider   Date 10/14/20    Referring Provider Dr. [name]    Expected Discharge Date 02/17/21      NuStep   Level 1    SPM 60    Minutes 39      Prescription Details   Frequency (times per week) 2    Duration Progress to 30 minutes of continuous aerobic without signs/symptoms of physical distress      Intensity   THRR 40-80% of Max Heartrate 54-107    Ratings of Perceived Exertion 11-13    Perceived Dyspnea 0-4      Resistance Training   Training Prescription Yes    Weight 1 lbs    Reps 10-15             Discharge Exercise Prescription (Final Exercise Prescription Changes):  Exercise Prescription Changes - 03/01/21 1400       Response to Exercise   Blood Pressure (Admit) 135/58    Blood Pressure (Exercise) 120/58    Blood Pressure (Exit) 115/58    Heart Rate (Admit) 84 bpm    Heart Rate (Exercise) 85 bpm    Heart Rate (Exit) 85  bpm    Oxygen Saturation (Admit) 94 %    Oxygen Saturation (Exercise) 96 %    Oxygen Saturation (Exit) 96 %    Rating of Perceived Exertion (Exercise) 11    Perceived Dyspnea (Exercise) 12    Duration Continue with 30 min of aerobic exercise without signs/symptoms of physical distress.    Intensity THRR unchanged      Progression   Progression Continue to progress workloads to maintain intensity without signs/symptoms of physical distress.      Resistance Training   Training Prescription Yes    Weight 2    Reps 10-15    Time 10 Minutes      NuStep   Level 1    SPM 89    Minutes 39    METs 1.8      Home Exercise Plan   Plans to continue exercise at Home (comment)    Frequency Add 3 additional days to program exercise sessions.    Initial Home Exercises Provided 03/01/21  Functional Capacity:  6 Minute Walk     Row Name 10/14/20 1604 03/03/21 1407       6 Minute Walk   Phase Initial Discharge    Distance 800 feet 450 feet    Distance Feet Change -- 350 ft    Walk Time 6 minutes 6 minutes    # of Rest Breaks 1 2    MPH 1.52 0.9    METS 1.13 1.65    RPE 13 16    Perceived Dyspnea  15 15    VO2 Peak 3.96 5.7    Symptoms Yes (comment) Yes (comment)    Comments 1 seated rest break due to shortness of breath 2 seated rest breaks, ended test 1.5 minutes early due to tired legs giving out    Resting HR 76 bpm 80 bpm    Resting BP 122/56 122/60    Resting Oxygen Saturation  95 % 96 %    Exercise Oxygen Saturation  during 6 min walk 93 % 97 %    Max Ex. HR 102 bpm 103 bpm    Max Ex. BP 124/52 130/50    2 Minute Post BP 116/60 100/50         Interval HR   1 Minute HR 94 99    2 Minute HR 100 97    3 Minute HR 102 102    4 Minute HR 100 103    5 Minute HR 101 96    6 Minute HR 102 96    2 Minute Post HR 83 79    Interval Heart Rate? Yes Yes         Interval Oxygen   Interval Oxygen? Yes Yes    Baseline Oxygen Saturation % 95 % 96 %    1 Minute  Oxygen Saturation % 93 % 96 %    1 Minute Liters of Oxygen 0 L 0 L    2 Minute Oxygen Saturation % 94 % 97 %    2 Minute Liters of Oxygen 0 L 0 L    3 Minute Oxygen Saturation % 95 % 97 %    3 Minute Liters of Oxygen 0 L 0 L    4 Minute Oxygen Saturation % 95 % 97 %    4 Minute Liters of Oxygen 0 L 0 L    5 Minute Oxygen Saturation % 95 % 96 %    5 Minute Liters of Oxygen 0 L 0 L    6 Minute Oxygen Saturation % 96 % 96 %    6 Minute Liters of Oxygen 0 L 0 L    2 Minute Post Oxygen Saturation % 96 % 96 %    2 Minute Post Liters of Oxygen 0 L 0 L             Psychological, QOL, Others - Outcomes: PHQ 2/9: Depression screen [facility] 2/9 03/08/2021 10/14/2020 03/11/2019  Decreased Interest 2 3 0  Down, Depressed, Hopeless 2 3 0  PHQ - 2 Score 4 6 0  Altered sleeping 1 3 -  Tired, decreased energy 2 3 -  Change in appetite 0 2 -  Feeling bad or failure about yourself  1 2 -  Trouble concentrating 0 1 -  Moving slowly or fidgety/restless 0 0 -  Suicidal thoughts 0 0 -  PHQ-9 Score 8 17 -  Difficult doing work/chores Somewhat difficult Very difficult -  Some recent data might be hidden    Quality of  Life:  Quality of Life - 10/14/20 1557       Quality of Life   Select Quality of Life      Quality of Life Scores   Health/Function Pre 20.4 %    Socioeconomic Pre 30 %    Psych/Spiritual Pre 30 %    Family Pre 30 %    GLOBAL Pre 25.64 %             Personal Goals: Goals established at orientation with interventions provided to work toward goal.  Personal Goals and Risk Factors at Admission - 10/14/20 1504       Core Components/Risk Factors/Patient Goals on Admission    Weight Management Yes;Weight Maintenance    Intervention Weight Management: Develop a combined nutrition and exercise program designed to reach desired caloric intake, while maintaining appropriate intake of nutrient and fiber, sodium and fats, and appropriate energy expenditure required for the weight  goal.;Weight Management: Provide education and appropriate resources to help participant work on and attain dietary goals.    Expected Outcomes Short Term: Continue to assess and modify interventions until short term weight is achieved;Long Term: Adherence to nutrition and physical activity/exercise program aimed toward attainment of established weight goal;Weight Maintenance: Understanding of the daily nutrition guidelines, which includes 25-35% calories from fat, 7% or less cal from saturated fats, less than 213m cholesterol, less than 1.5gm of sodium, & 5 or more servings of fruits and vegetables daily    Improve shortness of breath with ADL's Yes    Intervention Provide education, individualized exercise plan and daily activity instruction to help decrease symptoms of SOB with activities of daily living.    Expected Outcomes Short Term: Improve cardiorespiratory fitness to achieve a reduction of symptoms when performing ADLs;Long Term: Be able to perform more ADLs without symptoms or delay the onset of symptoms    Diabetes Yes    Intervention Provide education about signs/symptoms and action to take for hypo/hyperglycemia.;Provide education about proper nutrition, including hydration, and aerobic/resistive exercise prescription along with prescribed medications to achieve blood glucose in normal ranges: Fasting glucose 65-99 mg/dL    Expected Outcomes Short Term: Participant verbalizes understanding of the signs/symptoms and immediate care of hyper/hypoglycemia, proper foot care and importance of medication, aerobic/resistive exercise and nutrition plan for blood glucose control.;Long Term: Attainment of HbA1C < 7%.    Hypertension Yes    Intervention Provide education on lifestyle modifcations including regular physical activity/exercise, weight management, moderate sodium restriction and increased consumption of fresh fruit, vegetables, and low fat dairy, alcohol moderation, and smoking  cessation.;Monitor prescription use compliance.    Expected Outcomes Short Term: Continued assessment and intervention until BP is < 140/968mHG in hypertensive participants. < 130/8047mG in hypertensive participants with diabetes, heart failure or chronic kidney disease.;Long Term: Maintenance of blood pressure at goal levels.    Lipids Yes    Intervention Provide education and support for participant on nutrition & aerobic/resistive exercise along with prescribed medications to achieve LDL <42m34mDL >40mg56m Expected Outcomes Short Term: Participant states understanding of desired cholesterol values and is compliant with medications prescribed. Participant is following exercise prescription and nutrition guidelines.;Long Term: Cholesterol controlled with medications as prescribed, with individualized exercise RX and with personalized nutrition plan. Value goals: LDL < 42mg,67m > 40 mg.    Personal Goal Other Yes    Personal Goal Improve her walking    Intervention Attend pulmonary rehab and do exercises to strengthen her legs.  Expected Outcomes Her legs will become stronger and she will be able to walk longer distances.              Personal Goals Discharge:  Goals and Risk Factor Review     Row Name 10/20/20 1339 11/10/20 1258 12/09/20 0729 01/03/21 1210 02/03/21 0834     Core Components/Risk Factors/Patient Goals Review   Personal Goals Review Other Other Other Other Other   Review Patient referred to pulmonary rehab with SOB. She has completed 1 sessions. Her personal goals for the program are to increase strength in her legs and improve her gait and balance. We will continue to monitor her progress as she works towards meeting these goals. Patient has completed 6 sessions losing 2 lbs since last 30 day review. She is doing well in the program with consistent attendance. She exercises on RA with her O2 saturation averging 94-96%. Her blood pressures are well controlled. Her  personal goals for the program are to get stronger in her legs and improve her gait and balance. We will continue to monitor her progress as she works towards meeting these goals. Paitent has completed 14 sessions gaining 1 lb since last 30 day review. She continues to do well in the program with consistent attendance. Her blood pressure continues to be controlled and her O2 saturations are running 95-96% on RA during exercise. Her continues to ambulate with a rollator. She has not had any falls since she started the program. Her personal goals for the program are to get stronger in her legs and improve her gait and balance. We will continue to monitor her progress as she works towards meeting these goals. Paitent has completed 21sessions. She continues to do well in the program with consistent attendance. Her blood pressure continues to be controlled and her O2 saturations are running 94-96% on RA during exercise. She continues to ambulate with a rollator. She has not had any falls since she started the program. Her personal goals for the program are to get stronger in her legs and improve her gait and balance. We will continue to monitor her progress as she works towards meeting these goals. Paitent has completed 27 sessions. She continues to do well in the program with consistent attendance and progression. Her blood pressure continues to be controlled and her O2 saturations are running 96 to 97% on RA during exercise. She continues to ambulate with a rollator. She has not had any falls since she started the program. Her personal goals for the program are to get stronger in her legs and improve her gait and balance. We will continue to monitor her progress as she works towards meeting these goals.   Expected Outcomes Patient will complete the program meeting both personal and program goals. Patient will complete the program meeting both personal and program goals. Patient will complete the program meeting both  personal and program goals. Patient will complete the program meeting both personal and program goals. Patient will complete the program meeting both personal and program goals.    [place] Name 03/08/21 1102             Core Components/Risk Factors/Patient Goals Review   Personal Goals Review Other       Review Pt graduated after completing 35 sessions. Her attendance was consistent and all vitals were WNL. During her last couple of weeks in the program she began to fall several times a week again at the [facility] home that she lives at. She reports  that her legs give out on her and she falls.       Expected Outcomes Patient will continue to work towards their goals post discharge.                Exercise Goals and Review:  Exercise Goals     Row Name 10/14/20 1608 10/27/20 0750 11/17/20 0712 12/14/20 1558 01/11/21 1601     Exercise Goals   Increase Physical Activity Yes Yes Yes Yes Yes   Intervention Provide advice, education, support and counseling about physical activity/exercise needs.;Develop an individualized exercise prescription for aerobic and resistive training based on initial evaluation findings, risk stratification, comorbidities and participant's personal goals. Provide advice, education, support and counseling about physical activity/exercise needs.;Develop an individualized exercise prescription for aerobic and resistive training based on initial evaluation findings, risk stratification, comorbidities and participant's personal goals. Provide advice, education, support and counseling about physical activity/exercise needs.;Develop an individualized exercise prescription for aerobic and resistive training based on initial evaluation findings, risk stratification, comorbidities and participant's personal goals. Provide advice, education, support and counseling about physical activity/exercise needs.;Develop an individualized exercise prescription for aerobic and resistive  training based on initial evaluation findings, risk stratification, comorbidities and participant's personal goals. Provide advice, education, support and counseling about physical activity/exercise needs.;Develop an individualized exercise prescription for aerobic and resistive training based on initial evaluation findings, risk stratification, comorbidities and participant's personal goals.   Expected Outcomes Short Term: Attend rehab on a regular basis to increase amount of physical activity.;Long Term: Add in home exercise to make exercise part of routine and to increase amount of physical activity.;Long Term: Exercising regularly at least 3-5 days a week. Short Term: Attend rehab on a regular basis to increase amount of physical activity.;Long Term: Add in home exercise to make exercise part of routine and to increase amount of physical activity.;Long Term: Exercising regularly at least 3-5 days a week. Short Term: Attend rehab on a regular basis to increase amount of physical activity.;Long Term: Add in home exercise to make exercise part of routine and to increase amount of physical activity.;Long Term: Exercising regularly at least 3-5 days a week. Short Term: Attend rehab on a regular basis to increase amount of physical activity.;Long Term: Add in home exercise to make exercise part of routine and to increase amount of physical activity.;Long Term: Exercising regularly at least 3-5 days a week. Short Term: Attend rehab on a regular basis to increase amount of physical activity.;Long Term: Add in home exercise to make exercise part of routine and to increase amount of physical activity.;Long Term: Exercising regularly at least 3-5 days a week.   Increase Strength and Stamina Yes Yes Yes Yes Yes   Intervention Provide advice, education, support and counseling about physical activity/exercise needs.;Develop an individualized exercise prescription for aerobic and resistive training based on initial  evaluation findings, risk stratification, comorbidities and participant's personal goals. Provide advice, education, support and counseling about physical activity/exercise needs.;Develop an individualized exercise prescription for aerobic and resistive training based on initial evaluation findings, risk stratification, comorbidities and participant's personal goals. Provide advice, education, support and counseling about physical activity/exercise needs.;Develop an individualized exercise prescription for aerobic and resistive training based on initial evaluation findings, risk stratification, comorbidities and participant's personal goals. Provide advice, education, support and counseling about physical activity/exercise needs.;Develop an individualized exercise prescription for aerobic and resistive training based on initial evaluation findings, risk stratification, comorbidities and participant's personal goals. Provide advice, education, support and counseling about physical activity/exercise needs.;Develop an  individualized exercise prescription for aerobic and resistive training based on initial evaluation findings, risk stratification, comorbidities and participant's personal goals.   Expected Outcomes Short Term: Increase workloads from initial exercise prescription for resistance, speed, and METs.;Short Term: Perform resistance training exercises routinely during rehab and add in resistance training at home;Long Term: Improve cardiorespiratory fitness, muscular endurance and strength as measured by increased METs and functional capacity (6MWT) Short Term: Increase workloads from initial exercise prescription for resistance, speed, and METs.;Short Term: Perform resistance training exercises routinely during rehab and add in resistance training at home;Long Term: Improve cardiorespiratory fitness, muscular endurance and strength as measured by increased METs and functional capacity (6MWT) Short Term:  Increase workloads from initial exercise prescription for resistance, speed, and METs.;Short Term: Perform resistance training exercises routinely during rehab and add in resistance training at home;Long Term: Improve cardiorespiratory fitness, muscular endurance and strength as measured by increased METs and functional capacity (6MWT) Short Term: Increase workloads from initial exercise prescription for resistance, speed, and METs.;Short Term: Perform resistance training exercises routinely during rehab and add in resistance training at home;Long Term: Improve cardiorespiratory fitness, muscular endurance and strength as measured by increased METs and functional capacity (6MWT) Short Term: Increase workloads from initial exercise prescription for resistance, speed, and METs.;Short Term: Perform resistance training exercises routinely during rehab and add in resistance training at home;Long Term: Improve cardiorespiratory fitness, muscular endurance and strength as measured by increased METs and functional capacity (6MWT)   Able to understand and use rate of perceived exertion (RPE) scale Yes Yes Yes Yes Yes   Intervention Provide education and explanation on how to use RPE scale Provide education and explanation on how to use RPE scale Provide education and explanation on how to use RPE scale Provide education and explanation on how to use RPE scale Provide education and explanation on how to use RPE scale   Expected Outcomes Short Term: Able to use RPE daily in rehab to express subjective intensity level;Long Term:  Able to use RPE to guide intensity level when exercising independently Short Term: Able to use RPE daily in rehab to express subjective intensity level;Long Term:  Able to use RPE to guide intensity level when exercising independently Short Term: Able to use RPE daily in rehab to express subjective intensity level;Long Term:  Able to use RPE to guide intensity level when exercising independently  Short Term: Able to use RPE daily in rehab to express subjective intensity level;Long Term:  Able to use RPE to guide intensity level when exercising independently Short Term: Able to use RPE daily in rehab to express subjective intensity level;Long Term:  Able to use RPE to guide intensity level when exercising independently   Able to understand and use Dyspnea scale Yes Yes Yes Yes Yes   Intervention Provide education and explanation on how to use Dyspnea scale Provide education and explanation on how to use Dyspnea scale Provide education and explanation on how to use Dyspnea scale Provide education and explanation on how to use Dyspnea scale Provide education and explanation on how to use Dyspnea scale   Expected Outcomes Short Term: Able to use Dyspnea scale daily in rehab to express subjective sense of shortness of breath during exertion;Long Term: Able to use Dyspnea scale to guide intensity level when exercising independently Short Term: Able to use Dyspnea scale daily in rehab to express subjective sense of shortness of breath during exertion;Long Term: Able to use Dyspnea scale to guide intensity level when exercising independently Short Term:  Able to use Dyspnea scale daily in rehab to express subjective sense of shortness of breath during exertion;Long Term: Able to use Dyspnea scale to guide intensity level when exercising independently Short Term: Able to use Dyspnea scale daily in rehab to express subjective sense of shortness of breath during exertion;Long Term: Able to use Dyspnea scale to guide intensity level when exercising independently Short Term: Able to use Dyspnea scale daily in rehab to express subjective sense of shortness of breath during exertion;Long Term: Able to use Dyspnea scale to guide intensity level when exercising independently   Knowledge and understanding of Target Heart Rate Range (THRR) Yes Yes Yes Yes Yes   Intervention Provide education and explanation of THRR  including how the numbers were predicted and where they are located for reference Provide education and explanation of THRR including how the numbers were predicted and where they are located for reference Provide education and explanation of THRR including how the numbers were predicted and where they are located for reference Provide education and explanation of THRR including how the numbers were predicted and where they are located for reference Provide education and explanation of THRR including how the numbers were predicted and where they are located for reference   Expected Outcomes Short Term: Able to state/look up THRR;Long Term: Able to use THRR to govern intensity when exercising independently;Short Term: Able to use daily as guideline for intensity in rehab Short Term: Able to state/look up THRR;Long Term: Able to use THRR to govern intensity when exercising independently;Short Term: Able to use daily as guideline for intensity in rehab Short Term: Able to state/look up THRR;Long Term: Able to use THRR to govern intensity when exercising independently;Short Term: Able to use daily as guideline for intensity in rehab Short Term: Able to state/look up THRR;Long Term: Able to use THRR to govern intensity when exercising independently;Short Term: Able to use daily as guideline for intensity in rehab Short Term: Able to state/look up THRR;Long Term: Able to use THRR to govern intensity when exercising independently;Short Term: Able to use daily as guideline for intensity in rehab   Understanding of Exercise Prescription Yes Yes Yes Yes Yes   Intervention Provide education, explanation, and written materials on patient's individual exercise prescription Provide education, explanation, and written materials on patient's individual exercise prescription Provide education, explanation, and written materials on patient's individual exercise prescription Provide education, explanation, and written materials on  patient's individual exercise prescription Provide education, explanation, and written materials on patient's individual exercise prescription   Expected Outcomes Short Term: Able to explain program exercise prescription;Long Term: Able to explain home exercise prescription to exercise independently Short Term: Able to explain program exercise prescription;Long Term: Able to explain home exercise prescription to exercise independently Short Term: Able to explain program exercise prescription;Long Term: Able to explain home exercise prescription to exercise independently Short Term: Able to explain program exercise prescription;Long Term: Able to explain home exercise prescription to exercise independently Short Term: Able to explain program exercise prescription;Long Term: Able to explain home exercise prescription to exercise independently    [place] Name 02/08/21 1649             Exercise Goals   Increase Physical Activity Yes       Intervention Provide advice, education, support and counseling about physical activity/exercise needs.;Develop an individualized exercise prescription for aerobic and resistive training based on initial evaluation findings, risk stratification, comorbidities and participant's personal goals.       Expected Outcomes Short Term:  Attend rehab on a regular basis to increase amount of physical activity.;Long Term: Add in home exercise to make exercise part of routine and to increase amount of physical activity.;Long Term: Exercising regularly at least 3-5 days a week.       Increase Strength and Stamina Yes       Intervention Provide advice, education, support and counseling about physical activity/exercise needs.;Develop an individualized exercise prescription for aerobic and resistive training based on initial evaluation findings, risk stratification, comorbidities and participant's personal goals.       Expected Outcomes Short Term: Increase workloads from initial exercise  prescription for resistance, speed, and METs.;Short Term: Perform resistance training exercises routinely during rehab and add in resistance training at home;Long Term: Improve cardiorespiratory fitness, muscular endurance and strength as measured by increased METs and functional capacity (6MWT)       Able to understand and use rate of perceived exertion (RPE) scale Yes       Intervention Provide education and explanation on how to use RPE scale       Expected Outcomes Short Term: Able to use RPE daily in rehab to express subjective intensity level;Long Term:  Able to use RPE to guide intensity level when exercising independently       Able to understand and use Dyspnea scale Yes       Intervention Provide education and explanation on how to use Dyspnea scale       Expected Outcomes Short Term: Able to use Dyspnea scale daily in rehab to express subjective sense of shortness of breath during exertion;Long Term: Able to use Dyspnea scale to guide intensity level when exercising independently       Knowledge and understanding of Target Heart Rate Range (THRR) Yes       Intervention Provide education and explanation of THRR including how the numbers were predicted and where they are located for reference       Expected Outcomes Short Term: Able to state/look up THRR;Long Term: Able to use THRR to govern intensity when exercising independently;Short Term: Able to use daily as guideline for intensity in rehab       Understanding of Exercise Prescription Yes       Intervention Provide education, explanation, and written materials on patient's individual exercise prescription       Expected Outcomes Short Term: Able to explain program exercise prescription;Long Term: Able to explain home exercise prescription to exercise independently                Exercise Goals Re-Evaluation:  Exercise Goals Re-Evaluation     Row Name 10/27/20 0751 11/17/20 0713 12/14/20 1558 01/11/21 1602 02/08/21 1649      Exercise Goal Re-Evaluation   Exercise Goals Review Increase Physical Activity;Increase Strength and Stamina;Able to understand and use rate of perceived exertion (RPE) scale;Able to understand and use Dyspnea scale;Knowledge and understanding of Target Heart Rate Range (THRR);Able to check pulse independently;Understanding of Exercise Prescription Increase Physical Activity;Increase Strength and Stamina;Able to understand and use rate of perceived exertion (RPE) scale;Able to understand and use Dyspnea scale;Knowledge and understanding of Target Heart Rate Range (THRR);Able to check pulse independently;Understanding of Exercise Prescription Increase Physical Activity;Increase Strength and Stamina;Able to understand and use rate of perceived exertion (RPE) scale;Able to understand and use Dyspnea scale;Knowledge and understanding of Target Heart Rate Range (THRR);Able to check pulse independently;Understanding of Exercise Prescription Increase Physical Activity;Increase Strength and Stamina;Able to understand and use rate of perceived exertion (RPE) scale;Able to understand and use  Dyspnea scale;Knowledge and understanding of Target Heart Rate Range (THRR);Able to check pulse independently;Understanding of Exercise Prescription Increase Physical Activity;Increase Strength and Stamina;Able to understand and use rate of perceived exertion (RPE) scale;Able to understand and use Dyspnea scale;Knowledge and understanding of Target Heart Rate Range (THRR);Able to check pulse independently;Understanding of Exercise Prescription   Comments Patient has completed 2 execise sessions. She is very deconditioned. She gets very tired out by the warm up. She has complained of muscle soreness and fatigue during exercise as well as the next session. She seems a little discouraged, but I reassured her that it is a lot at first and that she would have to get used to it. She agreed. She is eager about working towards her goals. She is  currenlty exercising at 1.8 METs on the NuStep. Will continue to monitor and progess as able. Pt has completed 8 exercise sessions. She is progressing slowly. She is now able to complete the warm up and is taking less breaks while on the stepper. She enjoys coming to rehab and is motivated to progress. She is currently exercising at 1.7 METs on the stepper. Will continue to monitor and progress as able. Pt has completed 16 exercise sessions. She continues to progress slowly. She is now able to do more sit to stands and her walking looks better. She is taking less breaks while on the stepper. She continues to be motivated. She is currently exercising at 1.8 METs on the stepper. Will continue to monitor and progress as able. Pt has completed 23 exercise sessions. She continues to progress slowly. Her walking continues to slowly improve and she has been able to do more sit to stands. She is now able to tolerate the stepper without taking any breaks. She is currently exercising at 2.0 METs on the stepper. Will continue to monitor and progress as able. Pt has completed 27 exercise sessions. She continues to progress slowly and recently had to miss several sessions due to a COVID break out in the nursing home she stays at. Her balance continues to improve. She is currently exercising at 1.8 METs on the stepper. Will continue to monitor and progress as able.   Expected Outcomes Through rehab and exercise at home, patient will achieve their goals. Through rehab and exercise at home, patient will achieve their goals. Through rehab and exercise at home, patient will achieve their goals. Through rehab and exercise at home, patient will achieve their goals. Through rehab and exercise at home, patient will achieve their goals.            Nutrition & Weight - Outcomes:  Pre Biometrics - 10/27/20 0751       Pre Biometrics   Weight 125 lb 10.6 oz (57 kg)    BMI (Calculated) 24.54              Nutrition:   Nutrition Therapy & Goals - 01/03/21 1207       Personal Nutrition Goals   Comments She is in a long term care facility making it difficult to change her diet. Weight remains the same between 56-57kg.      Intervention Plan   Intervention Nutrition handout(s) given to patient.             Nutrition Discharge:  Nutrition Assessments - 03/08/21 1059       MEDFICTS Scores   Pre Score 43    Post Score 22    Score Difference -21  Education Questionnaire Score:  Knowledge Questionnaire Score - 03/08/21 1059       Knowledge Questionnaire Score   Pre Score 0/18    Post Score 14/18             Goals reviewed with patient; copy given to patient. Pt graduated from pulmonary reha after 35 sessions. She had good attendance while in the program and benefitted from the social interaction. Her PHQ-9 score decreased from a 17 at orientation to a 8 at discharge. All vitals were WNL while she was in the program. During her last couple of weeks in rehab she reported falling several times at home and that her legs were frequently giving out on her. We had to terminate her discharge six minute walk test early due to her legs giving out. We were able to get her seated on her rollator before she fell. Her MET level while exercising was 1.8 at discharge. She states that she will continue to exercise as much as her legs will allow her too by walking at home post discharge.

## 2021-03-29 ENCOUNTER — Other Ambulatory Visit (HOSPITAL_BASED_OUTPATIENT_CLINIC_OR_DEPARTMENT_OTHER): Payer: Self-pay | Admitting: Family Medicine

## 2021-03-29 ENCOUNTER — Other Ambulatory Visit (HOSPITAL_COMMUNITY): Payer: Self-pay | Admitting: Family Medicine

## 2021-03-29 DIAGNOSIS — S0990XA Unspecified injury of head, initial encounter: Secondary | ICD-10-CM

## 2021-03-29 DIAGNOSIS — R55 Syncope and collapse: Secondary | ICD-10-CM

## 2021-05-03 ENCOUNTER — Emergency Department (HOSPITAL_COMMUNITY): Payer: Medicare Other

## 2021-05-03 ENCOUNTER — Emergency Department (HOSPITAL_COMMUNITY)
Admission: RE | Admit: 2021-05-03 | Discharge: 2021-05-03 | Disposition: A | Discharge status: Home / Self Care | Payer: Medicare Other | Source: Ambulatory Visit | Attending: Family Medicine | Admitting: Family Medicine

## 2021-05-03 ENCOUNTER — Emergency Department (HOSPITAL_COMMUNITY)
Admission: EM | Admit: 2021-05-03 | Discharge: 2021-05-03 | Disposition: A | Discharge status: Home / Self Care | Payer: Medicare Other | Attending: Emergency Medicine | Admitting: Emergency Medicine

## 2021-05-03 ENCOUNTER — Other Ambulatory Visit: Payer: Self-pay

## 2021-05-03 ENCOUNTER — Ambulatory Visit (HOSPITAL_COMMUNITY)
Admission: RE | Admit: 2021-05-03 | Discharge: 2021-05-03 | Disposition: A | Discharge status: Home / Self Care | Payer: Medicare Other | Source: Ambulatory Visit | Attending: Family Medicine | Admitting: Family Medicine

## 2021-05-03 DIAGNOSIS — R55 Syncope and collapse: Secondary | ICD-10-CM

## 2021-05-03 DIAGNOSIS — I10 Essential (primary) hypertension: Secondary | ICD-10-CM | POA: Insufficient documentation

## 2021-05-03 DIAGNOSIS — S0990XA Unspecified injury of head, initial encounter: Secondary | ICD-10-CM

## 2021-05-03 DIAGNOSIS — M25552 Pain in left hip: Secondary | ICD-10-CM | POA: Insufficient documentation

## 2021-05-03 DIAGNOSIS — E119 Type 2 diabetes mellitus without complications: Secondary | ICD-10-CM | POA: Insufficient documentation

## 2021-05-03 DIAGNOSIS — E039 Hypothyroidism, unspecified: Secondary | ICD-10-CM | POA: Diagnosis not present

## 2021-05-03 DIAGNOSIS — Z7982 Long term (current) use of aspirin: Secondary | ICD-10-CM | POA: Diagnosis not present

## 2021-05-03 DIAGNOSIS — W19XXXD Unspecified fall, subsequent encounter: Secondary | ICD-10-CM | POA: Diagnosis not present

## 2021-05-03 DIAGNOSIS — Z853 Personal history of malignant neoplasm of breast: Secondary | ICD-10-CM | POA: Insufficient documentation

## 2021-05-03 DIAGNOSIS — Z79899 Other long term (current) drug therapy: Secondary | ICD-10-CM | POA: Diagnosis not present

## 2021-05-03 DIAGNOSIS — Z96652 Presence of left artificial knee joint: Secondary | ICD-10-CM | POA: Diagnosis not present

## 2021-05-03 DIAGNOSIS — R519 Headache, unspecified: Secondary | ICD-10-CM | POA: Insufficient documentation

## 2021-05-03 DIAGNOSIS — J45909 Unspecified asthma, uncomplicated: Secondary | ICD-10-CM | POA: Insufficient documentation

## 2021-05-03 LAB — CBC WITH DIFFERENTIAL/PLATELET
Abs Immature Granulocytes: 0.26 10*3/uL — ABNORMAL HIGH (ref 0.00–0.07)
Basophils Absolute: 0.1 10*3/uL (ref 0.0–0.1)
Basophils Relative: 1 %
Eosinophils Absolute: 0.1 10*3/uL (ref 0.0–0.5)
Eosinophils Relative: 1 %
HCT: 37.7 % (ref 36.0–46.0)
Hemoglobin: 12.4 g/dL (ref 12.0–15.0)
Immature Granulocytes: 3 %
Lymphocytes Relative: 18 %
Lymphs Abs: 1.7 10*3/uL (ref 0.7–4.0)
MCH: 30.3 pg (ref 26.0–34.0)
MCHC: 32.9 g/dL (ref 30.0–36.0)
MCV: 92.2 fL (ref 80.0–100.0)
Monocytes Absolute: 1 10*3/uL (ref 0.1–1.0)
Monocytes Relative: 11 %
Neutro Abs: 6.5 10*3/uL (ref 1.7–7.7)
Neutrophils Relative %: 66 %
Platelets: 293 10*3/uL (ref 150–400)
RBC: 4.09 MIL/uL (ref 3.87–5.11)
RDW: 12.7 % (ref 11.5–15.5)
WBC: 9.7 10*3/uL (ref 4.0–10.5)
nRBC: 0 % (ref 0.0–0.2)

## 2021-05-03 LAB — BASIC METABOLIC PANEL
Anion gap: 11 (ref 5–15)
BUN: 23 mg/dL (ref 8–23)
CO2: 20 mmol/L — ABNORMAL LOW (ref 22–32)
Calcium: 9.5 mg/dL (ref 8.9–10.3)
Chloride: 101 mmol/L (ref 98–111)
Creatinine, Ser: 1.2 mg/dL — ABNORMAL HIGH (ref 0.44–1.00)
GFR, Estimated: 44 mL/min — ABNORMAL LOW (ref >60)
Glucose, Bld: 133 mg/dL — ABNORMAL HIGH (ref 70–99)
Potassium: 4.2 mmol/L (ref 3.5–5.1)
Sodium: 132 mmol/L — ABNORMAL LOW (ref 135–145)

## 2021-05-03 NOTE — ED Triage Notes (Signed)
Pt arrived by EMS from living facility. Pt has a fall while attempted to get back into bed. Pt hit both sides of her head, on esid eon night stand and one side on bed. No open wounds and no LOC  Pt has multiple falls in past couple months    Pt ambulatory to bed from stretcher with assist

## 2021-05-03 NOTE — ED Notes (Signed)
Dc instructions reviewed with pt and Olivia Mackie at [facility]. No questions or concerns at this time. Will follow up with PCP in 1 week for reevaluation.

## 2021-05-03 NOTE — Discharge Instructions (Addendum)
You were seen in the emergency department today for fall.  While you were here we scanned your head and neck as well as your hip which were all normal.  Please continue to participate in physical therapy to work on your strength.  Please make sure that you are using walking assistance as prescribed to you.

## 2021-05-03 NOTE — ED Provider Notes (Signed)
[facility] EMERGENCY DEPARTMENT Provider Note   CSN: [id number] Arrival date & time: 05/03/21  1051     History Chief Complaint  Patient presents with   Fall    Angela Galvan is a 87 y.o. female.  With past medical history of hypertension, hyperlipidemia, arthritis, stroke in 2008 without residual and frequent falls who presents emergency department with fall.  She states that this morning she was coming back from breakfast when she was ambulating from her walker to her bed.  She states that she stopped using her walker and attempted to ambulate over to sit down on the bed when she fell.  She states that she fell onto her left side and hit her head on the bed and dresser.  She states that now she is having a "slight headache" and mild pain in her left hip.  She denies shortness of breath, chest pain, palpitations, lightheadedness or dizziness prior to or after her fall.  She denies any recent illnesses.  She does state that she has had frequent falls as of recent.   Fall Associated symptoms include headaches.      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   Grade III, 3.5 cm, ER-/PR- left breast s/p mastectomy with SLN (0/1)   Depression    Diabetes mellitus    no meds.-diet controlled   Diarrhea    Diverticulitis    s/p colectomy in 1991   GERD (gastroesophageal reflux disease)    HOH (hard of hearing)    HTN (hypertension)    Does not see a cardiologist   Hyperlipidemia    Hypothyroidism    IBS (irritable bowel syndrome)    S/P colonoscopy 2003   minimal internal hemorrhoids, pancolonic diverticula, biopsy of rectum: lymphoplasmacytic microscopic colitis, colon biopsies normal   S/P colonoscopy 2012   pancolonic diverticula, nl TI, external hemorrhoidal tag,/rectum bx no abnormalities   S/P endoscopy 2012   normal  esophagus, stenotic pyloric channel, TTS dilation   Stroke ([place]) 08/10/2006   no residual    Patient Active Problem List   Diagnosis Date Noted   Restrictive lung disease 10/01/2020   Decreased diffusion capacity 10/01/2020   Shortness of breath 10/01/2020   Hypothyroidism 03/11/2019   Hyperlipidemia 03/11/2019   Menopause 03/11/2019   Encounter for screening for malignant neoplasm of breast 03/11/2019   Need for immunization against influenza 03/11/2019   Fecal incontinence 12/10/2018   Hoarseness 11/14/2018   Chronic cough    Syncope and collapse 11/03/2016   Type 2 diabetes mellitus without complication ([place])    Breast mass, right 01/29/2015   IBS (irritable bowel syndrome) 10/29/2014   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/07/2013   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   Psychophysiologic insomnia 06/20/2012   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/20/2012   Stiffness of joint, not elsewhere classified, lower leg 10/24/2011   Difficulty in walking(719.7) 10/24/2011   Pain in joint, lower leg 10/24/2011   OA (osteoarthritis) of knee 09/25/2011   GERD (gastroesophageal reflux disease) 09/15/2011   Epigastric pain 09/29/2010   Diarrhea 09/29/2010    Past Surgical History:  Procedure Laterality Date   ABDOMINAL HYSTERECTOMY  1983   ANAL RECTAL MANOMETRY N/A 01/06/2019   Procedure: ANO RECTAL MANOMETRY;  Surgeon:  Mauri Pole, MD;  Location: [facility];  Service: Endoscopy;  Laterality: N/A;   bilateral cataracts     BIOPSY  03/23/2016   Procedure: BIOPSY;  Surgeon: Daneil Dolin, MD;  Location: [facility];  Service: Endoscopy;;  colon    BREAST BIOPSY Left 01/10/2013   Procedure: BREAST BIOPSY;  Surgeon: Jamesetta So, MD;  Location: [facility];  Service: General;  Laterality: Left;   BREAST LUMPECTOMY Right 01/29/2015   Procedure: RIGHT BREAST LUMPECTOMY;  Surgeon: Fanny Skates, MD;  Location: [place];  Service: General;   Laterality: Right;   CHOLECYSTECTOMY     COLECTOMY     COLONOSCOPY  09/11/01   Minimal internal hemorrhoids, otherwise normal rectum . Scattered pancolonic diverticula (few) The colonic mucosa otherwise appeared normal status post biopsies and stool sampling as described above.   COLONOSCOPY N/A 03/23/2016   left colonic diverticulosis, otherwise normal.    ESOPHAGEAL MANOMETRY N/A 05/13/2018   Procedure: ESOPHAGEAL MANOMETRY (EM);  Surgeon: Mauri Pole, MD;  Location: [facility];  Service: Endoscopy;  Laterality: N/A;  Dr Julious Payer   ESOPHAGOGASTRODUODENOSCOPY  10/11/2010   Normal esophagus, small hiatal hernia,Stenotic pyloric channel likely critical.  Likely contributing to some of the patient's symptoms, status post TTS balloon dilation Normal D1-D3 status post biopsy.  D2-D3 atrophic-appearing gastric mucosa status post biopsy   ileocolonoscopy  10/11/2010   external hemorrhoidal tag.  Normal rectum status post biopsy Pancolonic diverticula status post segmental biopsy.  Normal  terminal ileum   JOINT REPLACEMENT Bilateral    KNEE ARTHROPLASTY Right 08-30-02   KNEE SURGERY Bilateral    08-30-2002- [place]- total; knee   [place] IMPEDANCE STUDY N/A 05/13/2018   Procedure: [place] IMPEDANCE STUDY;  Surgeon: Mauri Pole, MD;  Location: [facility];  Service: Endoscopy;  Laterality: N/A;  On PPI   right ovarian tumor removed as teenager     SIMPLE MASTECTOMY WITH AXILLARY SENTINEL NODE BIOPSY Left 03/07/2013   Procedure: TOTAL  MASTECTOMY WITH AXILLARY SENTINEL NODE BIOPSY;  Surgeon: Adin Hector, MD;  Location: [place];  Service: General;  Laterality: Left;   TOTAL KNEE ARTHROPLASTY  09/25/2011   Procedure: TOTAL KNEE ARTHROPLASTY;  Surgeon: Gearlean Alf, MD;  Location: [facility];  Service: Orthopedics;  Laterality: Left;     OB History   No obstetric history on file.     Family History  Problem Relation Age of Onset   Stroke Mother        deceased after 3 strokes 30-Aug-1999   Diabetes  Father    Heart attack Father    Stroke Maternal Grandmother        deceased after 3 strokes   Stroke Sister        twin sister   Colon cancer Neg Hx     Social History   Tobacco Use   Smoking status: Never   Smokeless tobacco: Never   Tobacco comments:    Never smoked  Vaping Use   Vaping Use: Never used  Substance Use Topics   Alcohol use: Yes    Alcohol/week: 1.0 standard drink    Types: 1 Glasses of wine per week    Comment: occassional wine   Drug use: No    Home Medications Prior to Admission medications   Medication Sig Start Date End Date Taking? Authorizing Provider  acetaminophen (TYLENOL) 500 MG tablet Take 500-1,000 mg by mouth every 6 (six) hours as needed for mild pain or moderate pain. Pain  [provider]  albuterol (VENTOLIN HFA) 108 (90 Base) MCG/ACT inhaler INHALE 2 PUFFS EVERY 6 HOURS AS NEEDED FOR WHEEZING OR SHORTNESS OF BREATH Patient taking differently: Inhale 2 puffs into the lungs every 6 (six) hours as needed for wheezing or shortness of breath. 07/29/19   Margaretha Seeds, MD  ALPRAZolam Duanne Moron) 0.5 MG tablet Take 0.5 mg by mouth 2 (two) times daily as needed for sleep or anxiety.    [provider]  aspirin 325 MG tablet Take 325 mg by mouth daily.    [provider]  aspirin EC 81 MG tablet Take 81 mg by mouth daily. Swallow whole.    [provider]  atorvastatin (LIPITOR) 10 MG tablet Take 1 tablet (10 mg total) by mouth daily after breakfast. 05/05/19   Corum, Rex Kras, MD  BREO ELLIPTA 200-25 MCG/INH AEPB Inhale 1 puff into the lungs daily. Patient not taking: Reported on 09/21/2020 10/28/19   [provider]  clidinium-chlordiazePOXIDE (LIBRAX) 5-2.5 MG capsule Use once daily in the morning. Further doses as needed up to 4 a day Patient taking differently: Take 1 capsule by mouth daily as needed (constipation). 02/11/19   Carlis Stable, NP  FLUoxetine (PROZAC) 20 MG capsule TAKE ONE CAPSULE BY MOUTH  ONCE DAILY. Patient taking differently: Take 20 mg by mouth every morning. 10/28/19   Corum, Rex Kras, MD  fluticasone (FLONASE) 50 MCG/ACT nasal spray Place 1 spray into both nostrils daily. Patient taking differently: Place 1 spray into both nostrils daily as needed for allergies or rhinitis. 08/25/19   Margaretha Seeds, MD  gabapentin (NEURONTIN) 100 MG capsule Take by mouth. 09/16/20   [provider]  gabapentin (NEURONTIN) 300 MG capsule TAKE 1 CAPSULE BY MOUTH 3 TIMES A DAY AS NEEDED Patient taking differently: Take 300 mg by mouth at bedtime. 06/02/19   Margaretha Seeds, MD  levothyroxine (SYNTHROID) 75 MCG tablet Take 1 tablet (75 mcg total) by mouth daily. Patient taking differently: Take 75 mcg by mouth daily before breakfast. 07/11/19   Corum, Rex Kras, MD  levothyroxine (SYNTHROID) 88 MCG tablet Take 88 mcg by mouth daily before breakfast.    [provider]  loperamide (IMODIUM) 2 MG capsule Take 2 mg by mouth as needed for diarrhea or loose stools.    [provider]  loratadine (CLARITIN) 10 MG tablet Take 10 mg by mouth daily.    [provider]  losartan (COZAAR) 100 MG tablet Take 1 tablet (100 mg total) by mouth daily. 05/05/19   Corum, Rex Kras, MD  meloxicam (MOBIC) 15 MG tablet Take 1 tablet by mouth daily. 07/05/20   [provider]  RABEprazole (ACIPHEX) 20 MG tablet TAKE 1 TABLET BY MOUTH 2 TIMES DAILY Patient taking differently: Take 20 mg by mouth in the morning and at bedtime. 12/26/18   Mahala Menghini, PA-C  RESTASIS 0.05 % ophthalmic emulsion Place 1 drop into both eyes 2 (two) times daily.  08/21/19   [provider]  sertraline (ZOLOFT) 100 MG tablet Take 100 mg by mouth daily.    [provider]  TRELEGY ELLIPTA 100-62.5-25 MCG/INH AEPB Inhale 1 puff into the lungs daily. 08/18/20   [provider]    Allergies    Patient has no known allergies.  Review of Systems   Review of Systems  Musculoskeletal:   Positive for arthralgias and myalgias. Negative for neck pain and neck stiffness.  Neurological:  Positive for headaches. Negative for dizziness,  syncope and light-headedness.  All other systems reviewed and are negative.  Physical Exam Updated Vital Signs BP (!) 170/64 (BP Location: Left Arm)   Pulse 68   Temp 97.7 F (36.5 C) (Oral)   Resp 18   Ht 5\' 5"  (1.651 m)   Wt 56 kg   SpO2 98%   BMI 20.54 kg/m   Physical Exam Vitals and nursing note reviewed.  Constitutional:      General: She is not in acute distress.    Appearance: Normal appearance. She is normal weight. She is not ill-appearing.  HENT:     Head: Normocephalic and atraumatic.     Comments: Fall with striking her head.  There are no ecchymoses or lacerations.    Nose: Nose normal.     Mouth/Throat:     Mouth: Mucous membranes are moist.     Pharynx: Oropharynx is clear.     Comments: Dentition intact Eyes:     General: No scleral icterus.    Extraocular Movements: Extraocular movements intact.     Pupils: Pupils are equal, round, and reactive to light.  Cardiovascular:     Rate and Rhythm: Normal rate and regular rhythm.     Pulses: Normal pulses.     Heart sounds: Normal heart sounds. No murmur heard. Pulmonary:     Effort: Pulmonary effort is normal. No respiratory distress.     Breath sounds: Normal breath sounds.  Abdominal:     General: Bowel sounds are normal. There is no distension.     Palpations: Abdomen is soft.     Tenderness: There is no abdominal tenderness.  Musculoskeletal:        General: Normal range of motion.     Cervical back: Normal range of motion and neck supple. No tenderness.  Skin:    General: Skin is warm and dry.     Capillary Refill: Capillary refill takes less than 2 seconds.     Findings: No bruising.  Neurological:     General: No focal deficit present.     Mental Status: She is alert and oriented to person, place, and time. Mental status is at baseline.     Cranial  Nerves: No cranial nerve deficit.     Sensory: No sensory deficit.  Psychiatric:        Mood and Affect: Mood normal.        Behavior: Behavior normal.        Thought Content: Thought content normal.        Judgment: Judgment normal.    ED Results / Procedures / Treatments   Labs (all labs ordered are listed, but only abnormal results are displayed) Labs Reviewed  BASIC METABOLIC PANEL - Abnormal; Notable for the following components:      Result Value   Sodium 132 (*)    CO2 20 (*)    Glucose, Bld 133 (*)    Creatinine, Ser 1.20 (*)    GFR, Estimated 44 (*)    All other components within normal limits  CBC WITH DIFFERENTIAL/PLATELET - Abnormal; Notable for the following components:   Abs Immature Granulocytes 0.26 (*)    All other components within normal limits    EKG None  Radiology CT HEAD WO CONTRAST (5MM)  Result Date: 05/03/2021 CLINICAL DATA:  Fall EXAM: CT HEAD WITHOUT CONTRAST TECHNIQUE: Contiguous axial images were obtained from the base of the skull through the vertex without intravenous contrast. COMPARISON:  None. FINDINGS: Brain: No acute intracranial hemorrhage. No focal  mass lesion. No CT evidence of acute infarction. No midline shift or mass effect. Moderate ventriculomegaly similar to comparison CT. Basilar cisterns are patent. There extensive periventricular and subcortical white matter hypodensities. Generalized cortical atrophy. Vascular: No hyperdense vessel or unexpected calcification. Skull: Normal. Negative for fracture or focal lesion. Sinuses/Orbits: Paranasal sinuses and mastoid air cells are clear. Orbits are clear. Other: None. IMPRESSION: 1. No acute intracranial findings. 2. No intracranial trauma. 3. Chronic atrophy and periventricular white matter microvascular disease. 4. Moderate ventriculomegaly similar to comparison CT Electronically Signed   By: Suzy Bouchard M.D.   On: 05/03/2021 12:58   CT Head Wo Contrast  Result Date:  05/03/2021 CLINICAL DATA:  Trauma, fall EXAM: CT HEAD WITHOUT CONTRAST TECHNIQUE: Contiguous axial images were obtained from the base of the skull through the vertex without intravenous contrast. COMPARISON:  Previous CT brain examinations including the study done earlier today FINDINGS: Brain: There are no signs of bleeding within the cranium. There is dilation of third and both lateral ventricles. Cortical sulci are prominent. There is decreased density in periventricular and subcortical white matter. Vascular: Unremarkable. Skull: Unremarkable. Sinuses/Orbits: Unremarkable. Other: None IMPRESSION: No acute intracranial findings are seen. Central and cortical atrophy. Small-vessel disease. Electronically Signed   By: Elmer Picker M.D.   On: 05/03/2021 12:54   CT Cervical Spine Wo Contrast  Result Date: 05/03/2021 CLINICAL DATA:  Trauma, fall EXAM: CT CERVICAL SPINE WITHOUT CONTRAST TECHNIQUE: Multidetector CT imaging of the cervical spine was performed without intravenous contrast. Multiplanar CT image reconstructions were also generated. COMPARISON:  08/25/2020 FINDINGS: Alignment: Alignment of posterior margins of vertebral bodies is unremarkable. There is mild dextroscoliosis. Skull base and vertebrae: No fracture is seen in the cervical spine. Small anterior bony spurs noted from C3-C7 levels. Soft tissues and spinal canal: There is no significant spinal stenosis. There is mild to moderate encroachment of neural foramina at C3-C4 level, more so on the right side. There is mild encroachment left neural foramen at C4-C5 level. There is minimal encroachment of left neural foramen at C5-C6 level. Disc levels:  As described above Upper chest: Pleural thickening is seen in the apices, more so on the right side. There is atrophy in the thyroid. Prevertebral soft tissues are unremarkable. Other: There is atrophy in the thyroid. IMPRESSION: No recent fracture is seen in the cervical spine. Degenerative  changes are noted with encroachment of neural foramina at multiple levels as described in the body of the report. There is no significant spinal stenosis. Mild dextroscoliosis. Pleural thickening is seen in the apices of both lungs, possibly scarring. There is atrophy in the thyroid. Electronically Signed   By: Elmer Picker M.D.   On: 05/03/2021 13:02    Procedures Procedures   Medications Ordered in ED Medications - No data to display  ED Course  I have reviewed the triage vital signs and the nursing notes.  Pertinent labs & imaging results that were available during my care of the patient were reviewed by me and considered in my medical decision making (see chart for details).    MDM Rules/Calculators/A&P 87 year old female who presents to the emergency department after mechanical fall.  She does have a history of recurrent falls.  She was last seen in the emergency department on 09/11/2020 and at that time it was recommended that she be discharged to SNF for physical therapy.  It appears she has stayed at [facility] since then.  On evaluation today we obtained basic labs and imaging.  There  are no metabolic derangements.  She has no urinary symptoms.  She denies having chest pain, palpitations, lightheadedness or dizziness, shortness of breath prior to falling.  Most likely this is simple mechanical fall.  She did state that she hit her head twice on the bed and the bedside table.  Obtain CT head which was negative for acute hemorrhage or infarction.  Also obtain CTA neck which was negative for fractures or listhesis. X-ray of the left hip without fracture or dislocation. She was observed for 5 hours.  She has had no change in her neurological status.  She did initially complain of mild headache however she feels improved at this time.  She will be discharged back to SNF.  She will benefit from ongoing physical therapy.  Vital signs are stable.  She is safe for discharge at this  time. Final Clinical Impression(s) / ED Diagnoses Final diagnoses:  Fall, subsequent encounter    Rx / DC Orders ED Discharge Orders     None        Mickie Hillier, PA-C 05/03/21 1607    Sherwood Gambler, MD 05/05/21 [phone]

## 2021-06-20 ENCOUNTER — Ambulatory Visit: Payer: Medicare Other | Admitting: Neurology

## 2021-06-20 ENCOUNTER — Telehealth: Payer: Self-pay | Admitting: Neurology

## 2021-06-20 VITALS — BP 120/66 | HR 88

## 2021-06-20 DIAGNOSIS — R413 Other amnesia: Secondary | ICD-10-CM

## 2021-06-20 DIAGNOSIS — R55 Syncope and collapse: Secondary | ICD-10-CM

## 2021-06-20 DIAGNOSIS — R296 Repeated falls: Secondary | ICD-10-CM

## 2021-06-20 DIAGNOSIS — G3184 Mild cognitive impairment, so stated: Secondary | ICD-10-CM

## 2021-06-20 NOTE — Telephone Encounter (Signed)
LVM on daughter's phone and informed Nanine Means of appointment change for EEG (tech [place] out of office). Moved appointment from 1/24 to 1/25 at 2:00 pm.

## 2021-06-20 NOTE — Telephone Encounter (Signed)
[employer] medicare no auth req-sent to [facility] to be scheduled at [facility]. They will reach out to the patient to schedule.

## 2021-06-20 NOTE — Progress Notes (Signed)
[facility] [address] [place]. [place] [address] [phone]       OFFICE CONSULT NOTE  [name] Date of Birth:  May 29, 1933 Medical Record Number:  [id number]   Referring MD: Cecilio Asper, FNP  Reason for Referral: Frequent falls  HPI: Angela Galvan is a 88 year old pleasant Caucasian lady seen today for office consultation visit for frequent falls and memory loss.  She is accompanied by her daughter as well as caregiver and history is obtained from them and review of electronic medical records and opossum reviewed available pertinent imaging films in PACS.  She has extensive past medical history as listed below.  She has had longstanding history of falling abruptly without warning.  Initial episodes were infrequent and in December 2017 and followed by [place] 2018.  Episodes occur mostly when she is trying to get up or sit down.  The first episode occurred when she was getting out of her car and fell down abruptly.  She remembered some passersby helping her back to her car and she had a lot of dirt in the hand.  She had fallen down a pothole but did not remember how that happened.  Second episode occurred in My[place] 2019 when she was planning to walk and remembers sitting up in a chair in a restaurant and EMS talking to her.  She had extensive neurovascular evaluation including CT scan and MRI findings EEG and cardiac monitoring all of it was negative. She  was seen by me in 2018 for these episodes and by cardiologist Dr. Jenkins Rouge and cardiac causes of syncope as well as neurovascular causes and EEG all benign.  She was then lost to follow-up.  She is at increased frequency of falls recently in the last 1 year she has had 31 falls.  She is presently living at assisted living facility.  She has been using a walker consistently.  She states she did not have any warning and is unable to hold off and most of falls have been either falling backwards or forwards.  She does not  lose consciousness and most of these episodes.  She has been to the ER multiple times and had CT scan of the C-spine and brain done which have been benign the last 1 being on 05/03/2021.  She denies any seizure-like activity, tongue bite injury or muscle soreness after these episodes.  She denies any other focal neurological symptoms like double vision, vertigo, nausea or focal weakness or numbness completing these episodes either.  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   Grade III, 3.5 cm, ER-/PR- left breast s/p mastectomy with SLN (0/1)   Depression    Diabetes mellitus    no meds.-diet controlled   Diarrhea    Diverticulitis    s/p colectomy in 1991   GERD (gastroesophageal reflux disease)    HOH (hard of hearing)    HTN (hypertension)    Does not see a  cardiologist   Hyperlipidemia    Hypothyroidism    IBS (irritable bowel syndrome)    S/P colonoscopy 2003   minimal internal hemorrhoids, pancolonic diverticula, biopsy of rectum: lymphoplasmacytic microscopic colitis, colon biopsies normal   S/P colonoscopy 2012   pancolonic diverticula, nl TI, external hemorrhoidal tag,/rectum bx no abnormalities   S/P endoscopy 2012   normal esophagus, stenotic pyloric channel, TTS dilation   Stroke ([facility]) 08/10/2006   no residual    Social History:  Social History   Socioeconomic History   Marital status: Widowed    Spouse name: Not on file   Number of children: Not on file   Years of education: Not on file    Highest education level: Not on file  Occupational History   Occupation: retired    Fish farm manager: RETIRED  Tobacco Use   Smoking status: Never   Smokeless tobacco: Never   Tobacco comments:    Never smoked  Vaping Use   Vaping Use: Never used  Substance and Sexual Activity   Alcohol use: Yes    Alcohol/week: 1.0 standard drink    Types: 1 Glasses of wine per week    Comment: occassional wine   Drug use: No   Sexual activity: Never  Other Topics Concern   Not on file  Social History Narrative   Not on file   Social Determinants of Health   Financial Resource Strain: Not on file  Food Insecurity: Not on file  Transportation Needs: Not on file  Physical Activity: Not on file  Stress: Not on file  Social Connections: Not on file  Intimate Partner Violence: Not on file    Medications:   Current Outpatient Medications on File Prior to Visit  Medication Sig Dispense Refill   albuterol (VENTOLIN HFA) 108 (90 Base) MCG/ACT inhaler INHALE 2 PUFFS EVERY 6 HOURS AS NEEDED FOR WHEEZING OR SHORTNESS OF BREATH (Patient taking differently: Inhale 2 puffs into the lungs every 6 (six) hours as needed for wheezing or shortness of breath.) 8.5 g 3   aspirin EC 81 MG tablet Take 81 mg by mouth daily. Swallow whole.     atorvastatin (LIPITOR) 10 MG tablet Take 1 tablet (10 mg total) by mouth daily after breakfast. 90 tablet 0   benzonatate (TESSALON) 200 MG capsule benzonatate 200 mg capsule  Take 1 capsule every 12 hours by oral route for 10 days.     BREO ELLIPTA 200-25 MCG/INH AEPB Inhale 1 puff into the lungs daily.     celecoxib (CELEBREX) 200 MG capsule celecoxib 200 mg capsule     diclofenac Sodium (VOLTAREN) 1 % GEL Apply topically.     ergocalciferol (VITAMIN D2) 1.25 MG (50000 UT) capsule ergocalciferol (vitamin D2) 1,250 mcg (50,000 unit) capsule  Take 1 capsule every day by oral route for 56 days.     levothyroxine (SYNTHROID) 75 MCG tablet Take 1 tablet (75 mcg total) by mouth  daily. (Patient taking differently: Take 75 mcg by mouth daily before breakfast.) 90 tablet 3   levothyroxine (SYNTHROID) 88 MCG tablet Take 88 mcg by mouth daily before breakfast.     loperamide (IMODIUM) 2 MG capsule Take 2 mg by mouth as needed for diarrhea or loose stools.     loratadine (CLARITIN) 10 MG tablet Take 10 mg by mouth daily.     losartan (COZAAR) 100 MG tablet Take 1 tablet (100 mg total) by mouth daily. 90 tablet 0   RABEprazole (ACIPHEX) 20 MG tablet TAKE 1 TABLET BY MOUTH  2 TIMES DAILY (Patient taking differently: Take 20 mg by mouth in the morning and at bedtime.) 180 tablet 2   RESTASIS 0.05 % ophthalmic emulsion Place 1 drop into both eyes 2 (two) times daily.      sertraline (ZOLOFT) 100 MG tablet Take 100 mg by mouth daily.     TRELEGY ELLIPTA 100-62.5-25 MCG/INH AEPB Inhale 1 puff into the lungs daily.     No current facility-administered medications on file prior to visit.    Allergies:  No Known Allergies  Physical Exam General: Pleasant frail elderly Caucasian lady, seated, in no evident distress Head: head normocephalic and atraumatic.   Neck: supple with no carotid or supraclavicular bruits Cardiovascular: regular rate and rhythm, no murmurs Musculoskeletal: no deformity Skin:  no rash/petichiae Vascular:  Normal pulses all extremities  Neurologic Exam Mental Status: Awake and fully alert. Oriented to place and time. Recent and remote memory intact. Attention span, concentration and fund of knowledge appropriate. Mood and affect appropriate.  Diminished recall 2/3.  Able to name 9 animals which can walk on 4 legs.  Clock drawing 4/4.  Glabellar tap is negative.  Palmomental reflex absent. Cranial Nerves: Fundoscopic exam reveals sharp disc margins. Pupils equal, briskly reactive to light. Extraocular movements full without nystagmus. Visual fields full to confrontation. Hearing intact. Facial sensation intact. Face, tongue, palate moves normally and  symmetrically.  Motor: Normal bulk and tone. Normal strength in all tested extremity muscles.  No resting tremor or cogwheel rigidity or bradykinesia. Sensory.: intact to touch , pinprick , position and vibratory sensation.  Coordination: Rapid alternating movements normal in all extremities. Finger-to-nose and heel-to-shin performed accurately bilaterally.  Poor postural balance and would fall if not caught when pushed backwards.  Retropulsion present.  . Gait and Station: Arises from chair with some difficulty. Stance is slightly stooped .  Uses a wheeled walker and walks with a cautious gait Reflexes: 1+ and symmetric. Toes downgoing.   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  She has had extensive negative neurovascular and cardiac work-up in the past she also has memory loss and mild age-related cognitive impairment.     PLAN:I had a long discussion with the patient, her daughter and a caregiver regarding her frequent falls as well as mild memory loss and cognitive impairment and answered questions.  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  Check MRI scan of the brain as well as CT angiogram of brain and neck, EEG and dementia panel labs.  I also encourage her to increase participation in cognitively challenging activities like solving crossword puzzles, playing bridge and sodoku.  She will continue on aspirin for stroke prevention and maintain aggressive risk factor modification with strict blood pressure control systolic goal below 301/60, lipids with LDL cholesterol goal below 70 mg percent and diabetes with hemoglobin A1c goal below 6.5%.  She will return for follow-up in the future in 4 months with my nurse practitioner Janett Billow  or call earlier if necessary. Greater than 50% time during this 45-minute consultation visit was spent on counseling and coordination of care about her frequent falls as well as mild cognitive impairment and memory loss and answering questions. Antony Contras, MD Note: This document  was prepared with digital dictation and possible smart phrase technology. Any transcriptional errors that result from this process are unintentional.

## 2021-06-20 NOTE — Telephone Encounter (Signed)
Daughter called to reschedule appt due to not able to come on 1/25. Scheduled appt on 1/26.

## 2021-06-20 NOTE — Patient Instructions (Addendum)
I had a long discussion with the patient, her daughter and a caregiver regarding her frequent falls as well as mild memory loss and cognitive impairment and answered questions.  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  Check MRI scan of the brain as well as CT angiogram of brain and neck, EEG and dementia panel labs.  I also encourage her to increase participation in cognitively challenging activities like solving crossword puzzles, playing bridge and sodoku.  She will continue on aspirin for stroke prevention and maintain aggressive risk factor modification with strict blood pressure control systolic goal below 998/33, lipids with LDL cholesterol goal below 70 mg percent and diabetes with hemoglobin A1c goal below 6.5%.  She will return for follow-up in the future in 4 months with my nurse practitioner Janett Billow or call earlier if necessary. Fall Prevention in the Home, Adult Falls can cause injuries and can happen to people of all ages. There are many things you can do to make your home safe and to help prevent falls. Ask for help when making these changes. What actions can I take to prevent falls? General Instructions Use good lighting in all rooms. Replace any light bulbs that burn out. Turn on the lights in dark areas. Use night-lights. Keep items that you use often in easy-to-reach places. Lower the shelves around your home if needed. Set up your furniture so you have a clear path. Avoid moving your furniture around. Do not have throw rugs or other things on the floor that can make you trip. Avoid walking on wet floors. If any of your floors are uneven, fix them. Add color or contrast paint or tape to clearly mark and help you see: Grab bars or handrails. First and last steps of staircases. Where the edge of each step is. If you use a  stepladder: Make sure that it is fully opened. Do not climb a closed stepladder. Make sure the sides of the stepladder are locked in place. Ask someone to hold the stepladder while you use it. Know where your pets are when moving through your home. What can I do in the bathroom?   Keep the floor dry. Clean up any water on the floor right away. Remove soap buildup in the tub or shower. Use nonskid mats or decals on the floor of the tub or shower. Attach bath mats securely with double-sided, nonslip rug tape. If you need to sit down in the shower, use a plastic, nonslip stool. Install grab bars by the toilet and in the tub and shower. Do not use towel bars as grab bars. What can I do in the bedroom? Make sure that you have a light by your bed that is easy to reach. Do not use any sheets or blankets for your bed that hang to the floor. Have a firm chair with side arms that you can use for support when you get dressed. What can I do in the kitchen? Clean up any spills right away. If you need to reach something above you, use a step stool with a grab bar. Keep electrical cords out of the way. Do not use floor polish or wax that makes floors slippery. What can I do with my stairs? Do not leave any items on the stairs. Make sure that you have a light switch  at the top and the bottom of the stairs. Make sure that there are handrails on both sides of the stairs. Fix handrails that are broken or loose. Install nonslip stair treads on all your stairs. Avoid having throw rugs at the top or bottom of the stairs. Choose a carpet that does not hide the edge of the steps on the stairs. Check carpeting to make sure that it is firmly attached to the stairs. Fix carpet that is loose or worn. What can I do on the outside of my home? Use bright outdoor lighting. Fix the edges of walkways and driveways and fix any cracks. Remove anything that might make you trip as you walk through a door, such as a  raised step or threshold. Trim any bushes or trees on paths to your home. Check to see if handrails are loose or broken and that both sides of all steps have handrails. Install guardrails along the edges of any raised decks and porches. Clear paths of anything that can make you trip, such as tools or rocks. Have leaves, snow, or ice cleared regularly. Use sand or salt on paths during winter. Clean up any spills in your garage right away. This includes grease or oil spills. What other actions can I take? Wear shoes that: Have a low heel. Do not wear high heels. Have rubber bottoms. Feel good on your feet and fit well. Are closed at the toe. Do not wear open-toe sandals. Use tools that help you move around if needed. These include: Canes. Walkers. Scooters. Crutches. Review your medicines with your doctor. Some medicines can make you feel dizzy. This can increase your chance of falling. Ask your doctor what else you can do to help prevent falls. Where to find more information Centers for Disease Control and Prevention, STEADI: http://www.wolf.info/ National Institute on Aging: http://kim-miller.com/ Contact a doctor if: You are afraid of falling at home. You feel weak, drowsy, or dizzy at home. You fall at home. Summary There are many simple things that you can do to make your home safe and to help prevent falls. Ways to make your home safe include removing things that can make you trip and installing grab bars in the bathroom. Ask for help when making these changes in your home. This information is not intended to replace advice given to you by your health care provider. Make sure you discuss any questions you have with your health care provider. Document Revised: 12/17/2019 Document Reviewed: 12/17/2019 Elsevier Patient Education  [place].

## 2021-06-21 ENCOUNTER — Other Ambulatory Visit: Payer: Medicare Other | Admitting: *Deleted

## 2021-06-21 LAB — DEMENTIA PANEL
Homocysteine: 18.8 umol/L (ref 0.0–21.3)
RPR Ser Ql: NONREACTIVE
TSH: 0.478 u[IU]/mL (ref 0.450–4.500)
Vitamin B-12: 395 pg/mL (ref 232–1245)

## 2021-06-22 ENCOUNTER — Other Ambulatory Visit: Payer: Medicare Other | Admitting: *Deleted

## 2021-06-23 ENCOUNTER — Ambulatory Visit (INDEPENDENT_AMBULATORY_CARE_PROVIDER_SITE_OTHER): Payer: Medicare Other | Admitting: Neurology

## 2021-06-23 DIAGNOSIS — R410 Disorientation, unspecified: Secondary | ICD-10-CM | POA: Diagnosis not present

## 2021-06-23 DIAGNOSIS — R413 Other amnesia: Secondary | ICD-10-CM

## 2021-07-07 ENCOUNTER — Ambulatory Visit (HOSPITAL_COMMUNITY)
Admission: RE | Admit: 2021-07-07 | Discharge: 2021-07-07 | Disposition: A | Discharge status: Home / Self Care | Payer: Medicare Other | Source: Ambulatory Visit | Attending: Neurology | Admitting: Neurology

## 2021-07-07 ENCOUNTER — Other Ambulatory Visit: Payer: Self-pay

## 2021-07-07 DIAGNOSIS — R413 Other amnesia: Secondary | ICD-10-CM | POA: Diagnosis present

## 2021-07-07 DIAGNOSIS — R55 Syncope and collapse: Secondary | ICD-10-CM | POA: Insufficient documentation

## 2021-07-07 LAB — POCT I-STAT CREATININE: Creatinine, Ser: 1.3 mg/dL — ABNORMAL HIGH (ref 0.44–1.00)

## 2021-07-07 MED ORDER — GADOBUTROL 1 MMOL/ML IV SOLN
5.0000 mL | Freq: Once | INTRAVENOUS | Status: AC | PRN
  Administered 2021-07-07: 5 mL via INTRAVENOUS

## 2021-07-11 ENCOUNTER — Telehealth: Payer: Self-pay

## 2021-07-11 NOTE — Progress Notes (Signed)
Please call and inform patient that her MRI brain does not show any acute stroke, tumor or bleeding. There is generalized atrophy and small vessel disease likely due to age.   Alric Ran, MD

## 2021-07-11 NOTE — Progress Notes (Signed)
Please call and inform patient the CT angiogram head and neck does not show stenosis in the major vessels and no vertebrobasilar insufficiency.   Alric Ran, MD

## 2021-07-11 NOTE — Telephone Encounter (Signed)
-----   Message from Alric Ran, MD sent at 07/11/2021  4:20 PM EST ----- Please call and inform patient that her MRI brain does not show any acute stroke, tumor or bleeding. There is generalized atrophy and small vessel disease likely due to age.   Alric Ran, MD

## 2021-07-11 NOTE — Telephone Encounter (Signed)
-----   Message from Alric Ran, MD sent at 07/11/2021  4:18 PM EST ----- Please call and inform patient the CT angiogram head and neck does not show stenosis in the major vessels and no vertebrobasilar insufficiency.   Alric Ran, MD

## 2021-07-11 NOTE — Telephone Encounter (Signed)
This encounter was created in error - please disregard.

## 2021-07-11 NOTE — Telephone Encounter (Signed)
I called patient to discuss. No answer, left a message asking her to call us back. If patient calls back another day please route to POD 2. 

## 2021-07-12 ENCOUNTER — Telehealth: Payer: Self-pay | Admitting: *Deleted

## 2021-07-12 ENCOUNTER — Encounter: Payer: Self-pay | Admitting: *Deleted

## 2021-07-12 NOTE — Telephone Encounter (Signed)
-----   Message from Alric Ran, MD sent at 07/11/2021  4:18 PM EST ----- Please call and inform patient the CT angiogram head and neck does not show stenosis in the major vessels and no vertebrobasilar insufficiency.   Alric Ran, MD

## 2021-07-12 NOTE — Telephone Encounter (Signed)
Pt verified by name and DOB, results given per provider, pt voiced understanding all question answered. 

## 2021-08-05 ENCOUNTER — Telehealth: Payer: Self-pay | Admitting: Pulmonary Disease

## 2021-08-05 NOTE — Telephone Encounter (Signed)
Noted. Patient scheduled for 08/09/21. ?

## 2021-08-05 NOTE — Telephone Encounter (Signed)
Stated the patient is using her prn inhaler more, her O2 is at 94% while sitting. They think it's more of an anxiety thing that's bringing on the SOB, She been talking about her deceased husband, and she also concerned with not being able to finish a blanket she's working on. ?

## 2021-08-05 NOTE — Telephone Encounter (Signed)
Routing to Dr. [name] as Angela Galvan ? ?Stated the patient is using her prn inhaler more, her O2 is at 94% while sitting. They think it's more of an anxiety thing that's bringing on the SOB, She been talking about her deceased husband, and she also concerned with not being able to finish a blanket she's working on. ?

## 2021-08-09 ENCOUNTER — Ambulatory Visit (INDEPENDENT_AMBULATORY_CARE_PROVIDER_SITE_OTHER): Payer: Medicare Other

## 2021-08-09 ENCOUNTER — Ambulatory Visit (INDEPENDENT_AMBULATORY_CARE_PROVIDER_SITE_OTHER): Payer: Medicare Other | Admitting: Pulmonary Disease

## 2021-08-09 ENCOUNTER — Other Ambulatory Visit: Payer: Self-pay

## 2021-08-09 ENCOUNTER — Encounter: Payer: Self-pay | Admitting: Pulmonary Disease

## 2021-08-09 VITALS — BP 124/72 | HR 74 | Temp 97.7°F | Ht 63.0 in | Wt 118.2 lb

## 2021-08-09 DIAGNOSIS — R29898 Other symptoms and signs involving the musculoskeletal system: Secondary | ICD-10-CM | POA: Diagnosis not present

## 2021-08-09 DIAGNOSIS — R0602 Shortness of breath: Secondary | ICD-10-CM | POA: Diagnosis not present

## 2021-08-09 NOTE — Patient Instructions (Addendum)
Shortness of breath ?--CXR reviewed. No pneumonia ?--Ambulatory O2 with no desaturations however only able to complete one lap ?--Will order overnight oximetry  ?--ARRANGE pulmonary function tests ?--REFILLED Trelegy 100-62.5-25 mcg ONE puff ONCE a day ? ?Follow-up with me in April or May with PFTs prior ?

## 2021-08-09 NOTE — Progress Notes (Signed)
? ?Synopsis: Referred in 04/2018 for chronic cough ? ?Subjective:  ? ?PATIENT ID: Angela Galvan GENDER: female DOB: [birth year], MRN: [id number] ? ? ?HPI ? ?Chief Complaint  ?Patient presents with  ? Follow-up  ?  Increased SOB maybe r/t anxiety   ? ?Angela Galvan is a 88 year old female never smoker with type 2 diabetes, osteoarthritis, GERD presents for follow-up. ? ?She has previously seen me for cough and has been treated with Breo and gabapentin.  On her last visit we tried to reduce her Breo to every other day however she noticed increased coughing and had a telemetry visit with PCCM-NP.  Advised to resume daily Breo and her cough has improved.  Continues to remain unproductive but is not near the severity as it was before.  Her primary concern for this visit is her persistent hoarseness.  She has an appointment with ENT scheduled for next week.  She used to sing in choir at church however since she had developed her cough, she has not been able to participate.  She notices that after a few minutes of talking she can lose her voice.  Her hoarseness worsens with emotion.  Denies audible wheezing or shortness of breath.  She takes Claritin for her allergies.  Denies rhinorrhea.  Reports that her reflux is well controlled. ? ?09/21/20 ?Our last visit was on 08/25/19. She was recently started on Trelegy to help with shortness of breath. Will have nonproductive cough. Denies wheezing or chest congestion. ?She has been falling multiple in the last year requiring ED visits and currently living in assisted living. Denies shortness of breath or cough associated with falls. Has been worked up by Neurology with no cause. Ambulates with a roller walker. During covid she was confined to her room, limiting her activity. She expresses being afraid to move.  ? ?08/09/21 ?Since our last visit she graduated Pulmonary rehab last year. She is compliant with Trelegy daily. Uses albuterol 2-3 times a day for shortness of breath with  exertion walking and any activity. At night she struggles to breath, panting at times. Has a chronic hacking cough when she eats. She has been recently treated with Buspar for anxiety that seems to help.  ? 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  ? ? ? ?Past Medical History:  ?Diagnosis Date  ? Anxiety   ? Arthritis   ? Asthma   ? Cancer [facility])   ? breast cancer left with mastectomy  ? Cataract   ? 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 Grade III, 3.5 cm, ER-/PR- left breast s/p mastectomy with SLN (0/1)  ? Depression   ? Diabetes mellitus   ? no meds.-diet controlled  ? Diarrhea   ? Diverticulitis   ? s/p colectomy in 1991  ? GERD (gastroesophageal reflux disease)   ? HOH (hard of hearing)   ? HTN (hypertension)   ? Does not see a cardiologist  ? Hyperlipidemia   ? Hypothyroidism   ? IBS (irritable bowel syndrome)   ? S/P colonoscopy 2003  ? minimal internal hemorrhoids, pancolonic diverticula, biopsy of rectum: lymphoplasmacytic microscopic colitis, colon biopsies normal  ? S/P colonoscopy 2012  ? pancolonic diverticula, nl TI, external hemorrhoidal tag,/rectum bx no abnormalities  ? S/P endoscopy 2012  ? normal esophagus, stenotic pyloric channel, TTS dilation  ? Stroke ([place]) 08/10/2006  ? no residual  ?  ? ?Outpatient Medications Prior to Visit  ?Medication Sig Dispense Refill  ? albuterol (VENTOLIN HFA)  108 (90 Base) MCG/ACT inhaler INHALE 2 PUFFS EVERY 6 HOURS AS NEEDED FOR WHEEZING OR SHORTNESS OF BREATH (Patient taking differently: Inhale 2 puffs into the lungs every 6 (six) hours as needed for wheezing or shortness of breath.) 8.5 g 3  ? aspirin EC 81 MG tablet Take 81 mg by mouth daily. Swallow whole.    ? atorvastatin (LIPITOR) 10 MG tablet Take 1 tablet (10 mg total) by mouth daily after breakfast. 90 tablet 0  ? benzonatate (TESSALON) 200 MG capsule benzonatate 200 mg capsule ? Take 1 capsule every 12 hours by oral route for 10 days.    ? BREO ELLIPTA 200-25 MCG/INH AEPB  Inhale 1 puff into the lungs daily.    ? busPIRone (BUSPAR) 5 MG tablet Take 5 mg by mouth 2 (two) times daily.    ? celecoxib (CELEBREX) 200 MG capsule celecoxib 200 mg capsule    ? diclofenac Sodium (VOLTAREN) 1 % GEL Apply topically.    ? ergocalciferol (VITAMIN D2) 1.25 MG (50000 UT) capsule ergocalciferol (vitamin D2) 1,250 mcg (50,000 unit) capsule ? Take 1 capsule every day by oral route for 56 days.    ? gabapentin (NEURONTIN) 100 MG capsule Take by mouth.    ? Ibuprofen 200 MG CAPS Advil    ? loperamide (IMODIUM) 2 MG capsule Take 2 mg by mouth as needed for diarrhea or loose stools.    ? loratadine (CLARITIN) 10 MG tablet Take 10 mg by mouth daily.    ? losartan (COZAAR) 100 MG tablet Take 1 tablet (100 mg total) by mouth daily. 90 tablet 0  ? RABEprazole (ACIPHEX) 20 MG tablet TAKE 1 TABLET BY MOUTH 2 TIMES DAILY (Patient taking differently: Take 20 mg by mouth in the morning and at bedtime.) 180 tablet 2  ? RESTASIS 0.05 % ophthalmic emulsion Place 1 drop into both eyes 2 (two) times daily.     ? sertraline (ZOLOFT) 100 MG tablet Take 100 mg by mouth daily.    ? TRELEGY ELLIPTA 100-62.5-25 MCG/INH AEPB Inhale 1 puff into the lungs daily.    ? levothyroxine (SYNTHROID) 75 MCG tablet Take 1 tablet (75 mcg total) by mouth daily. (Patient not taking: Reported on 08/09/2021) 90 tablet 3  ? levothyroxine (SYNTHROID) 88 MCG tablet Take 88 mcg by mouth daily before breakfast. (Patient not taking: Reported on 08/09/2021)    ? loperamide (IMODIUM) 1 MG/5ML solution Imodium 1 mg/5 mL oral liquid ? Take 30 mL 4 times a day by oral route as needed. (Patient not taking: Reported on 08/09/2021)    ? ?No facility-administered medications prior to visit.  ? ? ?Review of Systems  ?Constitutional:  Negative for chills, diaphoresis, fever, malaise/fatigue and weight loss.  ?HENT:  Negative for congestion.   ?Respiratory:  Positive for shortness of breath. Negative for cough, hemoptysis, sputum production and wheezing.    ?Cardiovascular:  Negative for chest pain, palpitations and leg swelling.  ?Psychiatric/Behavioral:  The patient is nervous/anxious.   ? ? ?Objective:  ? ? ?Vitals:  ? 08/09/21 1415  ?BP: 124/72  ?Pulse: 74  ?Temp: 97.7 ?F (36.5 ?C)  ?TempSrc: Oral  ?SpO2: 97%  ?Weight: 118 lb 3.2 oz (53.6 kg)  ?Height: '5\' 3"'$  (1.6 m)  ? ?SpO2: 97 % ?O2 Device: None (Room air) ? ?Physical Exam: ?General: Elderly-appearing, no acute distress ?HENT: [place], AT ?Eyes: EOMI, no scleral icterus ?Respiratory: Clear to auscultation bilaterally.  No crackles, wheezing or rales ?Cardiovascular: RRR, -M/R/G, no JVD ?Extremities:-Edema,-tenderness ?Neuro: AAO x4,  CNII-XII grossly intact ?Psych: Normal mood, normal affect ? ? ?Chest imaging:  ?CT Chest 06/20/18 - No significant GGO, septal thickening, subpleural reticulation, traction bronchiectasis or honeycombing present. ?CXR 08/09/21 - No infiltrate effusion or edema. Right hemidiaphragm eventration. Unchanged compared to 04/23/20 ? ?PFT:  ?06/06/18- FEV1/FVC 83 FEV1 1.42 (90%) FVC 1.71 (80%). TLC 70 No sig BD. DLCO 56%.  ?Borderline mild restrictive defect with reduced DLCO may suggest early ILD. Will need to clinically correlate ?   ?Assessment & Plan:  ? ?Shortness of breath, multifactorial  ?Restrictive defect with isolated diffusion defect ?Chronic cough ?Hoarseness - unchanged ?Deconditioning ? ?Discussion: ?88 year old female never smoker who presents for follow-up. Initially seen by me for chronic cough which improved with ICS/LABA however in the 12-18 months had worsening shortness of breath. She was stepped up to Trelegy. Prior PFTs in 2020 with mild restrictive defect/reduced DLCO however chest imaging with no parenchymal disease. Concerned for deconditioning being the culprit of her symptoms but will also repeat PFTs to rule out interval development/worsening of lung disease. ? ?Shortness of breath ?--CXR reviewed. No pneumonia ?--Ambulatory O2 with no desaturations however only able to  complete one lap ?--Will order overnight oximetry  ?--ARRANGE pulmonary function tests ?--REFILLED Trelegy 100-62.5-25 mcg ONE puff ONCE a day ? ?Orders Placed This Encounter  ?Procedures  ? DG Chest 2 View  ?

## 2021-08-11 ENCOUNTER — Telehealth: Payer: Self-pay | Admitting: Pulmonary Disease

## 2021-08-11 NOTE — Telephone Encounter (Signed)
Called [facility] and spoke with [place]. She states she needed OV notes that states the pt needed ONO. OV note from 3/14 has been electronically faxed to [facility]. Nothing further needed at this time.  ?

## 2021-08-15 ENCOUNTER — Encounter: Payer: Self-pay | Admitting: Pulmonary Disease

## 2021-08-15 DIAGNOSIS — R29898 Other symptoms and signs involving the musculoskeletal system: Secondary | ICD-10-CM | POA: Insufficient documentation

## 2021-08-31 ENCOUNTER — Telehealth: Payer: Self-pay | Admitting: Pulmonary Disease

## 2021-08-31 NOTE — Telephone Encounter (Signed)
[facility] Telephone Encounter ? ?SpO2 <88%. Nadir 82%.  ?No significant desaturations for >5 minutes ?No indication for oxygen. ? ?Detailed voicemail (per DPR) was left with above results.  ?

## 2021-09-18 ENCOUNTER — Other Ambulatory Visit: Payer: Self-pay

## 2021-09-18 ENCOUNTER — Emergency Department (HOSPITAL_COMMUNITY)
Admission: EM | Admit: 2021-09-18 | Discharge: 2021-09-18 | Disposition: A | Discharge status: Home / Self Care | Payer: Medicare Other | Attending: Emergency Medicine | Admitting: Emergency Medicine

## 2021-09-18 ENCOUNTER — Emergency Department (HOSPITAL_COMMUNITY): Payer: Medicare Other

## 2021-09-18 ENCOUNTER — Encounter (HOSPITAL_COMMUNITY): Payer: Self-pay

## 2021-09-18 DIAGNOSIS — Z7982 Long term (current) use of aspirin: Secondary | ICD-10-CM | POA: Insufficient documentation

## 2021-09-18 DIAGNOSIS — W1839XA Other fall on same level, initial encounter: Secondary | ICD-10-CM | POA: Diagnosis not present

## 2021-09-18 DIAGNOSIS — Y999 Unspecified external cause status: Secondary | ICD-10-CM | POA: Insufficient documentation

## 2021-09-18 DIAGNOSIS — R296 Repeated falls: Secondary | ICD-10-CM | POA: Diagnosis not present

## 2021-09-18 DIAGNOSIS — W19XXXA Unspecified fall, initial encounter: Secondary | ICD-10-CM

## 2021-09-18 DIAGNOSIS — Y92198 Other place in other specified residential institution as the place of occurrence of the external cause: Secondary | ICD-10-CM | POA: Insufficient documentation

## 2021-09-18 DIAGNOSIS — S0990XA Unspecified injury of head, initial encounter: Secondary | ICD-10-CM

## 2021-09-18 DIAGNOSIS — Y9389 Activity, other specified: Secondary | ICD-10-CM | POA: Diagnosis not present

## 2021-09-18 DIAGNOSIS — S0003XA Contusion of scalp, initial encounter: Secondary | ICD-10-CM | POA: Insufficient documentation

## 2021-09-18 NOTE — ED Provider Notes (Signed)
?[facility] ?Provider Note ? ? ?CSN: [id number] ?Arrival date & time: 09/18/21  2151 ? ?  ? ?History ? ?Chief Complaint  ?Patient presents with  ? Fall  ? ? ?Angela Galvan is a 88 y.o. female.  She is here for evaluation of head injury after a fall.  She lives at [facility] assisted living and fell coming from the bathroom to her bedroom.  She said she hit her head but did not lose consciousness.  She does not know why she fell but she said she has been following for the last 5 years and nobody can figure it out.  She said she fell earlier in the day to and landed on her butt but that does not hurt.  She is on an aspirin.  She denies any chest pain abdominal pain numbness weakness or urinary symptoms.  She has a DNR with her.  Her family member is with her and states she is at baseline. ? ?The history is provided by the patient and a relative.  ?Fall ?This is a recurrent problem. The problem has not changed since onset.Associated symptoms include headaches. Pertinent negatives include no chest pain, no abdominal pain and no shortness of breath. Nothing aggravates the symptoms. Nothing relieves the symptoms. She has tried nothing for the symptoms. The treatment provided no relief.  ? ?  ? ?Home Medications ?Prior to Admission medications   ?Medication Sig Start Date End Date Taking? Authorizing Provider  ?albuterol (VENTOLIN HFA) 108 (90 Base) MCG/ACT inhaler INHALE 2 PUFFS EVERY 6 HOURS AS NEEDED FOR WHEEZING OR SHORTNESS OF BREATH ?Patient taking differently: Inhale 2 puffs into the lungs every 6 (six) hours as needed for wheezing or shortness of breath. 07/29/19   Margaretha Seeds, MD  ?aspirin EC 81 MG tablet Take 81 mg by mouth daily. Swallow whole.    [provider]  ?atorvastatin (LIPITOR) 10 MG tablet Take 1 tablet (10 mg total) by mouth daily after breakfast. 05/05/19   Corum, Rex Kras, MD  ?benzonatate (TESSALON) 200 MG capsule benzonatate 200 mg capsule ? Take 1 capsule every 12  hours by oral route for 10 days.    [provider]  ?busPIRone (BUSPAR) 5 MG tablet Take 5 mg by mouth 2 (two) times daily. 08/06/21   [provider]  ?celecoxib (CELEBREX) 200 MG capsule celecoxib 200 mg capsule    [provider]  ?diclofenac Sodium (VOLTAREN) 1 % GEL Apply topically. 06/01/21   [provider]  ?ergocalciferol (VITAMIN D2) 1.25 MG (50000 UT) capsule ergocalciferol (vitamin D2) 1,250 mcg (50,000 unit) capsule ? Take 1 capsule every day by oral route for 56 days.    [provider]  ?gabapentin (NEURONTIN) 100 MG capsule Take by mouth. 07/21/21   [provider]  ?Ibuprofen 200 MG CAPS Advil    [provider]  ?levothyroxine (SYNTHROID) 75 MCG tablet Take 1 tablet (75 mcg total) by mouth daily. ?Patient not taking: Reported on 08/09/2021 07/11/19   Maryruth Hancock, MD  ?levothyroxine (SYNTHROID) 88 MCG tablet Take 88 mcg by mouth daily before breakfast. ?Patient not taking: Reported on 08/09/2021    [provider]  ?loperamide (IMODIUM) 1 MG/5ML solution Imodium 1 mg/5 mL oral liquid ? Take 30 mL 4 times a day by oral route as needed. ?Patient not taking: Reported on 08/09/2021    [provider]  ?loperamide (IMODIUM) 2 MG capsule Take 2 mg by mouth as needed for diarrhea or loose stools.  [provider]  ?loratadine (CLARITIN) 10 MG tablet Take 10 mg by mouth daily.    [provider]  ?losartan (COZAAR) 100 MG tablet Take 1 tablet (100 mg total) by mouth daily. 05/05/19   Maryruth Hancock, MD  ?RABEprazole (ACIPHEX) 20 MG tablet TAKE 1 TABLET BY MOUTH 2 TIMES DAILY ?Patient taking differently: Take 20 mg by mouth in the morning and at bedtime. 12/26/18   Mahala Menghini, PA-C  ?RESTASIS 0.05 % ophthalmic emulsion Place 1 drop into both eyes 2 (two) times daily.  08/21/19   [provider]  ?sertraline (ZOLOFT) 100 MG tablet Take 100 mg by mouth daily.    [provider]  ?Donnal Debar  100-62.5-25 MCG/INH AEPB Inhale 1 puff into the lungs daily. 08/18/20   [provider]  ?   ? ?Allergies    ?Patient has no known allergies.   ? ?Review of Systems   ?Review of Systems  ?Constitutional:  Negative for fever.  ?HENT:  Negative for sore throat.   ?Eyes:  Negative for visual disturbance.  ?Respiratory:  Negative for shortness of breath.   ?Cardiovascular:  Negative for chest pain.  ?Gastrointestinal:  Negative for abdominal pain.  ?Genitourinary:  Negative for dysuria.  ?Musculoskeletal:  Negative for neck pain.  ?Skin:  Negative for rash and wound.  ?Neurological:  Positive for headaches.  ? ?Physical Exam ?Updated Vital Signs ?BP (!) 189/75   Pulse 63   Temp (!) 97.4 ?F (36.3 ?C) (Oral)   Resp 20   SpO2 100%  ?Physical Exam ?Vitals and nursing note reviewed.  ?Constitutional:   ?   General: She is not in acute distress. ?   Appearance: Normal appearance. She is well-developed.  ?HENT:  ?   Head: Normocephalic.  ?   Comments: She has a small hematoma to the right parietal scalp. ?Eyes:  ?   Conjunctiva/sclera: Conjunctivae normal.  ?Cardiovascular:  ?   Rate and Rhythm: Normal rate and regular rhythm.  ?   Heart sounds: No murmur heard. ?Pulmonary:  ?   Effort: Pulmonary effort is normal. No respiratory distress.  ?   Breath sounds: Normal breath sounds.  ?Abdominal:  ?   Palpations: Abdomen is soft.  ?   Tenderness: There is no abdominal tenderness. There is no guarding or rebound.  ?Musculoskeletal:     ?   General: No swelling or tenderness. Normal range of motion.  ?   Cervical back: Neck supple. No tenderness.  ?   Right lower leg: No edema.  ?   Left lower leg: No edema.  ?   Comments: Full range of motion of upper and lower extremities without any pain or limitations.  ?Skin: ?   General: Skin is warm and dry.  ?   Capillary Refill: Capillary refill takes less than 2 seconds.  ?Neurological:  ?   General: No focal deficit present.  ?   Mental Status: She is alert.  ? ? ?ED Results /  Procedures / Treatments   ?Labs ?(all labs ordered are listed, but only abnormal results are displayed) ?Labs Reviewed - No data to display ? ?EKG ?None ? ?Radiology ?CT Head Wo Contrast ? ?Result Date: 09/18/2021 ?CLINICAL DATA:  Head trauma EXAM: CT HEAD WITHOUT CONTRAST TECHNIQUE: Contiguous axial images were obtained from the base of the skull through the vertex without intravenous contrast. RADIATION DOSE REDUCTION: This exam was performed according to the departmental dose-optimization program which includes automated exposure control, adjustment of  the mA and/or kV according to patient size and/or use of iterative reconstruction technique. COMPARISON:  CT brain 07/07/2021, 05/03/2021, MRI 07/07/2021 FINDINGS: Brain: No acute territorial infarction, hemorrhage or intracranial mass. Atrophy and extensive white matter disease. Stable ventricle size. Vascular: No hyperdense vessels. Vertebral and carotid vascular calcification Skull: Normal. Negative for fracture or focal lesion. Sinuses/Orbits: No acute finding. Other: None IMPRESSION: 1. No CT evidence for acute intracranial abnormality. 2. Atrophy and chronic small vessel ischemic changes of the white Electronically Signed   By: Donavan Foil M.D.   On: 09/18/2021 23:01   ? ?Procedures ?Procedures  ? ? ?Medications Ordered in ED ?Medications - No data to display ? ?ED Course/ Medical Decision Making/ A&P ?  ?                        ?Medical Decision Making ?Amount and/or Complexity of Data Reviewed ?Radiology: ordered. ? ?This patient complains of head injury after a fall; this involves an extensive number of treatment ?Options and is a complaint that carries with it a high risk of complications and ?morbidity. The differential includes contusion, fracture, bleed ? ?I ordered imaging studies which included CT head and I independently ?   visualized and interpreted imaging which showed no acute findings ?Additional history obtained from patient's family  member ?Previous records obtained and reviewed in epic, has been seen before for falls ?Social determinants considered, no significant barriers ?Critical Interventions: None ? ?After the interventions stated above,

## 2021-09-18 NOTE — ED Triage Notes (Signed)
Pt arrived from [facility] via [facility] w c/o falling and hitting right side of head on bedside table. Pt states that she has fallen a lot lately and doesn't know why ?

## 2021-09-18 NOTE — Discharge Instructions (Addendum)
You are seen in the emergency department for evaluation of injuries from a fall.  You had a CAT scan of your head that did not show any significant intracranial injury.  He can use ice to the affected area and Tylenol for pain.  Please follow-up with your primary care doctor.  Return to the emergency department if any worsening or concerning symptoms ?

## 2021-09-23 ENCOUNTER — Ambulatory Visit: Payer: Medicare Other | Admitting: Pulmonary Disease

## 2021-10-25 ENCOUNTER — Emergency Department (HOSPITAL_COMMUNITY)
Admission: EM | Admit: 2021-10-25 | Discharge: 2021-10-25 | Disposition: A | Discharge status: Skilled Nursing Facility | Payer: Medicare Other | Attending: Emergency Medicine | Admitting: Emergency Medicine

## 2021-10-25 ENCOUNTER — Emergency Department (HOSPITAL_COMMUNITY): Payer: Medicare Other

## 2021-10-25 ENCOUNTER — Encounter (HOSPITAL_COMMUNITY): Payer: Self-pay | Admitting: Emergency Medicine

## 2021-10-25 ENCOUNTER — Other Ambulatory Visit: Payer: Self-pay

## 2021-10-25 DIAGNOSIS — Y92129 Unspecified place in nursing home as the place of occurrence of the external cause: Secondary | ICD-10-CM | POA: Insufficient documentation

## 2021-10-25 DIAGNOSIS — W01198A Fall on same level from slipping, tripping and stumbling with subsequent striking against other object, initial encounter: Secondary | ICD-10-CM | POA: Diagnosis not present

## 2021-10-25 DIAGNOSIS — Z79899 Other long term (current) drug therapy: Secondary | ICD-10-CM | POA: Insufficient documentation

## 2021-10-25 DIAGNOSIS — Z7982 Long term (current) use of aspirin: Secondary | ICD-10-CM | POA: Diagnosis not present

## 2021-10-25 DIAGNOSIS — S63259A Unspecified dislocation of unspecified finger, initial encounter: Secondary | ICD-10-CM

## 2021-10-25 DIAGNOSIS — S6992XA Unspecified injury of left wrist, hand and finger(s), initial encounter: Secondary | ICD-10-CM | POA: Diagnosis present

## 2021-10-25 DIAGNOSIS — S63285A Dislocation of proximal interphalangeal joint of left ring finger, initial encounter: Secondary | ICD-10-CM | POA: Insufficient documentation

## 2021-10-25 MED ORDER — BUPIVACAINE HCL (PF) 0.5 % IJ SOLN
10.0000 mL | Freq: Once | INTRAMUSCULAR | Status: AC
  Administered 2021-10-25: 10 mL
  Filled 2021-10-25: qty 30

## 2021-10-25 NOTE — ED Provider Notes (Signed)
[facility] EMERGENCY DEPARTMENT Provider Note   CSN: [id number] Arrival date & time: 10/25/21  1402     History  Chief Complaint  Patient presents with   Finger Injury    Angela Galvan is a 88 y.o. female.  Pt complains of falling while using her walker.  Pt reports she hit her finger.  Pt complains of deformed finger.  Pt denies any other areas of injury.  No impact of head.  No other areas of injury   The history is provided by the patient. No language interpreter was used.      Home Medications Prior to Admission medications   Medication Sig Start Date End Date Taking? Authorizing Provider  albuterol (VENTOLIN HFA) 108 (90 Base) MCG/ACT inhaler INHALE 2 PUFFS EVERY 6 HOURS AS NEEDED FOR WHEEZING OR SHORTNESS OF BREATH Patient taking differently: Inhale 2 puffs into the lungs every 6 (six) hours as needed for wheezing or shortness of breath. 07/29/19   Margaretha Seeds, MD  aspirin EC 81 MG tablet Take 81 mg by mouth daily. Swallow whole.    [provider]  atorvastatin (LIPITOR) 10 MG tablet Take 1 tablet (10 mg total) by mouth daily after breakfast. 05/05/19   Corum, Rex Kras, MD  benzonatate (TESSALON) 200 MG capsule benzonatate 200 mg capsule  Take 1 capsule every 12 hours by oral route for 10 days.    [provider]  busPIRone (BUSPAR) 5 MG tablet Take 5 mg by mouth 2 (two) times daily. 08/06/21   [provider]  celecoxib (CELEBREX) 200 MG capsule celecoxib 200 mg capsule    [provider]  diclofenac Sodium (VOLTAREN) 1 % GEL Apply topically. 06/01/21   [provider]  ergocalciferol (VITAMIN D2) 1.25 MG (50000 UT) capsule ergocalciferol (vitamin D2) 1,250 mcg (50,000 unit) capsule  Take 1 capsule every day by oral route for 56 days.    [provider]  gabapentin (NEURONTIN) 100 MG capsule Take by mouth. 07/21/21   [provider]  Ibuprofen 200 MG CAPS Advil    [provider]  levothyroxine  (SYNTHROID) 75 MCG tablet Take 1 tablet (75 mcg total) by mouth daily. Patient not taking: Reported on 08/09/2021 07/11/19   Maryruth Hancock, MD  levothyroxine (SYNTHROID) 88 MCG tablet Take 88 mcg by mouth daily before breakfast. Patient not taking: Reported on 08/09/2021    [provider]  loperamide (IMODIUM) 1 MG/5ML solution Imodium 1 mg/5 mL oral liquid  Take 30 mL 4 times a day by oral route as needed. Patient not taking: Reported on 08/09/2021    [provider]  loperamide (IMODIUM) 2 MG capsule Take 2 mg by mouth as needed for diarrhea or loose stools.    [provider]  loratadine (CLARITIN) 10 MG tablet Take 10 mg by mouth daily.    [provider]  losartan (COZAAR) 100 MG tablet Take 1 tablet (100 mg total) by mouth daily. 05/05/19   Corum, Rex Kras, MD  RABEprazole (ACIPHEX) 20 MG tablet TAKE 1 TABLET BY MOUTH 2 TIMES DAILY Patient taking differently: Take 20 mg by mouth in the morning and at bedtime. 12/26/18   Mahala Menghini, PA-C  RESTASIS 0.05 % ophthalmic emulsion Place 1 drop into both eyes 2 (two) times daily.  08/21/19   [provider]  sertraline (ZOLOFT) 100 MG tablet Take 100 mg by mouth daily.    [provider]  TRELEGY ELLIPTA 100-62.5-25 MCG/INH AEPB Inhale 1 puff  into the lungs daily. 08/18/20   [provider]      Allergies    Patient has no known allergies.    Review of Systems   Review of Systems  Musculoskeletal:  Positive for joint swelling.  All other systems reviewed and are negative.  Physical Exam Updated Vital Signs BP (!) 141/68 (BP Location: Right Arm)   Pulse 71   Temp 97.9 F (36.6 C) (Oral)   Resp 15   Ht '5\' 3"'$  (1.6 m)   Wt 52.2 kg   SpO2 96%   BMI 20.37 kg/m  Physical Exam Vitals reviewed.  Constitutional:      Appearance: Normal appearance.  Musculoskeletal:        General: Deformity and signs of injury present.  Skin:    General: Skin is warm.  Neurological:      General: No focal deficit present.     Mental Status: She is alert.  Psychiatric:        Mood and Affect: Mood normal.    ED Results / Procedures / Treatments   Labs (all labs ordered are listed, but only abnormal results are displayed) Labs Reviewed - No data to display  EKG None  Radiology No results found.  Procedures .Ortho Injury Treatment  Date/Time: 10/25/2021 6:33 PM Performed by: Fransico Meadow, PA-C Authorized by: Fransico Meadow, PA-C   Consent:    Consent obtained:  Verbal   Consent given by:  Patient   Risks discussed:  Irreducible dislocation   Alternatives discussed:  Delayed treatmentPre-procedure distal perfusion: normal Pre-procedure neurological function: normal Pre-procedure range of motion: reduced Anesthesia: digital block  Anesthesia: Local anesthesia used: yes Local Anesthetic: bupivacaine 0.5% without epinephrine Immobilization: splint Splint type: static finger Splint Applied by: ED Provider Post-procedure neurovascular assessment: post-procedure neurovascularly intact Post-procedure distal perfusion: normal Post-procedure neurological function: normal Comments: Finger reduced with traction and flexion       Medications Ordered in ED Medications - No data to display  ED Course/ Medical Decision Making/ A&P                           Medical Decision Making Amount and/or Complexity of Data Reviewed Radiology: ordered.  Risk Prescription drug management.   Pt placed in a splint.  Pt advised to follow up with [facility] for recheck.         Final Clinical Impression(s) / ED Diagnoses Final diagnoses:  Closed dislocation of finger, initial encounter    Rx / DC Orders ED Discharge Orders     None      An After Visit Summary was printed and given to the patient.    Sidney Ace 10/25/21 Kathaleen Maser    Noemi Chapel, MD 10/26/21 [phone]

## 2021-10-25 NOTE — ED Triage Notes (Signed)
Brought in by EMS for a fall while using walker at facility, left ring finger grossly dislocated.

## 2021-10-26 ENCOUNTER — Ambulatory Visit: Payer: Medicare Other | Admitting: Adult Health

## 2021-11-01 ENCOUNTER — Ambulatory Visit: Payer: Medicare Other | Admitting: Orthopedic Surgery

## 2021-11-01 ENCOUNTER — Ambulatory Visit (INDEPENDENT_AMBULATORY_CARE_PROVIDER_SITE_OTHER): Payer: Medicare Other | Admitting: Orthopedic Surgery

## 2021-11-01 VITALS — BP 122/74 | HR 72 | Ht 63.0 in | Wt 118.0 lb

## 2021-11-01 DIAGNOSIS — S63285A Dislocation of proximal interphalangeal joint of left ring finger, initial encounter: Secondary | ICD-10-CM | POA: Diagnosis not present

## 2021-11-01 NOTE — Progress Notes (Signed)
Chief Complaint  Patient presents with   Hand Injury    Left ring finger, DOI 10-25-21.    HPI: 88 year old female injured secondary to a fall she sustained a left ring finger PIP dislocation which was closed reduced in the emergency room on May 30 she is here for a 7-day follow-up.  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   Grade III, 3.5 cm, ER-/PR- left breast s/p mastectomy with SLN (0/1)   Depression    Diabetes mellitus    no meds.-diet controlled   Diarrhea    Diverticulitis    s/p colectomy in 1991   GERD (gastroesophageal reflux disease)    HOH (hard of hearing)    HTN (hypertension)    Does not see a cardiologist   Hyperlipidemia    Hypothyroidism    IBS (irritable bowel syndrome)    S/P colonoscopy 2003   minimal internal hemorrhoids, pancolonic diverticula, biopsy of rectum: lymphoplasmacytic microscopic colitis, colon biopsies normal   S/P colonoscopy 2012   pancolonic diverticula, nl TI, external hemorrhoidal tag,/rectum bx no abnormalities   S/P endoscopy 2012   normal esophagus, stenotic pyloric channel, TTS dilation   Stroke ([place]) 08/10/2006   no residual    BP 122/74   Pulse 72   Ht '5\' 3"'$  (1.6 m)   Wt 118 lb (53.5 kg)   BMI 20.90 kg/m    General appearance: Well-developed well-nourished no gross deformities  Cardiovascular normal pulse and perfusion normal color without edema  Neurologically no sensation loss or deficits or pathologic reflexes  Psychological: Awake alert    Skin no lacerations or ulcerations no nodularity no palpable masses, no erythema or nodularity  Musculoskeletal: Left ring finger has swelling at the PIP joint mild laxity in flexion none in extension with buddy taping the fingers were able to be  flexed at the PIP joint though not completely  Imaging 2 sets of images are noted #1 there is a dislocation of the PIP joint of the left ring finger  Second set of images show a reduction with no fracture and the reduction is congruent  ER records were reviewed indicating a closed manipulation  A/P  Encounter Diagnosis  Name Primary?   Closed traumatic dislocation of proximal interphalangeal (PIP) joint of left ring finger Yes     PIP joint dislocation left ring finger  Buddy tape active range of motion OT PT follow-up in 2 weeks for reassessment.

## 2021-11-08 ENCOUNTER — Telehealth: Payer: Self-pay | Admitting: Radiology

## 2021-11-08 NOTE — Telephone Encounter (Signed)
Gave OT orders for her therapy / verbal

## 2021-11-09 ENCOUNTER — Telehealth: Payer: Self-pay | Admitting: Radiology

## 2021-11-09 NOTE — Telephone Encounter (Signed)
Needing verbal orders to see patient 2 x week x 2 weeks and 1 x week x 2 weeks.  Verbal order given.

## 2021-11-14 ENCOUNTER — Ambulatory Visit (INDEPENDENT_AMBULATORY_CARE_PROVIDER_SITE_OTHER): Payer: Medicare Other | Admitting: Orthopedic Surgery

## 2021-11-14 DIAGNOSIS — S63285A Dislocation of proximal interphalangeal joint of left ring finger, initial encounter: Secondary | ICD-10-CM

## 2021-11-14 NOTE — Progress Notes (Signed)
Chief Complaint  Patient presents with   Hand Injury    LT ring finger DOI 10/25/21   Initial HISTORY:  HPI: 88 year old female injured secondary to a fall she sustained a left ring finger PIP dislocation which was closed reduced in the emergency room on May 30 she is here for a 7-day follow-up.  She was in an extension splint she has stiffness and pain at the PIP joint   The finger is stable and the rom is a little bit decreased   But should be fine   Remove tape   Rom as tolerated and fu as needed

## 2021-11-21 ENCOUNTER — Ambulatory Visit: Payer: Medicare Other | Admitting: Pulmonary Disease

## 2021-11-22 ENCOUNTER — Telehealth: Payer: Self-pay | Admitting: Radiology

## 2021-11-22 NOTE — Telephone Encounter (Signed)
Amy called, asked for extension of HH orders 1 x W x 4 W.  Verbal orders given.

## 2021-12-29 ENCOUNTER — Emergency Department (HOSPITAL_COMMUNITY): Payer: Medicare Other

## 2021-12-29 ENCOUNTER — Other Ambulatory Visit: Payer: Self-pay

## 2021-12-29 ENCOUNTER — Emergency Department (HOSPITAL_COMMUNITY)
Admission: EM | Admit: 2021-12-29 | Discharge: 2021-12-29 | Disposition: A | Discharge status: Home / Self Care | Payer: Medicare Other | Attending: Student | Admitting: Student

## 2021-12-29 ENCOUNTER — Encounter (HOSPITAL_COMMUNITY): Payer: Self-pay | Admitting: Emergency Medicine

## 2021-12-29 DIAGNOSIS — Z7982 Long term (current) use of aspirin: Secondary | ICD-10-CM | POA: Insufficient documentation

## 2021-12-29 DIAGNOSIS — W19XXXA Unspecified fall, initial encounter: Secondary | ICD-10-CM

## 2021-12-29 DIAGNOSIS — Y92129 Unspecified place in nursing home as the place of occurrence of the external cause: Secondary | ICD-10-CM | POA: Insufficient documentation

## 2021-12-29 DIAGNOSIS — W182XXA Fall in (into) shower or empty bathtub, initial encounter: Secondary | ICD-10-CM | POA: Insufficient documentation

## 2021-12-29 DIAGNOSIS — S0990XA Unspecified injury of head, initial encounter: Secondary | ICD-10-CM | POA: Insufficient documentation

## 2021-12-29 MED ORDER — GADOBUTROL 1 MMOL/ML IV SOLN
6.0000 mL | Freq: Once | INTRAVENOUS | Status: AC | PRN
  Administered 2021-12-29: 6 mL via INTRAVENOUS

## 2021-12-29 NOTE — ED Triage Notes (Addendum)
Pt BIB [facility] from [place] for fall, per pt she was turning around to use bathroom and her legs gave out. Denies LOC, to hematoma noted to back of head.

## 2021-12-29 NOTE — Discharge Instructions (Signed)
You may take Tylenol every 4 hours if needed for discomfort.  Follow-up with your primary care provider for recheck.  Also follow-up with your neurosurgeon.  Return to the emergency department for any new or worsening symptoms

## 2021-12-29 NOTE — ED Provider Notes (Signed)
[facility] EMERGENCY DEPARTMENT Provider Note   CSN: [id number] Arrival date & time: 12/29/21  1240     History  Chief Complaint  Patient presents with   Fall    Angela Galvan is a 88 y.o. female.   Fall Pertinent negatives include no chest pain, no abdominal pain, no headaches and no shortness of breath.        Angela Galvan is a 88 y.o. female who resides at [facility] who presents to the Emergency Department complaining of fall while in the bathroom.  She states that she returned home from the neurosurgery office where she was being evaluated for back pain.  She states that she slipped and fell and struck her head on something.  She is unsure but believes it was the side of a wall.  She denies any loss of consciousness.  No dizziness or headache.  No nausea or vomiting.  No chest pain abdominal pain or shortness of breath.  She denies any pain of her upper or lower extremities pelvis or back.    Home Medications Prior to Admission medications   Medication Sig Start Date End Date Taking? Authorizing Provider  albuterol (VENTOLIN HFA) 108 (90 Base) MCG/ACT inhaler INHALE 2 PUFFS EVERY 6 HOURS AS NEEDED FOR WHEEZING OR SHORTNESS OF BREATH Patient taking differently: Inhale 2 puffs into the lungs every 6 (six) hours as needed for wheezing or shortness of breath. 07/29/19   Margaretha Seeds, MD  aspirin EC 81 MG tablet Take 81 mg by mouth daily. Swallow whole.    [provider]  atorvastatin (LIPITOR) 10 MG tablet Take 1 tablet (10 mg total) by mouth daily after breakfast. 05/05/19   Corum, Rex Kras, MD  benzonatate (TESSALON) 200 MG capsule benzonatate 200 mg capsule  Take 1 capsule every 12 hours by oral route for 10 days.    [provider]  busPIRone (BUSPAR) 5 MG tablet Take 5 mg by mouth 2 (two) times daily. 08/06/21   [provider]  celecoxib (CELEBREX) 200 MG capsule celecoxib 200 mg capsule    [provider]  diclofenac Sodium  (VOLTAREN) 1 % GEL Apply topically. 06/01/21   [provider]  ergocalciferol (VITAMIN D2) 1.25 MG (50000 UT) capsule ergocalciferol (vitamin D2) 1,250 mcg (50,000 unit) capsule  Take 1 capsule every day by oral route for 56 days.    [provider]  gabapentin (NEURONTIN) 100 MG capsule Take by mouth. 07/21/21   [provider]  Ibuprofen 200 MG CAPS Advil    [provider]  levothyroxine (SYNTHROID) 75 MCG tablet Take 1 tablet (75 mcg total) by mouth daily. 07/11/19   Corum, Rex Kras, MD  levothyroxine (SYNTHROID) 88 MCG tablet Take 88 mcg by mouth daily before breakfast.    [provider]  loperamide (IMODIUM) 1 MG/5ML solution     [provider]  loperamide (IMODIUM) 2 MG capsule Take 2 mg by mouth as needed for diarrhea or loose stools.    [provider]  loratadine (CLARITIN) 10 MG tablet Take 10 mg by mouth daily.    [provider]  losartan (COZAAR) 100 MG tablet Take 1 tablet (100 mg total) by mouth daily. 05/05/19   Corum, Rex Kras, MD  RABEprazole (ACIPHEX) 20 MG tablet TAKE 1 TABLET BY MOUTH 2 TIMES DAILY Patient taking differently: Take 20 mg by mouth in the morning and at bedtime. 12/26/18   Mahala Menghini, PA-C  RESTASIS 0.05 % ophthalmic  emulsion Place 1 drop into both eyes 2 (two) times daily.  08/21/19   [provider]  sertraline (ZOLOFT) 100 MG tablet Take 100 mg by mouth daily.    [provider]  TRELEGY ELLIPTA 100-62.5-25 MCG/INH AEPB Inhale 1 puff into the lungs daily. 08/18/20   [provider]      Allergies    Patient has no known allergies.    Review of Systems   Review of Systems  Constitutional:  Negative for appetite change, chills and fever.  Eyes:  Negative for pain and visual disturbance.  Respiratory:  Negative for shortness of breath.   Cardiovascular:  Negative for chest pain.  Gastrointestinal:  Negative for abdominal pain, nausea and vomiting.   Genitourinary:  Negative for difficulty urinating and dysuria.  Musculoskeletal:  Negative for back pain, neck pain and neck stiffness.  Skin:  Negative for wound.  Neurological:  Negative for dizziness, syncope, facial asymmetry, numbness and headaches.  Psychiatric/Behavioral:  Negative for confusion.     Physical Exam Updated Vital Signs BP (!) 167/64   Pulse 73   Temp 98.2 F (36.8 C) (Oral)   Resp 16   Ht 5' (1.524 m)   Wt 51.7 kg   SpO2 97%   BMI 22.26 kg/m  Physical Exam Vitals and nursing note reviewed.  Constitutional:      General: She is not in acute distress.    Appearance: Normal appearance. She is not toxic-appearing.  HENT:     Head:     Comments: No abrasions or hematomas of the scalp.  No bony deformities    Right Ear: Tympanic membrane and ear canal normal.     Left Ear: Tympanic membrane normal.     Mouth/Throat:     Mouth: Mucous membranes are moist.  Eyes:     Extraocular Movements: Extraocular movements intact.     Conjunctiva/sclera: Conjunctivae normal.     Pupils: Pupils are equal, round, and reactive to light.  Cardiovascular:     Rate and Rhythm: Normal rate and regular rhythm.     Pulses: Normal pulses.  Pulmonary:     Effort: Pulmonary effort is normal.  Chest:     Chest wall: No tenderness.  Abdominal:     Palpations: Abdomen is soft.     Tenderness: There is no abdominal tenderness.  Musculoskeletal:        General: Normal range of motion.     Cervical back: Normal range of motion. No tenderness.     Right lower leg: No edema.     Left lower leg: No edema.  Skin:    General: Skin is warm.     Capillary Refill: Capillary refill takes less than 2 seconds.     Findings: No bruising, erythema or rash.  Neurological:     General: No focal deficit present.     Mental Status: She is alert.     GCS: GCS eye subscore is 4. GCS verbal subscore is 5. GCS motor subscore is 6.     Sensory: No sensory deficit.     Motor: Motor function is  intact. No weakness.     Coordination: Coordination is intact.     Comments: CN II through XII intact.  Speech clear.  No facial droop, no pronator drift.  5/5 Motor strength of the bilateral upper and lower extremities.  Psychiatric:        Mood and Affect: Mood normal.     ED Results / Procedures / Treatments  Labs (all labs ordered are listed, but only abnormal results are displayed) Labs Reviewed - No data to display  EKG None  Radiology MR BRAIN W WO CONTRAST  Result Date: 12/29/2021 CLINICAL DATA:  Head trauma, intracranial venous injury suspected EXAM: MRI HEAD WITHOUT AND WITH CONTRAST TECHNIQUE: Multiplanar, multiecho pulse sequences of the brain and surrounding structures were obtained without and with intravenous contrast. CONTRAST:  82m GADAVIST GADOBUTROL 1 MMOL/ML IV SOLN COMPARISON:  07/07/2021 FINDINGS: Brain: No restricted diffusion to suggest acute or subacute infarct. No mass, mass effect, or midline shift. No evidence of acute hemorrhage. No abnormal parenchymal or meningeal enhancement. No hydrocephalus or extra-axial collection. Confluent T2 hyperintense signal in the periventricular white matter and pons, likely the sequela of severe chronic small vessel ischemic disease. Advanced cerebral volume loss for age, with ex vacuo dilatation of the ventricles. Focus of hemosiderin deposition in the right pons, which was present on the 07/07/2021 exam although it has increased in size slightly, correlates with the hyperdense focus seen on the same-day CT. This is not associated with contrast enhancement or developmental venous anomaly to suggest a cavernous malformation or capillary telangiectasia. Given additional hemosiderin deposition in the left occipital and temporal lobes and bilateral cerebellar hemispheres, these may represent sequela of hypertensive microhemorrhages. Vascular: Normal flow voids. Normal arterial and venous enhancement. No evidence of vascular malformation.  Skull and upper cervical spine: Normal marrow signal. Sinuses/Orbits: No acute finding. Status post bilateral lens replacements. Other: The mastoids are well aerated. IMPRESSION: 1. 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 Other etiologies, such as a cavernoma or amyloid angiopathy are felt to be less likely. 2. No evidence of acute or subacute infarct. Electronically Signed   By: AMerilyn BabaM.D.   On: 12/29/2021 18:39   MR LUMBAR SPINE WO CONTRAST  Result Date: 12/29/2021 CLINICAL DATA:  Low back pain EXAM: MRI LUMBAR SPINE WITHOUT CONTRAST TECHNIQUE: Multiplanar, multisequence MR imaging of the lumbar spine was performed. No intravenous contrast was administered. COMPARISON:  LUMBAR SPINE RADIOGRAPHS 12/01/2019 FINDINGS: Segmentation:  5 lumbar vertebra Alignment:  Normal alignment Vertebrae: Mild compression fracture superior endplate of L4 is chronic and unchanged. No acute fracture or mass. Conus medullaris and cauda equina: Conus extends to the L1-2 level. Conus and cauda equina appear normal. Paraspinal and other soft tissues: Negative for paraspinous mass, adenopathy, or fluid collection Disc levels: L1-2: Mild disc bulging.  Negative for stenosis L2-3: Mild disc and facet degeneration.  Negative for stenosis L3-4: Disc degeneration with disc bulging. Left foraminal small disc protrusion with mild impingement of the left L3 nerve root. Mild facet degeneration. Central canal patent. L4-5: Small left foraminal disc protrusion with mild displacement left L4 nerve root. Bilateral facet hypertrophy. Moderate subarticular stenosis bilaterally left greater than right. Central canal  borderline stenosis L5-S1: Right foraminal disc protrusion with right L5 nerve root impingement. Bilateral facet degeneration. IMPRESSION: Mild compression fracture of L4.  No acute fracture Left-sided disc protrusions L3-4 and  L4-5. Small right foraminal disc protrusion L5-S1. Electronically Signed   By: CFranchot GalloM.D.   On: 12/29/2021 18:25   CT Head Wo Contrast  Result Date: 12/29/2021 CLINICAL DATA:  Fall from standing.  Hematoma to back of  head. EXAM: CT HEAD WITHOUT CONTRAST CT CERVICAL SPINE WITHOUT CONTRAST TECHNIQUE: Multidetector CT imaging of the head and cervical spine was performed following the standard protocol without intravenous contrast. Multiplanar CT image reconstructions of the cervical spine were also generated. RADIATION DOSE REDUCTION: This exam was performed according to the departmental dose-optimization program which includes automated exposure control, adjustment of the mA and/or kV according to patient size and/or use of iterative reconstruction technique. COMPARISON:  Head CT 09/18/2021.  Cervical spine CT 05/03/2021 FINDINGS: CT HEAD FINDINGS Brain: There is a tiny hyperattenuating focus in the right aspect of the pons (axial 11/3 and sagittal 28/6). Not definitely seen on previous head CT from 09/18/2021, there was some susceptibility artifact in this region on the MRI of 07/07/2021. As such, this may be a vascular malformation or cavernoma with associated acute blood products. There is no evidence for hydrocephalus, mass lesion, or abnormal extra-axial fluid collection. No definite CT evidence for acute infarction. Diffuse loss of parenchymal volume is consistent with atrophy. Patchy low attenuation in the deep hemispheric and periventricular white matter is nonspecific, but likely reflects chronic microvascular ischemic demyelination. Vascular: No hyperdense vessel or unexpected calcification. Skull: No evidence for fracture. No worrisome lytic or sclerotic lesion.  Sinuses/Orbits: The visualized paranasal sinuses and mastoid air cells are clear. Visualized portions of the globes and intraorbital fat are unremarkable. Other: None. CT CERVICAL SPINE FINDINGS Alignment: Straightening of normal cervical lordosis evident with associated trace degenerative anterolisthesis of C4 on 5. Skull base and vertebrae: No acute fracture. No primary bone lesion or focal pathologic process. Soft tissues and spinal canal: No prevertebral fluid or swelling. No visible canal hematoma. Disc levels:  Disc spaces are relatively well preserved throughout. Upper chest: Biapical pleuroparenchymal scarring noted in the lung apices. Other: None. IMPRESSION: 1. Tiny hyperattenuating focus in the right aspect of the pons. This may be a vascular malformation or cavernoma with associated acute blood products. MRI without and with contrast recommended to further evaluate. 2. No cervical spine fracture or subluxation. 3. Atrophy with chronic small vessel ischemic disease. Critical Value/emergent results were called by telephone at the time of interpretation on 12/29/2021 at 3:19 pm to provider Dr. Linus Salmons, who verbally acknowledged these results. Electronically Signed   By: Misty Stanley M.D.   On: 12/29/2021 15:20   CT Cervical Spine Wo Contrast  Result Date: 12/29/2021 CLINICAL DATA:  Fall from standing.  Hematoma to back of head. EXAM: CT HEAD WITHOUT CONTRAST CT CERVICAL SPINE WITHOUT CONTRAST TECHNIQUE: Multidetector CT imaging of the head and cervical spine was performed following the standard protocol without intravenous contrast. Multiplanar CT image reconstructions of the cervical spine were also generated. RADIATION DOSE REDUCTION: This exam was performed according to the departmental dose-optimization program which includes automated exposure control, adjustment of the mA and/or kV according to patient size and/or use of iterative reconstruction technique. COMPARISON:  Head CT 09/18/2021.  Cervical  spine CT 05/03/2021 FINDINGS: CT HEAD FINDINGS Brain: There is a tiny hyperattenuating focus in the right aspect of the pons (axial 11/3 and sagittal 28/6). Not definitely seen on previous head CT from 09/18/2021, there was some susceptibility artifact in this region on the MRI of 07/07/2021. As such, this may be a vascular malformation or cavernoma with associated acute blood products. There is no evidence for hydrocephalus, mass lesion, or abnormal extra-axial fluid collection. No definite CT evidence for acute infarction. Diffuse loss of parenchymal volume is consistent with atrophy. Patchy low attenuation in the deep hemispheric  and periventricular white matter is nonspecific, but likely reflects chronic microvascular ischemic demyelination. Vascular: No hyperdense vessel or unexpected calcification. Skull: No evidence for fracture. No worrisome lytic or sclerotic lesion. Sinuses/Orbits: The visualized paranasal sinuses and mastoid air cells are clear. Visualized portions of the globes and intraorbital fat are unremarkable. Other: None. CT CERVICAL SPINE FINDINGS Alignment: Straightening of normal cervical lordosis evident with associated trace degenerative anterolisthesis of C4 on 5. Skull base and vertebrae: No acute fracture. No primary bone lesion or focal pathologic process. Soft tissues and spinal canal: No prevertebral fluid or swelling. No visible canal hematoma. Disc levels:  Disc spaces are relatively well preserved throughout. Upper chest: Biapical pleuroparenchymal scarring noted in the lung apices. Other: None. IMPRESSION: 1. Tiny hyperattenuating focus in the right aspect of the pons. This may be a vascular malformation or cavernoma with associated acute blood products. MRI without and with contrast recommended to further evaluate. 2. No cervical spine fracture or subluxation. 3. Atrophy with chronic small vessel ischemic disease. Critical Value/emergent results were called by telephone at the time  of interpretation on 12/29/2021 at 3:19 pm to provider Dr. Linus Salmons, who verbally acknowledged these results. Electronically Signed   By: Misty Stanley M.D.   On: 12/29/2021 15:20   DG Pelvis 1-2 Views  Result Date: 12/29/2021 CLINICAL DATA:  Fall. EXAM: PELVIS - 1-2 VIEW COMPARISON:  Left hip radiographs 02/28/2016; pelvis and left hip radiographs 12/01/2019 FINDINGS: There are numerous metallic surgical sutures overlying the mid to upper pelvis, similar to prior. There is diffuse decreased bone mineralization. No significant change in heterogeneous partially lucent and peripherally sclerotic proximal left femoral neck through the proximal diaphysis lesions appear not significantly changed compared to 12/01/2019 and 02/28/2016. 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 No cortical defect is seen. Mild bilateral superolateral acetabular degenerative osteophytes. The bilateral sacroiliac joint spaces are maintained. Mild pubic symphysis joint space narrowing. IMPRESSION: 1. No acute fracture is seen. 2. 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 Electronically Signed   By: Yvonne Kendall M.D.   On: 12/29/2021 14:57   DG Chest 2 View  Result Date: 12/29/2021 CLINICAL DATA:  Fall. EXAM: CHEST - 2 VIEW COMPARISON:  Chest two views 08/09/2021 FINDINGS: Cardiac silhouette is at the upper limits of normal size, unchanged. Mediastinal contours are within normal limits. The lungs are clear. No pleural effusion or pneumothorax. Mild multilevel degenerative disc changes of the thoracic spine. Unchanged elongated sclerotic foci within the proximal left humeral diaphysis. Additional less dense but similarly serpiginous densities within the more distal aspect of the bilateral visualized humeral diaphyses. Overall these findings may represent chronic bone infarcts. Right upper quadrant cholecystectomy clips. IMPRESSION: No active cardiopulmonary disease.  Electronically Signed   By: Yvonne Kendall M.D.   On: 12/29/2021 14:53    Procedures Procedures    Medications Ordered in ED Medications - No data to display  ED Course/ Medical Decision Making/ A&P Clinical Course as of 12/29/21 1538  Thu Dec 29, 2021  1520 Small pons bleed [MK]    Clinical Course User Index [MK] Kommor, Debe Coder, MD                           Medical Decision Making Patient here from local SNF for evaluation of mechanical fall that occurred shortly before ER arrival.  She was seen previously at the neurosurgeons office and evaluated for back pain.  She has pending MRI of the L-spine.  Here for evaluation of head injury without LOC after fall.  She denies any associated symptoms at this time.    Differential would include concussion, subdural hematoma, intracranial bleeding.  Skull fracture.  I suspect this may be related to minor head injury with mild concussion as patient is absent of significant symptoms.  She has a reassuring neurological work-up.  Amount and/or Complexity of Data Reviewed Radiology: ordered.    Details: ct head shows hyperattenuating focus in the right aspect of the pons possibly related to vascular malformation, cavernoma, no evidence of acute infarction.  MRI recommended  MRI brain shows recurrent microhemorrhage increased from 2/23.  no acute or subacute infarct.    MRI l spine ordered at prefernece of neurosurgery.  no evidence of acute process.  mild compression L4 Discussion of management or test interpretation with external provider(s): Discussed CT findings with neurosurgery, Dr. Glenford Peers who recommends to proceed with MRI as recommended by rads, if no worsening sx's pt may be discharged home and to close with them in clinic  I consulted radiologist for further clarification of MR results.  It was felt that micro hemorrhage seen were NOT result of her fall   Risk Prescription drug management.   Pt NV intact.  Appears approp for d/c  home.  Given head injury instructions and return precautions        Final Clinical Impression(s) / ED Diagnoses Final diagnoses:  Fall, initial encounter  Minor head injury, initial encounter    Rx / DC Orders ED Discharge Orders     None         Kem Parkinson, PA-C 12/31/21 1508    Teressa Lower, MD 01/02/22 [phone]

## 2021-12-29 NOTE — ED Notes (Signed)
Patient states she fell once and striking her head in two different areas.

## 2022-01-02 ENCOUNTER — Telehealth: Payer: Self-pay | Admitting: Radiology

## 2022-01-02 NOTE — Telephone Encounter (Signed)
Recent fall/ again Angela Galvan transferred call for verbal orders PT extended, they will send orders for signature.

## 2022-01-05 ENCOUNTER — Ambulatory Visit: Payer: Medicare Other | Admitting: Adult Health

## 2022-01-23 ENCOUNTER — Emergency Department (HOSPITAL_COMMUNITY)
Admission: EM | Admit: 2022-01-23 | Discharge: 2022-01-23 | Disposition: A | Discharge status: Skilled Nursing Facility | Payer: Medicare Other | Attending: Emergency Medicine | Admitting: Emergency Medicine

## 2022-01-23 ENCOUNTER — Other Ambulatory Visit: Payer: Self-pay

## 2022-01-23 ENCOUNTER — Encounter (HOSPITAL_COMMUNITY): Payer: Self-pay

## 2022-01-23 DIAGNOSIS — E871 Hypo-osmolality and hyponatremia: Secondary | ICD-10-CM | POA: Diagnosis not present

## 2022-01-23 DIAGNOSIS — R531 Weakness: Secondary | ICD-10-CM | POA: Diagnosis present

## 2022-01-23 DIAGNOSIS — Z7982 Long term (current) use of aspirin: Secondary | ICD-10-CM | POA: Insufficient documentation

## 2022-01-23 DIAGNOSIS — R55 Syncope and collapse: Secondary | ICD-10-CM | POA: Insufficient documentation

## 2022-01-23 LAB — CBC WITH DIFFERENTIAL/PLATELET
Abs Immature Granulocytes: 0.22 10*3/uL — ABNORMAL HIGH (ref 0.00–0.07)
Basophils Absolute: 0.1 10*3/uL (ref 0.0–0.1)
Basophils Relative: 1 %
Eosinophils Absolute: 0.3 10*3/uL (ref 0.0–0.5)
Eosinophils Relative: 3 %
HCT: 35.7 % — ABNORMAL LOW (ref 36.0–46.0)
Hemoglobin: 12.1 g/dL (ref 12.0–15.0)
Immature Granulocytes: 2 %
Lymphocytes Relative: 17 %
Lymphs Abs: 1.6 10*3/uL (ref 0.7–4.0)
MCH: 30.3 pg (ref 26.0–34.0)
MCHC: 33.9 g/dL (ref 30.0–36.0)
MCV: 89.3 fL (ref 80.0–100.0)
Monocytes Absolute: 1 10*3/uL (ref 0.1–1.0)
Monocytes Relative: 11 %
Neutro Abs: 6.2 10*3/uL (ref 1.7–7.7)
Neutrophils Relative %: 66 %
Platelets: 297 10*3/uL (ref 150–400)
RBC: 4 MIL/uL (ref 3.87–5.11)
RDW: 12.7 % (ref 11.5–15.5)
WBC: 9.3 10*3/uL (ref 4.0–10.5)
nRBC: 0 % (ref 0.0–0.2)

## 2022-01-23 LAB — BASIC METABOLIC PANEL
Anion gap: 9 (ref 5–15)
BUN: 20 mg/dL (ref 8–23)
CO2: 24 mmol/L (ref 22–32)
Calcium: 9.2 mg/dL (ref 8.9–10.3)
Chloride: 99 mmol/L (ref 98–111)
Creatinine, Ser: 1.09 mg/dL — ABNORMAL HIGH (ref 0.44–1.00)
GFR, Estimated: 49 mL/min — ABNORMAL LOW (ref >60)
Glucose, Bld: 127 mg/dL — ABNORMAL HIGH (ref 70–99)
Potassium: 4.1 mmol/L (ref 3.5–5.1)
Sodium: 132 mmol/L — ABNORMAL LOW (ref 135–145)

## 2022-01-23 LAB — MAGNESIUM: Magnesium: 2.1 mg/dL (ref 1.7–2.4)

## 2022-01-23 NOTE — ED Triage Notes (Signed)
Patient BIB by [facility] from [facility] with reports of near syncope after BM. Staff did witness event and state that patient's legs gave out, eyes rolled to back of head and they lowered her to floor. VS stable throughout event. Staff report that she was clammy after event with seizure like activity not witnessed by EMS. Denies pain, did not hit head. Alert on arrival.

## 2022-01-23 NOTE — Discharge Instructions (Signed)
The testing today did not show any specific abnormalities.  The EKG and the blood work were reassuring, the blood pressure is a little bit high, please make sure that Angela Galvan takes her medications when she gets back to the nursing facility.  Especially her Losartan  Thank you for allowing [place] to treat you in the emergency department today.  After reviewing your examination and potential testing that was done it appears that you are safe to go home.  I would like for you to follow-up with your doctor within the next several days, have them obtain your results and follow-up with them to review all of these tests.  If you should develop severe or worsening symptoms return to the emergency department immediately

## 2022-01-23 NOTE — ED Notes (Signed)
EDP in room at time of triage for MSE.  ?

## 2022-01-23 NOTE — ED Provider Notes (Signed)
[facility] EMERGENCY DEPARTMENT Provider Note   CSN: [id number] Arrival date & time: 01/23/22  2119     History  Chief Complaint  Patient presents with   Near Syncope    Angela Galvan is a 88 y.o. female.   Near Syncope Pertinent negatives include no chest pain, no abdominal pain, no headaches and no shortness of breath.   This patient is an 88 year old female, she has a history of several different problems which have recently arisen including an area of microhemorrhage in her brain which is somewhat chronic it was evaluated with an MRI within the last month, there is no significant changes, she was referred to the neurosurgical offices because of lumbar problems, in fact an MRI of the lumbar spine showed that there was a mild compression fracture of L4 but nothing acute and no signs of cauda equina.  The patient reports that her legs occasionally go out.  This in fact occurred earlier this month and she was seen in the ER for a visit, she followed up with the neurosurgical offices and comes back in today.  She was on the way to try to use the bathroom around a bowel movement, she felt like her legs were giving out, she was lowered to the ground with a staff member at [facility] currently resides.  She denies any back pain at this time and has not had any fevers or chills or difficulty breathing.  No urinary symptoms.  She states this happens occasionally and this is verified in the medical record which I have evaluated.  Paramedics noted normal blood pressure, normal heart rate, normal blood sugar, the patient does not have any complaints at this time.    Home Medications Prior to Admission medications   Medication Sig Start Date End Date Taking? Authorizing Provider  albuterol (VENTOLIN HFA) 108 (90 Base) MCG/ACT inhaler INHALE 2 PUFFS EVERY 6 HOURS AS NEEDED FOR WHEEZING OR SHORTNESS OF BREATH Patient taking differently: Inhale 2 puffs into the lungs every 6  (six) hours as needed for wheezing or shortness of breath. 07/29/19   Margaretha Seeds, MD  aspirin EC 81 MG tablet Take 81 mg by mouth daily. Swallow whole.    [provider]  atorvastatin (LIPITOR) 10 MG tablet Take 1 tablet (10 mg total) by mouth daily after breakfast. 05/05/19   Corum, Rex Kras, MD  benzonatate (TESSALON) 200 MG capsule benzonatate 200 mg capsule  Take 1 capsule every 12 hours by oral route for 10 days.    [provider]  busPIRone (BUSPAR) 5 MG tablet Take 5 mg by mouth 2 (two) times daily. 08/06/21   [provider]  celecoxib (CELEBREX) 200 MG capsule celecoxib 200 mg capsule    [provider]  diclofenac Sodium (VOLTAREN) 1 % GEL Apply topically. 06/01/21   [provider]  ergocalciferol (VITAMIN D2) 1.25 MG (50000 UT) capsule ergocalciferol (vitamin D2) 1,250 mcg (50,000 unit) capsule  Take 1 capsule every day by oral route for 56 days.    [provider]  gabapentin (NEURONTIN) 100 MG capsule Take by mouth. 07/21/21   [provider]  Ibuprofen 200 MG CAPS Advil    [provider]  levothyroxine (SYNTHROID) 75 MCG tablet Take 1 tablet (75 mcg total) by mouth daily. 07/11/19   Maryruth Hancock, MD  levothyroxine (SYNTHROID) 88 MCG tablet Take 88 mcg by mouth daily before breakfast.    [provider]  loperamide (IMODIUM) 1  MG/5ML solution     [provider]  loperamide (IMODIUM) 2 MG capsule Take 2 mg by mouth as needed for diarrhea or loose stools.    [provider]  loratadine (CLARITIN) 10 MG tablet Take 10 mg by mouth daily.    [provider]  losartan (COZAAR) 100 MG tablet Take 1 tablet (100 mg total) by mouth daily. 05/05/19   Corum, Rex Kras, MD  RABEprazole (ACIPHEX) 20 MG tablet TAKE 1 TABLET BY MOUTH 2 TIMES DAILY Patient taking differently: Take 20 mg by mouth in the morning and at bedtime. 12/26/18   Mahala Menghini, PA-C  RESTASIS 0.05 % ophthalmic emulsion  Place 1 drop into both eyes 2 (two) times daily.  08/21/19   [provider]  sertraline (ZOLOFT) 100 MG tablet Take 100 mg by mouth daily.    [provider]  TRELEGY ELLIPTA 100-62.5-25 MCG/INH AEPB Inhale 1 puff into the lungs daily. 08/18/20   [provider]      Allergies    Patient has no known allergies.    Review of Systems   Review of Systems  Constitutional:  Negative for chills and fever.  HENT:  Negative for sore throat.   Eyes:  Negative for visual disturbance.  Respiratory:  Negative for cough and shortness of breath.   Cardiovascular:  Positive for near-syncope. Negative for chest pain.  Gastrointestinal:  Negative for abdominal pain, diarrhea, nausea and vomiting.  Genitourinary:  Negative for dysuria and frequency.  Musculoskeletal:  Negative for back pain and neck pain.  Skin:  Negative for rash.  Neurological:  Positive for weakness. Negative for numbness and headaches.  Hematological:  Negative for adenopathy.  Psychiatric/Behavioral:  Negative for behavioral problems.   All other systems reviewed and are negative.   Physical Exam Updated Vital Signs BP (!) 168/64   Pulse 68   Temp (!) 97.2 F (36.2 C) (Axillary)   Resp (!) 28   SpO2 94%  Physical Exam Vitals and nursing note reviewed.  Constitutional:      General: She is not in acute distress.    Appearance: She is well-developed.  HENT:     Head: Normocephalic and atraumatic.     Mouth/Throat:     Pharynx: No oropharyngeal exudate.  Eyes:     General: No scleral icterus.       Right eye: No discharge.        Left eye: No discharge.     Conjunctiva/sclera: Conjunctivae normal.     Pupils: Pupils are equal, round, and reactive to light.  Neck:     Thyroid: No thyromegaly.     Vascular: No JVD.  Cardiovascular:     Rate and Rhythm: Normal rate and regular rhythm.     Heart sounds: Normal heart sounds. No murmur heard.    No friction rub. No gallop.  Pulmonary:      Effort: Pulmonary effort is normal. No respiratory distress.     Breath sounds: Normal breath sounds. No wheezing or rales.  Abdominal:     General: Bowel sounds are normal. There is no distension.     Palpations: Abdomen is soft. There is no mass.     Tenderness: There is no abdominal tenderness.  Musculoskeletal:        General: No tenderness. Normal range of motion.     Cervical back: Normal range of motion and neck supple.  Lymphadenopathy:     Cervical: No cervical adenopathy.  Skin:  General: Skin is warm and dry.     Findings: No erythema or rash.  Neurological:     Mental Status: She is alert.     Coordination: Coordination normal.     Comments: The patient is able to move all 4 extremities in fact she can straight leg raise to approximately 60 degrees on both legs with normal strength and sensation bilaterally.  Both of her arms have totally normal strength her level of alertness is totally normal and cranial nerves III through XII are totally normal as well.  The patient has good speech, good memory,  Psychiatric:        Behavior: Behavior normal.     ED Results / Procedures / Treatments   Labs (all labs ordered are listed, but only abnormal results are displayed) Labs Reviewed  BASIC METABOLIC PANEL - Abnormal; Notable for the following components:      Result Value   Sodium 132 (*)    Glucose, Bld 127 (*)    Creatinine, Ser 1.09 (*)    GFR, Estimated 49 (*)    All other components within normal limits  CBC WITH DIFFERENTIAL/PLATELET - Abnormal; Notable for the following components:   HCT 35.7 (*)    Abs Immature Granulocytes 0.22 (*)    All other components within normal limits  MAGNESIUM  CBC WITH DIFFERENTIAL/PLATELET    EKG EKG Interpretation  Date/Time:  Monday January 23 2022 08:38:36 EDT Ventricular Rate:  66 PR Interval:  155 QRS Duration: 92 QT Interval:  439 QTC Calculation: 460 R Axis:   21 Text Interpretation: Sinus rhythm Minimal ST  depression, anterolateral leads since last tracing no significant change Confirmed by Noemi Chapel [phone]) on 01/23/2022 8:50:39 AM  Radiology No results found.  Procedures Procedures    Medications Ordered in ED Medications - No data to display  ED Course/ Medical Decision Making/ A&P                           Medical Decision Making Amount and/or Complexity of Data Reviewed Labs: ordered.   This patient presents to the ED for concern of general weakness in the legs differential diagnosis includes electrolyte abnormalities, could be related to lumbar fracture in the back, she does not have any focal neurologic deficits to suggest that there is something worsening going on and she does have a pattern of this happening in the past.  There is no syncopal episode and she is not having any chest pain or focal neurologic deficits    Additional history obtained:  Additional history obtained from electronic medical record External records from outside source obtained and reviewed including prior MRI and neurosurgical records   Lab Tests:  I Ordered, and personally interpreted labs.  The pertinent results include: Metabolic panel and CBC CBC shows no anemia, no leukocytosis, metabolic panel with creatinine of 1.09 which is better than it has been in the past, she has chronic hyponatremia which is unchanged with a sodium of 132.  Her magnesium was 2.1.    Medicines ordered and prescription drug management:  The patient had an unremarkable exam and no symptoms, she was not given any medications.  It was noted that she was hypertensive and had not had her morning medicines, recommended that nursing facility give her her morning medications as soon as she returns I have reviewed the patients home medicines and have made adjustments as needed   Problem List / ED Course:  Weakness  of the legs, transient, seems to occur from time to time, does not appear to be an acute component of an  acute neurologic condition or Cardiologic condition either.  She is asymptomatic   Social Determinants of Health:  Nursing facility  Considered admission but the patient is very stable for discharge at this time, I do not think an inpatient admission would help to further delineate the patient's transient symptoms, given that it is bilateral legs it is very unlikely that this is related to a cerebral event           Final Clinical Impression(s) / ED Diagnoses Final diagnoses:  Weakness    Rx / DC Orders ED Discharge Orders     None         Noemi Chapel, MD 01/23/22 1121

## 2022-01-26 ENCOUNTER — Telehealth: Payer: Self-pay | Admitting: Orthopedic Surgery

## 2022-01-26 NOTE — Telephone Encounter (Signed)
Voice message received from Amy at [facility], ph [phone], requesting verbal orders to extend therapy visits due to "a couple more falls" for:  2 times per week for 1 week, and 1 time per week for 3 weeks. Please advise.

## 2022-01-27 NOTE — Telephone Encounter (Signed)
Left message for Randol Zumstein to give verbal orders to extend ok per Dr Aline Brochure

## 2022-01-31 ENCOUNTER — Ambulatory Visit: Payer: Medicare Other | Admitting: Pulmonary Disease

## 2022-02-01 NOTE — Progress Notes (Signed)
[facility] [address] [place]. [place] [address] [phone]       OFFICE FOLLOW UP NOTE  [name] Date of Birth:  09-22-1932 Medical Record Number:  [id number]   Referring MD: Angela Asper, FNP  Primary neurologist: Dr. Leonie Galvan Reason for Referral: Frequent falls  Chief Complaint  Patient presents with   Falls    Rm 2 daughter- Angela Galvan - [place] care coordinator "multiple falls, need to find out why"      HPI:   Update 02/01/2022 JM: returns for f/u visit after prior visit 8 months ago with Dr. Leonie Galvan.  She is accompanied by her daughter and Risk manager.  Repeated MRI brain, CTA head/neck and EEG shortly after prior visit which were all unremarkable. She reports continued frequent falls, evaluated in ED multiple times for recurrent falls since prior visit.  Is currently working with PT at [place].  Has been having some issues with vertigo, questioning vestibular therapy.  Repeat MRI brain 12/2021 showed hyperdense focus of right pons favored to represent recurrent hypertensive microhemorrhage given additional foci of hemosiderin in the cerebellum, left temporal and left occipital lobes, did not feel this was a new bleed.  Lumbar imaging 8/3 showed mild L4 compression deformity but otherwise unremarkable.  Being followed by Dr. Annette Galvan for lumbar stenosis.  She is understandably frustrated that she still has no answers in regards to recurrent falls, she has not previously been seen by academic center.      History provided for reference purposes only Consult visit 06/20/2021 Dr. Leonie Galvan: [name] is a 88 year old pleasant Caucasian lady seen today for office consultation visit for frequent falls and memory loss.  She is accompanied by her daughter as well as caregiver and history is obtained from them and review of electronic medical records and opossum reviewed available pertinent imaging films in PACS.  She has extensive past medical  history as listed below.  She has had longstanding history of falling abruptly without warning.  Initial episodes were infrequent and in December 2017 and followed by Angela Galvan 2018.  Episodes occur mostly when she is trying to get up or sit down.  The first episode occurred when she was getting out of her car and fell down abruptly.  She remembered some passersby helping her back to her car and she had a lot of dirt in the hand.  She had fallen down a pothole but did not remember how that happened.  Second episode occurred in Angela Galvan 2019 when she was planning to walk and remembers sitting up in a chair in a restaurant and EMS talking to her.  She had extensive neurovascular evaluation including CT scan and MRI findings EEG and cardiac monitoring all of it was negative. She  was seen by me in 2018 for these episodes and by cardiologist Dr. Jenkins Galvan and cardiac causes of syncope as well as neurovascular causes and EEG all benign.  She was then lost to follow-up.  She is at increased frequency of falls recently in the last 1 year she has had 31 falls.  She is presently living at assisted living facility.  She has been using a walker consistently.  She states she did not have any warning and is unable to hold off and most of falls have been either falling backwards or forwards.  She does not lose consciousness and most of these episodes.  She has been to the ER multiple times and had CT scan of the C-spine and  brain done which have been benign the last 1 being on 05/03/2021.  She denies any seizure-like activity, tongue bite injury or muscle soreness after these episodes.  She denies any other focal neurological symptoms like double vision, vertigo, nausea or focal weakness or numbness completing these episodes either.  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   Grade III, 3.5 cm, ER-/PR- left breast s/p mastectomy with SLN (0/1)   Depression    Diabetes mellitus    no meds.-diet controlled   Diarrhea    Diverticulitis    s/p colectomy in 1991   GERD (gastroesophageal reflux disease)    HOH (hard of hearing)    HTN (hypertension)    Does not see a cardiologist   Hyperlipidemia    Hypothyroidism    IBS (irritable bowel syndrome)    S/P colonoscopy 2003   minimal internal hemorrhoids, pancolonic diverticula, biopsy of rectum: lymphoplasmacytic microscopic colitis, colon biopsies normal   S/P colonoscopy 2012   pancolonic diverticula, nl TI, external hemorrhoidal tag,/rectum bx no abnormalities   S/P endoscopy 2012   normal esophagus, stenotic pyloric channel, TTS dilation   Stroke ([place]) 08/10/2006   no residual    Social History:  Social History   Socioeconomic History   Marital status: Widowed    Spouse name: Not on file   Number of children: Not on file   Years of education: Not on file   Highest education level: Not on file  Occupational History   Occupation: retired    Fish farm manager: RETIRED  Tobacco Use   Smoking status: Never   Smokeless tobacco: Never   Tobacco comments:    Never smoked  Vaping Use   Vaping Use: Never used  Substance and Sexual Activity   Alcohol use: Yes    Alcohol/week: 1.0 standard drink of alcohol    Types: 1 Glasses of wine per week    Comment: occassional wine   Drug use: No   Sexual activity: Never  Other Topics Concern   Not on file  Social History Narrative   02/02/22 resides at [place] Strain: Not on file  Food Insecurity: Not on file   Transportation Needs: Not on file  Physical Activity: Not on file  Stress: Not on file  Social Connections: Not on file  Intimate Partner Violence: Not on file    Medications:   Current Outpatient Medications on File Prior to Visit  Medication Sig Dispense Refill   albuterol (VENTOLIN HFA) 108 (90 Base) MCG/ACT inhaler INHALE 2 PUFFS EVERY 6 HOURS AS NEEDED FOR WHEEZING OR SHORTNESS OF BREATH (Patient taking differently: Inhale 2 puffs into the lungs every 6 (six) hours as needed for wheezing or shortness of breath.) 8.5 g 3   aspirin EC 81 MG tablet Take 81 mg by mouth daily. Swallow whole.     atorvastatin (LIPITOR) 10 MG tablet Take  1 tablet (10 mg total) by mouth daily after breakfast. 90 tablet 0   benzonatate (TESSALON) 200 MG capsule benzonatate 200 mg capsule  Take 1 capsule every 12 hours by oral route for 10 days.     busPIRone (BUSPAR) 5 MG tablet Take 5 mg by mouth 2 (two) times daily.     celecoxib (CELEBREX) 200 MG capsule celecoxib 200 mg capsule     diclofenac Sodium (VOLTAREN) 1 % GEL Apply topically.     ergocalciferol (VITAMIN D2) 1.25 MG (50000 UT) capsule ergocalciferol (vitamin D2) 1,250 mcg (50,000 unit) capsule  Take 1 capsule every day by oral route for 56 days.     gabapentin (NEURONTIN) 100 MG capsule Take by mouth.     Ibuprofen 200 MG CAPS Advil     levothyroxine (SYNTHROID) 75 MCG tablet Take 1 tablet (75 mcg total) by mouth daily. 90 tablet 3   levothyroxine (SYNTHROID) 88 MCG tablet Take 88 mcg by mouth daily before breakfast.     loperamide (IMODIUM) 1 MG/5ML solution      loperamide (IMODIUM) 2 MG capsule Take 2 mg by mouth as needed for diarrhea or loose stools.     loratadine (CLARITIN) 10 MG tablet Take 10 mg by mouth daily.     losartan (COZAAR) 100 MG tablet Take 1 tablet (100 mg total) by mouth daily. 90 tablet 0   RABEprazole (ACIPHEX) 20 MG tablet TAKE 1 TABLET BY MOUTH 2 TIMES DAILY (Patient taking differently: Take 20 mg by mouth in the  morning and at bedtime.) 180 tablet 2   RESTASIS 0.05 % ophthalmic emulsion Place 1 drop into both eyes 2 (two) times daily.      sertraline (ZOLOFT) 100 MG tablet Take 100 mg by mouth daily.     TRELEGY ELLIPTA 100-62.5-25 MCG/INH AEPB Inhale 1 puff into the lungs daily.     No current facility-administered medications on file prior to visit.    Allergies:  No Known Allergies  Physical Exam Today's Vitals   02/02/22 0823  BP: (!) 147/62  Pulse: 73  Weight: 112 lb 9.6 oz (51.1 kg)  Height: 5' (1.524 m)   Body mass index is 21.99 kg/m.   General: Pleasant frail elderly Caucasian lady, seated, in no evident distress Head: head normocephalic and atraumatic.   Neck: supple with no carotid or supraclavicular bruits Cardiovascular: regular rate and rhythm, no murmurs Musculoskeletal: no deformity Skin:  no rash/petichiae Vascular:  Normal pulses all extremities  Neurologic Exam Mental Status: Awake and fully alert. Oriented to place and time. Recent and remote memory intact. Attention span, concentration and fund of knowledge appropriate. Mood and affect appropriate.  Glabellar tap is negative.  Palmomental reflex absent. Cranial Nerves: Pupils equal, briskly reactive to light. Extraocular movements full without nystagmus. Visual fields full to confrontation. Hearing intact. Facial sensation intact. Face, tongue, palate moves normally and symmetrically.  Motor: Normal bulk and tone. Normal strength in all tested extremity muscles.  No resting tremor or cogwheel rigidity or bradykinesia. Sensory.: intact to touch , pinprick , position and vibratory sensation.  Coordination: Rapid alternating movements normal in all extremities. Finger-to-nose and heel-to-shin performed accurately bilaterally.  Gait and Station: Deferred Reflexes: 1+ and symmetric. Toes downgoing.       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  She has had extensive negative neurovascular (MRI brain unremarkable, CTA head/neck unremarkable, EEG normal) and cardiac work-up in the past she also has  memory loss and mild age-related cognitive impairment.   Referral placed to [facility] neurology for second opinion on recurrent falls Continue working with PT, advised to ask PCP to add vestibular therapy to current [facility] PT orders Discussed using wheelchair for long distance, okay to use rolling walker for short distance when feeling well Discussed further fall prevention/precautions measures     I spent 26 minutes of face-to-face and non-face-to-face time with patient, daughter and care coordinator.  This included previsit chart review, lab review, study review, order entry, electronic health record documentation, patient education and discussion regarding recurrent falls, repeat extensive work-up, review of recent ED admissions, further treatment plan and answered all the questions to patient satisfaction  Frann Rider, [facility]  [facility] [address] [place], [place] [address]  Phone [phone] Fax [phone] Note: This document was prepared with digital dictation and possible smart phrase technology. Any transcriptional errors that result from this process are unintentional.  CC:  Celene Squibb, MD

## 2022-02-02 ENCOUNTER — Ambulatory Visit: Payer: Medicare Other | Admitting: Adult Health

## 2022-02-02 ENCOUNTER — Encounter: Payer: Self-pay | Admitting: Adult Health

## 2022-02-02 VITALS — BP 147/62 | HR 73 | Ht 60.0 in | Wt 112.6 lb

## 2022-02-02 DIAGNOSIS — R296 Repeated falls: Secondary | ICD-10-CM | POA: Diagnosis not present

## 2022-02-02 DIAGNOSIS — R55 Syncope and collapse: Secondary | ICD-10-CM | POA: Diagnosis not present

## 2022-02-02 NOTE — Patient Instructions (Addendum)
Your Plan:  Extensive neurological work-up has been largely unremarkable for reasons of your recurrent falls  Recommend evaluation by an academic center as you have also seen multiple specialists without a clear cause - referral will be placed to [facility]  Highly recommend use of wheelchair for long distance, okay to use walker to short distance as long as your legs feel okay. Continue working with PT       Thank you for coming to see [place] at [facility]. I hope we have been able to provide you high quality care today.  You may receive a patient satisfaction survey over the next few weeks. We would appreciate your feedback and comments so that we may continue to improve ourselves and the health of our patients.

## 2022-02-07 ENCOUNTER — Telehealth: Payer: Self-pay | Admitting: Psychiatry

## 2022-02-07 NOTE — Telephone Encounter (Signed)
Referral sent to [facility] [phone]

## 2022-07-27 ENCOUNTER — Encounter: Payer: Self-pay | Admitting: Radiology

## 2022-11-12 ENCOUNTER — Emergency Department (HOSPITAL_COMMUNITY): Payer: Medicare Other

## 2022-11-12 ENCOUNTER — Other Ambulatory Visit: Payer: Self-pay

## 2022-11-12 ENCOUNTER — Emergency Department (HOSPITAL_COMMUNITY)
Admission: EM | Admit: 2022-11-12 | Discharge: 2022-11-12 | Disposition: A | Discharge status: Home / Self Care | Payer: Medicare Other | Attending: Emergency Medicine | Admitting: Emergency Medicine

## 2022-11-12 ENCOUNTER — Encounter (HOSPITAL_COMMUNITY): Payer: Self-pay

## 2022-11-12 DIAGNOSIS — I10 Essential (primary) hypertension: Secondary | ICD-10-CM | POA: Diagnosis not present

## 2022-11-12 DIAGNOSIS — W06XXXA Fall from bed, initial encounter: Secondary | ICD-10-CM | POA: Insufficient documentation

## 2022-11-12 DIAGNOSIS — Z7982 Long term (current) use of aspirin: Secondary | ICD-10-CM | POA: Insufficient documentation

## 2022-11-12 DIAGNOSIS — W19XXXA Unspecified fall, initial encounter: Secondary | ICD-10-CM

## 2022-11-12 DIAGNOSIS — E119 Type 2 diabetes mellitus without complications: Secondary | ICD-10-CM | POA: Diagnosis not present

## 2022-11-12 DIAGNOSIS — J45909 Unspecified asthma, uncomplicated: Secondary | ICD-10-CM | POA: Diagnosis not present

## 2022-11-12 DIAGNOSIS — E039 Hypothyroidism, unspecified: Secondary | ICD-10-CM | POA: Diagnosis not present

## 2022-11-12 DIAGNOSIS — Z7951 Long term (current) use of inhaled steroids: Secondary | ICD-10-CM | POA: Insufficient documentation

## 2022-11-12 DIAGNOSIS — Y92003 Bedroom of unspecified non-institutional (private) residence as the place of occurrence of the external cause: Secondary | ICD-10-CM | POA: Diagnosis not present

## 2022-11-12 DIAGNOSIS — Z853 Personal history of malignant neoplasm of breast: Secondary | ICD-10-CM | POA: Insufficient documentation

## 2022-11-12 DIAGNOSIS — S0083XA Contusion of other part of head, initial encounter: Secondary | ICD-10-CM

## 2022-11-12 DIAGNOSIS — Z79899 Other long term (current) drug therapy: Secondary | ICD-10-CM | POA: Insufficient documentation

## 2022-11-12 NOTE — ED Provider Notes (Signed)
[place] EMERGENCY DEPARTMENT AT [facility] Provider Note   CSN: [id number] Arrival date & time: 11/12/22  1025     History  No chief complaint on file.   Angela Galvan is a 89 y.o. female.  Pt is an 89 yo female with pmhx significant for diverticulitis, ibs, hld, gerd, arthritis, hypothyroidism, htn, cva, anxiety, diet controlled dm, asthma, breast cancer s/p mastectomy, and gait instability.  Pt said she was in her bed asleep and rolled out of bed and hit her head.  She denies any pain. She does not feel like she had a loc.  No blood thinners.       Home Medications Prior to Admission medications   Medication Sig Start Date End Date Taking? Authorizing Provider  albuterol (VENTOLIN HFA) 108 (90 Base) MCG/ACT inhaler INHALE 2 PUFFS EVERY 6 HOURS AS NEEDED FOR WHEEZING OR SHORTNESS OF BREATH Patient taking differently: Inhale 2 puffs into the lungs every 6 (six) hours as needed for wheezing or shortness of breath. 07/29/19   Angela Cutter, MD  aspirin EC 81 MG tablet Take 81 mg by mouth daily. Swallow whole.    [provider]  atorvastatin (LIPITOR) 10 MG tablet Take 1 tablet (10 mg total) by mouth daily after breakfast. 05/05/19   Corum, Minerva Fester, MD  B Complex Vitamins (B COMPLEX PO) Take 1 tablet by mouth daily. W/Biotin and folic acid    [provider]  benzonatate (TESSALON) 200 MG capsule benzonatate 200 mg capsule  Take 1 capsule every 12 hours by oral route for 10 days.    [provider]  busPIRone (BUSPAR) 5 MG tablet Take 5 mg by mouth 2 (two) times daily. 08/06/21   [provider]  celecoxib (CELEBREX) 200 MG capsule celecoxib 200 mg capsule    [provider]  Cholecalciferol (D3-1000) 25 MCG (1000 UT) capsule Take 1,000 Units by mouth daily.    [provider]  diclofenac Sodium (VOLTAREN) 1 % GEL Apply topically. 06/01/21   [provider]  ergocalciferol (VITAMIN D2) 1.25 MG (50000 UT)  capsule ergocalciferol (vitamin D2) 1,250 mcg (50,000 unit) capsule  Take 1 capsule every day by oral route for 56 days. Patient not taking: Reported on 02/02/2022    [provider]  gabapentin (NEURONTIN) 100 MG capsule Take by mouth. 07/21/21   [provider]  Ibuprofen 200 MG CAPS Advil    [provider]  levothyroxine (SYNTHROID) 75 MCG tablet Take 1 tablet (75 mcg total) by mouth daily. 07/11/19   Corum, Minerva Fester, MD  loperamide (IMODIUM) 1 MG/5ML solution     [provider]  loperamide (IMODIUM) 2 MG capsule Take 2 mg by mouth as needed for diarrhea or loose stools.    [provider]  loratadine (CLARITIN) 10 MG tablet Take 10 mg by mouth daily.    [provider]  losartan (COZAAR) 100 MG tablet Take 1 tablet (100 mg total) by mouth daily. 05/05/19   Corum, Minerva Fester, MD  RABEprazole (ACIPHEX) 20 MG tablet TAKE 1 TABLET BY MOUTH 2 TIMES DAILY Patient taking differently: Take 20 mg by mouth in the morning and at bedtime. 12/26/18   Angela Kocher, PA-C  RESTASIS 0.05 % ophthalmic emulsion Place 1 drop into both eyes 2 (two) times daily.  08/21/19   [provider]  sertraline (ZOLOFT) 100 MG tablet Take 100 mg by mouth daily.    [provider]  Angela Galvan 100-62.5-25 MCG/INH  AEPB Inhale 1 puff into the lungs daily. 08/18/20   [provider]      Allergies    Patient has no known allergies.    Review of Systems   Review of Systems  HENT:         Head contusion  All other systems reviewed and are negative.   Physical Exam Updated Vital Signs BP 125/67 (BP Location: Left Arm)   Pulse 66   Temp 97.9 F (36.6 C) (Oral)   Resp 14   Ht 5' (1.524 m)   Wt 50.3 kg   SpO2 97%   BMI 21.68 kg/m  Physical Exam Vitals and nursing note reviewed.  Constitutional:      Appearance: Normal appearance.  HENT:     Head: Normocephalic.     Comments: Quarter sized hematoma right forehead    Right Ear:  External ear normal.     Left Ear: External ear normal.     Nose: Nose normal.     Mouth/Throat:     Mouth: Mucous membranes are moist.     Pharynx: Oropharynx is clear.  Eyes:     Extraocular Movements: Extraocular movements intact.     Conjunctiva/sclera: Conjunctivae normal.     Pupils: Pupils are equal, round, and reactive to light.  Cardiovascular:     Rate and Rhythm: Normal rate and regular rhythm.     Pulses: Normal pulses.     Heart sounds: Normal heart sounds.  Pulmonary:     Effort: Pulmonary effort is normal.     Breath sounds: Normal breath sounds.  Abdominal:     General: Abdomen is flat. Bowel sounds are normal.     Palpations: Abdomen is soft.  Musculoskeletal:        General: Normal range of motion.     Cervical back: Normal range of motion and neck supple.  Skin:    General: Skin is warm.     Capillary Refill: Capillary refill takes less than 2 seconds.  Neurological:     General: No focal deficit present.     Mental Status: She is alert and oriented to person, place, and time.  Psychiatric:        Mood and Affect: Mood normal.        Behavior: Behavior normal.     ED Results / Procedures / Treatments   Labs (all labs ordered are listed, but only abnormal results are displayed) Labs Reviewed - No data to display  EKG None  Radiology CT Head Wo Contrast  Result Date: 11/12/2022 CLINICAL DATA:  89 year old female status post fall from bed striking head on floor. EXAM: CT HEAD WITHOUT CONTRAST TECHNIQUE: Contiguous axial images were obtained from the base of the skull through the vertex without intravenous contrast. RADIATION DOSE REDUCTION: This exam was performed according to the departmental dose-optimization program which includes automated exposure control, adjustment of the mA and/or kV according to patient size and/or use of iterative reconstruction technique. COMPARISON:  Brain MRI and Head CT08/07/2021. FINDINGS: Brain: Right pontine  microhemorrhage has resolved since last year. Chronic cerebral volume loss with ex vacuo appearing ventricular enlargement. Confluent bilateral cerebral white matter hypodensity is chronic. No superimposed midline shift, ventriculomegaly, mass effect, evidence of mass lesion, intracranial hemorrhage or evidence of cortically based acute infarction. Stable gray-white matter differentiation throughout the brain. Vascular: No suspicious intracranial vascular hyperdensity. Skull: No fracture identified. Sinuses/Orbits: Visualized paranasal sinuses and mastoids are stable and well aerated. Other: Right forehead localized scalp hematoma, up  to 8 mm in thickness. Underlying right frontal bone and hypoplastic frontal sinus appear intact. Visible orbits soft tissues within normal limits. IMPRESSION: 1. Right forehead scalp hematoma without underlying skull fracture. 2. No acute intracranial abnormality. Resolved pontine microhemorrhage seen last year. Advanced chronic white matter disease. Electronically Signed   By: Odessa Fleming M.D.   On: 11/12/2022 12:36   CT Cervical Spine Wo Contrast  Result Date: 11/12/2022 CLINICAL DATA:  89 year old female status post fall from bed striking head on floor. EXAM: CT CERVICAL SPINE WITHOUT CONTRAST TECHNIQUE: Multidetector CT imaging of the cervical spine was performed without intravenous contrast. Multiplanar CT image reconstructions were also generated. RADIATION DOSE REDUCTION: This exam was performed according to the departmental dose-optimization program which includes automated exposure control, adjustment of the mA and/or kV according to patient size and/or use of iterative reconstruction technique. COMPARISON:  Head CT today.  Cervical spine CT 12/29/2021. FINDINGS: Alignment: Chronic straightening of cervical lordosis, mild reversal today. Mild chronic degenerative appearing anterolisthesis of C4 on C5 is stable. Cervicothoracic junction alignment is within normal limits.  Bilateral posterior element alignment is within normal limits. Skull base and vertebrae: Visualized skull base is intact. No atlanto-occipital dissociation. C1 and C2 appear intact and aligned. No acute osseous abnormality identified. In the cervical spine. Soft tissues and spinal canal: No prevertebral fluid or swelling. No visible canal hematoma. Negative visible noncontrast neck soft tissues; right carotid calcified atherosclerosis. Disc levels: Partially calcified ligamentous hypertrophy about the odontoid. But otherwise generally mild for age cervical spine degeneration. Upper chest: Mild superior endplate compression fractures of T2 and T3, the former is new from last year, and both levels were normal on a 2020 chest CT (05/31/2018). No retropulsion. The T1 level appears to remain intact. Mild apical lung scarring. Negative visible noncontrast superior mediastinum. IMPRESSION: 1. No acute traumatic injury identified in the cervical spine. Mild for age cervical spine degeneration. 2. Age indeterminate mild T2 and T3 compression fractures, new from prior exams. Electronically Signed   By: Odessa Fleming M.D.   On: 11/12/2022 12:20   DG Pelvis Portable  Result Date: 11/12/2022 CLINICAL DATA:  Fall.  Pelvic pain. EXAM: PORTABLE PELVIS 1-2 VIEWS COMPARISON:  12/29/2021 FINDINGS: There is no evidence of pelvic fracture or diastasis. 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 IMPRESSION: No pelvic fracture or other acute findings. Electronically Signed   By: Danae Orleans M.D.   On: 11/12/2022 12:08   DG Chest Portable 1 View  Result Date: 11/12/2022 CLINICAL DATA:  Fall EXAM: PORTABLE CHEST 1 VIEW COMPARISON:  12/29/2021 FINDINGS: The heart size and mediastinal contours are within normal limits. Both lungs are clear. No pneumothorax. The visualized skeletal structures are unremarkable. IMPRESSION: No active disease. Electronically Signed   By: Duanne Guess D.O.   On: 11/12/2022 12:05     Procedures Procedures    Medications Ordered in ED Medications - No data to display  ED Course/ Medical Decision Making/ A&P                             Medical Decision Making Amount and/or Complexity of Data Reviewed Radiology: ordered.   This patient presents to the ED for concern of fall, this involves an extensive number of treatment options, and is a complaint that carries with it a high risk of complications and morbidity.  The differential diagnosis includes multiple trauma   Co morbidities that complicate the patient  evaluation  diverticulitis, ibs, hld, gerd, arthritis, hypothyroidism, htn, cva, anxiety, diet controlled dm, asthma, breast cancer s/p mastectomy, and gait instability   Additional history obtained:  Additional history obtained from epic chart review External records from outside source obtained and reviewed including EMS report  Imaging Studies ordered:  I ordered imaging studies including cxr, pelvis, ct head and c-spine  I independently visualized and interpreted imaging which showed  CXR: No active disease.  Pelvis: No pelvic fracture or other acute findings.  CT c-spine:  No acute traumatic injury identified in the cervical spine. Mild  for age cervical spine degeneration.  2. Age indeterminate mild T2 and T3 compression fractures, new from  prior exams.  CT head: Right forehead scalp hematoma without underlying skull fracture.  2. No acute intracranial abnormality. Resolved pontine  microhemorrhage seen last year. Advanced chronic white matter  disease.   I agree with the radiologist interpretation   Cardiac Monitoring:  The patient was maintained on a cardiac monitor.  I personally viewed and interpreted the cardiac monitored which showed an underlying rhythm of: nsr   Medicines ordered and prescription drug management:   I have reviewed the patients home medicines and have made adjustments as needed   Test  Considered:  ct   Critical Interventions:  ct  Problem List / ED Course:  Fall:  forehead hematoma.  No other injuries.  Pt is stable for d/c.  Return if worse.   Reevaluation:  After the interventions noted above, I reevaluated the patient and found that they have :improved   Social Determinants of Health:  Lives in    Dispostion:  After consideration of the diagnostic results and the patients response to treatment, I feel that the patent would benefit from discharge with outpatient f/u.          Final Clinical Impression(s) / ED Diagnoses Final diagnoses:  Fall, initial encounter    Rx / DC Orders ED Discharge Orders     None         Jacalyn Lefevre, MD 11/12/22 1243

## 2022-11-12 NOTE — ED Triage Notes (Signed)
Per EMS  Fall Rolled out of bed Hit head on floor   Aox4 with EMS 118/62 BP 71 HR 98% RA

## 2023-03-03 ENCOUNTER — Emergency Department (HOSPITAL_COMMUNITY)
Admission: EM | Admit: 2023-03-03 | Discharge: 2023-03-03 | Disposition: A | Discharge status: Home / Self Care | Payer: Medicare Other | Attending: Emergency Medicine | Admitting: Emergency Medicine

## 2023-03-03 ENCOUNTER — Emergency Department (HOSPITAL_COMMUNITY): Payer: Medicare Other

## 2023-03-03 ENCOUNTER — Other Ambulatory Visit: Payer: Self-pay

## 2023-03-03 DIAGNOSIS — M25551 Pain in right hip: Secondary | ICD-10-CM | POA: Insufficient documentation

## 2023-03-03 DIAGNOSIS — S0990XA Unspecified injury of head, initial encounter: Secondary | ICD-10-CM | POA: Diagnosis present

## 2023-03-03 DIAGNOSIS — S0003XA Contusion of scalp, initial encounter: Secondary | ICD-10-CM | POA: Diagnosis not present

## 2023-03-03 DIAGNOSIS — I10 Essential (primary) hypertension: Secondary | ICD-10-CM | POA: Insufficient documentation

## 2023-03-03 DIAGNOSIS — E119 Type 2 diabetes mellitus without complications: Secondary | ICD-10-CM | POA: Insufficient documentation

## 2023-03-03 DIAGNOSIS — W19XXXA Unspecified fall, initial encounter: Secondary | ICD-10-CM | POA: Insufficient documentation

## 2023-03-03 DIAGNOSIS — T148XXA Other injury of unspecified body region, initial encounter: Secondary | ICD-10-CM

## 2023-03-03 MED ORDER — ACETAMINOPHEN 325 MG PO TABS
650.0000 mg | ORAL_TABLET | Freq: Once | ORAL | Status: AC
  Administered 2023-03-03: 650 mg via ORAL
  Filled 2023-03-03: qty 2

## 2023-03-03 NOTE — ED Triage Notes (Signed)
Pt arrived [facility] from [facility] from a fall. Pt normally ambulates with a walker and she decided she wanted to walk without it and fell. Pt has a hematoma to the back of her head. C-collar placed by [facility]. Pt c/o pain to the back of her head. DNR with PT. No vomiting or blood thinners noted.

## 2023-03-03 NOTE — Discharge Instructions (Signed)
Please use Tylenol for pain.  You may use 1000 mg of Tylenol every 6 hours.  Not to exceed 4 g of Tylenol within 24 hours.  

## 2023-03-03 NOTE — ED Provider Notes (Signed)
[place] [facility]GENC[facility] DEPARTMENT AT [facility] Provider Note   CSN: [id number] Arrival date & time: 03/03/23  1217     History  Chief Complaint  Patient presents with   Fall    Angela Galvan is a 89 y.o. female past medical history significant for GERD, Behcet's disease, previous DCIS of left breast, diabetes, hard of hearing, hypertension, anxiety who arrives from [facility] care facility from a fall.  Patient normally ambulates with a walker but wanted to walk without it, fell backwards and struck her head.  Complains of pain to the back of the head.  No vomiting, no blood thinner usage. DNR.   Fall       Home Medications Prior to Admission medications   Medication Sig Start Date End Date Taking? Authorizing Provider  albuterol (VENTOLIN HFA) 108 (90 Base) MCG/ACT inhaler INHALE 2 PUFFS EVERY 6 HOURS AS NEEDED FOR WHEEZING OR SHORTNESS OF BREATH Patient taking differently: Inhale 2 puffs into the lungs every 6 (six) hours as needed for wheezing or shortness of breath. 07/29/19   Luciano Cutter, MD  aspirin EC 81 MG tablet Take 81 mg by mouth daily. Swallow whole.    [provider]  atorvastatin (LIPITOR) 10 MG tablet Take 1 tablet (10 mg total) by mouth daily after breakfast. 05/05/19   Corum, Minerva Fester, MD  B Complex Vitamins (B COMPLEX PO) Take 1 tablet by mouth daily. W/Biotin and folic acid    [provider]  benzonatate (TESSALON) 200 MG capsule benzonatate 200 mg capsule  Take 1 capsule every 12 hours by oral route for 10 days.    [provider]  busPIRone (BUSPAR) 5 MG tablet Take 5 mg by mouth 2 (two) times daily. 08/06/21   [provider]  celecoxib (CELEBREX) 200 MG capsule celecoxib 200 mg capsule    [provider]  Cholecalciferol (D3-1000) 25 MCG (1000 UT) capsule Take 1,000 Units by mouth daily.    [provider]  diclofenac Sodium (VOLTAREN) 1 % GEL Apply topically. 06/01/21   [provider]   ergocalciferol (VITAMIN D2) 1.25 MG (50000 UT) capsule ergocalciferol (vitamin D2) 1,250 mcg (50,000 unit) capsule  Take 1 capsule every day by oral route for 56 days. Patient not taking: Reported on 02/02/2022    [provider]  gabapentin (NEURONTIN) 100 MG capsule Take by mouth. 07/21/21   [provider]  Ibuprofen 200 MG CAPS Advil    [provider]  levothyroxine (SYNTHROID) 75 MCG tablet Take 1 tablet (75 mcg total) by mouth daily. 07/11/19   Corum, Minerva Fester, MD  loperamide (IMODIUM) 1 MG/5ML solution     [provider]  loperamide (IMODIUM) 2 MG capsule Take 2 mg by mouth as needed for diarrhea or loose stools.    [provider]  loratadine (CLARITIN) 10 MG tablet Take 10 mg by mouth daily.    [provider]  losartan (COZAAR) 100 MG tablet Take 1 tablet (100 mg total) by mouth daily. 05/05/19   Corum, Minerva Fester, MD  RABEprazole (ACIPHEX) 20 MG tablet TAKE 1 TABLET BY MOUTH 2 TIMES DAILY Patient taking differently: Take 20 mg by mouth in the morning and at bedtime. 12/26/18   Tiffany Kocher, PA-C  RESTASIS 0.05 % ophthalmic emulsion Place 1 drop into both eyes 2 (two) times daily.  08/21/19   [provider]  sertraline (ZOLOFT) 100 MG tablet Take 100 mg by mouth daily.    [provider]  TRELEGY ELLIPTA 100-62.5-25 MCG/INH AEPB Inhale 1 puff into the lungs daily. 08/18/20   [provider]      Allergies    Patient has no known allergies.    Review of Systems   Review of Systems  All other systems reviewed and are negative.   Physical Exam Updated Vital Signs BP (!) 165/66   Pulse 73   Temp 98.1 F (36.7 C) (Oral)   Resp 17   Ht 5\' 6"  (1.676 m)   Wt 50.3 kg   SpO2 94%   BMI 17.90 kg/m  Physical Exam Vitals and nursing note reviewed.  Constitutional:      General: She is not in acute distress.    Appearance: Normal appearance.  HENT:     Head: Normocephalic and atraumatic.  Eyes:      General:        Right eye: No discharge.        Left eye: No discharge.  Cardiovascular:     Rate and Rhythm: Normal rate and regular rhythm.     Heart sounds: No murmur heard.    No friction rub. No gallop.  Pulmonary:     Effort: Pulmonary effort is normal.     Breath sounds: Normal breath sounds.  Abdominal:     General: Bowel sounds are normal.     Palpations: Abdomen is soft.  Musculoskeletal:     Comments: Intact strength 5/5 bilateral upper and lower extremities, some generalized weakness throughout.  Normal coordination throughout.  Skin:    General: Skin is warm and dry.     Capillary Refill: Capillary refill takes less than 2 seconds.     Comments: Hematoma without laceration noted to posterior scalp  Neurological:     Mental Status: She is alert and oriented to person, place, and time.  Psychiatric:        Mood and Affect: Mood normal.        Behavior: Behavior normal.     ED Results / Procedures / Treatments   Labs (all labs ordered are listed, but only abnormal results are displayed) Labs Reviewed - No data to display  EKG None  Radiology DG Hip Unilat W or Wo Pelvis 2-3 Views Right  Result Date: 03/03/2023 CLINICAL DATA:  Fall this morning.  Right-sided hip pain. EXAM: DG HIP (WITH OR WITHOUT PELVIS) 2-3V RIGHT COMPARISON:  [facility] pelvis 11/12/2022, pelvis and right hip radiographs 08/24/2009 FINDINGS: There is diffuse decreased bone mineralization. Mild right femoral head-neck junction degenerative osteophytes. The right femoroacetabular joint space is maintained. Mild-to-moderate pubic symphysis joint space narrowing about superior osteophytosis. Mild bilateral sacroiliac subchondral sclerosis. There are again mixed lytic and sclerotic chronic lesions with cortical thickening within the left femoral neck through the visualized proximal femoral diaphysis, not significantly changed from prior and again suggestive of fibrous dysplasia. No acute fracture or dislocation.  Surgical clips overlie the mid upper pelvis. IMPRESSION: 1. No acute fracture or dislocation. 2. Mild right hip osteoarthritis. 3. 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 4. 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 Electronically Signed   By: Neita Garnet M.D.   On: 03/03/2023 15:33   CT Head Wo Contrast  Result Date: 03/03/2023 CLINICAL DATA:  Fall, hematoma to back of head EXAM: CT HEAD WITHOUT CONTRAST CT CERVICAL SPINE WITHOUT CONTRAST TECHNIQUE: Multidetector CT imaging of the head and cervical spine was performed following the standard protocol without intravenous contrast. Multiplanar CT image reconstructions of the cervical spine were  also generated. RADIATION DOSE REDUCTION: This exam was performed according to the departmental dose-optimization program which includes automated exposure control, adjustment of the mA and/or kV according to patient size and/or use of iterative reconstruction technique. COMPARISON:  11/12/2022 FINDINGS: CT HEAD FINDINGS Brain: No evidence of acute infarction, hemorrhage, hydrocephalus, extra-axial collection or mass lesion/mass effect. Extensive periventricular and deep white matter hypodensity. Vascular: No hyperdense vessel or unexpected calcification. Skull: Normal. Negative for fracture or focal lesion. Sinuses/Orbits: No acute finding. Other: Soft tissue contusion and hematoma of the right scalp vertex (series 3, image 23). CT CERVICAL SPINE FINDINGS Alignment: Normal. Skull base and vertebrae: No acute fracture. No primary bone lesion or focal pathologic process. Soft tissues and spinal canal: No prevertebral fluid or swelling. No visible canal hematoma. Disc levels:  Minimal disc degenerative disease. Upper chest: Negative. Other: None. IMPRESSION: 1. No acute intracranial pathology. Advanced small-vessel white matter disease. 2. Soft tissue contusion and hematoma of the right scalp vertex. 3. No fracture or  static subluxation of the cervical spine. Electronically Signed   By: Jearld Lesch M.D.   On: 03/03/2023 14:56   CT Cervical Spine Wo Contrast  Result Date: 03/03/2023 CLINICAL DATA:  Fall, hematoma to back of head EXAM: CT HEAD WITHOUT CONTRAST CT CERVICAL SPINE WITHOUT CONTRAST TECHNIQUE: Multidetector CT imaging of the head and cervical spine was performed following the standard protocol without intravenous contrast. Multiplanar CT image reconstructions of the cervical spine were also generated. RADIATION DOSE REDUCTION: This exam was performed according to the departmental dose-optimization program which includes automated exposure control, adjustment of the mA and/or kV according to patient size and/or use of iterative reconstruction technique. COMPARISON:  11/12/2022 FINDINGS: CT HEAD FINDINGS Brain: No evidence of acute infarction, hemorrhage, hydrocephalus, extra-axial collection or mass lesion/mass effect. Extensive periventricular and deep white matter hypodensity. Vascular: No hyperdense vessel or unexpected calcification. Skull: Normal. Negative for fracture or focal lesion. Sinuses/Orbits: No acute finding. Other: Soft tissue contusion and hematoma of the right scalp vertex (series 3, image 23). CT CERVICAL SPINE FINDINGS Alignment: Normal. Skull base and vertebrae: No acute fracture. No primary bone lesion or focal pathologic process. Soft tissues and spinal canal: No prevertebral fluid or swelling. No visible canal hematoma. Disc levels:  Minimal disc degenerative disease. Upper chest: Negative. Other: None. IMPRESSION: 1. No acute intracranial pathology. Advanced small-vessel white matter disease. 2. Soft tissue contusion and hematoma of the right scalp vertex. 3. No fracture or static subluxation of the cervical spine. Electronically Signed   By: Jearld Lesch M.D.   On: 03/03/2023 14:56    Procedures Procedures    Medications Ordered in ED Medications  acetaminophen (TYLENOL) tablet  650 mg (650 mg Oral Given 03/03/23 1358)    ED Course/ Medical Decision Making/ A&P                                 Medical Decision Making Amount and/or Complexity of Data Reviewed Radiology: ordered.  Risk OTC drugs.   This patient is a 89 y.o. female  who presents to the ED for concern of fall, head injury.   Differential diagnoses prior to evaluation: The emergent differential diagnosis includes, but is not limited to,  epidural hematoma, subdural hematoma, skull fracture, subarachnoid hemorrhage, unstable cervical spine fracture, concussion vs other MSK injury, additionally patient with some right hip pain, considered fracture, dislocation, versus other. This is not an exhaustive differential.   Past  Medical History / Co-morbidities / Social History: GERD, Behcet's disease, previous DCIS of left breast, diabetes, hard of hearing, hypertension, anxiety  Additional history: Chart reviewed. Pertinent results include: Reviewed lab work, imaging from previous emergency department visits, as well as outpatient neurology visits  Physical Exam: Physical exam performed. The pertinent findings include: Intact strength 5/5 bilateral upper and lower extremities, some generalized weakness throughout.  Normal coordination throughout.  Hematoma without laceration noted to posterior scalp   Lab Tests/Imaging studies: I personally interpreted labs/imaging and the pertinent results include: I independently interpreted CT head without contrast, CT C-spine shows no evidence of acute fracture, dislocation, or other acute C-spine injury, hematoma noted on posterior scalp.  Plain film hip x-ray additionally with no fracture, dislocation.. I agree with the radiologist interpretation.  Medications: I ordered medication including tylenol for pain.  I have reviewed the patients home medicines and have made adjustments as needed.   Disposition: After consideration of the diagnostic results and the  patients response to treatment, I feel that patient stable for discharge, encouraged Tylenol for pain, rest, hematoma should resolve over time.   emergency department workup does not suggest an emergent condition requiring admission or immediate intervention beyond what has been performed at this time. The plan is: as above. The patient is safe for discharge and has been instructed to return immediately for worsening symptoms, change in symptoms or any other concerns.  I discussed this case with my attending physician who cosigned this note including patient's presenting symptoms, physical exam, and planned diagnostics and interventions. Attending physician stated agreement with plan or made changes to plan which were implemented.   Attending physician assessed patient at bedside.  Final Clinical Impression(s) / ED Diagnoses Final diagnoses:  Fall, initial encounter  Injury of head, initial encounter  Hematoma  Right hip pain    Rx / DC Orders ED Discharge Orders     None         [place] 03/03/23 1549    Eber Hong, MD 03/04/23 [phone]

## 2023-06-12 ENCOUNTER — Ambulatory Visit: Payer: Medicare Other | Admitting: Gastroenterology

## 2023-06-12 ENCOUNTER — Encounter: Payer: Self-pay | Admitting: Gastroenterology

## 2023-06-12 VITALS — BP 146/66 | HR 74 | Temp 98.5°F | Ht 60.0 in | Wt 106.0 lb

## 2023-06-12 DIAGNOSIS — K529 Noninfective gastroenteritis and colitis, unspecified: Secondary | ICD-10-CM

## 2023-06-12 DIAGNOSIS — R197 Diarrhea, unspecified: Secondary | ICD-10-CM

## 2023-06-12 MED ORDER — RIFAXIMIN 550 MG PO TABS: 550.0000 mg | ORAL_TABLET | Freq: Three times a day (TID) | ORAL | 0 refills | Status: AC

## 2023-06-12 NOTE — Progress Notes (Signed)
 Gastroenterology Office Note     Primary Care Physician:  Shona Norleen PEDLAR, MD  Primary Gastroenterologist: Dr. Shaaron    Chief Complaint   Chief Complaint  Patient presents with   Follow-up    Pt having diarrhea off and on for about a month     History of Present Illness   Angela Galvan is an 89 y.o. female presenting today with a history of  chronic diarrhea, fecal incontinence. Chronic history of GERD, with manometry and pH/impedance without reflux during study, good esophageal motility. Known Zenker's. ANorectal manometry 2020 with weak spinchter muscles and poor coordination with BMs.   Failed Questran  in the past. Takes Imodium  as needed.    Moved to [place] on Nov 4th. Previously at [facility]. Has a few days without loose stool. More frequent loose stool since being at [place]. Eats cereal daily, drinks milk. Eats breakfast about 1130. Likes to sleep in. Doesn't like the food there. Eats supper about 430. Likes candy, cookies, chocolate. Doesn't drink much water . Not worse after eating. Stool is not greasy.   Feels lonely at times. Christopher, daughter present with her. She discussed bridge club, book club, [place], etc.    2017 colonoscopy: left colonic diverticulosis, otherwise normal.   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   Grade III, 3.5 cm, ER-/PR- left breast s/p mastectomy with SLN (0/1)   Depression    Diabetes mellitus    no meds.-diet controlled   Diarrhea    Diverticulitis    s/p colectomy in 1991   GERD (gastroesophageal reflux disease)    HOH (hard of hearing)    HTN (hypertension)    Does not see a cardiologist   Hyperlipidemia    Hypothyroidism    IBS (irritable bowel syndrome)    S/P colonoscopy 2003   minimal internal hemorrhoids, pancolonic diverticula, biopsy of rectum:  lymphoplasmacytic microscopic colitis, colon biopsies normal   S/P colonoscopy 2012   pancolonic diverticula, nl TI, external hemorrhoidal tag,/rectum bx no abnormalities   S/P endoscopy 2012   normal esophagus, stenotic pyloric channel, TTS dilation   Stroke (HCC) 08/10/2006   no residual    Past Surgical History:  Procedure Laterality Date   ABDOMINAL HYSTERECTOMY  1983   ANAL RECTAL MANOMETRY N/A 01/06/2019   Procedure: ANO RECTAL MANOMETRY;  Surgeon: Shila Gustav GAILS, MD;  Location: [facility];  Service: Endoscopy;  Laterality: N/A;   bilateral cataracts     BIOPSY  03/23/2016   Procedure: BIOPSY;  Surgeon: Lamar CHRISTELLA Shaaron, MD;  Location: [facility];  Service: Endoscopy;;  colon    BREAST BIOPSY Left 01/10/2013   Procedure: BREAST BIOPSY;  Surgeon: Oneil DELENA Budge, MD;  Location: [facility];  Service: General;  Laterality: Left;   BREAST LUMPECTOMY Right 01/29/2015   Procedure: RIGHT BREAST LUMPECTOMY;  Surgeon: Elon Pacini, MD;  Location: [facility];  Service: General;  Laterality: Right;   CHOLECYSTECTOMY     COLECTOMY     COLONOSCOPY  09/11/01   Minimal internal hemorrhoids, otherwise normal rectum . Scattered pancolonic diverticula (few) The colonic mucosa otherwise appeared normal status post biopsies and stool sampling as described above.   COLONOSCOPY N/A 03/23/2016   left colonic diverticulosis, otherwise normal.    ESOPHAGEAL MANOMETRY  N/A 05/13/2018   Procedure: ESOPHAGEAL MANOMETRY (EM);  Surgeon: Shila Gustav GAILS, MD;  Location: [facility];  Service: Endoscopy;  Laterality: N/A;  Dr Lary   ESOPHAGOGASTRODUODENOSCOPY  10/11/2010   Normal esophagus, small hiatal hernia,Stenotic pyloric channel likely critical.  Likely contributing to some of the patient's symptoms, status post TTS balloon dilation Normal D1-D3 status post biopsy.  D2-D3 atrophic-appearing gastric mucosa status post biopsy   ileocolonoscopy  10/11/2010   external hemorrhoidal tag.   Normal rectum status post biopsy Pancolonic diverticula status post segmental biopsy.  Normal  terminal ileum   JOINT REPLACEMENT Bilateral    KNEE ARTHROPLASTY Right 2004   KNEE SURGERY Bilateral    2004- [facility]- total; knee   PH IMPEDANCE STUDY N/A 05/13/2018   Procedure: PH IMPEDANCE STUDY;  Surgeon: Shila Gustav GAILS, MD;  Location: [facility];  Service: Endoscopy;  Laterality: N/A;  On PPI   right ovarian tumor removed as teenager     SIMPLE MASTECTOMY WITH AXILLARY SENTINEL NODE BIOPSY Left 03/07/2013   Procedure: TOTAL  MASTECTOMY WITH AXILLARY SENTINEL NODE BIOPSY;  Surgeon: Elon CHRISTELLA Pacini, MD;  Location: [facility];  Service: General;  Laterality: Left;   TOTAL KNEE ARTHROPLASTY  09/25/2011   Procedure: TOTAL KNEE ARTHROPLASTY;  Surgeon: Dempsey GAILS Moan, MD;  Location: [facility];  Service: Orthopedics;  Laterality: Left;    Current Outpatient Medications  Medication Sig Dispense Refill   albuterol  (VENTOLIN  HFA) 108 (90 Base) MCG/ACT inhaler INHALE 2 PUFFS EVERY 6 HOURS AS NEEDED FOR WHEEZING OR SHORTNESS OF BREATH (Patient taking differently: Inhale 2 puffs into the lungs every 6 (six) hours as needed for wheezing or shortness of breath.) 8.5 g 3   aspirin  EC 81 MG tablet Take 81 mg by mouth daily. Swallow whole.     atorvastatin  (LIPITOR) 10 MG tablet Take 1 tablet (10 mg total) by mouth daily after breakfast. 90 tablet 0   B Complex Vitamins (B COMPLEX PO) Take 1 tablet by mouth daily. W/Biotin and folic acid     benzonatate  (TESSALON ) 200 MG capsule benzonatate  200 mg capsule  Take 1 capsule every 12 hours by oral route for 10 days.     busPIRone (BUSPAR) 5 MG tablet Take 5 mg by mouth 2 (two) times daily.     celecoxib (CELEBREX) 200 MG capsule celecoxib 200 mg capsule     Cholecalciferol (D3-1000) 25 MCG (1000 UT) capsule Take 1,000 Units by mouth daily.     diclofenac Sodium (VOLTAREN) 1 % GEL Apply topically.     ergocalciferol (VITAMIN D2) 1.25 MG (50000 UT) capsule  ergocalciferol (vitamin D2) 1,250 mcg (50,000 unit) capsule  Take 1 capsule every day by oral route for 56 days. (Patient not taking: Reported on 02/02/2022)     gabapentin  (NEURONTIN ) 100 MG capsule Take by mouth.     Ibuprofen 200 MG CAPS Advil     levothyroxine  (SYNTHROID ) 75 MCG tablet Take 1 tablet (75 mcg total) by mouth daily. 90 tablet 3   loperamide  (IMODIUM ) 1 MG/5ML solution      loperamide  (IMODIUM ) 2 MG capsule Take 2 mg by mouth as needed for diarrhea or loose stools.     loratadine (CLARITIN) 10 MG tablet Take 10 mg by mouth daily.     losartan  (COZAAR ) 100 MG tablet Take 1 tablet (100 mg total) by mouth daily. 90 tablet 0   RABEprazole  (ACIPHEX ) 20 MG tablet TAKE 1 TABLET BY MOUTH 2 TIMES DAILY (Patient taking differently: Take 20 mg by  mouth in the morning and at bedtime.) 180 tablet 2   RESTASIS  0.05 % ophthalmic emulsion Place 1 drop into both eyes 2 (two) times daily.      sertraline (ZOLOFT) 100 MG tablet Take 100 mg by mouth daily.     TRELEGY ELLIPTA 100-62.5-25 MCG/INH AEPB Inhale 1 puff into the lungs daily.     No current facility-administered medications for this visit.    Allergies as of 06/12/2023   (No Known Allergies)    Family History  Problem Relation Age of Onset   Stroke Mother        deceased after 3 strokes May 17, 2000   Diabetes Father    Heart attack Father    Stroke Maternal Grandmother        deceased after 3 strokes   Stroke Sister        twin sister   Colon cancer Neg Hx     Social History   Socioeconomic History   Marital status: Widowed    Spouse name: Not on file   Number of children: Not on file   Years of education: Not on file   Highest education level: Not on file  Occupational History   Occupation: retired    Associate Professor: RETIRED  Tobacco Use   Smoking status: Never   Smokeless tobacco: Never   Tobacco comments:    Never smoked  Vaping Use   Vaping status: Never Used  Substance and Sexual Activity   Alcohol use: Yes     Alcohol/week: 1.0 standard drink of alcohol    Types: 1 Glasses of wine per week    Comment: occassional wine   Drug use: No   Sexual activity: Never  Other Topics Concern   Not on file  Social History Narrative   02/02/22 resides at [facility]   Social Drivers of Health   Financial Resource Strain: Not on file  Food Insecurity: Not on file  Transportation Needs: Not on file  Physical Activity: Not on file  Stress: Not on file  Social Connections: Not on file  Intimate Partner Violence: Not on file     Review of Systems   Gen: Denies any fever, chills, fatigue, weight loss, lack of appetite.  CV: Denies chest pain, heart palpitations, peripheral edema, syncope.  Resp: Denies shortness of breath at rest or with exertion. Denies wheezing or cough.  GI: Denies dysphagia or odynophagia. Denies jaundice, hematemesis, fecal incontinence. GU : Denies urinary burning, urinary frequency, urinary hesitancy MS: Denies joint pain, muscle weakness, cramps, or limitation of movement.  Derm: Denies rash, itching, dry skin Psych: Denies depression, anxiety, memory loss, and confusion Heme: Denies bruising, bleeding, and enlarged lymph nodes.   Physical Exam   BP (!) 145/72   Pulse 74   Temp 98.5 F (36.9 C)   Ht 5' (1.524 m)   Wt 106 lb (48.1 kg)   BMI 20.70 kg/m  General:   Alert and oriented. Pleasant and cooperative. Well-nourished and well-developed.  Head:  Normocephalic and atraumatic. Eyes:  Without icterus Abdomen:  +BS, soft, non-tender and non-distended. No HSM noted. No guarding or rebound. No masses appreciated.  Rectal:  Deferred  Msk:  Symmetrical without gross deformities. Normal posture. Extremities:  Without edema. Neurologic:  Alert and  oriented x4;  grossly normal neurologically. Skin:  Intact without significant lesions or rashes. Psych:  Alert and cooperative. Normal mood and affect.   Assessment   Selinda B Skalski is a delightful 89 y.o. female  presenting today  with a history of  chronic diarrhea, fecal incontinence, GERD, presenting due to chronic diarrhea at the request of [facility].  Known dyssnergia with poor coordination with bowels and weak sphincter as noted on prior anorectal manometry. Failed Questran  in the past. Does not appear to be an infectious process at this point. Christopher ,daughter, is present with her and confirms this seems to be her pattern. Likely diet intake is playing a role as well. To avoid long-term meds, will trial Xifaxan  550 mg po TID for 2 weeks due to IBS-D, unable to rule out SIBO. No celiac serologies on file, so will request this as well.      PLAN    Xifaxan  550 mg TID X 2 weeks Celiac serologies Low fat diet, limit dairy Return in 6 weeks for close follow-up   Therisa MICAEL Stager, PhD, ANP-BC [facility] Gastroenter[facility]

## 2023-06-12 NOTE — Patient Instructions (Signed)
 I am recommending Xifaxan  three times a day just for 2 weeks.   It would be a good idea to decrease dairy intake and limit the high fat sweets/candies.   I have also ordered blood work to have done at [facility].  I can't wait to hear about what activity you have joined!  I enjoyed seeing you again today! I value our relationship and want to provide genuine, compassionate, and quality care. You may receive a survey regarding your visit with me, and I welcome your feedback! Thanks so much for taking the time to complete this. I look forward to seeing you again.      Therisa MICAEL Stager, PhD, ANP-BC [facility] Gastroenter[facility]

## 2023-07-24 ENCOUNTER — Ambulatory Visit: Payer: Medicare Other | Admitting: Gastroenterology

## 2023-08-07 IMAGING — CT CT HEAD W/O CM
3 of 8 series · 13 of 47 positions shown, 15 images · non-contrast
Comparison: Previous CT brain examinations including the study done
earlier today

CLINICAL DATA: Trauma, fall

EXAM:
CT HEAD WITHOUT CONTRAST
TECHNIQUE: Contiguous axial images were obtained from the base of the skull
through the vertex without intravenous contrast.

[Series 506: coronal soft · coronal · 0.28mm/px · 3 of 73 slices shown]
[im 19/73  brain]
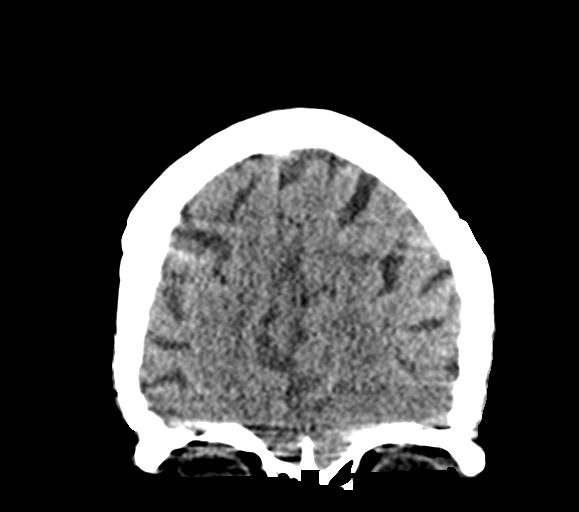
[im 37/73  brain]
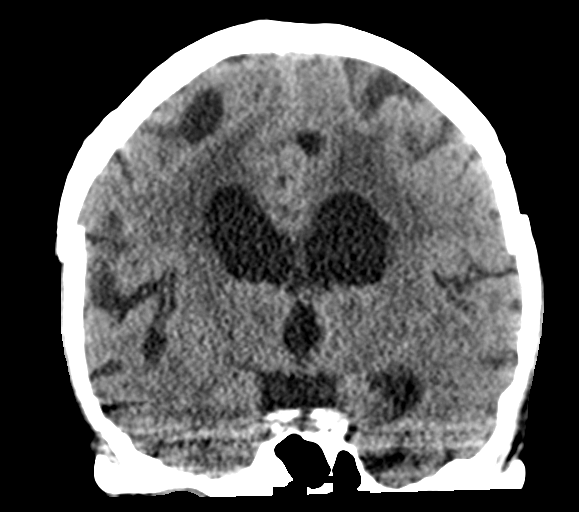
[im 55/73  brain]
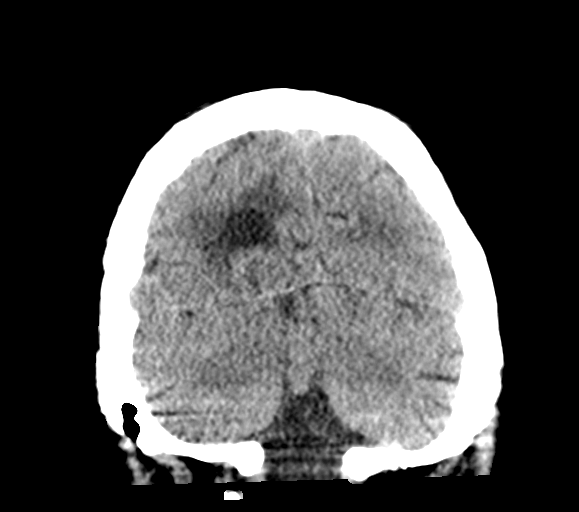

[Series 511: sagittal soft · sagittal · 0.19mm/px · 2 of 52 slices shown]
[im 18/52  brain]
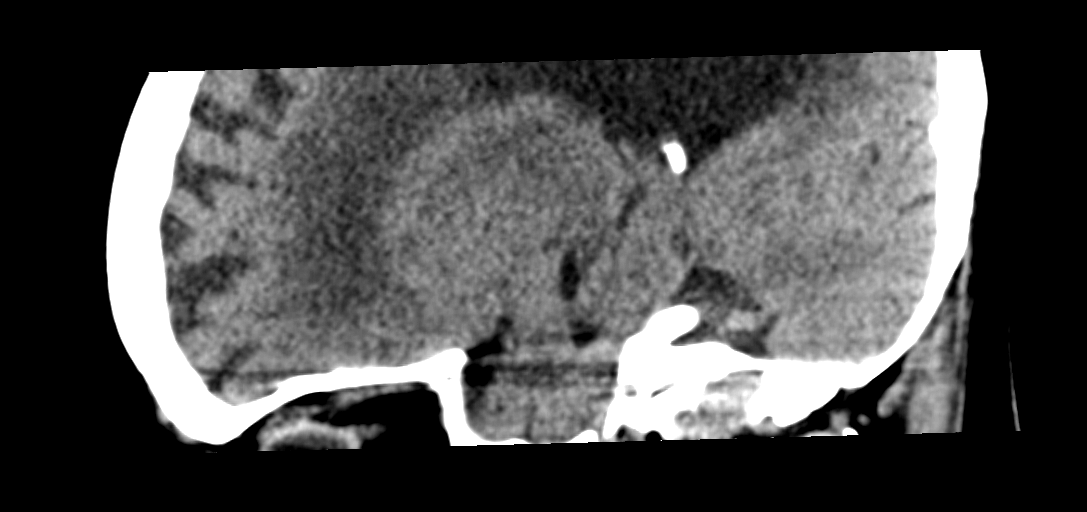
[im 35/52  brain]
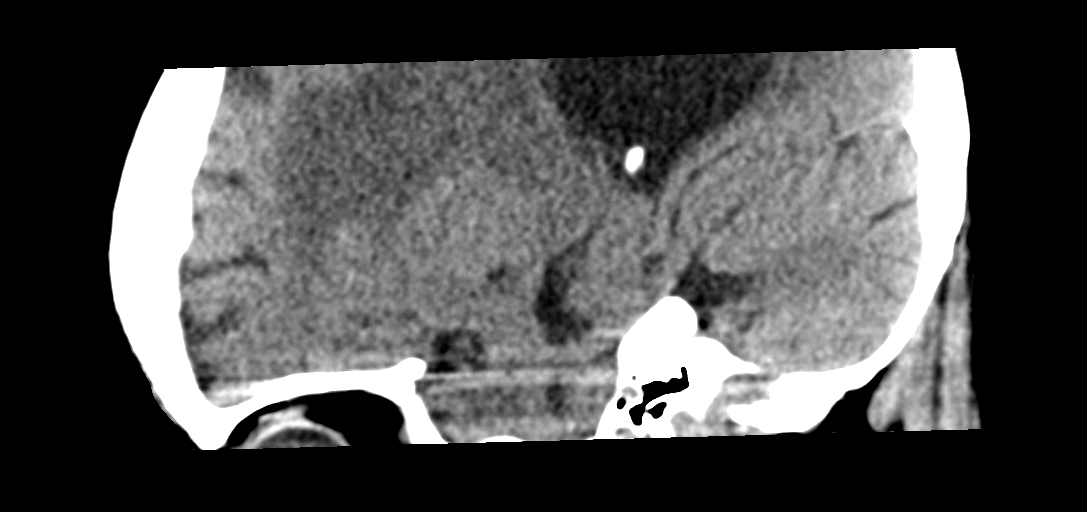

[Series 512: head ax w o · axial · 0.34mm/px · z∈[+1255,+1375]mm · 8 of 32 slices shown, 10 images]
[im 4/32  brain]
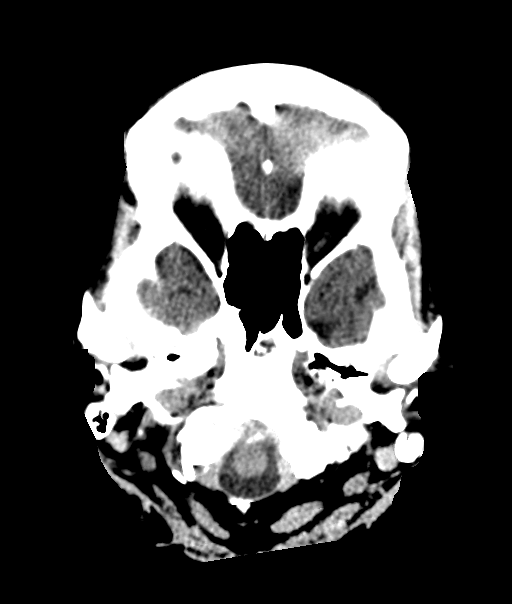
[im 4/32  bone]
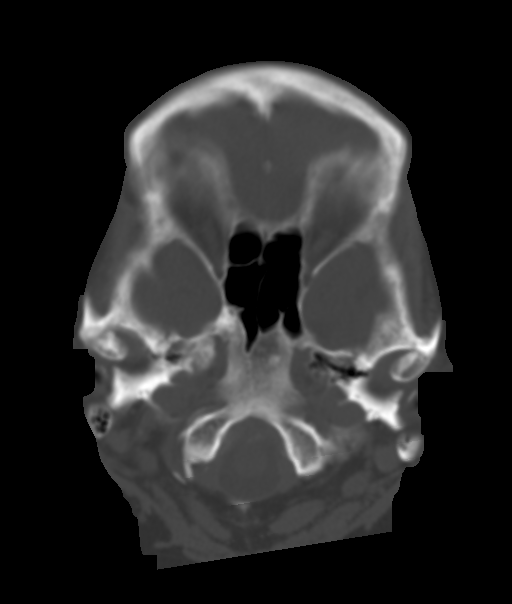
[im 7/32  brain]
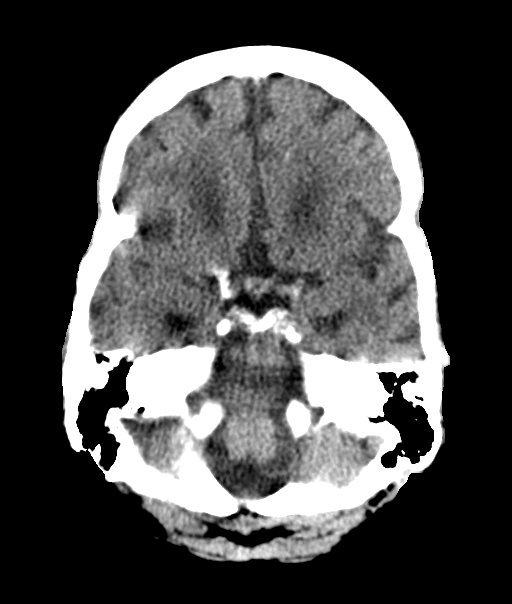
[im 11/32  brain]
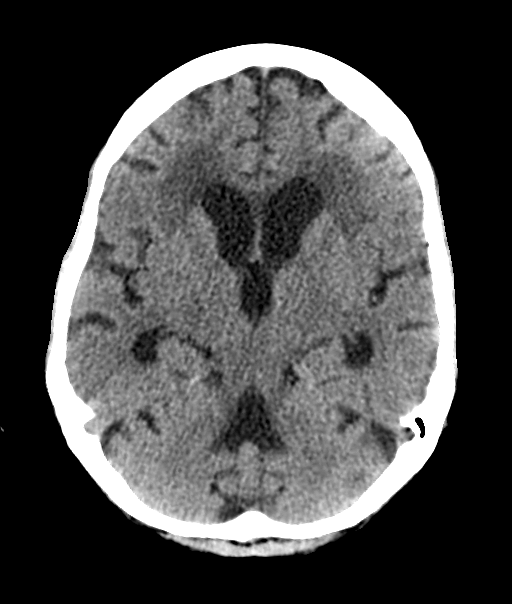
[im 14/32  brain]
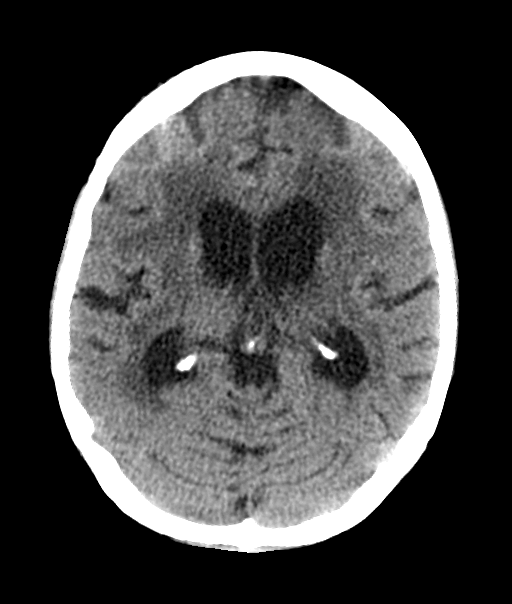
[im 18/32  brain]
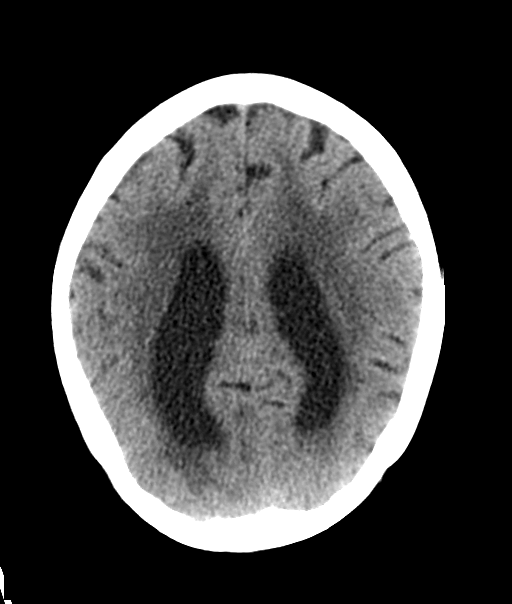
[im 18/32  bone]
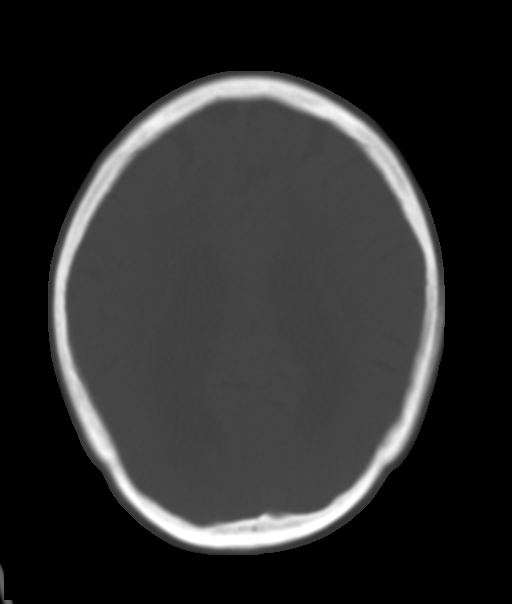
[im 21/32  brain]
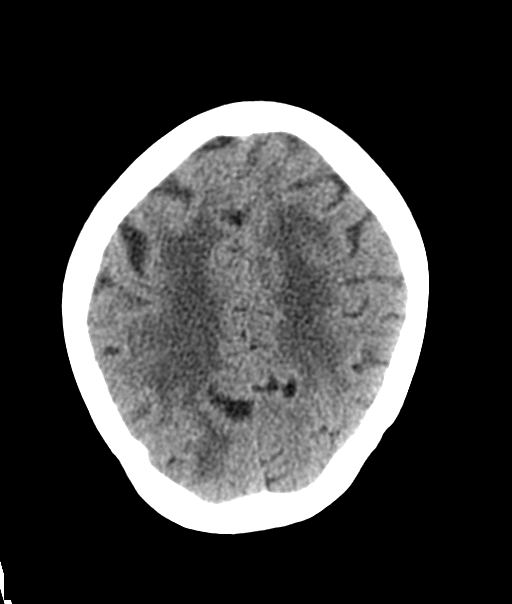
[im 25/32  brain]
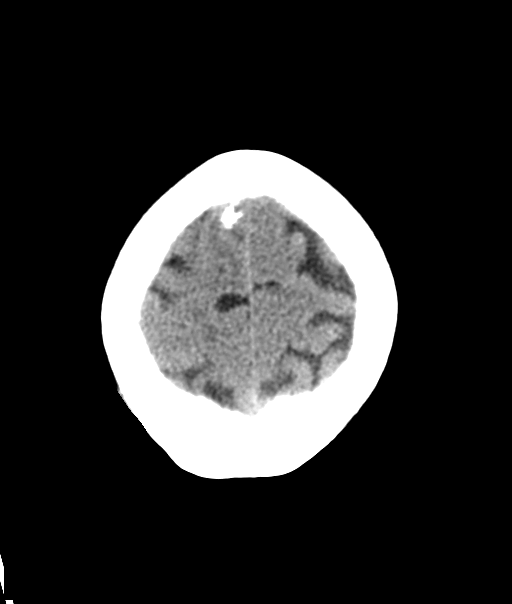
[im 28/32  brain]
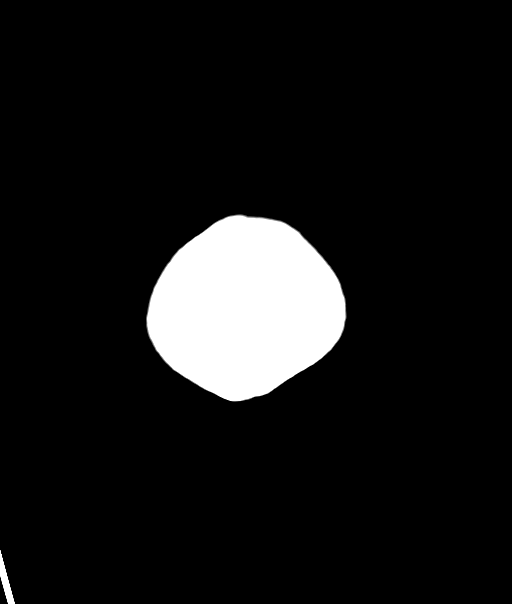

[13 of 47 positions shown; findings below may reference images not displayed]

FINDINGS: Brain: There are no signs of bleeding within the cranium. There is
dilation of third and both lateral ventricles. Cortical sulci are
prominent. There is decreased density in periventricular and
subcortical white matter.

Vascular: Unremarkable.

Skull: Unremarkable.

Sinuses/Orbits: Unremarkable.

Other: None
IMPRESSION: No acute intracranial findings are seen. Central and cortical
atrophy. Small-vessel disease.

## 2023-08-07 IMAGING — DX DG HIP (WITH OR WITHOUT PELVIS) 2-3V*L*
3 series · 3 of 3 positions shown · non-contrast
Comparison: 12/01/2019

CLINICAL DATA: Fell today.  Left hip pain.

EXAM:
DG HIP (WITH OR WITHOUT PELVIS) 2-3V LEFT

[pelvis ap]
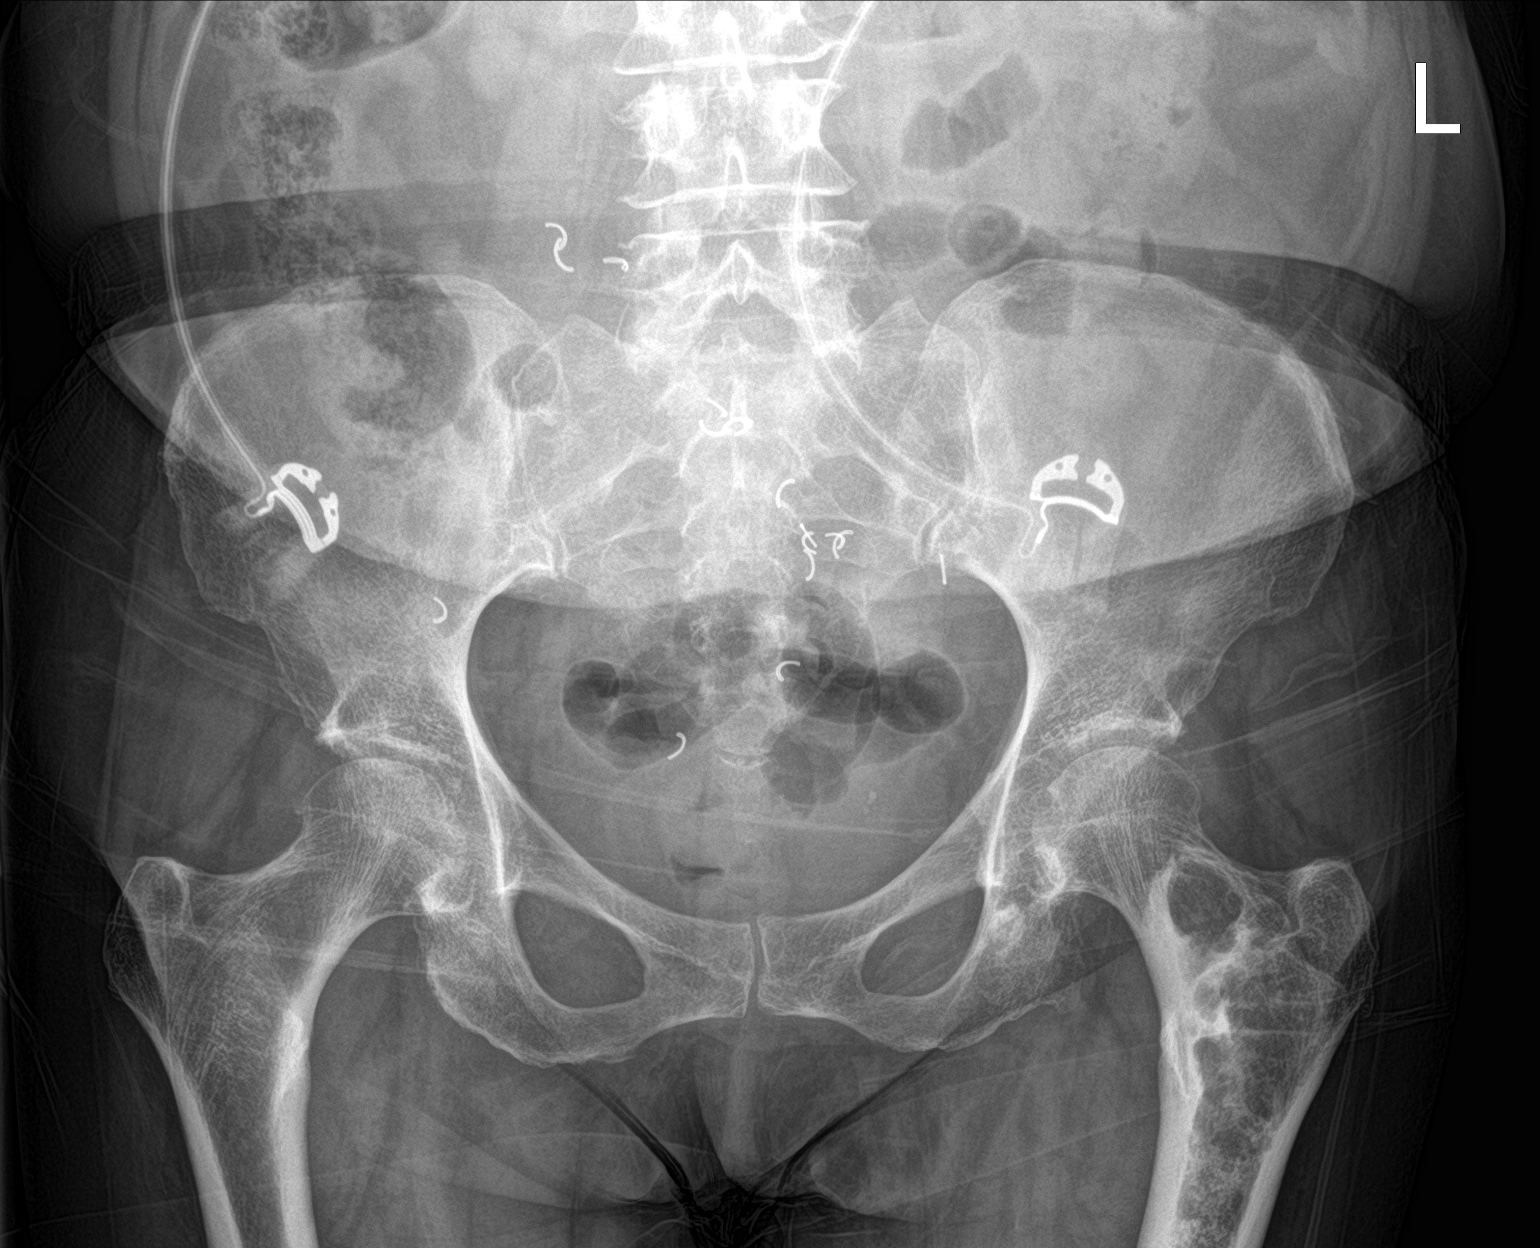

[hip ap]
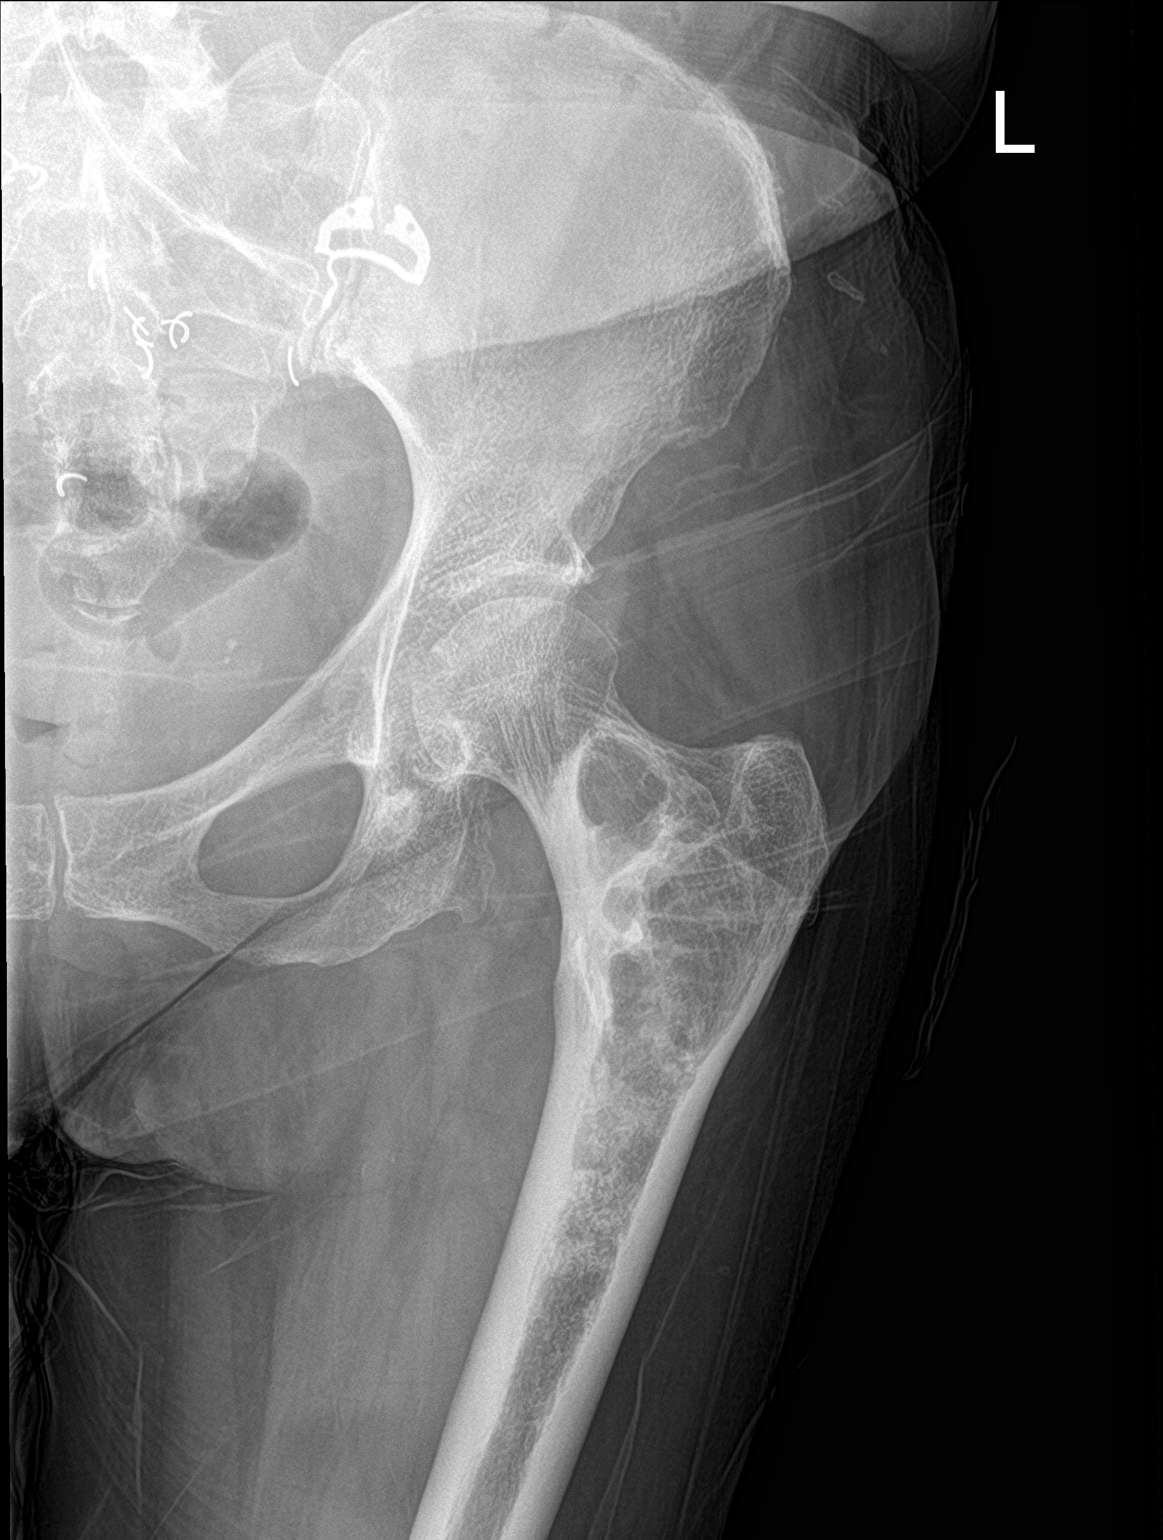

[hip lat]
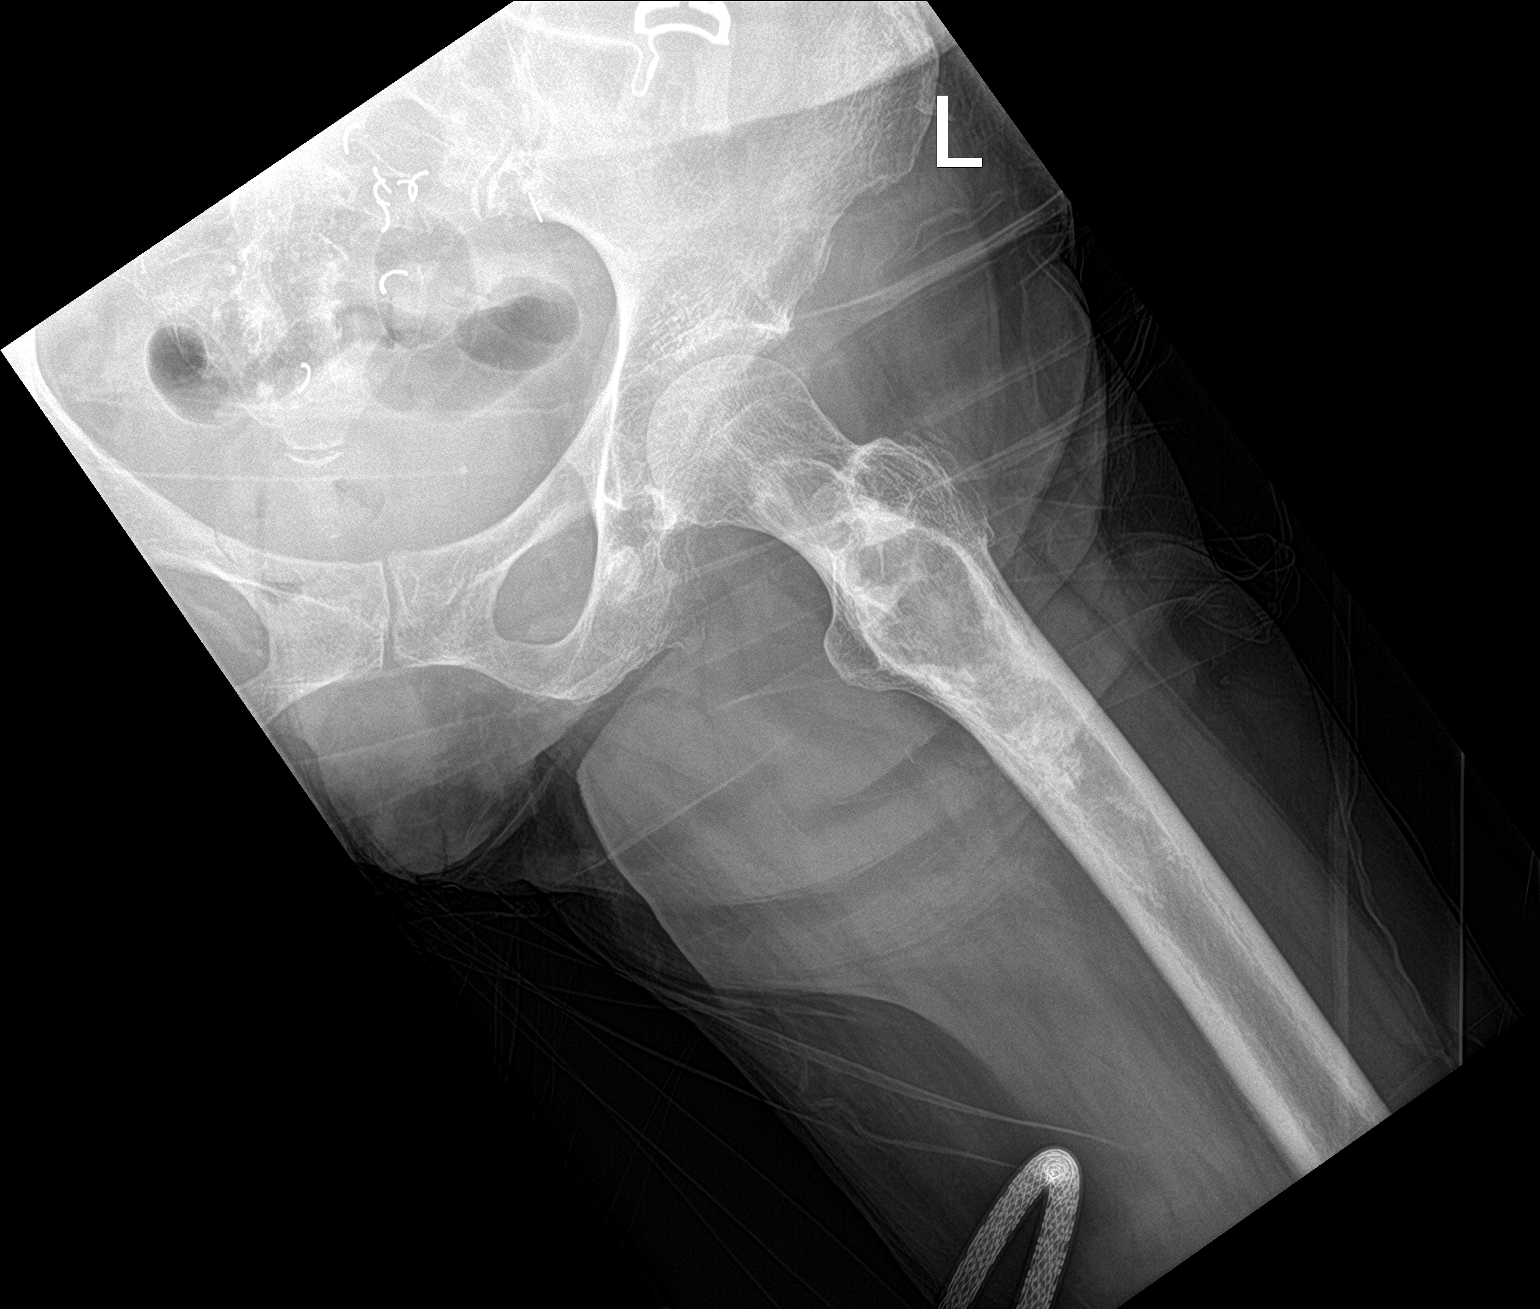

[3 of 3 positions shown; findings below may reference images not displayed]

FINDINGS: Both hips are normally located. No acute fracture is identified.
Chronic unchanged bone lesion involving the left femoral neck and
upper shaft most likely fibrous dysplasia. LSMFT is also possible.

The bony pelvis is intact. No pelvic fractures. The pubic symphysis
and SI joints are intact.
IMPRESSION: 1. No acute bony findings.
2. Chronic bone lesion involving the left femoral neck and upper
shaft.

## 2023-09-11 ENCOUNTER — Ambulatory Visit: Payer: Medicare Other | Admitting: Gastroenterology

## 2024-02-06 ENCOUNTER — Encounter (HOSPITAL_COMMUNITY): Payer: Self-pay

## 2024-02-06 ENCOUNTER — Emergency Department (HOSPITAL_COMMUNITY)

## 2024-02-06 ENCOUNTER — Other Ambulatory Visit: Payer: Self-pay

## 2024-02-06 ENCOUNTER — Emergency Department (HOSPITAL_COMMUNITY)
Admission: EM | Admit: 2024-02-06 | Discharge: 2024-02-06 | Disposition: A | Discharge status: Home / Self Care | Attending: Emergency Medicine | Admitting: Emergency Medicine

## 2024-02-06 DIAGNOSIS — S0003XA Contusion of scalp, initial encounter: Secondary | ICD-10-CM | POA: Insufficient documentation

## 2024-02-06 DIAGNOSIS — W01198A Fall on same level from slipping, tripping and stumbling with subsequent striking against other object, initial encounter: Secondary | ICD-10-CM | POA: Insufficient documentation

## 2024-02-06 DIAGNOSIS — E119 Type 2 diabetes mellitus without complications: Secondary | ICD-10-CM | POA: Diagnosis not present

## 2024-02-06 DIAGNOSIS — W19XXXA Unspecified fall, initial encounter: Secondary | ICD-10-CM

## 2024-02-06 DIAGNOSIS — S8011XA Contusion of right lower leg, initial encounter: Secondary | ICD-10-CM | POA: Diagnosis not present

## 2024-02-06 DIAGNOSIS — Z7982 Long term (current) use of aspirin: Secondary | ICD-10-CM | POA: Diagnosis not present

## 2024-02-06 DIAGNOSIS — Z79899 Other long term (current) drug therapy: Secondary | ICD-10-CM | POA: Insufficient documentation

## 2024-02-06 DIAGNOSIS — Y92009 Unspecified place in unspecified non-institutional (private) residence as the place of occurrence of the external cause: Secondary | ICD-10-CM | POA: Diagnosis not present

## 2024-02-06 DIAGNOSIS — I10 Essential (primary) hypertension: Secondary | ICD-10-CM | POA: Diagnosis not present

## 2024-02-06 DIAGNOSIS — S0990XA Unspecified injury of head, initial encounter: Secondary | ICD-10-CM | POA: Diagnosis present

## 2024-02-06 NOTE — ED Notes (Signed)
 [facility] called

## 2024-02-06 NOTE — ED Provider Notes (Signed)
 [facility] Provider Note   CSN: [id number] Arrival date & time: 02/06/24  0013     Patient presents with: Fall   Angela Galvan is a 89 y.o. female.   The history is provided by the patient.  Fall   She has history of hypertension, diabetes, hyperlipidemia GERD and comes in following a fall at home.  She is wheelchair-bound and fell as she was transferring from wheelchair to bed.  She did hit her head and she suffered a bruise to her right lower leg although she does not remember hitting it.  She is not on any anticoagulants or antiplatelet agents.    Prior to Admission medications   Medication Sig Start Date End Date Taking? Authorizing Provider  acetaminophen  (TYLENOL ) 500 MG tablet Take 500 mg by mouth daily.    [provider]  albuterol  (VENTOLIN  HFA) 108 (90 Base) MCG/ACT inhaler INHALE 2 PUFFS EVERY 6 HOURS AS NEEDED FOR WHEEZING OR SHORTNESS OF BREATH Patient taking differently: Inhale 2 puffs into the lungs every 6 (six) hours as needed for wheezing or shortness of breath. 07/29/19   Kassie Acquanetta Bradley, MD  aspirin  EC 81 MG tablet Take 81 mg by mouth daily. Swallow whole.    [provider]  atorvastatin  (LIPITOR) 10 MG tablet Take 1 tablet (10 mg total) by mouth daily after breakfast. 05/05/19   Corum, Olam CROME, MD  B Complex Vitamins (B COMPLEX PO) Take 1 tablet by mouth daily. W/Biotin and folic acid    [provider]  benzonatate  (TESSALON ) 200 MG capsule benzonatate  200 mg capsule  Take 1 capsule every 12 hours by oral route for 10 days.    [provider]  busPIRone (BUSPAR) 5 MG tablet Take 5 mg by mouth 2 (two) times daily. 08/06/21   [provider]  celecoxib (CELEBREX) 200 MG capsule celecoxib 200 mg capsule    [provider]  Cholecalciferol (D3-1000) 25 MCG (1000 UT) capsule Take 1,000 Units by mouth daily.    [provider]  diclofenac Sodium (VOLTAREN) 1 %  GEL Apply topically. 06/01/21   [provider]  ergocalciferol (VITAMIN D2) 1.25 MG (50000 UT) capsule     [provider]  gabapentin  (NEURONTIN ) 100 MG capsule Take by mouth. 07/21/21   [provider]  levothyroxine  (SYNTHROID ) 75 MCG tablet Take 1 tablet (75 mcg total) by mouth daily. 07/11/19   Corum, Olam CROME, MD  loperamide  (IMODIUM ) 1 MG/5ML solution     [provider]  loperamide  (IMODIUM ) 2 MG capsule Take 2 mg by mouth as needed for diarrhea or loose stools.    [provider]  loratadine (CLARITIN) 10 MG tablet Take 10 mg by mouth daily.    [provider]  losartan  (COZAAR ) 100 MG tablet Take 1 tablet (100 mg total) by mouth daily. 05/05/19   Corum, Olam CROME, MD  RABEprazole  (ACIPHEX ) 20 MG tablet TAKE 1 TABLET BY MOUTH 2 TIMES DAILY Patient taking differently: Take 20 mg by mouth in the morning and at bedtime. 12/26/18   Ezzard Sonny RAMAN, PA-C  RESTASIS  0.05 % ophthalmic emulsion Place 1 drop into both eyes 2 (two) times daily.  08/21/19   [provider]  sertraline (ZOLOFT) 100 MG tablet Take 100 mg by mouth daily.    [provider]  TRELEGY ELLIPTA 100-62.5-25 MCG/INH AEPB Inhale 1 puff into the lungs daily. 08/18/20   [provider]    Allergies:  Patient has no known allergies.    Review of Systems  All other systems reviewed and are negative.   Updated Vital Signs BP (!) 163/74   Pulse 80   Temp 98 F (36.7 C)   Resp 18   Ht 5' (1.524 m)   Wt 47.6 kg   SpO2 100%   BMI 20.51 kg/m   Physical Exam Vitals and nursing note reviewed.   89 year old female, resting comfortably and in no acute distress. Vital signs are significant for elevated blood pressure. Oxygen saturation is 100%, which is normal. Head is normocephalic and atraumatic. PERRLA, EOMI.  Neck is nontender. Back is nontender. Lungs are clear without rales, wheezes, or rhonchi. Chest is nontender. Heart has regular rate and  rhythm without murmur. Abdomen is soft, flat, nontender. Pelvis is stable and nontender. Extremities: Minor ecchymosis noted in the anterior aspect of right lower leg without swelling or tenderness.  Generalized increased muscle tone in legs but no swelling or deformity of any extremity and no pain on range of motion. Skin is warm and dry without rash. Neurologic: Awake and alert, cranial nerves are intact, moves all extremities equally.   Radiology: CT Head Wo Contrast Result Date: 02/06/2024 EXAM: CT HEAD AND CERVICAL SPINE 02/06/2024 12:47:15 AM TECHNIQUE: CT of the head and cervical spine was performed without the administration of intravenous contrast. Multiplanar reformatted images are provided for review. Automated exposure control, iterative reconstruction, and/or weight based adjustment of the mA/kV was utilized to reduce the radiation dose to as low as reasonably achievable. COMPARISON: 03/03/2023 CLINICAL HISTORY: Head trauma, minor (Age >= 65y). Fall, neck/head trauma, bruise to right side of head. FINDINGS: CT HEAD BRAIN AND VENTRICLES: No acute intracranial hemorrhage. No mass effect or midline shift. No abnormal extra-axial fluid collection. No evidence of acute infarct. No hydrocephalus. Chronic microvascular ischemia and generalized atrophy. ORBITS: No acute abnormality. SINUSES AND MASTOIDS: No acute abnormality. SOFT TISSUES AND SKULL: No acute skull fracture. Right lateral scalp contusion. CT CERVICAL SPINE BONES AND ALIGNMENT: Grade 1 anterolisthesis (4 mm) of C5 has increased compared to 03/03/23. This is presumed chronic, though acute traumatic malalignment is not excluded. DEGENERATIVE CHANGES: Multilevel facet arthropathy. No severe spinal canal narrowing. SOFT TISSUES: No prevertebral soft tissue swelling. IMPRESSION: 1. No acute intracranial abnormality. 2. Increased grade 1 anterolisthesis (4 mm) of C5 compared to 03/03/23, presumed chronic, though acute traumatic malalignment  is not excluded. 3. Right lateral scalp contusion without calvarial fracture. Electronically signed by: Norman Gatlin MD 02/06/2024 01:13 AM EDT RP Workstation: [id number]   CT Cervical Spine Wo Contrast Result Date: 02/06/2024 EXAM: CT HEAD AND CERVICAL SPINE 02/06/2024 12:47:15 AM TECHNIQUE: CT of the head and cervical spine was performed without the administration of intravenous contrast. Multiplanar reformatted images are provided for review. Automated exposure control, iterative reconstruction, and/or weight based adjustment of the mA/kV was utilized to reduce the radiation dose to as low as reasonably achievable. COMPARISON: 03/03/2023 CLINICAL HISTORY: Head trauma, minor (Age >= 65y). Fall, neck/head trauma, bruise to right side of head. FINDINGS: CT HEAD BRAIN AND VENTRICLES: No acute intracranial hemorrhage. No mass effect or midline shift. No abnormal extra-axial fluid collection. No evidence of acute infarct. No hydrocephalus. Chronic microvascular ischemia and generalized atrophy. ORBITS: No acute abnormality. SINUSES AND MASTOIDS: No acute abnormality. SOFT TISSUES AND SKULL: No acute skull fracture. Right lateral scalp contusion. CT CERVICAL SPINE BONES AND ALIGNMENT: Grade 1 anterolisthesis (4 mm) of C5 has increased compared to 03/03/23. This is presumed  chronic, though acute traumatic malalignment is not excluded. DEGENERATIVE CHANGES: Multilevel facet arthropathy. No severe spinal canal narrowing. SOFT TISSUES: No prevertebral soft tissue swelling. IMPRESSION: 1. No acute intracranial abnormality. 2. Increased grade 1 anterolisthesis (4 mm) of C5 compared to 03/03/23, presumed chronic, though acute traumatic malalignment is not excluded. 3. Right lateral scalp contusion without calvarial fracture. Electronically signed by: Norman Gatlin MD 02/06/2024 01:13 AM EDT RP Workstation: [id number]     Procedures   Medications Ordered in the ED - No data to display                                   Medical Decision Making Amount and/or Complexity of Data Reviewed Radiology: ordered.   Fall with history of hitting head and minor ecchymosis of the right lower leg.  I have ordered CT scans of head and cervical spine.  I have reviewed her past records, and notes several ED visits for falls-most recently on 03/03/2023.  CT scans showed no acute intracranial abnormality, increased grade 1 anterolisthesis of C5 compared with prior felt to be chronic.  I have independently reviewed the images, and agree with the radiologist's interpretation.  She has no tenderness in that region, no indication of acute traumatic injury.  I am discharging her with instructions to follow-up with PCP as needed.     Final diagnoses:  Fall at home, initial encounter  Contusion of scalp, initial encounter  Contusion of right lower leg, initial encounter    ED Discharge Orders     None          Raford Lenis, MD 02/06/24 [phone]

## 2024-02-06 NOTE — ED Triage Notes (Signed)
 PT was brought in by [facility], PT is from [facility], PT had a fall while tring to move from bed to wheelchair and has a hematoma on the right side of her head. PT nursing home states she is not on blood thinner. PT VSS and A&OX4. PT states pain is 7/10.

## 2024-02-06 NOTE — Discharge Instructions (Signed)
 Your evaluation did not show any serious problems, but there is a slight slipping of bones in your neck.  At any point, if you start having more weakness or neck pain, he may need to be evaluated by a neurosurgeon.
